# Patient Record
Sex: Male | Born: 1965
Health system: Southern US, Community
[De-identification: ages and names within clinical notes are randomized; demographics above are authoritative.]

## PROBLEM LIST (undated history)

## (undated) DIAGNOSIS — E66812 Obesity, class 2: Secondary | ICD-10-CM

## (undated) DIAGNOSIS — K429 Umbilical hernia without obstruction or gangrene: Secondary | ICD-10-CM

## (undated) DIAGNOSIS — H269 Unspecified cataract: Secondary | ICD-10-CM

## (undated) DIAGNOSIS — M1990 Unspecified osteoarthritis, unspecified site: Secondary | ICD-10-CM

## (undated) DIAGNOSIS — F329 Major depressive disorder, single episode, unspecified: Secondary | ICD-10-CM

## (undated) DIAGNOSIS — Z8489 Family history of other specified conditions: Secondary | ICD-10-CM

## (undated) DIAGNOSIS — F32A Depression, unspecified: Secondary | ICD-10-CM

## (undated) DIAGNOSIS — E7211 Homocystinuria: Secondary | ICD-10-CM

## (undated) DIAGNOSIS — H279 Unspecified disorder of lens: Secondary | ICD-10-CM

## (undated) DIAGNOSIS — M722 Plantar fascial fibromatosis: Secondary | ICD-10-CM

## (undated) DIAGNOSIS — M8700 Idiopathic aseptic necrosis of unspecified bone: Secondary | ICD-10-CM

## (undated) DIAGNOSIS — R202 Paresthesia of skin: Secondary | ICD-10-CM

## (undated) DIAGNOSIS — R0902 Hypoxemia: Secondary | ICD-10-CM

## (undated) DIAGNOSIS — F429 Obsessive-compulsive disorder, unspecified: Secondary | ICD-10-CM

## (undated) DIAGNOSIS — I639 Cerebral infarction, unspecified: Secondary | ICD-10-CM

## (undated) DIAGNOSIS — G709 Myoneural disorder, unspecified: Secondary | ICD-10-CM

## (undated) DIAGNOSIS — I10 Essential (primary) hypertension: Secondary | ICD-10-CM

## (undated) DIAGNOSIS — K219 Gastro-esophageal reflux disease without esophagitis: Secondary | ICD-10-CM

## (undated) DIAGNOSIS — G4730 Sleep apnea, unspecified: Secondary | ICD-10-CM

## (undated) DIAGNOSIS — E669 Obesity, unspecified: Secondary | ICD-10-CM

## (undated) DIAGNOSIS — I619 Nontraumatic intracerebral hemorrhage, unspecified: Secondary | ICD-10-CM

## (undated) DIAGNOSIS — F419 Anxiety disorder, unspecified: Secondary | ICD-10-CM

## (undated) DIAGNOSIS — G603 Idiopathic progressive neuropathy: Secondary | ICD-10-CM

## (undated) HISTORY — PX: JOINT REPLACEMENT: SHX530

## (undated) HISTORY — DX: Plantar fascial fibromatosis: M72.2

## (undated) HISTORY — PX: WRIST SURGERY: SHX841

## (undated) HISTORY — DX: Idiopathic progressive neuropathy: G60.3

## (undated) HISTORY — PX: DERMABRASION OF FACE: SUR411

## (undated) HISTORY — DX: Paresthesia of skin: R20.2

## (undated) HISTORY — DX: Idiopathic aseptic necrosis of unspecified bone: M87.00

## (undated) HISTORY — DX: Unspecified cataract: H26.9

## (undated) HISTORY — DX: Hypoxemia: R09.02

## (undated) HISTORY — DX: Obesity, class 2: E66.812

## (undated) HISTORY — PX: WRIST FUSION: SHX839

## (undated) HISTORY — PX: EYE SURGERY: SHX253

## (undated) HISTORY — PX: INTRAOCULAR LENS INSERTION: SHX110

## (undated) HISTORY — PX: HIP ARTHROPLASTY: SHX981

## (undated) HISTORY — DX: Obesity, unspecified: E66.9

---

## 1998-08-03 ENCOUNTER — Encounter: Admission: RE | Admit: 1998-08-03 | Discharge: 1998-08-03 | Payer: Self-pay | Admitting: *Deleted

## 2003-12-31 ENCOUNTER — Encounter: Admission: RE | Admit: 2003-12-31 | Discharge: 2003-12-31 | Payer: Self-pay | Admitting: Internal Medicine

## 2005-01-12 ENCOUNTER — Encounter: Admission: RE | Admit: 2005-01-12 | Discharge: 2005-01-12 | Payer: Self-pay | Admitting: Internal Medicine

## 2005-03-17 ENCOUNTER — Ambulatory Visit (HOSPITAL_COMMUNITY): Admission: RE | Admit: 2005-03-17 | Discharge: 2005-03-17 | Payer: Self-pay | Admitting: Internal Medicine

## 2005-03-23 ENCOUNTER — Ambulatory Visit (HOSPITAL_COMMUNITY): Admission: RE | Admit: 2005-03-23 | Discharge: 2005-03-23 | Payer: Self-pay | Admitting: Internal Medicine

## 2006-07-09 ENCOUNTER — Emergency Department (HOSPITAL_COMMUNITY): Admission: EM | Admit: 2006-07-09 | Discharge: 2006-07-09 | Payer: Self-pay | Admitting: Emergency Medicine

## 2007-01-12 ENCOUNTER — Emergency Department (HOSPITAL_COMMUNITY): Admission: EM | Admit: 2007-01-12 | Discharge: 2007-01-12 | Payer: Self-pay | Admitting: Family Medicine

## 2007-03-23 ENCOUNTER — Ambulatory Visit: Payer: Self-pay | Admitting: Internal Medicine

## 2007-04-19 ENCOUNTER — Ambulatory Visit: Payer: Self-pay | Admitting: Internal Medicine

## 2007-04-19 LAB — CONVERTED CEMR LAB
Albumin: 4.1 g/dL (ref 3.5–5.2)
Basophils Absolute: 0 10*3/uL (ref 0.0–0.1)
Bilirubin, Direct: 0.1 mg/dL (ref 0.0–0.3)
Chloride: 98 meq/L (ref 96–112)
Cholesterol: 187 mg/dL (ref 0–200)
Eosinophils Absolute: 0 10*3/uL (ref 0.0–0.6)
Eosinophils Relative: 1.1 % (ref 0.0–5.0)
GFR calc Af Amer: 106 mL/min
GFR calc non Af Amer: 88 mL/min
Glucose, Bld: 99 mg/dL (ref 70–99)
HCT: 43.3 % (ref 39.0–52.0)
Homocysteine: 30.1 micromoles/L — ABNORMAL HIGH (ref 5.00–13.90)
Lymphocytes Relative: 27.8 % (ref 12.0–46.0)
MCHC: 34.3 g/dL (ref 30.0–36.0)
MCV: 92.6 fL (ref 78.0–100.0)
Monocytes Absolute: 0.4 10*3/uL (ref 0.2–0.7)
Neutro Abs: 2.8 10*3/uL (ref 1.4–7.7)
Neutrophils Relative %: 61.9 % (ref 43.0–77.0)
Potassium: 4.1 meq/L (ref 3.5–5.1)
RBC: 4.68 M/uL (ref 4.22–5.81)
Sodium: 134 meq/L — ABNORMAL LOW (ref 135–145)
TSH: 0.75 microintl units/mL (ref 0.35–5.50)
Total CHOL/HDL Ratio: 4.7

## 2007-11-14 ENCOUNTER — Emergency Department (HOSPITAL_COMMUNITY): Admission: EM | Admit: 2007-11-14 | Discharge: 2007-11-14 | Payer: Self-pay | Admitting: Emergency Medicine

## 2008-04-10 ENCOUNTER — Telehealth (INDEPENDENT_AMBULATORY_CARE_PROVIDER_SITE_OTHER): Payer: Self-pay | Admitting: *Deleted

## 2008-05-19 ENCOUNTER — Emergency Department: Payer: Self-pay | Admitting: Emergency Medicine

## 2008-05-28 ENCOUNTER — Ambulatory Visit: Payer: Self-pay | Admitting: Internal Medicine

## 2008-05-28 ENCOUNTER — Telehealth: Payer: Self-pay | Admitting: Gastroenterology

## 2008-05-28 DIAGNOSIS — F411 Generalized anxiety disorder: Secondary | ICD-10-CM | POA: Insufficient documentation

## 2008-05-28 DIAGNOSIS — F429 Obsessive-compulsive disorder, unspecified: Secondary | ICD-10-CM | POA: Insufficient documentation

## 2008-05-28 DIAGNOSIS — E785 Hyperlipidemia, unspecified: Secondary | ICD-10-CM | POA: Insufficient documentation

## 2008-05-28 DIAGNOSIS — E7210 Disorders of sulfur-bearing amino-acid metabolism, unspecified: Secondary | ICD-10-CM | POA: Insufficient documentation

## 2008-05-28 DIAGNOSIS — E782 Mixed hyperlipidemia: Secondary | ICD-10-CM | POA: Insufficient documentation

## 2008-05-29 ENCOUNTER — Telehealth (INDEPENDENT_AMBULATORY_CARE_PROVIDER_SITE_OTHER): Payer: Self-pay | Admitting: *Deleted

## 2008-05-30 ENCOUNTER — Encounter: Payer: Self-pay | Admitting: Internal Medicine

## 2008-05-30 ENCOUNTER — Encounter (INDEPENDENT_AMBULATORY_CARE_PROVIDER_SITE_OTHER): Payer: Self-pay | Admitting: *Deleted

## 2008-05-30 LAB — CONVERTED CEMR LAB
BUN: 10 mg/dL (ref 6–23)
Basophils Absolute: 0 10*3/uL (ref 0.0–0.1)
Basophils Relative: 0.2 % (ref 0.0–1.0)
CO2: 31 meq/L (ref 19–32)
Calcium: 9.7 mg/dL (ref 8.4–10.5)
Chloride: 102 meq/L (ref 96–112)
Cholesterol: 165 mg/dL (ref 0–200)
Creatinine, Ser: 1.1 mg/dL (ref 0.4–1.5)
Eosinophils Absolute: 0 10*3/uL (ref 0.0–0.7)
Eosinophils Relative: 0.5 % (ref 0.0–5.0)
GFR calc non Af Amer: 78 mL/min
HDL: 47.3 mg/dL (ref >39.0)
Hemoglobin: 14.8 g/dL (ref 13.0–17.0)
MCHC: 35.6 g/dL (ref 30.0–36.0)
MCV: 93.1 fL (ref 78.0–100.0)
Neutro Abs: 3.5 10*3/uL (ref 1.4–7.7)
Neutrophils Relative %: 64.3 % (ref 43.0–77.0)
RBC: 4.47 M/uL (ref 4.22–5.81)
Total CHOL/HDL Ratio: 3.5
Triglycerides: 84 mg/dL (ref 0–149)
WBC: 5.4 10*3/uL (ref 4.5–10.5)

## 2008-06-03 ENCOUNTER — Encounter: Payer: Self-pay | Admitting: Physician Assistant

## 2008-06-03 ENCOUNTER — Ambulatory Visit: Payer: Self-pay | Admitting: Gastroenterology

## 2008-06-03 ENCOUNTER — Telehealth (INDEPENDENT_AMBULATORY_CARE_PROVIDER_SITE_OTHER): Payer: Self-pay | Admitting: *Deleted

## 2008-06-09 ENCOUNTER — Encounter (INDEPENDENT_AMBULATORY_CARE_PROVIDER_SITE_OTHER): Payer: Self-pay | Admitting: *Deleted

## 2008-06-09 ENCOUNTER — Telehealth: Payer: Self-pay | Admitting: Gastroenterology

## 2008-06-12 LAB — CONVERTED CEMR LAB: Tissue Transglutaminase Ab, IgA: 0.9 units (ref <7)

## 2008-06-23 ENCOUNTER — Ambulatory Visit: Payer: Self-pay | Admitting: Gastroenterology

## 2008-06-23 ENCOUNTER — Encounter: Payer: Self-pay | Admitting: Gastroenterology

## 2008-06-25 ENCOUNTER — Encounter: Payer: Self-pay | Admitting: Gastroenterology

## 2008-06-26 ENCOUNTER — Telehealth: Payer: Self-pay | Admitting: Gastroenterology

## 2008-10-07 ENCOUNTER — Telehealth (INDEPENDENT_AMBULATORY_CARE_PROVIDER_SITE_OTHER): Payer: Self-pay | Admitting: *Deleted

## 2009-07-03 DIAGNOSIS — F329 Major depressive disorder, single episode, unspecified: Secondary | ICD-10-CM | POA: Insufficient documentation

## 2009-12-05 HISTORY — PX: OTHER SURGICAL HISTORY: SHX169

## 2010-12-05 HISTORY — PX: UPPER GASTROINTESTINAL ENDOSCOPY: SHX188

## 2011-04-22 NOTE — Assessment & Plan Note (Signed)
[facility]                        [place] OFFICE NOTE   ABDOULAYE, DRUM                          MRN:          [id number]  DATE:03/23/2007                            DOB:          1966-08-09    Mr. Bazzi was seen to establish as a new patient, March 23, 2007.   He has a very complex past medical history; specifically, he was  diagnosed with homocystinuria in 1996, at the time of a cerebrovascular  event.  In 1992, he had bilateral lens implants.  At age 45, due to the  ophthalmologic issues, Marfan's was questioned, but subsequently  disproved.  Chanetta Marshall is followed by Maryjo Rochester, M.D., [facility] [place].  He has also been evaluated  by Lesia Sago, a neurologist.   He also has a history of dyslipidemia, is on Lipitor.   FAMILY HISTORY:  Includes lung cancer in paternal grandfather; he was a  smoker and also had a stroke.  Father and mother both had hypertension,  as did paternal grandmother and maternal grandmother.  Both grandmothers  had strokes, as well.   He has never smoked and drinks alcohol minimally.  He has joined an  exercise facility.  He is on a low-protein diet, but admits that his  compliance is suboptimal.   Other medications include:  1. Betaine 21 g daily.  2. B6 500 mg daily.  3. B12 100 micrograms daily.  4. Folate 1 mg daily.  5. Baby aspirin.  6. Celexa 40.  7. Lipitor 10; he has been out for four days.   He has GI intolerance to AMOXICILLIN.   REVIEW OF SYSTEMS:  Essentially negative.  Specifically, he has no  paresthesias, weakness or other neurologic symptoms at this time.   He states that his lipids have been well-controlled on low-dose Lipitor.   He has no cardiopulmonary symptoms with the exercise.   He is 6 feet 3 and weighs 240, fully clothed.  Pulse was 64, respiratory  rate 16 and blood pressure 110/80.  The lenses are clear.  He has mild  arteriolar narrowing.  There is dramatic deviation of the nasal septum  with almost total occlusion of the right naris.  Dental hygiene is good.  Otolaryngologic exam is otherwise unremarkable.   Thyroid is normal to palpation.   He has no murmurs or gallops and all pulses are intact.  Chest is clear  to auscultation.   He has no organomegaly or masses.   Genitourinary exam is negative.  Hemoccult testing is negative.   He has mild crepitus of his knees.  I can appreciate no musculoskeletal  abnormalities, otherwise.  He has no hyperextensibility.   Neuropsychiatric exam reveals no deficits.   His EKG reveals early repolarization changes.  Unfortunately, the prior  EKGs reveal low voltage and are difficult to compare.   If he has no difficulty with cardiovascular exercise 30-45 minutes three  to four times a week, we will simply monitor the EKG annually.  Should  he have any symptoms, then a nuclear stress test would  be pursued.   The Lipitor will be renewed and fasting labs checked, once he has been  back on the medication for at least six weeks, for optimalassessment. of  the drug's efficacy.   A copy of this will be sent to Dr. Kandis Cocking at [facility], [facility] [address] number [id number],  [place] [address] (phone number [phone]/fax [phone]).     Titus Dubin. Alwyn Ren, MD,FACP,FCCP  Electronically Signed    [facility]  DD: 03/23/2007  DT: 03/23/2007  Job #: [id number]   cc:   Clinic, [place], [place] [address] Dr. Maryjo [facility]

## 2013-05-15 ENCOUNTER — Emergency Department: Payer: Self-pay

## 2013-05-15 LAB — URINALYSIS, COMPLETE
Blood: NEGATIVE
Ketone: NEGATIVE
Ph: 8 (ref 4.5–8.0)
Protein: NEGATIVE
RBC,UR: NONE SEEN /HPF (ref 0–5)
Squamous Epithelial: NONE SEEN
WBC UR: 1 /HPF (ref 0–5)

## 2013-05-15 LAB — COMPREHENSIVE METABOLIC PANEL
Albumin: 3.7 g/dL (ref 3.4–5.0)
Alkaline Phosphatase: 48 U/L — ABNORMAL LOW (ref 50–136)
Creatinine: 1.03 mg/dL (ref 0.60–1.30)
EGFR (African American): >60
EGFR (Non-African Amer.): >60
SGOT(AST): 39 U/L — ABNORMAL HIGH (ref 15–37)
SGPT (ALT): 56 U/L (ref 12–78)
Total Protein: 8.3 g/dL — ABNORMAL HIGH (ref 6.4–8.2)

## 2013-05-15 LAB — CBC
HCT: 43.5 % (ref 40.0–52.0)
HGB: 15.1 g/dL (ref 13.0–18.0)
MCH: 31.9 pg (ref 26.0–34.0)
MCV: 92 fL (ref 80–100)
Platelet: 210 10*3/uL (ref 150–440)

## 2013-05-15 LAB — TSH: Thyroid Stimulating Horm: 0.979 u[IU]/mL

## 2013-05-22 ENCOUNTER — Encounter (HOSPITAL_COMMUNITY): Payer: Self-pay | Admitting: *Deleted

## 2013-05-22 DIAGNOSIS — M545 Low back pain, unspecified: Secondary | ICD-10-CM | POA: Insufficient documentation

## 2013-05-22 DIAGNOSIS — R209 Unspecified disturbances of skin sensation: Secondary | ICD-10-CM | POA: Insufficient documentation

## 2013-05-22 DIAGNOSIS — Z8669 Personal history of other diseases of the nervous system and sense organs: Secondary | ICD-10-CM | POA: Insufficient documentation

## 2013-05-22 DIAGNOSIS — IMO0002 Reserved for concepts with insufficient information to code with codable children: Secondary | ICD-10-CM | POA: Insufficient documentation

## 2013-05-22 DIAGNOSIS — G8929 Other chronic pain: Secondary | ICD-10-CM | POA: Insufficient documentation

## 2013-05-22 DIAGNOSIS — R5381 Other malaise: Secondary | ICD-10-CM | POA: Insufficient documentation

## 2013-05-22 DIAGNOSIS — Z8679 Personal history of other diseases of the circulatory system: Secondary | ICD-10-CM | POA: Insufficient documentation

## 2013-05-22 NOTE — ED Notes (Signed)
The pt has had back pain for 3 weeks he has seen a doctor but the percocet and the valium is not helping.  He also reports pain  Going down his rt leg and he has had some bowel problems

## 2013-05-23 ENCOUNTER — Emergency Department (HOSPITAL_COMMUNITY): Payer: Self-pay

## 2013-05-23 ENCOUNTER — Emergency Department (HOSPITAL_COMMUNITY)
Admission: EM | Admit: 2013-05-23 | Discharge: 2013-05-23 | Disposition: A | Discharge status: Home / Self Care | Payer: Self-pay | Attending: Emergency Medicine | Admitting: Emergency Medicine

## 2013-05-23 DIAGNOSIS — M5410 Radiculopathy, site unspecified: Secondary | ICD-10-CM

## 2013-05-23 DIAGNOSIS — G8929 Other chronic pain: Secondary | ICD-10-CM

## 2013-05-23 HISTORY — DX: Nontraumatic intracerebral hemorrhage, unspecified: I61.9

## 2013-05-23 HISTORY — DX: Unspecified disorder of lens: H27.9

## 2013-05-23 LAB — BASIC METABOLIC PANEL
BUN: 14 mg/dL (ref 6–23)
CO2: 27 mEq/L (ref 19–32)
Chloride: 104 mEq/L (ref 96–112)
Creatinine, Ser: 1.02 mg/dL (ref 0.50–1.35)
Glucose, Bld: 84 mg/dL (ref 70–99)

## 2013-05-23 LAB — CBC WITH DIFFERENTIAL/PLATELET
Eosinophils Relative: 2 % (ref 0–5)
HCT: 37.2 % — ABNORMAL LOW (ref 39.0–52.0)
Hemoglobin: 12.7 g/dL — ABNORMAL LOW (ref 13.0–17.0)
Lymphocytes Relative: 39 % (ref 12–46)
Lymphs Abs: 2.2 10*3/uL (ref 0.7–4.0)
MCV: 91.6 fL (ref 78.0–100.0)
Monocytes Absolute: 0.7 10*3/uL (ref 0.1–1.0)
Monocytes Relative: 13 % — ABNORMAL HIGH (ref 3–12)
Neutro Abs: 2.6 10*3/uL (ref 1.7–7.7)
WBC: 5.6 10*3/uL (ref 4.0–10.5)

## 2013-05-23 LAB — OCCULT BLOOD, POC DEVICE: Fecal Occult Bld: NEGATIVE

## 2013-05-23 MED ORDER — HYDROMORPHONE HCL PF 1 MG/ML IJ SOLN
1.0000 mg | Freq: Once | INTRAMUSCULAR | Status: AC
  Administered 2013-05-23: 1 mg via INTRAVENOUS
  Filled 2013-05-23: qty 1

## 2013-05-23 MED ORDER — DIAZEPAM 5 MG PO TABS: 5.0000 mg | ORAL_TABLET | Freq: Two times a day (BID) | ORAL | Status: DC

## 2013-05-23 MED ORDER — GADOBENATE DIMEGLUMINE 529 MG/ML IV SOLN
20.0000 mL | Freq: Once | INTRAVENOUS | Status: AC
  Administered 2013-05-23: 20 mL via INTRAVENOUS

## 2013-05-23 MED ORDER — OXYCODONE-ACETAMINOPHEN 5-325 MG PO TABS: 1.0000 | ORAL_TABLET | ORAL | Status: DC | PRN

## 2013-05-23 MED ORDER — SODIUM CHLORIDE 0.9 % IV BOLUS (SEPSIS)
1000.0000 mL | Freq: Once | INTRAVENOUS | Status: AC
  Administered 2013-05-23: 1000 mL via INTRAVENOUS

## 2013-05-23 MED ORDER — KETAMINE HCL 10 MG/ML IJ SOLN
11.0000 mg | Freq: Once | INTRAMUSCULAR | Status: AC
  Administered 2013-05-23: 11 mg via INTRAVENOUS
  Filled 2013-05-23: qty 1.1

## 2013-05-23 MED ORDER — SODIUM CHLORIDE 0.9 % IV BOLUS (SEPSIS): 1000.0000 mL | Freq: Once | INTRAVENOUS | Status: DC

## 2013-05-23 MED ORDER — DIAZEPAM 5 MG/ML IJ SOLN
2.5000 mg | Freq: Once | INTRAMUSCULAR | Status: AC
  Administered 2013-05-23: 2.5 mg via INTRAVENOUS
  Filled 2013-05-23: qty 2

## 2013-05-23 MED ORDER — ONDANSETRON HCL 4 MG/2ML IJ SOLN
4.0000 mg | Freq: Once | INTRAMUSCULAR | Status: AC
  Administered 2013-05-23: 4 mg via INTRAVENOUS
  Filled 2013-05-23: qty 2

## 2013-05-23 NOTE — ED Provider Notes (Signed)
History     CSN: [id number]  Arrival date & time 05/22/13  2318   First MD Initiated Contact with Patient 05/23/13 0012      Chief Complaint  Patient presents with  . Back Pain   HPI MATYAS BAISLEY is a 47 y.o. male with a past medical history significant for low back pain who informs me that 15-20 years ago he had an MRI showing some S2 nerve damage, presents to the ER with worsening low back pain. About 3 weeks ago he presented to the ER with back pain is not exacerbated by any injury, no trauma, he says his pain is in the right lower back, radiates down the right leg to his heel, has been associated with some weakness numbness and tingling of the right foot, and is also concerned because he's been "dribbling" urine before, denies any urinary retention, or fecal incontinence. Denies saddle anesthesia. Pain is currently an 8-9/10, aching.  Patient says he does have some chronic diarrhea from the medicine he takes for his homocystinuria. Patient has no history of prostate problems at present, no family history of prostate problems. Patient has had no fevers, chills, denies illicit drugs, alcohol, denies IV drug use no night sweats, or recent weight loss. Patient is had no relief with Percocet and Valium this evening.   Past Medical History  Diagnosis Date  . Lens disease   . Brain bleed     History reviewed. No pertinent past surgical history.  No family history on file.  History  Substance Use Topics  . Smoking status: Never Smoker   . Smokeless tobacco: Not on file  . Alcohol Use: No      Review of Systems At least 10pt or greater review of systems completed and are negative except where specified in the HPI.  Allergies  Pineapple and Amoxicillin  Home Medications  No current outpatient prescriptions on file.  BP 133/74  Pulse 135  Temp(Src) 98.4 F (36.9 C) (Oral)  Resp 95  SpO2 95%  Physical Exam  PHYSICAL EXAM: VITAL SIGNS:  . Filed Vitals:   05/22/13 2323   BP: 133/74  Pulse: 135  Temp: 98.4 F (36.9 C)  TempSrc: Oral  Resp: 95  SpO2: 95%   CONSTITUTIONAL: Awake, oriented, appears non-toxic HENT: Atraumatic, normocephalic, oral mucosa pink and moist, airway patent. Nares patent without drainage. External ears normal. EYES: Conjunctiva clear, EOMI, PERRLA NECK: Trachea midline, non-tender, supple CARDIOVASCULAR: Normal heart rate, Normal rhythm, No murmurs, rubs, gallops PULMONARY/CHEST: Clear to auscultation, no rhonchi, wheezes, or rales. Symmetrical breath sounds. CHEST WALL: No lesions. Non-tender. ABDOMINAL: Non-distended, soft, non-tender - no rebound or guarding.  BS normal. RECTAL: Normal sphincter tone, no gross blood, prostate enlarged without nodules or tenderness - could not appreciate the anterior third of the prostate secondary to body habitus. BACK: No step-offs or deformities, nontender to palpation in the midline, no skin abnormalities. NEUROLOGIC: JY:NWGNFA fields intact. PERRLA, EOMI.  Facial sensation equal to light touch bilaterally.  Good muscle bulk in the masseter muscle and good lateral movement of the jaw.  Facial expressions equal and good strength with smile/frown and puffed cheeks.  Hearing grossly intact to finger rub test.  Uvula, tongue are midline with no deviation. Symmetrical palate elevation.  Trapezius and SCM muscles are 5/5 strength bilaterally.   DTR: Brachioradialis, biceps, patellar, Achilles tendon reflexes 2+ bilaterally.  2 beats clonus bilaterally. Strength: 5/5 strength flexors and extensors in the upper and lower extremities.  Grip strength, finger  adduction/abduction 5/5. Sensation: Sensation intact distally to light touch Gait and Station: Normal heel/toe, and tandem gait.  Negative Romberg, no pronator drift EXTREMITIES: No clubbing, cyanosis, or edema SKIN: Warm, Dry, No erythema, No rash   ED Course  Procedures (including critical care time)  Labs Reviewed  CBC WITH DIFFERENTIAL -  Abnormal; Notable for the following:    RBC 4.06 (*)    Hemoglobin 12.7 (*)    HCT 37.2 (*)    Monocytes Relative 13 (*)    All other components within normal limits  BASIC METABOLIC PANEL - Abnormal; Notable for the following:    Potassium 3.3 (*)    Calcium 8.0 (*)    GFR calc non Af Amer 86 (*)    All other components within normal limits  OCCULT BLOOD, POC DEVICE   Dg Lumbar Spine 2-3 Views  05/23/2013   *RADIOLOGY REPORT*  Clinical Data: Low back pain.  LUMBAR SPINE - 2-3 VIEW  Comparison: Lumbar spine MRI 01/12/2005.  Findings: Three views of the lumbar spine demonstrate no definite acute displaced fracture or compression type fracture.  Alignment is anatomic.  Multilevel degenerative disc disease and facet arthropathy, most severe at L5-S1.  IMPRESSION: 1.  No acute radiographic abnormality of the lumbar spine. 2.  Multilevel degenerative disc disease and lumbar spondylosis, most severe at L5-S1.   Original Report Authenticated By: Trudie Reed, M.D.   Mr Lumbar Spine W Wo Contrast  05/23/2013   *RADIOLOGY REPORT*  Clinical Data: Low back and right leg pain with bladder symptoms.  MRI LUMBAR SPINE WITHOUT AND WITH CONTRAST  Technique:  Multiplanar and multiecho pulse sequences of the lumbar spine were obtained without and with intravenous contrast.  Contrast: 20mL MULTIHANCE GADOBENATE DIMEGLUMINE 529 MG/ML IV SOLN  Comparison: Lumbar MRI January 12, 2005.  Lumbar spine radiographs 05/23/2013.  Findings: There is chronic disc and endplate degeneration at L5-S1 which appears mildly progressive.  There is no evidence of acute fracture or pars defect.  The alignment is anatomic.  The conus medullaris extends to the L2 level and appears normal. There is no abnormal intradural enhancement.  There are no paraspinal abnormalities.  There is minimal disc desiccation at the L1-L2 and L2-L3 levels. However, from T12-T1 through L3-L4, there is no disc herniation, spinal stenosis or nerve root  encroachment.  L4-L5:  Mild annular disc bulging is stable.  There is mildly progressive facet and ligamentous hypertrophy with small bilateral facet joint effusions.  Minimal narrowing of the lateral recesses is stable.  There is no foraminal compromise or exiting nerve root encroachment.  L5-S1: There is chronic degenerative disc disease with progressive loss of disc height, annular bulging and paraspinal osteophytes. Mild facet and ligamentous hypertrophy bilaterally is unchanged. Likewise, the mild right and moderate left foraminal stenosis is similar to the prior examination.  There is possible encroachment on either exiting L5 nerve root.  No S1 nerve root displacement is identified.  IMPRESSION:  1.  Mildly progressive chronic degenerative disc disease at L5-S1 with associated chronic left greater than right foraminal stenosis. There is possible chronic L5 nerve root encroachment. 2.  No other significant spinal stenosis or nerve root encroachment. 3.  No acute findings are identified to explain the patient's radicular symptoms. 4.  Mildly progressive facet joint effusions bilaterally at L4-L5.   Original Report Authenticated By: Carey Bullocks, M.D.     1. Chronic back pain   2. Radiculopathy of leg       MDM  Physical exam  reveals no deficit. Patient's straight leg test is negative bilaterally. When I asked this patient to turn on his side for a rectal exam to check his rectal tone and prostate, patient promptly flipped around very quickly onto his abdomen from his back. With the EEGs that this patient flipped over and lack of discomfort, I question the possibility that this could be or a quiet syndrome. Patient's urinary dribbling seems likely to be caused by his enlarged prostate. Patient has no hyperreflexia in the right lower extremity, he has no weakness in the right lower extremity and a negative straight leg test in the right leg.  Patient is still having pain despite medication, he  still concerned there is something going on in his back, x-rays show multilevel degenerative disc disease and lumbar spondylosis most severe at L5-S1.  Is difficult to discern with the patient's urinary dribbling whether or not he actually has cord compression at this time or not. His physical exam is not convincing however the patient's complaints of weakness of the right leg are concerning since they're new. We'll place the patient in pod C to await urgent MRI done tomorrow morning.  Patient's pain has been well-controlled all evening, he received another dose of medication in the morning. Patient's MRI returned with no acute findings to explain patient's radicular symptoms, specifically no nerve root encroachment or significant spinal stenosis. I. think this correlates well with the patient's physical exam, and my clinical gestalt patient does not have a cauda equina syndrome or compression syndrome.  Think the patient's dribbling is likely due to same BPH. I've referred the patient back to his primary care physician to discuss therapy for that. I told him if his back pain continues after he sees his primary care physician I would also refer him to Dr. Venetia Maxon.  No evidence for infection, osteomyelitis, epidural abscess.  At this point patient likely has an acute exacerbation of chronic back pain, we'll extend his pain medications, he can return to his primary care physician for assessment. Also instructed him that he will need to maintain activity levels, and that physical therapy may help him out.         Jones Skene, MD 05/24/13 9528

## 2013-05-23 NOTE — ED Notes (Signed)
MD at bedside. 

## 2013-05-23 NOTE — ED Notes (Signed)
Report received, assumed care.  

## 2013-05-26 ENCOUNTER — Emergency Department: Payer: Self-pay | Admitting: Emergency Medicine

## 2013-06-12 ENCOUNTER — Encounter (HOSPITAL_COMMUNITY): Payer: Self-pay | Admitting: Emergency Medicine

## 2013-06-12 DIAGNOSIS — Z8669 Personal history of other diseases of the nervous system and sense organs: Secondary | ICD-10-CM | POA: Insufficient documentation

## 2013-06-12 DIAGNOSIS — M545 Low back pain, unspecified: Secondary | ICD-10-CM | POA: Insufficient documentation

## 2013-06-12 DIAGNOSIS — Z79899 Other long term (current) drug therapy: Secondary | ICD-10-CM | POA: Insufficient documentation

## 2013-06-12 DIAGNOSIS — Z8679 Personal history of other diseases of the circulatory system: Secondary | ICD-10-CM | POA: Insufficient documentation

## 2013-06-12 DIAGNOSIS — G8929 Other chronic pain: Secondary | ICD-10-CM | POA: Insufficient documentation

## 2013-06-12 NOTE — ED Notes (Signed)
PT. REPORTS CHRONIC BACK PAIN RADIATING TO RIGHT LEG FOR 6 WEEKS , DENIES INJURY OR FALL , NO URINARY SYMPTOMS , AMBULATORY.

## 2013-06-13 ENCOUNTER — Emergency Department (HOSPITAL_COMMUNITY)
Admission: EM | Admit: 2013-06-13 | Discharge: 2013-06-13 | Disposition: A | Discharge status: Home / Self Care | Payer: Self-pay | Attending: Emergency Medicine | Admitting: Emergency Medicine

## 2013-06-13 DIAGNOSIS — M549 Dorsalgia, unspecified: Secondary | ICD-10-CM

## 2013-06-13 DIAGNOSIS — G8929 Other chronic pain: Secondary | ICD-10-CM

## 2013-06-13 MED ORDER — HYDROMORPHONE HCL PF 1 MG/ML IJ SOLN
1.0000 mg | Freq: Once | INTRAMUSCULAR | Status: AC
  Administered 2013-06-13: 1 mg via INTRAMUSCULAR
  Filled 2013-06-13: qty 1

## 2013-06-13 MED ORDER — DIAZEPAM 5 MG PO TABS
5.0000 mg | ORAL_TABLET | Freq: Once | ORAL | Status: AC
  Administered 2013-06-13: 5 mg via ORAL
  Filled 2013-06-13: qty 1

## 2013-06-13 MED ORDER — OXYCODONE-ACETAMINOPHEN 5-325 MG PO TABS: 1.0000 | ORAL_TABLET | Freq: Four times a day (QID) | ORAL | Status: DC | PRN

## 2013-06-13 MED ORDER — PREDNISONE 20 MG PO TABS: 40.0000 mg | ORAL_TABLET | Freq: Every day | ORAL | Status: DC

## 2013-06-13 MED ORDER — DIAZEPAM 5 MG PO TABS
5.0000 mg | ORAL_TABLET | Freq: Two times a day (BID) | ORAL | Status: DC
Start: 2013-06-13 — End: 2013-06-13

## 2013-06-13 MED ORDER — DIAZEPAM 5 MG PO TABS: 5.0000 mg | ORAL_TABLET | Freq: Two times a day (BID) | ORAL | Status: DC

## 2013-06-13 MED ORDER — DEXAMETHASONE SODIUM PHOSPHATE 10 MG/ML IJ SOLN
10.0000 mg | Freq: Once | INTRAMUSCULAR | Status: AC
  Administered 2013-06-13: 10 mg via INTRAMUSCULAR
  Filled 2013-06-13: qty 1

## 2013-06-13 NOTE — ED Provider Notes (Signed)
History    CSN: [id number] Arrival date & time 06/12/13  2334  First MD Initiated Contact with Patient 06/13/13 [phone]     Chief Complaint  Patient presents with  . Back Pain   (Consider location/radiation/quality/duration/timing/severity/associated sxs/prior Treatment) HPI Comments: Patient is a 47 year old male who presents for pain to his low back x6 weeks. Patient states the pain is sharp in nature, constant, waxing and waning in severity, worse when standing still for long period or heavy lifting and improved with movement, valium, and percocet. Patient endorses follow up with neurosurgeon on 06/28/13. He denies bowel/bladder incontinence, saddle anesthesia, new trauma or falls, and extremity weakness. Patient ambulatory at baseline. Patient denies change in symptoms since last seen on 05/23/13.  Patient is a 47 y.o. male presenting with back pain. The history is provided by the patient. No language interpreter was used.  Back Pain Associated symptoms: no fever, no numbness and no weakness    Past Medical History  Diagnosis Date  . Lens disease   . Brain bleed    History reviewed. No pertinent past surgical history. No family history on file. History  Substance Use Topics  . Smoking status: Never Smoker   . Smokeless tobacco: Not on file  . Alcohol Use: No    Review of Systems  Constitutional: Negative for fever.  Musculoskeletal: Positive for back pain. Negative for gait problem.  Skin: Negative for pallor.  Neurological: Negative for weakness and numbness.  All other systems reviewed and are negative.   Allergies  Pineapple and Amoxicillin  Home Medications   Current Outpatient Rx  Name  Route  Sig  Dispense  Refill  . ALPRAZolam (XANAX) 0.5 MG tablet   Oral   Take 0.5 mg by mouth 3 (three) times daily.         Marland Kitchen amLODipine (NORVASC) 5 MG tablet   Oral   Take 5 mg by mouth 2 (two) times daily.         . ARIPiprazole (ABILIFY) 5 MG tablet   Oral   Take  5 mg by mouth daily.         Marland Kitchen aspirin 81 MG chewable tablet   Oral   Chew 81 mg by mouth daily.         . Betaine (CYSTADANE) POWD   Oral   Take 7 scoop by mouth 3 (three) times daily. Mix with 4-6 ounces of water For Homocysteneria         . diazepam (VALIUM) 5 MG tablet   Oral   Take 5-10 mg by mouth every 8 (eight) hours as needed (back spasms).         . diazepam (VALIUM) 5 MG tablet   Oral   Take 1 tablet (5 mg total) by mouth 2 (two) times daily.   15 tablet   0   . Fluvoxamine Maleate 150 MG CP24   Oral   Take 2 capsules by mouth at bedtime.         . folic acid (FOLVITE) 400 MCG tablet   Oral   Take 1,200 mcg by mouth daily.         Marland Kitchen losartan-hydrochlorothiazide (HYZAAR) 50-12.5 MG per tablet   Oral   Take 1 tablet by mouth daily.         Marland Kitchen oxycodone (OXY-IR) 5 MG capsule   Oral   Take 5-10 mg by mouth every 4 (four) hours as needed for pain.         Marland Kitchen  oxyCODONE-acetaminophen (PERCOCET/ROXICET) 5-325 MG per tablet   Oral   Take 1-2 tablets by mouth every 6 (six) hours as needed for pain.   15 tablet   0   . predniSONE (DELTASONE) 20 MG tablet   Oral   Take 2 tablets (40 mg total) by mouth daily.   10 tablet   0   . Pyridoxine HCl (VITAMIN B-6) 500 MG tablet   Oral   Take 500 mg by mouth daily.         . traZODone (DESYREL) 100 MG tablet   Oral   Take 200 mg by mouth at bedtime.         . vitamin B-12 (CYANOCOBALAMIN) 1000 MCG tablet   Oral   Take 1,000 mcg by mouth daily.          BP 164/92  Pulse 114  Temp(Src) 98.5 F (36.9 C) (Oral)  Resp 14  SpO2 96% Physical Exam  Nursing note and vitals reviewed. HENT:  Head: Normocephalic and atraumatic.  Mouth/Throat: Oropharynx is clear and moist. No oropharyngeal exudate.  Eyes: Conjunctivae and EOM are normal. Pupils are equal, round, and reactive to light. No scleral icterus.  Neck: Normal range of motion.  Cardiovascular: Normal rate, regular rhythm and intact  distal pulses.   Dorsalis pedis and posterior tibial pulses 2+ bilaterally.  Pulmonary/Chest: Effort normal and breath sounds normal. No respiratory distress. He has no wheezes. He has no rales.  Genitourinary: Rectum normal.  Normal rectal tone on chaperoned rectal exam.  Musculoskeletal:       Cervical back: Normal.       Thoracic back: Normal.       Lumbar back: He exhibits decreased range of motion (Secondary to pain), tenderness, bony tenderness and pain. He exhibits no swelling, no edema, no deformity and no spasm.       Back:  No bony deformities or step-offs palpated. Negative straight leg raise and crossed straight-leg raise.  Neurological:  No sensory or motor deficits appreciated. DTRs normal and symmetric.  Skin: Skin is warm and dry. No rash noted. No erythema. No pallor.  Psychiatric: He has a normal mood and affect. His behavior is normal.    ED Course  Procedures (including critical care time) Labs Reviewed - No data to display No results found.  Dg Lumbar Spine 2-3 Views  05/23/2013   *RADIOLOGY REPORT*  Clinical Data: Low back pain.  LUMBAR SPINE - 2-3 VIEW  Comparison: Lumbar spine MRI 01/12/2005.  Findings: Three views of the lumbar spine demonstrate no definite acute displaced fracture or compression type fracture.  Alignment is anatomic.  Multilevel degenerative disc disease and facet arthropathy, most severe at L5-S1.  IMPRESSION: 1.  No acute radiographic abnormality of the lumbar spine. 2.  Multilevel degenerative disc disease and lumbar spondylosis, most severe at L5-S1.   Original Report Authenticated By: Trudie Reed, M.D.   Mr Lumbar Spine W Wo Contrast  05/23/2013   *RADIOLOGY REPORT*  Clinical Data: Low back and right leg pain with bladder symptoms.  MRI LUMBAR SPINE WITHOUT AND WITH CONTRAST  Technique:  Multiplanar and multiecho pulse sequences of the lumbar spine were obtained without and with intravenous contrast.  Contrast: 20mL MULTIHANCE GADOBENATE  DIMEGLUMINE 529 MG/ML IV SOLN  Comparison: Lumbar MRI January 12, 2005.  Lumbar spine radiographs 05/23/2013.  Findings: There is chronic disc and endplate degeneration at L5-S1 which appears mildly progressive.  There is no evidence of acute fracture or pars defect.  The alignment is  anatomic.  The conus medullaris extends to the L2 level and appears normal. There is no abnormal intradural enhancement.  There are no paraspinal abnormalities.  There is minimal disc desiccation at the L1-L2 and L2-L3 levels. However, from T12-T1 through L3-L4, there is no disc herniation, spinal stenosis or nerve root encroachment.  L4-L5:  Mild annular disc bulging is stable.  There is mildly progressive facet and ligamentous hypertrophy with small bilateral facet joint effusions.  Minimal narrowing of the lateral recesses is stable.  There is no foraminal compromise or exiting nerve root encroachment.  L5-S1: There is chronic degenerative disc disease with progressive loss of disc height, annular bulging and paraspinal osteophytes. Mild facet and ligamentous hypertrophy bilaterally is unchanged. Likewise, the mild right and moderate left foraminal stenosis is similar to the prior examination.  There is possible encroachment on either exiting L5 nerve root.  No S1 nerve root displacement is identified.  IMPRESSION:  1.  Mildly progressive chronic degenerative disc disease at L5-S1 with associated chronic left greater than right foraminal stenosis. There is possible chronic L5 nerve root encroachment. 2.  No other significant spinal stenosis or nerve root encroachment. 3.  No acute findings are identified to explain the patient's radicular symptoms. 4.  Mildly progressive facet joint effusions bilaterally at L4-L5.   Original Report Authenticated By: Carey Bullocks, M.D.   1. Chronic back pain    MDM  Uncomplicated chronic back pain - Patient denies recent trauma, injury, or falls since last seen on 05/23/13; denies change in  symptoms. Old imaging reviewed. Patient neurovascularly intact and ambulatory without assistance. No red flags or concerning signs of cauda equina. Symptoms managed in ED with IM Decadron, Dilaudid, and PO Valium with improvement. Endorses appt with neurosurgeon in 2 weeks. Appropriate for d/c with Valium, percocet, and prednisone for symptoms. Ice packs followed by alternating ice and heat advised. Indications for ED return discussed with the patient who verbalizes comfort and understanding with this discharge plan.  Antony Madura, PA-C 06/13/13 [phone]

## 2013-06-13 NOTE — ED Provider Notes (Signed)
Medical screening examination/treatment/procedure(s) were performed by non-physician practitioner and as supervising physician I was immediately available for consultation/collaboration.  Sonji Starkes M Curry Seefeldt, MD 06/13/13 0732 

## 2013-07-04 ENCOUNTER — Ambulatory Visit: Payer: Self-pay | Admitting: Physician Assistant

## 2013-07-04 ENCOUNTER — Encounter: Payer: Self-pay | Admitting: Physician Assistant

## 2013-07-04 VITALS — BP 120/76 | HR 126 | Temp 98.3°F | Resp 18 | Ht 74.0 in | Wt 320.0 lb

## 2013-07-04 DIAGNOSIS — M5417 Radiculopathy, lumbosacral region: Secondary | ICD-10-CM

## 2013-07-04 DIAGNOSIS — M5137 Other intervertebral disc degeneration, lumbosacral region: Secondary | ICD-10-CM

## 2013-07-04 DIAGNOSIS — I10 Essential (primary) hypertension: Secondary | ICD-10-CM

## 2013-07-04 MED ORDER — MELOXICAM 15 MG PO TABS: 15.0000 mg | ORAL_TABLET | Freq: Every day | ORAL | Status: DC

## 2013-07-04 MED ORDER — CYCLOBENZAPRINE HCL 10 MG PO TABS: 10.0000 mg | ORAL_TABLET | Freq: Every evening | ORAL | Status: DC | PRN

## 2013-07-04 MED ORDER — LOSARTAN POTASSIUM-HCTZ 50-12.5 MG PO TABS: 1.0000 | ORAL_TABLET | Freq: Every day | ORAL | Status: DC

## 2013-07-04 MED ORDER — AMLODIPINE BESYLATE 5 MG PO TABS: 5.0000 mg | ORAL_TABLET | Freq: Two times a day (BID) | ORAL | Status: DC

## 2013-07-04 MED ORDER — HYDROCODONE-ACETAMINOPHEN 5-325 MG PO TABS: 1.0000 | ORAL_TABLET | Freq: Two times a day (BID) | ORAL | Status: DC | PRN
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
## 2013-07-04 NOTE — Patient Instructions (Signed)
Please proceed with the appointment with the neurosurgeon.

## 2013-07-05 ENCOUNTER — Other Ambulatory Visit: Payer: Self-pay

## 2013-07-05 NOTE — Telephone Encounter (Signed)
Pt seen yesterday by Chelle. He was on losartin 100/12 but we wrote for 50/12 (or vice versa).  Pt [phone] [facility] [address] [employer]

## 2013-07-05 NOTE — Telephone Encounter (Signed)
Pended higher dose, please advise.

## 2013-07-10 ENCOUNTER — Telehealth: Payer: Self-pay

## 2013-07-10 DIAGNOSIS — I10 Essential (primary) hypertension: Secondary | ICD-10-CM

## 2013-07-10 MED ORDER — LOSARTAN POTASSIUM-HCTZ 100-12.5 MG PO TABS: 1.0000 | ORAL_TABLET | Freq: Every day | ORAL | Status: DC

## 2013-07-10 MED ORDER — AMLODIPINE BESYLATE 5 MG PO TABS: 5.0000 mg | ORAL_TABLET | Freq: Two times a day (BID) | ORAL | Status: DC

## 2013-07-10 NOTE — Telephone Encounter (Signed)
Patient states that his Losartan RX be 100/12.5 MG and not 50/12.5 MG. Patient uses [facility] in [place] [phone]). Best number: [phone]

## 2013-07-10 NOTE — Telephone Encounter (Signed)
Yes, thanks, done. [facility]? [facility]? Called patient.

## 2013-07-10 NOTE — Telephone Encounter (Signed)
I thought this was done. From your office visit: HTN (hypertension) - Plan: amLODipine (NORVASC) 5 MG tablet, losartan-hydrochlorothiazide (HYZAAR) 50-12.5 MG per tablet Patient indicates previously was Losartan 100/12.5, pharmacy has not filled this before and did not know prior dose, message was sent on this on 07/05/13. Unsure where this is.

## 2013-07-10 NOTE — Telephone Encounter (Signed)
The patient is stating that the dose should be 100/12.5?  If so, please send this dose and cancel the 50/12.5.

## 2013-07-16 NOTE — Telephone Encounter (Signed)
It appears this was already taken care of.  If so, please close the encounter.  If not, please resolve this for him.

## 2013-07-18 ENCOUNTER — Telehealth: Payer: Self-pay

## 2013-07-18 DIAGNOSIS — M5137 Other intervertebral disc degeneration, lumbosacral region: Secondary | ICD-10-CM

## 2013-07-18 MED ORDER — HYDROCODONE-ACETAMINOPHEN 5-325 MG PO TABS: 1.0000 | ORAL_TABLET | Freq: Four times a day (QID) | ORAL | Status: DC | PRN

## 2013-07-18 NOTE — Telephone Encounter (Signed)
Jesus Crosby,   Patient will see neurosurgeon next week.  He is hoping you can give him more than 2 hydrocodones tablets as it is not helping with the pain.   [phone]

## 2013-07-18 NOTE — Telephone Encounter (Signed)
Rx printed.  Meds ordered this encounter  Medications  . HYDROcodone-acetaminophen (NORCO) 5-325 MG per tablet    Sig: Take 1 tablet by mouth every 6 (six) hours as needed for pain.    Dispense:  90 tablet    Refill:  0    Order Specific Question:  Supervising Provider    Answer:  DOOLITTLE, ROBERT P [[id number]]

## 2013-07-19 ENCOUNTER — Telehealth: Payer: Self-pay

## 2013-07-19 NOTE — Telephone Encounter (Signed)
Please advise 

## 2013-07-19 NOTE — Telephone Encounter (Signed)
Pt is wanting to talk with someone about increasing the dosage of the pain medication til he sees the neurosurgery

## 2013-07-19 NOTE — Telephone Encounter (Signed)
LMOM Rx sent to pharmacy

## 2013-07-24 NOTE — Telephone Encounter (Signed)
Dose maintained, but increased frequency.  See Rx 07/18/2013

## 2013-07-25 NOTE — Telephone Encounter (Signed)
LMOM to CB. 

## 2013-08-01 ENCOUNTER — Ambulatory Visit: Payer: Self-pay | Admitting: Physician Assistant

## 2013-09-12 ENCOUNTER — Encounter (INDEPENDENT_AMBULATORY_CARE_PROVIDER_SITE_OTHER): Payer: Medicaid Other | Admitting: Ophthalmology

## 2013-09-12 DIAGNOSIS — H43819 Vitreous degeneration, unspecified eye: Secondary | ICD-10-CM

## 2013-09-12 DIAGNOSIS — T8522XA Displacement of intraocular lens, initial encounter: Secondary | ICD-10-CM

## 2013-09-12 DIAGNOSIS — H35039 Hypertensive retinopathy, unspecified eye: Secondary | ICD-10-CM

## 2013-09-12 DIAGNOSIS — H27139 Posterior dislocation of lens, unspecified eye: Secondary | ICD-10-CM

## 2013-09-12 DIAGNOSIS — I10 Essential (primary) hypertension: Secondary | ICD-10-CM

## 2013-09-12 NOTE — H&P (Signed)
Jesus Crosby. is an 47 y.o. male.   Chief Complaint:Sudden blurred and double vision right eye  HPI: dislocation of intraocular lens right eye  Past Medical History  Diagnosis Date  . Lens disease   . Brain bleed   . Cataract     bilateral repair with lens implants    Past Surgical History  Procedure Laterality Date  . 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 Wrist surgery Right   . Dermabrasion of face      due to acne scars    No family history on file. Social History:  reports that he has never smoked. He does not have any smokeless tobacco history on file. He reports that he does not drink alcohol. His drug history is not on file.  Allergies:  Allergies  Allergen Reactions  . Pineapple Swelling    Lips swelling  . Amoxicillin Nausea And Vomiting    REACTION: Nausea and vomiting    No prescriptions prior to admission    Review of systems otherwise negative  There were no vitals taken for this visit.  Physical exam: Mental status: oriented x3. Eyes: See eye exam associated with this date of surgery in media tab.  Scanned in by scanning center Ears, Nose, Throat: within normal limits Neck: Within Normal limits General: within normal limits Chest: Within normal limits Breast: deferred Heart: Within normal limits Abdomen: Within normal limits GU: deferred Extremities: within normal limits Skin: within normal limits  Assessment/Plan Dislocation of intraocular lens right eye Plan: To [facility] for Pars plana vitrectomy, remove intraocular lens, place secondary intraocular lens with suture right eye  Jesus Crosby 09/12/2013, 5:00 PM

## 2013-09-13 ENCOUNTER — Encounter (HOSPITAL_COMMUNITY): Payer: Self-pay | Admitting: Pharmacy Technician

## 2013-09-16 ENCOUNTER — Ambulatory Visit (HOSPITAL_COMMUNITY)
Admission: RE | Admit: 2013-09-16 | Discharge: 2013-09-16 | Disposition: A | Discharge status: Home / Self Care | Payer: Medicaid Other | Source: Ambulatory Visit | Attending: Ophthalmology | Admitting: Ophthalmology

## 2013-09-16 ENCOUNTER — Encounter (HOSPITAL_COMMUNITY)
Admission: RE | Admit: 2013-09-16 | Discharge: 2013-09-16 | Disposition: A | Discharge status: Home / Self Care | Payer: Medicaid Other | Source: Ambulatory Visit | Attending: Ophthalmology | Admitting: Ophthalmology

## 2013-09-16 ENCOUNTER — Encounter (HOSPITAL_COMMUNITY): Payer: Self-pay

## 2013-09-16 DIAGNOSIS — Z01810 Encounter for preprocedural cardiovascular examination: Secondary | ICD-10-CM | POA: Insufficient documentation

## 2013-09-16 DIAGNOSIS — Z01818 Encounter for other preprocedural examination: Secondary | ICD-10-CM | POA: Insufficient documentation

## 2013-09-16 DIAGNOSIS — Z01812 Encounter for preprocedural laboratory examination: Secondary | ICD-10-CM | POA: Insufficient documentation

## 2013-09-16 HISTORY — DX: Myoneural disorder, unspecified: G70.9

## 2013-09-16 HISTORY — DX: Unspecified osteoarthritis, unspecified site: M19.90

## 2013-09-16 HISTORY — DX: Depression, unspecified: F32.A

## 2013-09-16 HISTORY — DX: Homocystinuria: E72.11

## 2013-09-16 HISTORY — DX: Major depressive disorder, single episode, unspecified: F32.9

## 2013-09-16 HISTORY — DX: Obsessive-compulsive disorder, unspecified: F42.9

## 2013-09-16 HISTORY — DX: Anxiety disorder, unspecified: F41.9

## 2013-09-16 LAB — BASIC METABOLIC PANEL
BUN: 14 mg/dL (ref 6–23)
Calcium: 9.9 mg/dL (ref 8.4–10.5)
GFR calc non Af Amer: 70 mL/min — ABNORMAL LOW (ref >90)
Glucose, Bld: 81 mg/dL (ref 70–99)
Potassium: 3.9 mEq/L (ref 3.5–5.1)
Sodium: 131 mEq/L — ABNORMAL LOW (ref 135–145)

## 2013-09-16 LAB — CBC
HCT: 42.8 % (ref 39.0–52.0)
Hemoglobin: 14.8 g/dL (ref 13.0–17.0)
MCH: 31.5 pg (ref 26.0–34.0)
MCHC: 34.6 g/dL (ref 30.0–36.0)
Platelets: 198 10*3/uL (ref 150–400)
RDW: 13.4 % (ref 11.5–15.5)
WBC: 5.8 10*3/uL (ref 4.0–10.5)

## 2013-09-16 MED ORDER — DEXTROSE 5 % IV SOLN
3.0000 g | INTRAVENOUS | Status: AC
  Administered 2013-09-17: 3 g via INTRAVENOUS
  Filled 2013-09-16: qty 3000

## 2013-09-16 NOTE — Pre-Procedure Instructions (Signed)
Kingstin Heims.  09/16/2013   Your procedure is scheduled on:    Tuesday  09/17/13  Report to [facility]  2 * 3 at AS INSTRUCTED BY DR. [name] .  Call this number if you have problems the morning of surgery: [phone]   Remember:   Do not eat food or drink liquids after midnight.   Take these medicines the morning of surgery with A SIP OF WATER: alprazolam if needed, amlodipine(norvasc)    Do not wear jewelry, make-up or nail polish.  Do not wear lotions, powders, or perfumes. You may wear deodorant.  Do not shave 48 hours prior to surgery. Men may shave face and neck.  Do not bring valuables to the hospital.  [facility] is not responsible                  for any belongings or valuables.               Contacts, dentures or bridgework may not be worn into surgery.  Leave suitcase in the car. After surgery it may be brought to your room.  For patients admitted to the hospital, discharge time is determined by your                treatment team.               Patients discharged the day of surgery will not be allowed to drive  home.  Name and phone number of your driver:  Special Instructions: Shower using CHG 2 nights before surgery and the night before surgery.  If you shower the day of surgery use CHG.  Use special wash - you have one bottle of CHG for all showers.  You should use approximately 1/3 of the bottle for each shower.   Please read over the following fact sheets that you were given: Pain Booklet, Coughing and Deep Breathing and Surgical Site Infection Prevention

## 2013-09-16 NOTE — Progress Notes (Signed)
09/16/13 1306  OBSTRUCTIVE SLEEP APNEA  Have you ever been diagnosed with sleep apnea through a sleep study? No  Do you snore loudly (loud enough to be heard through closed doors)?  1  Do you often feel tired, fatigued, or sleepy during the daytime? 0  Has anyone observed you stop breathing during your sleep? 0  Do you have, or are you being treated for high blood pressure? 1  BMI more than 35 kg/m2? 1  Age over 47 years old? 0  Neck circumference greater than 40 cm/18 inches? 1  Gender: 1  Obstructive Sleep Apnea Score 5  Score 4 or greater  No PCP

## 2013-09-17 ENCOUNTER — Ambulatory Visit (HOSPITAL_COMMUNITY)
Admission: RE | Admit: 2013-09-17 | Discharge: 2013-09-17 | Disposition: A | Discharge status: Home / Self Care | Payer: Medicaid Other | Source: Ambulatory Visit | Attending: Ophthalmology | Admitting: Ophthalmology

## 2013-09-17 ENCOUNTER — Encounter (HOSPITAL_COMMUNITY): Payer: Self-pay | Admitting: *Deleted

## 2013-09-17 ENCOUNTER — Encounter (HOSPITAL_COMMUNITY)
Admission: RE | Disposition: A | Discharge status: Home / Self Care | Payer: Self-pay | Source: Ambulatory Visit | Attending: Ophthalmology

## 2013-09-17 ENCOUNTER — Ambulatory Visit (HOSPITAL_COMMUNITY): Payer: Medicaid Other | Admitting: Critical Care Medicine

## 2013-09-17 ENCOUNTER — Encounter (HOSPITAL_COMMUNITY): Payer: Medicaid Other | Admitting: Critical Care Medicine

## 2013-09-17 DIAGNOSIS — Z01818 Encounter for other preprocedural examination: Secondary | ICD-10-CM | POA: Insufficient documentation

## 2013-09-17 DIAGNOSIS — T8522XA Displacement of intraocular lens, initial encounter: Secondary | ICD-10-CM

## 2013-09-17 DIAGNOSIS — Y831 Surgical operation with implant of artificial internal device as the cause of abnormal reaction of the patient, or of later complication, without mention of misadventure at the time of the procedure: Secondary | ICD-10-CM | POA: Insufficient documentation

## 2013-09-17 DIAGNOSIS — Z01810 Encounter for preprocedural cardiovascular examination: Secondary | ICD-10-CM | POA: Insufficient documentation

## 2013-09-17 DIAGNOSIS — T8529XA Other mechanical complication of intraocular lens, initial encounter: Secondary | ICD-10-CM | POA: Insufficient documentation

## 2013-09-17 DIAGNOSIS — H27139 Posterior dislocation of lens, unspecified eye: Secondary | ICD-10-CM

## 2013-09-17 DIAGNOSIS — H33309 Unspecified retinal break, unspecified eye: Secondary | ICD-10-CM | POA: Insufficient documentation

## 2013-09-17 DIAGNOSIS — Z01812 Encounter for preprocedural laboratory examination: Secondary | ICD-10-CM | POA: Insufficient documentation

## 2013-09-17 HISTORY — PX: PARS PLANA VITRECTOMY: SHX2166

## 2013-09-17 HISTORY — PX: GAS/FLUID EXCHANGE: SHX5334

## 2013-09-17 HISTORY — PX: PHOTOCOAGULATION: SHX5303

## 2013-09-17 SURGERY — PARS PLANA VITRECTOMY WITH 25G REMOVAL/SUTURE INTRAOCULAR LENS
Anesthesia: General | Site: Eye | Laterality: Right | Wound class: Clean

## 2013-09-17 MED ORDER — FENTANYL CITRATE 0.05 MG/ML IJ SOLN
INTRAMUSCULAR | Status: DC | PRN
  Administered 2013-09-17: 150 ug via INTRAVENOUS
  Administered 2013-09-17: 25 ug via INTRAVENOUS

## 2013-09-17 MED ORDER — EPINEPHRINE HCL 1 MG/ML IJ SOLN
INTRAOCULAR | Status: DC | PRN
  Administered 2013-09-17 (×2)

## 2013-09-17 MED ORDER — ONDANSETRON HCL 4 MG/2ML IJ SOLN
INTRAMUSCULAR | Status: DC | PRN
  Administered 2013-09-17: 4 mg via INTRAMUSCULAR

## 2013-09-17 MED ORDER — HEMOSTATIC AGENTS (NO CHARGE) OPTIME
TOPICAL | Status: DC | PRN
  Administered 2013-09-17: 1 via TOPICAL

## 2013-09-17 MED ORDER — BSS PLUS IO SOLN
INTRAOCULAR | Status: AC
  Filled 2013-09-17: qty 500

## 2013-09-17 MED ORDER — SODIUM HYALURONATE 10 MG/ML IO SOLN
INTRAOCULAR | Status: DC | PRN
  Administered 2013-09-17: 0.85 mL via INTRAOCULAR

## 2013-09-17 MED ORDER — SODIUM HYALURONATE 10 MG/ML IO SOLN
INTRAOCULAR | Status: AC
  Filled 2013-09-17: qty 0.85

## 2013-09-17 MED ORDER — BSS IO SOLN
INTRAOCULAR | Status: AC
  Filled 2013-09-17: qty 15

## 2013-09-17 MED ORDER — GENTAMICIN SULFATE 40 MG/ML IJ SOLN
INTRAMUSCULAR | Status: AC
  Filled 2013-09-17: qty 2

## 2013-09-17 MED ORDER — ATROPINE SULFATE 1 % OP SOLN
OPHTHALMIC | Status: AC
  Filled 2013-09-17: qty 2

## 2013-09-17 MED ORDER — PHENYLEPHRINE HCL 2.5 % OP SOLN
1.0000 [drp] | OPHTHALMIC | Status: AC | PRN
  Administered 2013-09-17 (×3): 1 [drp] via OPHTHALMIC
  Filled 2013-09-17: qty 2

## 2013-09-17 MED ORDER — LIDOCAINE HCL (CARDIAC) 20 MG/ML IV SOLN
INTRAVENOUS | Status: DC | PRN
  Administered 2013-09-17: 50 mg via INTRAVENOUS

## 2013-09-17 MED ORDER — SODIUM CHLORIDE 0.9 % IV SOLN
INTRAVENOUS | Status: DC
  Administered 2013-09-17: 10:00:00 via INTRAVENOUS

## 2013-09-17 MED ORDER — OXYCODONE HCL 5 MG PO TABS: 5.0000 mg | ORAL_TABLET | Freq: Once | ORAL | Status: DC | PRN

## 2013-09-17 MED ORDER — HYDROMORPHONE HCL PF 1 MG/ML IJ SOLN
0.2500 mg | INTRAMUSCULAR | Status: DC | PRN
  Administered 2013-09-17: 0.5 mg via INTRAVENOUS

## 2013-09-17 MED ORDER — POLYMYXIN B SULFATE 500000 UNITS IJ SOLR
INTRAMUSCULAR | Status: AC
  Filled 2013-09-17: qty 1

## 2013-09-17 MED ORDER — DEXAMETHASONE SODIUM PHOSPHATE 10 MG/ML IJ SOLN
INTRAMUSCULAR | Status: AC
  Filled 2013-09-17: qty 1

## 2013-09-17 MED ORDER — HYDROMORPHONE HCL PF 1 MG/ML IJ SOLN
INTRAMUSCULAR | Status: AC
  Filled 2013-09-17: qty 1

## 2013-09-17 MED ORDER — BACITRACIN-POLYMYXIN B 500-10000 UNIT/GM OP OINT
TOPICAL_OINTMENT | OPHTHALMIC | Status: AC
  Filled 2013-09-17: qty 3.5

## 2013-09-17 MED ORDER — GATIFLOXACIN 0.5 % OP SOLN
1.0000 [drp] | OPHTHALMIC | Status: AC | PRN
  Administered 2013-09-17 (×3): 1 [drp] via OPHTHALMIC
  Filled 2013-09-17: qty 2.5

## 2013-09-17 MED ORDER — MIDAZOLAM HCL 5 MG/5ML IJ SOLN
INTRAMUSCULAR | Status: DC | PRN
  Administered 2013-09-17: 2 mg via INTRAVENOUS

## 2013-09-17 MED ORDER — EPINEPHRINE HCL 1 MG/ML IJ SOLN
INTRAMUSCULAR | Status: AC
  Filled 2013-09-17: qty 1

## 2013-09-17 MED ORDER — PROPOFOL 10 MG/ML IV BOLUS
INTRAVENOUS | Status: DC | PRN
  Administered 2013-09-17: 200 mg via INTRAVENOUS

## 2013-09-17 MED ORDER — CYCLOPENTOLATE HCL 1 % OP SOLN
1.0000 [drp] | OPHTHALMIC | Status: AC | PRN
  Administered 2013-09-17 (×3): 1 [drp] via OPHTHALMIC
  Filled 2013-09-17: qty 2

## 2013-09-17 MED ORDER — OXYCODONE HCL 5 MG/5ML PO SOLN: 5.0000 mg | Freq: Once | ORAL | Status: DC | PRN

## 2013-09-17 MED ORDER — DEXAMETHASONE SODIUM PHOSPHATE 10 MG/ML IJ SOLN
INTRAMUSCULAR | Status: DC | PRN
  Administered 2013-09-17: 10 mg

## 2013-09-17 MED ORDER — SUCCINYLCHOLINE CHLORIDE 20 MG/ML IJ SOLN
INTRAMUSCULAR | Status: DC | PRN
  Administered 2013-09-17: 100 mg via INTRAVENOUS

## 2013-09-17 MED ORDER — TROPICAMIDE 1 % OP SOLN
1.0000 [drp] | OPHTHALMIC | Status: AC | PRN
  Administered 2013-09-17 (×3): 1 [drp] via OPHTHALMIC
  Filled 2013-09-17: qty 3

## 2013-09-17 MED ORDER — SODIUM CHLORIDE 0.9 % IJ SOLN
INTRAMUSCULAR | Status: DC | PRN
  Administered 2013-09-17: 13:00:00

## 2013-09-17 MED ORDER — BACITRACIN-POLYMYXIN B 500-10000 UNIT/GM OP OINT
TOPICAL_OINTMENT | OPHTHALMIC | Status: DC | PRN
  Administered 2013-09-17: 1 via OPHTHALMIC

## 2013-09-17 MED ORDER — ONDANSETRON HCL 4 MG/2ML IJ SOLN: 4.0000 mg | Freq: Once | INTRAMUSCULAR | Status: DC | PRN

## 2013-09-17 MED ORDER — TRIAMCINOLONE ACETONIDE 40 MG/ML IJ SUSP
INTRAMUSCULAR | Status: AC
  Filled 2013-09-17: qty 5

## 2013-09-17 MED ORDER — BUPIVACAINE HCL (PF) 0.75 % IJ SOLN
INTRAMUSCULAR | Status: DC | PRN
  Administered 2013-09-17: 10 mL

## 2013-09-17 MED ORDER — SODIUM CHLORIDE 0.9 % IJ SOLN
INTRAMUSCULAR | Status: AC
  Filled 2013-09-17: qty 10

## 2013-09-17 MED ORDER — BUPIVACAINE HCL (PF) 0.75 % IJ SOLN
INTRAMUSCULAR | Status: AC
  Filled 2013-09-17: qty 10

## 2013-09-17 SURGICAL SUPPLY — 66 items
ACCESSORY FRAGMATOME (MISCELLANEOUS) IMPLANT
APPLICATOR DR MATTHEWS STRL (MISCELLANEOUS) IMPLANT
BLADE EYE CATARACT 19 1.4 BEAV (BLADE) IMPLANT
BLADE KERATOME 2.75 (BLADE) ×2 IMPLANT
CANNULA VLV SOFT TIP 25GA (OPHTHALMIC) ×2 IMPLANT
CLOTH BEACON ORANGE TIMEOUT ST (SAFETY) IMPLANT
CORDS BIPOLAR (ELECTRODE) IMPLANT
COTTONBALL LRG STERILE PKG (GAUZE/BANDAGES/DRESSINGS) ×6 IMPLANT
COVER MAYO STAND STRL (DRAPES) ×2 IMPLANT
DRAPE INCISE 51X51 W/FILM STRL (DRAPES) ×2 IMPLANT
DRAPE OPHTHALMIC 77X100 STRL (CUSTOM PROCEDURE TRAY) ×2 IMPLANT
FILTER BLUE MILLIPORE (MISCELLANEOUS) IMPLANT
FORCEPS ECKARDT ILM 25G SERR (OPHTHALMIC RELATED) IMPLANT
FORCEPS HORIZONTAL 25G DISP (OPHTHALMIC RELATED) IMPLANT
GAS OPHTHALMIC (MISCELLANEOUS) IMPLANT
GLOVE BIOGEL PI IND STRL 7.5 (GLOVE) ×1 IMPLANT
GLOVE BIOGEL PI INDICATOR 7.5 (GLOVE) ×1
GLOVE SS BIOGEL STRL SZ 6.5 (GLOVE) ×1 IMPLANT
GLOVE SS BIOGEL STRL SZ 7 (GLOVE) ×1 IMPLANT
GLOVE SUPERSENSE BIOGEL SZ 6.5 (GLOVE) ×1
GLOVE SUPERSENSE BIOGEL SZ 7 (GLOVE) ×1
GLOVE SURG 8.5 LATEX PF (GLOVE) ×2 IMPLANT
GLOVE SURG SS PI 6.5 STRL IVOR (GLOVE) ×2 IMPLANT
GLOVE SURG SS PI 7.5 STRL IVOR (GLOVE) ×2 IMPLANT
GOWN STRL NON-REIN LRG LVL3 (GOWN DISPOSABLE) ×8 IMPLANT
KIT BASIN OR (CUSTOM PROCEDURE TRAY) ×2 IMPLANT
KIT ROOM TURNOVER OR (KITS) ×2 IMPLANT
KNIFE CRESCENT 1.75 EDGEAHEAD (BLADE) IMPLANT
LENS IOL POST 1PIECE DIOP 16.5 (Intraocular Lens) ×2 IMPLANT
MASK EYE SHIELD (GAUZE/BANDAGES/DRESSINGS) ×2 IMPLANT
MICROPICK 25G (MISCELLANEOUS)
NEEDLE 18GX1X1/2 (RX/OR ONLY) (NEEDLE) ×2 IMPLANT
NEEDLE 25GX 5/8IN NON SAFETY (NEEDLE) ×2 IMPLANT
NEEDLE 27GX1/2 REG BEVEL ECLIP (NEEDLE) IMPLANT
NEEDLE FILTER BLUNT 18X 1/2SAF (NEEDLE) ×1
NEEDLE FILTER BLUNT 18X1 1/2 (NEEDLE) ×1 IMPLANT
NEEDLE HYPO 30X.5 LL (NEEDLE) ×2 IMPLANT
NS IRRIG 1000ML POUR BTL (IV SOLUTION) ×2 IMPLANT
PACK VITRECTOMY CUSTOM (CUSTOM PROCEDURE TRAY) ×2 IMPLANT
PAD ARMBOARD 7.5X6 YLW CONV (MISCELLANEOUS) ×2 IMPLANT
PAD EYE OVAL STERILE LF (GAUZE/BANDAGES/DRESSINGS) ×2 IMPLANT
PAK PIK VITRECTOMY CVS 25GA (OPHTHALMIC) ×2 IMPLANT
PENCIL BIPOLAR 25GA STR DISP (OPHTHALMIC RELATED) IMPLANT
PIC ILLUMINATED 25G (OPHTHALMIC) ×2
PICK MICROPICK 25G (MISCELLANEOUS) IMPLANT
PIK ILLUMINATED 25G (OPHTHALMIC) ×1 IMPLANT
PROBE LASER ILLUM FLEX CVD 25G (OPHTHALMIC) ×2 IMPLANT
ROLLS DENTAL (MISCELLANEOUS) ×4 IMPLANT
SCRAPER DIAMOND 25GA (OPHTHALMIC RELATED) IMPLANT
SPONGE SURGIFOAM ABS GEL 12-7 (HEMOSTASIS) ×2 IMPLANT
STOPCOCK 4 WAY LG BORE MALE ST (IV SETS) IMPLANT
SUT CHROMIC 7 0 TG140 8 (SUTURE) ×2 IMPLANT
SUT ETHILON 10 0 CS140 6 (SUTURE) ×2 IMPLANT
SUT ETHILON 9 0 BV100 4 (SUTURE) IMPLANT
SUT POLY NON ABSORB 10-0 8 STR (SUTURE) ×4 IMPLANT
SUT SILK 4 0 RB 1 (SUTURE) IMPLANT
SYR 20CC LL (SYRINGE) ×2 IMPLANT
SYR 5ML LL (SYRINGE) IMPLANT
SYR BULB 3OZ (MISCELLANEOUS) ×2 IMPLANT
SYR TB 1ML LUER SLIP (SYRINGE) ×2 IMPLANT
SYRINGE 10CC LL (SYRINGE) IMPLANT
TAPE SURG TRANSPORE 1 IN (GAUZE/BANDAGES/DRESSINGS) ×1 IMPLANT
TAPE SURGICAL TRANSPORE 1 IN (GAUZE/BANDAGES/DRESSINGS) ×1
TOWEL OR 17X24 6PK STRL BLUE (TOWEL DISPOSABLE) ×6 IMPLANT
TROCAR CANNULA 25GA (CANNULA) IMPLANT
WATER STERILE IRR 1000ML POUR (IV SOLUTION) ×2 IMPLANT

## 2013-09-17 NOTE — Anesthesia Procedure Notes (Signed)
Procedure Name: Intubation Date/Time: 09/17/2013 1:13 PM Performed by: Sherrie George Pre-anesthesia Checklist: Patient identified, Emergency Drugs available, Suction available and Patient being monitored Patient Re-evaluated:Patient Re-evaluated prior to inductionOxygen Delivery Method: Circle system utilized Preoxygenation: Pre-oxygenation with 100% oxygen Intubation Type: IV induction Ventilation: Mask ventilation without difficulty, Two handed mask ventilation required and Oral airway inserted - appropriate to patient size Laryngoscope Size: Hyacinth Meeker and 3 Grade View: Grade I Tube type: Oral Tube size: 7.5 mm Number of attempts: 1 Airway Equipment and Method: Stylet Placement Confirmation: ETT inserted through vocal cords under direct vision,  positive ETCO2 and breath sounds checked- equal and bilateral Secured at: 23 cm Tube secured with: Tape Dental Injury: Teeth and Oropharynx as per pre-operative assessment

## 2013-09-17 NOTE — H&P (Signed)
I examined the patient today and there is no change in the medical status 

## 2013-09-17 NOTE — Anesthesia Preprocedure Evaluation (Addendum)
Anesthesia Evaluation  Patient identified by MRN, date of birth, ID band Patient awake and Patient confused    Reviewed: Allergy & Precautions, H&P , NPO status , Patient's Chart, lab work & pertinent test results  Airway Mallampati: II TM Distance: >3 FB Neck ROM: Full    Dental  (+) Teeth Intact and Dental Advisory Given   Pulmonary  breath sounds clear to auscultation        Cardiovascular Rhythm:Regular Rate:Normal     Neuro/Psych    GI/Hepatic   Endo/Other    Renal/GU      Musculoskeletal   Abdominal (+) + obese,   Peds  Hematology   Anesthesia Other Findings   Reproductive/Obstetrics                          Anesthesia Physical Anesthesia Plan  ASA: III  Anesthesia Plan: General   Post-op Pain Management:    Induction: Intravenous  Airway Management Planned: Oral ETT  Additional Equipment:   Intra-op Plan:   Post-operative Plan: Extubation in OR  Informed Consent: I have reviewed the patients History and Physical, chart, labs and discussed the procedure including the risks, benefits and alternatives for the proposed anesthesia with the patient or authorized representative who has indicated his/her understanding and acceptance.   Dental advisory given  Plan Discussed with: CRNA and Anesthesiologist  Anesthesia Plan Comments:         Anesthesia Quick Evaluation

## 2013-09-17 NOTE — Anesthesia Postprocedure Evaluation (Signed)
  Anesthesia Post-op Note  Patient: Jesus Crosby  Procedure(s) Performed: Procedure(s) with comments: PARS PLANA VITRECTOMY WITH 25G REMOVAL/SUTURE SECONDARY INTRAOCULAR LENS (Right) PHOTOCOAGULATION (Right) - HEADSCOPE LASER GAS/FLUID EXCHANGE (Right)  Patient Location: PACU  Anesthesia Type:General  Level of Consciousness: awake, alert  and oriented  Airway and Oxygen Therapy: Patient Spontanous Breathing and Patient connected to nasal cannula oxygen  Post-op Pain: mild  Post-op Assessment: Post-op Vital signs reviewed, Patient's Cardiovascular Status Stable, Respiratory Function Stable, Patent Airway and Pain level controlled  Post-op Vital Signs: stable  Complications: No apparent anesthesia complications

## 2013-09-17 NOTE — Brief Op Note (Signed)
Brief Operative note   Preoperative diagnosis:  Pre-Op Diagnosis Codes:    * Posterior dislocation of lens, right [379.34] Postoperative diagnosis  Post-Op Diagnosis Codes:    * Posterior dislocation of lens, right [379.34]  Procedures: Retinal laser,  Removal of intraocular lens with cutting of sutured haptic. Pars plana vitrectomy, placement of secondary IOL with suture, gas fluid exchange  Right eye.  Surgeon:  Sherrie George, MD...  Assistant:  Rosalie Doctor SA  Anesthesia: General  Specimen: none  Estimated blood loss:  1cc  Complications: none  Patient sent to PACU in good condition  Composed by Sherrie George MD  Dictation number: [phone]

## 2013-09-17 NOTE — Preoperative (Addendum)
Beta Blockers   Reason not to administer Beta Blockers:Not Applicable 

## 2013-09-17 NOTE — Transfer of Care (Signed)
Immediate Anesthesia Transfer of Care Note  Patient: Jesus Crosby  Procedure(s) Performed: Procedure(s) with comments: PARS PLANA VITRECTOMY WITH 25G REMOVAL/SUTURE SECONDARY INTRAOCULAR LENS (Right) PHOTOCOAGULATION (Right) - HEADSCOPE LASER GAS/FLUID EXCHANGE (Right)  Patient Location: PACU  Anesthesia Type:General  Level of Consciousness: awake, alert  and oriented  Airway & Oxygen Therapy: Patient Spontanous Breathing and Patient connected to nasal cannula oxygen  Post-op Assessment: Report given to PACU RN, Post -op Vital signs reviewed and stable and Patient moving all extremities X 4  Post vital signs: Reviewed and stable  Complications: No apparent anesthesia complications

## 2013-09-18 ENCOUNTER — Encounter (INDEPENDENT_AMBULATORY_CARE_PROVIDER_SITE_OTHER): Payer: Medicaid Other | Admitting: Ophthalmology

## 2013-09-18 ENCOUNTER — Encounter (HOSPITAL_COMMUNITY): Payer: Self-pay | Admitting: Ophthalmology

## 2013-09-18 DIAGNOSIS — H2700 Aphakia, unspecified eye: Secondary | ICD-10-CM

## 2013-09-18 NOTE — Op Note (Signed)
NAMEDAMEK, ENDE                   ACCOUNT NO.:  [id number]  MEDICAL RECORD NO.:  [id number]  LOCATION:  [facility]                         FACILITY:  [facility]  PHYSICIAN:  Beulah Gandy. Ashley Royalty, M.D. DATE OF BIRTH:  [birth year]  DATE OF PROCEDURE:  09/17/2013 DATE OF DISCHARGE:  09/17/2013                              OPERATIVE REPORT   ADMISSION DIAGNOSIS:  Dislocated intraocular lens, right eye and also retinal break, right eye.  PROCEDURES:  Indirect ophthalmoscope laser for retinal breaks, pars plana vitrectomy, removal of sutured intraocular lens with cutting of haptic placement of secondary intraocular lens with suture, all in the right eye.  SURGEON:  Beulah Gandy. Ashley Royalty, M.D.  ASSISTANT:  Rosalie Doctor, SA.  ANESTHESIA:  General.  DETAILS:  After usual prep and drape was performed, indirect ophthalmoscope laser moved into place.  Retinal laser was placed around retinal breaks and the retinal periphery.  The power was 400 mW, 537 burns, 1000 microns in size and 0.1 seconds each.  Once this was complete, attention was carried to the pars plana area where conjunctival peritomy was performed from 8 o'clock around to 4 o'clock. Half thickness scleral flaps were raised at 3 and 9 o'clock in anticipation of IOL suture.  A 3 layered corneoscleral wound was created from 10-2 o'clock, 25-gauge trocars placed at 8, 10 and 2 o'clock.  Pars plana vitrectomy was begun just behind the pupillary axis.  The intraocular lens was dislocated, yet it was tethered to the wall of the eye at 8 o'clock.  The sutures were not available externally or internally.  A 15-degree blade entry wound into the cornea at 10 o'clock allowed entry of the Sinskey hook, to try to reposition the implant. This was not possible.  Therefore, the iris was pulled back to the iris root and the implant was seen to be tethered at 8 o'clock.  It was secure and would not release.  The corneoscleral wound was opened with keratome.   Westcott scissors were used to sever the haptic of the implant as close to the wall the eyes possible.  Implant was then withdrawn from the posterior chamber and the anterior chamber and withdrawn from the eye.  It was sent to the laboratory for identification.  Two 10-0 Prolene sutures were passed from 3 o'clock to 9 o'clock posterior to the iris and in the ciliary sulcus.  A docking method was used to assure location of sutures.  The sutures were pulled, were drawn out of the eye through the pupil.  A new intraocular lens made by [employer] model [id number] power of 16.5D, length 12.5 mm optic 7.0 mm, serial #[id number] for space 052 was brought onto the field and inspected and cleaned.  The expiration date was January 2016. The Prolene sutures were attached to the eyelids of the lens.  The lens was carefully placed into the anterior chamber and then into the posterior chamber.  It was dialed into place.  Prolene sutures were drawn externally as the lens was dialed into place.  Prolene sutures were tied with triple knots.  The free ends removed and the knot allowed to remain beneath the scleral flaps.  The scleral flaps were simply draped over the knots.  The corneal wound was closed with 6 interrupted 10-0 nylon sutures.  The wound was tested and found to be secure. Additional vitrectomy was carried out, removing the core vitreous, then the mid vitreous, and then the far peripheral vitreous.  The BIOM was used for viewing.  Once this was accomplished, an air-fluid exchange was performed 30%.  The instruments were removed from the eye and the trocars were removed from the eye.  The conjunctiva was closed with 7-0 chromic suture.  Polymyxin and gentamicin were irrigated into tenon space.  No atropine was used. Marcaine was injected around the globe for postop pain.  Decadron 10 mg was injected into the lower subconjunctival space.  Polysporin ophthalmic ointment, a patch and shield  were placed.  The patient was awakened and taken to recovery in satisfactory condition.     Beulah Gandy. Ashley Royalty, M.D.     JDM/MEDQ  D:  09/17/2013  T:  09/18/2013  Job:  [id number]

## 2013-09-25 ENCOUNTER — Encounter (INDEPENDENT_AMBULATORY_CARE_PROVIDER_SITE_OTHER): Payer: Medicaid Other | Admitting: Ophthalmology

## 2013-09-25 DIAGNOSIS — H2700 Aphakia, unspecified eye: Secondary | ICD-10-CM

## 2013-10-16 ENCOUNTER — Encounter (INDEPENDENT_AMBULATORY_CARE_PROVIDER_SITE_OTHER): Payer: Medicaid Other | Admitting: Ophthalmology

## 2013-10-16 DIAGNOSIS — H2700 Aphakia, unspecified eye: Secondary | ICD-10-CM

## 2013-12-25 ENCOUNTER — Encounter (INDEPENDENT_AMBULATORY_CARE_PROVIDER_SITE_OTHER): Payer: Medicaid Other | Admitting: Ophthalmology

## 2013-12-25 DIAGNOSIS — H2700 Aphakia, unspecified eye: Secondary | ICD-10-CM

## 2013-12-25 DIAGNOSIS — H43819 Vitreous degeneration, unspecified eye: Secondary | ICD-10-CM

## 2013-12-25 DIAGNOSIS — H3530 Unspecified macular degeneration: Secondary | ICD-10-CM

## 2013-12-25 DIAGNOSIS — H35039 Hypertensive retinopathy, unspecified eye: Secondary | ICD-10-CM

## 2013-12-25 DIAGNOSIS — I10 Essential (primary) hypertension: Secondary | ICD-10-CM

## 2014-01-02 ENCOUNTER — Encounter: Payer: Self-pay | Admitting: Physician Assistant

## 2014-01-02 ENCOUNTER — Ambulatory Visit: Payer: Medicaid Other | Admitting: Physician Assistant

## 2014-01-02 VITALS — BP 109/78 | HR 86 | Temp 97.7°F | Resp 16 | Ht 73.0 in | Wt 271.0 lb

## 2014-01-02 DIAGNOSIS — I10 Essential (primary) hypertension: Secondary | ICD-10-CM

## 2014-01-02 MED ORDER — AMLODIPINE BESYLATE 5 MG PO TABS: 5.0000 mg | ORAL_TABLET | Freq: Two times a day (BID) | ORAL | Status: DC

## 2014-01-02 MED ORDER — LOSARTAN POTASSIUM-HCTZ 100-12.5 MG PO TABS: 1.0000 | ORAL_TABLET | Freq: Every day | ORAL | Status: DC

## 2014-01-02 NOTE — Patient Instructions (Signed)
Measure your blood pressure 1-2 times/week. Record your readings.  If they are consistently <120/80, we can consider reducing your medications.

## 2014-01-02 NOTE — Progress Notes (Signed)
   Subjective:    Patient ID: Jesus Crosby, male    DOB: [birth year], 48 y.o.   MRN: [id number]  PCP: No PCP Per Patient  Chief Complaint  Patient presents with  . Follow-up    BP  . Medication Refill    losartan, amlodipine   Medications, allergies, past medical history, surgical history, family history, social history and problem list reviewed and updated.  HPI Reports that he's been doing well since his last visit. Living in his own apartment now, but financially supported by his parents.  His disability claim is still in process, about 10 months in to the expected 16 month process.  It's frustrating, but he feels good overall.   He reports occasional dizziness with rapid position change, unclear if it's his BP or trazodone. BP at psychiatrists office recently was 165/110, Monday 144/82. No CP< SOB, HA, GI or GU symptoms. No thoughts of self or other harm.   Review of Systems As above.  No unexplained myalgias, arthralgias or rash. Weight is down 50 lbs since 06/2013 with medication adjustments.    Objective:   Physical Exam Blood pressure 109/78, pulse 86, temperature 97.7 F (36.5 C), resp. rate 16, height 6\' 1"  (1.854 m), weight 271 lb (122.925 kg), SpO2 97.00%. Body mass index is 35.76 kg/(m^2). Well-developed, well nourished WM who is awake, alert and oriented, in NAD. HEENT: [place]/AT, sclera and conjunctiva are clear.   Neck: supple, non-tender, no lymphadenopathy, thyromegaly. Heart: RRR, no murmur Lungs: normal effort, CTA Extremities: no cyanosis, clubbing or edema. Skin: warm and dry without rash. Psychologic: good mood and appropriate, though flat, affect, normal speech and behavior.        Assessment & Plan:  1. HTN (hypertension) Controlled here, with outside readings recently elevated, which is unusual.  He'll continue to monitor his outside readings and record them for my review.  If we can reduce his regimen, we will.  He's always taken the amlodipine BID, so  continue it that way for now. He'll need labs this summer, if his disability and medicare/medicaid application hasn't been approved by then. - amLODipine (NORVASC) 5 MG tablet; Take 1 tablet (5 mg total) by mouth 2 (two) times daily.  Dispense: 180 tablet; Refill: 1 - losartan-hydrochlorothiazide (HYZAAR) 100-12.5 MG per tablet; Take 1 tablet by mouth daily.  Dispense: 90 tablet; Refill: 1   Fara Chute, PA-C Physician Assistant-Certified [facility]

## 2014-01-03 NOTE — Assessment & Plan Note (Signed)
Controlled here, with outside readings recently elevated, which is unusual.  He'll continue to monitor his outside readings and record them for my review.  If we can reduce his regimen, we will.  He's always taken the amlodipine BID, so continue it that way for now. He'll need labs this summer, if his disability and medicare/medicaid application hasn't been approved by then.

## 2014-03-05 ENCOUNTER — Emergency Department: Payer: Self-pay | Admitting: Emergency Medicine

## 2014-03-06 ENCOUNTER — Emergency Department (HOSPITAL_COMMUNITY)
Admission: EM | Admit: 2014-03-06 | Discharge: 2014-03-06 | Disposition: A | Discharge status: Home / Self Care | Payer: Medicaid Other | Attending: Emergency Medicine | Admitting: Emergency Medicine

## 2014-03-06 ENCOUNTER — Encounter (HOSPITAL_COMMUNITY): Payer: Self-pay | Admitting: Emergency Medicine

## 2014-03-06 ENCOUNTER — Emergency Department (HOSPITAL_COMMUNITY): Payer: Medicaid Other

## 2014-03-06 DIAGNOSIS — W19XXXA Unspecified fall, initial encounter: Secondary | ICD-10-CM

## 2014-03-06 DIAGNOSIS — Z79899 Other long term (current) drug therapy: Secondary | ICD-10-CM | POA: Insufficient documentation

## 2014-03-06 DIAGNOSIS — R296 Repeated falls: Secondary | ICD-10-CM | POA: Insufficient documentation

## 2014-03-06 DIAGNOSIS — M129 Arthropathy, unspecified: Secondary | ICD-10-CM | POA: Insufficient documentation

## 2014-03-06 DIAGNOSIS — Z8679 Personal history of other diseases of the circulatory system: Secondary | ICD-10-CM | POA: Insufficient documentation

## 2014-03-06 DIAGNOSIS — Y939 Activity, unspecified: Secondary | ICD-10-CM | POA: Insufficient documentation

## 2014-03-06 DIAGNOSIS — F429 Obsessive-compulsive disorder, unspecified: Secondary | ICD-10-CM | POA: Insufficient documentation

## 2014-03-06 DIAGNOSIS — F3289 Other specified depressive episodes: Secondary | ICD-10-CM | POA: Insufficient documentation

## 2014-03-06 DIAGNOSIS — S6990XA Unspecified injury of unspecified wrist, hand and finger(s), initial encounter: Secondary | ICD-10-CM | POA: Insufficient documentation

## 2014-03-06 DIAGNOSIS — Z862 Personal history of diseases of the blood and blood-forming organs and certain disorders involving the immune mechanism: Secondary | ICD-10-CM | POA: Insufficient documentation

## 2014-03-06 DIAGNOSIS — M79643 Pain in unspecified hand: Secondary | ICD-10-CM

## 2014-03-06 DIAGNOSIS — F411 Generalized anxiety disorder: Secondary | ICD-10-CM | POA: Insufficient documentation

## 2014-03-06 DIAGNOSIS — Y929 Unspecified place or not applicable: Secondary | ICD-10-CM | POA: Insufficient documentation

## 2014-03-06 DIAGNOSIS — Z8639 Personal history of other endocrine, nutritional and metabolic disease: Secondary | ICD-10-CM | POA: Insufficient documentation

## 2014-03-06 DIAGNOSIS — Z7982 Long term (current) use of aspirin: Secondary | ICD-10-CM | POA: Insufficient documentation

## 2014-03-06 DIAGNOSIS — Z8669 Personal history of other diseases of the nervous system and sense organs: Secondary | ICD-10-CM | POA: Insufficient documentation

## 2014-03-06 DIAGNOSIS — F329 Major depressive disorder, single episode, unspecified: Secondary | ICD-10-CM | POA: Insufficient documentation

## 2014-03-06 NOTE — Discharge Instructions (Signed)
Fall Prevention and Home Safety Falls cause injuries and can affect all age groups. It is possible to prevent falls.  HOW TO PREVENT FALLS  Wear shoes with rubber soles that do not have an opening for your toes.  Keep the inside and outside of your house well lit.  Use night lights throughout your home.  Remove clutter from floors.  Clean up floor spills.  Remove throw rugs or fasten them to the floor with carpet tape.  Do not place electrical cords across pathways.  Put grab bars by your tub, shower, and toilet. Do not use towel bars as grab bars.  Put handrails on both sides of the stairway. Fix loose handrails.  Do not climb on stools or stepladders, if possible.  Do not wax your floors.  Repair uneven or unsafe sidewalks, walkways, or stairs.  Keep items you use a lot within reach.  Be aware of pets.  Keep emergency numbers next to the telephone.  Put smoke detectors in your home and near bedrooms. Ask your doctor what other things you can do to prevent falls. Document Released: 09/17/2009 Document Revised: 05/22/2012 Document Reviewed: 02/21/2012 [facility] Patient Information 2014 [place], [place].   Musculoskeletal Pain Musculoskeletal pain is muscle and boney aches and pains. These pains can occur in any part of the body. Your caregiver may treat you without knowing the cause of the pain. They may treat you if blood or urine tests, X-rays, and other tests were normal.  CAUSES There is often not a definite cause or reason for these pains. These pains may be caused by a type of germ (virus). The discomfort may also come from overuse. Overuse includes working out too hard when your body is not fit. Boney aches also come from weather changes. Bone is sensitive to atmospheric pressure changes. HOME CARE INSTRUCTIONS   Ask when your test results will be ready. Make sure you get your test results.  Only take over-the-counter or prescription medicines for pain,  discomfort, or fever as directed by your caregiver. If you were given medications for your condition, do not drive, operate machinery or power tools, or sign legal documents for 24 hours. Do not drink alcohol. Do not take sleeping pills or other medications that may interfere with treatment.  Continue all activities unless the activities cause more pain. When the pain lessens, slowly resume normal activities. Gradually increase the intensity and duration of the activities or exercise.  During periods of severe pain, bed rest may be helpful. Lay or sit in any position that is comfortable.  Putting ice on the injured area.  Put ice in a bag.  Place a towel between your skin and the bag.  Leave the ice on for 15 to 20 minutes, 3 to 4 times a day.  Follow up with your caregiver for continued problems and no reason can be found for the pain. If the pain becomes worse or does not go away, it may be necessary to repeat tests or do additional testing. Your caregiver may need to look further for a possible cause. SEEK IMMEDIATE MEDICAL CARE IF:  You have pain that is getting worse and is not relieved by medications.  You develop chest pain that is associated with shortness or breath, sweating, feeling sick to your stomach (nauseous), or throw up (vomit).  Your pain becomes localized to the abdomen.  You develop any new symptoms that seem different or that concern you. MAKE SURE YOU:   Understand these instructions.  Will  watch your condition.  Will get help right away if you are not doing well or get worse. Document Released: 11/21/2005 Document Revised: 02/13/2012 Document Reviewed: 07/26/2013 [facility] Patient Information 2014 [place].

## 2014-03-06 NOTE — ED Notes (Signed)
Pt fell onto hand last night; iced it but worsening pain. Full wrist repair 2011.

## 2014-03-06 NOTE — ED Notes (Signed)
PA at bedside.

## 2014-03-06 NOTE — Progress Notes (Signed)
Orthopedic Tech Progress Note Patient Details:  [facility] 11-Aug-1966 [id number]  Ortho Devices Type of Ortho Device: Thumb velcro splint Ortho Device/Splint Location: RUE Ortho Device/Splint Interventions: Ordered;Application   Braulio Bosch 03/06/2014, 10:57 PM

## 2014-03-06 NOTE — ED Notes (Signed)
Ortho paged. 

## 2014-03-06 NOTE — ED Provider Notes (Signed)
CSN: [id number]     Arrival date & time 03/06/14  2141 History  This chart was scribed for non-physician practitioner, Delos Haring, PA-C working with Orpah Greek, MD by Frederich Balding, ED scribe. This patient was seen in room TR09C/TR09C and the patient's care was started at 10:46 PM.   Chief Complaint  Patient presents with  . Hand Injury   The history is provided by the patient. No language interpreter was used.   HPI Comments: JAESHAUN RIVA is a 48 y.o. male who presents to the Emergency Department complaining of hand injury that occurred last night. Pt states he fell onto his right hand with the thumb taking most of the impact. He has sudden onset, worsening hand pain with associated swelling. Pt has iced with no relief. He has shattered his right hand in the past and has 30% disability because of it. Denies head or neck injury. No LOC. Mainly wants an MRI of his hand to see if he damaged his ligament.  Past Medical History  Diagnosis Date  . Lens disease   . Brain bleed   . Cataract     bilateral repair with lens implants  . Depression   . Anxiety   . OCD (obsessive compulsive disorder)   . Neuromuscular disorder   . Homocystinuria   . Arthritis    Past Surgical History  Procedure Laterality Date  . 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 Wrist surgery Right   . Dermabrasion of face      due to acne scars  . Eye surgery      LASER + SURG BIL   . Pars plana vitrectomy Right 09/17/2013    Procedure: PARS PLANA VITRECTOMY WITH 25G REMOVAL/SUTURE SECONDARY INTRAOCULAR LENS;  Surgeon: Hayden Pedro, MD;  Location: [place];  Service: Ophthalmology;  Laterality: Right;  . Photocoagulation Right 09/17/2013    Procedure: PHOTOCOAGULATION;  Surgeon: Hayden Pedro, MD;  Location: [place];  Service: Ophthalmology;  Laterality: Right;  HEADSCOPE LASER  . Gas/fluid exchange Right 09/17/2013    Procedure: GAS/FLUID EXCHANGE;  Surgeon:  Hayden Pedro, MD;  Location: [place];  Service: Ophthalmology;  Laterality: Right;   History reviewed. No pertinent family history. History  Substance Use Topics  . Smoking status: Never Smoker   . Smokeless tobacco: Not on file  . Alcohol Use: No    Review of Systems  Musculoskeletal: Positive for arthralgias and joint swelling.  All other systems reviewed and are negative.   Allergies  Pineapple and Amoxicillin  Home Medications   Current Outpatient Rx  Name  Route  Sig  Dispense  Refill  . ALPRAZolam (XANAX) 0.5 MG tablet   Oral   Take 0.5 mg by mouth 3 (three) times daily.         Marland Kitchen amLODipine (NORVASC) 5 MG tablet   Oral   Take 1 tablet (5 mg total) by mouth 2 (two) times daily.   180 tablet   1   . ARIPiprazole (ABILIFY) 5 MG tablet   Oral   Take 5 mg by mouth daily.         Marland Kitchen aspirin EC 81 MG tablet   Oral   Take 81 mg by mouth daily.         Marland Kitchen atorvastatin (LIPITOR) 10 MG tablet   Oral   Take 10 mg by mouth daily.         . Betaine (CYSTADANE)  POWD   Oral   Take 7 scoop by mouth 3 (three) times daily. Mix with 4-6 ounces of water For Homocysteneria         . cycloSPORINE (RESTASIS) 0.05 % ophthalmic emulsion   Both Eyes   Place 1 drop into both eyes 2 (two) times daily.         . Fluvoxamine Maleate 150 MG CP24   Oral   Take 300 mg by mouth every morning.          . folic acid (FOLVITE) 109 MCG tablet   Oral   Take 1,200 mcg by mouth daily.         Marland Kitchen losartan-hydrochlorothiazide (HYZAAR) 100-12.5 MG per tablet   Oral   Take 1 tablet by mouth daily.   90 tablet   1   . Pyridoxine HCl (VITAMIN B-6) 500 MG tablet   Oral   Take 500 mg by mouth daily.         . traZODone (DESYREL) 100 MG tablet   Oral   Take 200 mg by mouth at bedtime.         . vitamin B-12 (CYANOCOBALAMIN) 1000 MCG tablet   Oral   Take 1,000 mcg by mouth daily.          BP 141/84  Pulse 105  Temp(Src) 97.7 F (36.5 C) (Oral)  Resp 18  Ht  6\' 3"  (1.905 m)  Wt 268 lb (121.564 kg)  BMI 33.50 kg/m2  SpO2 98%  Physical Exam  Nursing note and vitals reviewed. Constitutional: He is oriented to person, place, and time. He appears well-developed and well-nourished. No distress.  HENT:  Head: Normocephalic and atraumatic.  Eyes: EOM are normal.  Neck: Neck supple. No tracheal deviation present.  Cardiovascular: Normal rate.   Pulmonary/Chest: Effort normal. No respiratory distress.  Musculoskeletal:       Right hand: He exhibits decreased range of motion (due to pain, pt does have extension and flexion of all five fingers, but they are decreased) and tenderness. He exhibits no bony tenderness, normal two-point discrimination, normal capillary refill, no deformity, no laceration and no swelling. Normal sensation noted. Normal strength noted.  Neurological: He is alert and oriented to person, place, and time.  Skin: Skin is warm and dry.  Psychiatric: He has a normal mood and affect. His behavior is normal.    ED Course  Procedures (including critical care time)  DIAGNOSTIC STUDIES: Oxygen Saturation is 98% on RA, normal by my interpretation.    COORDINATION OF CARE: 10:49 PM-Discussed treatment plan which includes a wrist splint with pt at bedside and pt agreed to plan. Advised pt to follow up with a hand specialist. NO emergency findings on physical exam that would suggest need for emergent MRI or emergent intervention.  Labs Review Labs Reviewed - No data to display Imaging Review Dg Hand Complete Right  03/06/2014   CLINICAL DATA:  Status post fall on right hand; right posterior lateral hand and thumb pain.  EXAM: RIGHT HAND - COMPLETE 3+ VIEW  COMPARISON:  Right wrist radiographs performed 03/05/2014  FINDINGS: There is no evidence of fracture or dislocation. The joint spaces are preserved; the soft tissues are unremarkable in appearance.  Mild apparent widening of the scapholunate interspace is better characterized on  recent wrist radiograph. A plate and screws are again noted along the distal radius. The carpal rows are otherwise grossly intact.  IMPRESSION: No evidence of fracture or dislocation.   Electronically Signed  By: Garald Balding M.D.   On: 03/06/2014 22:16     EKG Interpretation None      MDM   Final diagnoses:  Fall  Hand pain    48 y.o.Swade Shonka Julin's evaluation in the Emergency Department is complete. It has been determined that no acute conditions requiring further emergency intervention are present at this time. The patient/guardian have been advised of the diagnosis and plan. We have discussed signs and symptoms that warrant return to the ED, such as changes or worsening in symptoms.  Vital signs are stable at discharge. Filed Vitals:   03/06/14 2313  BP: 106/74  Pulse: 94  Temp: 98.4 F (36.9 C)  Resp: 16    Patient/guardian has voiced understanding and agreed to follow-up with the PCP or specialist.   I personally performed the services described in this documentation, which was scribed in my presence. The recorded information has been reviewed and is accurate.po  Linus Mako, PA-C 03/07/14 [phone]

## 2014-03-08 NOTE — ED Provider Notes (Signed)
Medical screening examination/treatment/procedure(s) were performed by non-physician practitioner and as supervising physician I was immediately available for consultation/collaboration.   EKG Interpretation None        Orpah Greek, MD 03/08/14 [phone]

## 2014-03-21 ENCOUNTER — Emergency Department: Payer: Self-pay | Admitting: Emergency Medicine

## 2014-03-24 ENCOUNTER — Emergency Department: Payer: Self-pay | Admitting: Emergency Medicine

## 2014-03-25 ENCOUNTER — Emergency Department (HOSPITAL_COMMUNITY)
Admission: EM | Admit: 2014-03-25 | Discharge: 2014-03-26 | Disposition: A | Discharge status: Home / Self Care | Payer: Medicaid Other | Attending: Emergency Medicine | Admitting: Emergency Medicine

## 2014-03-25 ENCOUNTER — Telehealth: Payer: Self-pay

## 2014-03-25 DIAGNOSIS — F3289 Other specified depressive episodes: Secondary | ICD-10-CM | POA: Insufficient documentation

## 2014-03-25 DIAGNOSIS — Z7982 Long term (current) use of aspirin: Secondary | ICD-10-CM | POA: Insufficient documentation

## 2014-03-25 DIAGNOSIS — R000 Tachycardia, unspecified: Secondary | ICD-10-CM | POA: Insufficient documentation

## 2014-03-25 DIAGNOSIS — Z8669 Personal history of other diseases of the nervous system and sense organs: Secondary | ICD-10-CM | POA: Insufficient documentation

## 2014-03-25 DIAGNOSIS — M129 Arthropathy, unspecified: Secondary | ICD-10-CM | POA: Insufficient documentation

## 2014-03-25 DIAGNOSIS — F411 Generalized anxiety disorder: Secondary | ICD-10-CM | POA: Insufficient documentation

## 2014-03-25 DIAGNOSIS — F329 Major depressive disorder, single episode, unspecified: Secondary | ICD-10-CM | POA: Insufficient documentation

## 2014-03-25 DIAGNOSIS — F429 Obsessive-compulsive disorder, unspecified: Secondary | ICD-10-CM | POA: Insufficient documentation

## 2014-03-25 DIAGNOSIS — Z86718 Personal history of other venous thrombosis and embolism: Secondary | ICD-10-CM | POA: Insufficient documentation

## 2014-03-25 DIAGNOSIS — M79609 Pain in unspecified limb: Secondary | ICD-10-CM | POA: Insufficient documentation

## 2014-03-25 DIAGNOSIS — Z79899 Other long term (current) drug therapy: Secondary | ICD-10-CM | POA: Insufficient documentation

## 2014-03-25 DIAGNOSIS — Z880 Allergy status to penicillin: Secondary | ICD-10-CM | POA: Insufficient documentation

## 2014-03-25 DIAGNOSIS — M79661 Pain in right lower leg: Secondary | ICD-10-CM

## 2014-03-25 NOTE — Telephone Encounter (Signed)
PA is needed by [facility] for losartan-hctz. Called medicaid and they asked if pt has ever tried/failed an ACE inhibitor. Printed off form to be sent after getting this info from pt. LMOM for pt to CB.

## 2014-03-25 NOTE — ED Notes (Signed)
Pt reports throbbing right calf pain for the past 3-4 weeks. Pt states he has a hx of blood clot in 1997. Pt reports leg is hot and swollen. Pt A&Ox4, respirations equal and unlabored, skin warm and dry

## 2014-03-25 NOTE — ED Notes (Signed)
Pt reports right lower leg swelling and pain x 3-4 weeks. States that he has a brain clot disorder, but no clots in legs in past.

## 2014-03-25 NOTE — Telephone Encounter (Signed)
Pt CB and reported that he did try some other medications while in [place]. The ACE inhibitors he tried caused him to cough. He thinks two of the ones he had tried were the Lotensin and enalapril. I completed Medicaid form and faxed into Medicaid.

## 2014-03-25 NOTE — ED Provider Notes (Signed)
CSN: [id number]     Arrival date & time 03/25/14  2236 History   First MD Initiated Contact with Patient 03/25/14 2249     Chief Complaint  Patient presents with  . Leg Pain     (Consider location/radiation/quality/duration/timing/severity/associated sxs/prior Treatment) HPI Comments: Patient with history of homocystinuria, bilateral peripheral neuropathy of the lower extremities currently being worked up -- presents with complaint of right calf pain for the past 3 weeks. Soreness is worse with weightbearing. He does not endorse claudication. Patient states that the calf feels swollen to him. He denies color change. He does not have a history of a DVT or PE. He does state that he had a previous blood clot in his brain. Patient denies risk factors for pulmonary embolism including: unilateral leg swelling, recent immobilizations, recent surgery, recent travel (>4hr segment), malignancy, hemoptysis. No treatments prior to arrival. Onset of symptoms gradual. Course is constant.    Patient is a 48 y.o. male presenting with leg pain. The history is provided by the patient and medical records.  Leg Pain Associated symptoms: no fever     Past Medical History  Diagnosis Date  . Lens disease   . Brain bleed   . Cataract     bilateral repair with lens implants  . Depression   . Anxiety   . OCD (obsessive compulsive disorder)   . Neuromuscular disorder   . Homocystinuria   . Arthritis    Past Surgical History  Procedure Laterality Date  . 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 Wrist surgery Right   . Dermabrasion of face      due to acne scars  . Eye surgery      LASER + SURG BIL   . Pars plana vitrectomy Right 09/17/2013    Procedure: PARS PLANA VITRECTOMY WITH 25G REMOVAL/SUTURE SECONDARY INTRAOCULAR LENS;  Surgeon: Hayden Pedro, MD;  Location: [place];  Service: Ophthalmology;  Laterality: Right;  . Photocoagulation Right 09/17/2013     Procedure: PHOTOCOAGULATION;  Surgeon: Hayden Pedro, MD;  Location: [place];  Service: Ophthalmology;  Laterality: Right;  HEADSCOPE LASER  . Gas/fluid exchange Right 09/17/2013    Procedure: GAS/FLUID EXCHANGE;  Surgeon: Hayden Pedro, MD;  Location: [place];  Service: Ophthalmology;  Laterality: Right;   No family history on file. History  Substance Use Topics  . Smoking status: Never Smoker   . Smokeless tobacco: Not on file  . Alcohol Use: No    Review of Systems  Constitutional: Negative for fever.  HENT: Negative for rhinorrhea and sore throat.   Eyes: Negative for redness.  Respiratory: Negative for cough and shortness of breath.   Cardiovascular: Positive for leg swelling. Negative for chest pain.  Gastrointestinal: Negative for nausea, vomiting, abdominal pain and diarrhea.  Genitourinary: Negative for dysuria.  Musculoskeletal: Positive for myalgias.  Skin: Negative for rash.  Neurological: Negative for headaches.   Allergies  Pineapple and Amoxicillin  Home Medications   Prior to Admission medications   Medication Sig Start Date End Date Taking? Authorizing Provider  ALPRAZolam Duanne Moron) 0.5 MG tablet Take 0.5 mg by mouth 3 (three) times daily.   Yes Historical Provider, MD  amLODipine (NORVASC) 5 MG tablet Take 1 tablet (5 mg total) by mouth 2 (two) times daily. 01/02/14  Yes Chelle S Jeffery, PA-C  aspirin EC 81 MG tablet Take 81 mg by mouth daily.   Yes Historical Provider, MD  atorvastatin (LIPITOR) 10  MG tablet Take 10 mg by mouth daily.   Yes Historical Provider, MD  Betaine (CYSTADANE) POWD Take 7 scoop by mouth 3 (three) times daily. Mix with 4-6 ounces of water For Homocysteneria   Yes Historical Provider, MD  cycloSPORINE (RESTASIS) 0.05 % ophthalmic emulsion Place 1 drop into both eyes 2 (two) times daily.   Yes Historical Provider, MD  FLUoxetine (PROZAC) 20 MG capsule Take 20 mg by mouth daily.   Yes Historical Provider, MD  fluvoxaMINE (LUVOX) 100 MG  tablet Take 100 mg by mouth daily.   Yes Historical Provider, MD  folic acid (FOLVITE) 834 MCG tablet Take 1,200 mcg by mouth daily.   Yes Historical Provider, MD  gabapentin (NEURONTIN) 100 MG capsule Take 100 mg by mouth 3 (three) times daily.   Yes Historical Provider, MD  losartan-hydrochlorothiazide (HYZAAR) 100-12.5 MG per tablet Take 1 tablet by mouth daily. 01/02/14  Yes Chelle S Jeffery, PA-C  pyridoxine (B-6) 100 MG tablet Take 100 mg by mouth daily.   Yes Historical Provider, MD  risperiDONE (RISPERDAL) 2 MG tablet Take 2 mg by mouth at bedtime.   Yes Historical Provider, MD  traZODone (DESYREL) 100 MG tablet Take 200 mg by mouth at bedtime.   Yes Historical Provider, MD  vitamin B-12 (CYANOCOBALAMIN) 1000 MCG tablet Take 1,000 mcg by mouth daily.   Yes Historical Provider, MD   BP 140/85  Pulse 108  Temp(Src) 98.2 F (36.8 C) (Oral)  Resp 20  Ht 6\' 3"  (1.905 m)  Wt 281 lb (127.461 kg)  BMI 35.12 kg/m2  SpO2 96%  Physical Exam  Nursing note and vitals reviewed. Constitutional: He appears well-developed and well-nourished.  HENT:  Head: Normocephalic and atraumatic.  Eyes: Conjunctivae are normal. Right eye exhibits no discharge. Left eye exhibits no discharge.  Neck: Normal range of motion. Neck supple.  Cardiovascular: Regular rhythm.  Tachycardia present.   No murmur heard. Pulses:      Dorsalis pedis pulses are 2+ on the right side, and 2+ on the left side.       Posterior tibial pulses are 2+ on the right side, and 2+ on the left side.  Heart rate 102  Pulmonary/Chest: Effort normal and breath sounds normal. No respiratory distress. He has no wheezes. He has no rales.  Abdominal: Soft. There is no tenderness.  Musculoskeletal: He exhibits tenderness.  R calf tenderness to palpation, mild. No thigh tenderness. R calf is trace larger than L. L LE is normal.   Neurological: He is alert.  Skin: Skin is warm and dry.  Psychiatric: He has a normal mood and affect.     ED Course  Procedures (including critical care time) Labs Review Labs Reviewed - No data to display  Imaging Review No results found.   EKG Interpretation None      11:42 PM Patient seen and examined.    Vital signs reviewed and are as follows: Filed Vitals:   03/25/14 2303  BP: 140/85  Pulse: 108  Temp:   Resp:   BP 140/85  Pulse 108  Temp(Src) 98.2 F (36.8 C) (Oral)  Resp 20  Ht 6\' 3"  (1.905 m)  Wt 281 lb (127.461 kg)  BMI 35.12 kg/m2  SpO2 96%  12:43 AM Patient discussed with and seen by Dr. Roxanne Mins. Will give Lovenox and order [place] for AM.   Patient given instructions to return to Admitting at 8am for his study.   Patient urged to follow-up/return with worsening symptoms or other concerns.  Patient verbalized understanding and agrees with plan.   BP 136/80  Pulse 94  Temp(Src) 98.2 F (36.8 C) (Oral)  Resp 20  Ht 6\' 3"  (1.905 m)  Wt 281 lb (127.461 kg)  BMI 35.12 kg/m2  SpO2 95%   MDM   Final diagnoses:  Tenderness of right calf   Patient with calf swelling/tenderness. No cellulitis. Normal pedal pulses. Do not suspect arterial insufficiency. Compartments are soft. No risk factors for DVT, however 1 dose Lovenox given and study arranged for AM to rule out.   Of note, tachycardic at arrival. Patient is anxious. HR improved during ED stay. No CP/SOB to suggest PE and I do not suspect PE.   No dangerous or life-threatening conditions suspected or identified by history, physical exam, and by work-up. No indications for hospitalization identified.      Carlisle Cater, PA-C 03/26/14 [phone]

## 2014-03-26 ENCOUNTER — Emergency Department (HOSPITAL_COMMUNITY)
Admission: RE | Admit: 2014-03-26 | Discharge: 2014-03-26 | Disposition: A | Discharge status: Home / Self Care | Payer: Medicaid Other | Source: Ambulatory Visit | Attending: Emergency Medicine | Admitting: Emergency Medicine

## 2014-03-26 DIAGNOSIS — M79609 Pain in unspecified limb: Secondary | ICD-10-CM

## 2014-03-26 MED ORDER — ENOXAPARIN SODIUM 120 MG/0.8ML ~~LOC~~ SOLN
120.0000 mg | Freq: Once | SUBCUTANEOUS | Status: AC
  Administered 2014-03-26: 120 mg via SUBCUTANEOUS
  Filled 2014-03-26: qty 0.8

## 2014-03-26 NOTE — Discharge Instructions (Signed)
Please read and follow all provided instructions.  Your diagnoses today include:  1. Tenderness of right calf     Tests performed today include:  Vital signs. See below for your results today.   Medications prescribed:   None  Take any prescribed medications only as directed.  Additional Information:  Follow any educational materials contained in this packet.  Although you appear stable for discharge, you may still have a blood clot in your leg(s) called a DVT (deep venous thrombosis).  You may have received initial treatment with an injection of a blood thinner to treat DVTs, but low risk patients do not always get treated before an ultrasound is performed to diagnose or rule out a DVT, due to the risks of bleeding from the medication used.  It is important you follow up for an ultrasound within one day as directed.  Follow-up instructions: Please return to the hospital tomorrow morning for your ultrasound as directed.  Please follow-up with your primary care provider in the next 1-2 days for further evaluation of your symptoms. If you do not have a primary care doctor -- see below for referral information.   Return instructions:   Please return to the Emergency Department if you experience worsening symptoms.  Return immediately if you develop chest pain, shortness of breath, dizziness or fainting.  Return with new color change or weakness/numbness to your affected leg(s).  Return with bleeding, severe headache, confusion or altered mental status.  Please return if you have any other emergent concerns.  Additional Information:  Your vital signs today were: BP 124/79   Pulse 98   Temp(Src) 98.2 F (36.8 C) (Oral)   Resp 20   Ht 6\' 3"  (1.905 m)   Wt 281 lb (127.461 kg)   BMI 35.12 kg/m2   SpO2 96% If your blood pressure (BP) was elevated above 135/85 this visit, please have this repeated by your doctor within one month. --------------

## 2014-03-26 NOTE — Progress Notes (Signed)
VASCULAR LAB PRELIMINARY  PRELIMINARY  PRELIMINARY  PRELIMINARY  Right lower extremity venous duplex completed.    Preliminary report:  Right:  No evidence of DVT, superficial thrombosis, or Baker's cyst.  Left:  No evidence of DVT in the CFV.  Nani Ravens, RVT 03/26/2014, 8:21 AM

## 2014-03-26 NOTE — ED Provider Notes (Signed)
48 year old male comes in with a two week history of right calf pain. There is no history of trauma. On exam, there is mild calf tenderness, negative Homan's Sign. Right calf circumference is 1 cm greater than left calf circumference. He will be brought back in the morning for venous doppler testing.  Medical screening examination/treatment/procedure(s) were conducted as a shared visit with non-physician practitioner(s) and myself.  I personally evaluated the patient during the encounter.   Delora Fuel, MD 80/22/33 6122

## 2014-04-04 DIAGNOSIS — G603 Idiopathic progressive neuropathy: Secondary | ICD-10-CM | POA: Insufficient documentation

## 2014-04-04 DIAGNOSIS — G629 Polyneuropathy, unspecified: Secondary | ICD-10-CM | POA: Insufficient documentation

## 2014-04-15 ENCOUNTER — Emergency Department: Payer: Self-pay | Admitting: Emergency Medicine

## 2014-05-28 ENCOUNTER — Ambulatory Visit: Payer: Self-pay

## 2014-08-14 ENCOUNTER — Encounter (HOSPITAL_COMMUNITY): Payer: Self-pay | Admitting: Emergency Medicine

## 2014-08-14 ENCOUNTER — Emergency Department (HOSPITAL_COMMUNITY): Payer: Medicaid Other

## 2014-08-14 ENCOUNTER — Inpatient Hospital Stay (HOSPITAL_COMMUNITY)
Admission: EM | Admit: 2014-08-14 | Discharge: 2014-08-16 | DRG: 123 | Disposition: A | Discharge status: Home / Self Care | Payer: Medicaid Other | Attending: Internal Medicine | Admitting: Internal Medicine

## 2014-08-14 DIAGNOSIS — R200 Anesthesia of skin: Secondary | ICD-10-CM

## 2014-08-14 DIAGNOSIS — I639 Cerebral infarction, unspecified: Secondary | ICD-10-CM | POA: Insufficient documentation

## 2014-08-14 DIAGNOSIS — H532 Diplopia: Principal | ICD-10-CM | POA: Diagnosis present

## 2014-08-14 DIAGNOSIS — E782 Mixed hyperlipidemia: Secondary | ICD-10-CM | POA: Diagnosis present

## 2014-08-14 DIAGNOSIS — Z79899 Other long term (current) drug therapy: Secondary | ICD-10-CM

## 2014-08-14 DIAGNOSIS — Z8673 Personal history of transient ischemic attack (TIA), and cerebral infarction without residual deficits: Secondary | ICD-10-CM

## 2014-08-14 DIAGNOSIS — F329 Major depressive disorder, single episode, unspecified: Secondary | ICD-10-CM | POA: Diagnosis present

## 2014-08-14 DIAGNOSIS — F429 Obsessive-compulsive disorder, unspecified: Secondary | ICD-10-CM | POA: Diagnosis present

## 2014-08-14 DIAGNOSIS — E7210 Disorders of sulfur-bearing amino-acid metabolism, unspecified: Secondary | ICD-10-CM | POA: Diagnosis present

## 2014-08-14 DIAGNOSIS — F411 Generalized anxiety disorder: Secondary | ICD-10-CM | POA: Diagnosis present

## 2014-08-14 DIAGNOSIS — Z7982 Long term (current) use of aspirin: Secondary | ICD-10-CM

## 2014-08-14 DIAGNOSIS — G609 Hereditary and idiopathic neuropathy, unspecified: Secondary | ICD-10-CM | POA: Diagnosis present

## 2014-08-14 DIAGNOSIS — F3289 Other specified depressive episodes: Secondary | ICD-10-CM | POA: Diagnosis present

## 2014-08-14 DIAGNOSIS — R000 Tachycardia, unspecified: Secondary | ICD-10-CM | POA: Diagnosis present

## 2014-08-14 DIAGNOSIS — I498 Other specified cardiac arrhythmias: Secondary | ICD-10-CM | POA: Diagnosis present

## 2014-08-14 DIAGNOSIS — E785 Hyperlipidemia, unspecified: Secondary | ICD-10-CM | POA: Diagnosis present

## 2014-08-14 DIAGNOSIS — R209 Unspecified disturbances of skin sensation: Secondary | ICD-10-CM | POA: Diagnosis present

## 2014-08-14 DIAGNOSIS — I10 Essential (primary) hypertension: Secondary | ICD-10-CM | POA: Diagnosis present

## 2014-08-14 DIAGNOSIS — I6350 Cerebral infarction due to unspecified occlusion or stenosis of unspecified cerebral artery: Secondary | ICD-10-CM

## 2014-08-14 HISTORY — DX: Essential (primary) hypertension: I10

## 2014-08-14 HISTORY — DX: Cerebral infarction, unspecified: I63.9

## 2014-08-14 LAB — DIFFERENTIAL
BASOS ABS: 0 10*3/uL (ref 0.0–0.1)
Basophils Relative: 0 % (ref 0–1)
Eosinophils Absolute: 0.1 10*3/uL (ref 0.0–0.7)
Eosinophils Relative: 1 % (ref 0–5)
LYMPHS ABS: 3.8 10*3/uL (ref 0.7–4.0)
LYMPHS PCT: 44 % (ref 12–46)
Monocytes Absolute: 0.8 10*3/uL (ref 0.1–1.0)
Monocytes Relative: 10 % (ref 3–12)
NEUTROS ABS: 3.8 10*3/uL (ref 1.7–7.7)
NEUTROS PCT: 45 % (ref 43–77)

## 2014-08-14 LAB — I-STAT CHEM 8, ED
BUN: 10 mg/dL (ref 6–23)
Calcium, Ion: 1.13 mmol/L (ref 1.12–1.23)
Chloride: 99 mEq/L (ref 96–112)
Creatinine, Ser: 1.2 mg/dL (ref 0.50–1.35)
GLUCOSE: 119 mg/dL — AB (ref 70–99)
HCT: 45 % (ref 39.0–52.0)
HEMOGLOBIN: 15.3 g/dL (ref 13.0–17.0)
Potassium: 3.6 mEq/L — ABNORMAL LOW (ref 3.7–5.3)
Sodium: 137 mEq/L (ref 137–147)
TCO2: 27 mmol/L (ref 0–100)

## 2014-08-14 LAB — COMPREHENSIVE METABOLIC PANEL
ALT: 57 U/L — AB (ref 0–53)
AST: 52 U/L — AB (ref 0–37)
Albumin: 4.1 g/dL (ref 3.5–5.2)
Alkaline Phosphatase: 59 U/L (ref 39–117)
Anion gap: 16 — ABNORMAL HIGH (ref 5–15)
BUN: 11 mg/dL (ref 6–23)
CALCIUM: 9.3 mg/dL (ref 8.4–10.5)
CHLORIDE: 96 meq/L (ref 96–112)
CO2: 25 mEq/L (ref 19–32)
Creatinine, Ser: 1.02 mg/dL (ref 0.50–1.35)
GFR calc Af Amer: >90 mL/min (ref >90)
GFR calc non Af Amer: 86 mL/min — ABNORMAL LOW (ref >90)
GLUCOSE: 116 mg/dL — AB (ref 70–99)
Potassium: 4.1 mEq/L (ref 3.7–5.3)
SODIUM: 137 meq/L (ref 137–147)
Total Bilirubin: 0.3 mg/dL (ref 0.3–1.2)
Total Protein: 8.2 g/dL (ref 6.0–8.3)

## 2014-08-14 LAB — I-STAT TROPONIN, ED: Troponin i, poc: 0 ng/mL (ref 0.00–0.08)

## 2014-08-14 LAB — PROTIME-INR
INR: 1.13 (ref 0.00–1.49)
Prothrombin Time: 14.5 seconds (ref 11.6–15.2)

## 2014-08-14 LAB — ETHANOL: Alcohol, Ethyl (B): <11 mg/dL (ref 0–11)

## 2014-08-14 LAB — CBC
HCT: 40.7 % (ref 39.0–52.0)
Hemoglobin: 14.4 g/dL (ref 13.0–17.0)
MCH: 31.6 pg (ref 26.0–34.0)
MCHC: 35.4 g/dL (ref 30.0–36.0)
MCV: 89.5 fL (ref 78.0–100.0)
Platelets: 209 10*3/uL (ref 150–400)
RBC: 4.55 MIL/uL (ref 4.22–5.81)
RDW: 13.3 % (ref 11.5–15.5)
WBC: 8.6 10*3/uL (ref 4.0–10.5)

## 2014-08-14 LAB — APTT: APTT: 32 s (ref 24–37)

## 2014-08-14 NOTE — ED Notes (Signed)
Pt. reports headache with dizziness onset today and double vision onset 2 days ago , Hear rate = 130's at triage , denies chest pain / respirations unlabored.

## 2014-08-14 NOTE — Consult Note (Signed)
Referring Physician: Lita Mains    Chief Complaint: Diplopia and RUE numbness  HPI: Jesus Crosby. is an 48 y.o. male who reports that he has had intermittent episodes of dizziness for the past 7-8 days.  Today while watching television noted that he right arm was numb.  He began to experience double vision as well.  Felt his speech was thicker than at baseline.  When symptoms did not resolve patient presented for evaluation.  He reports that he had a stroke in 1997 that affected his right side.  He has no residual from that stroke.  Was on Coumadin in the past but this was discontinued.    Date last known well: Date: 08/14/2014 Time last known well: Time: 16:00 tPA Given: No: Outside time window  Past Medical History  Diagnosis Date  . Lens disease   . Brain bleed   . Cataract     bilateral repair with lens implants  . Depression   . Anxiety   . OCD (obsessive compulsive disorder)   . Neuromuscular disorder   . Homocystinuria   . Arthritis   . Hypertension   . Stroke     Past Surgical History  Procedure Laterality Date  . 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 Wrist surgery Right   . Dermabrasion of face      due to acne scars  . Eye surgery      LASER + SURG BIL   . Pars plana vitrectomy Right 09/17/2013    Procedure: PARS PLANA VITRECTOMY WITH 25G REMOVAL/SUTURE SECONDARY INTRAOCULAR LENS;  Surgeon: Hayden Pedro, MD;  Location: [place];  Service: Ophthalmology;  Laterality: Right;  . Photocoagulation Right 09/17/2013    Procedure: PHOTOCOAGULATION;  Surgeon: Hayden Pedro, MD;  Location: [place];  Service: Ophthalmology;  Laterality: Right;  HEADSCOPE LASER  . Gas/fluid exchange Right 09/17/2013    Procedure: GAS/FLUID EXCHANGE;  Surgeon: Hayden Pedro, MD;  Location: [place];  Service: Ophthalmology;  Laterality: Right;    Family history: Both maternal and paternal grandparents has strokes.  Both parents have hypertension.  Both  are alive.    Social History:  reports that he has never smoked. He does not have any smokeless tobacco history on file. He reports that he does not drink alcohol or use illicit drugs.  Allergies:  Allergies  Allergen Reactions  . Pineapple Swelling    Lips swelling  . Amoxicillin Nausea And Vomiting    REACTION: Nausea and vomiting    Medications: I have reviewed the patient's current medications. Prior to Admission:  Current outpatient prescriptions:ALPRAZolam (XANAX) 0.5 MG tablet, Take 0.5 mg by mouth 3 (three) times daily., Disp: , Rfl: ;  amLODipine (NORVASC) 5 MG tablet, Take 1 tablet (5 mg total) by mouth 2 (two) times daily., Disp: 180 tablet, Rfl: 1;  aspirin EC 81 MG tablet, Take 81 mg by mouth daily., Disp: , Rfl: ;  atorvastatin (LIPITOR) 10 MG tablet, Take 10 mg by mouth daily., Disp: , Rfl:  Betaine (CYSTADANE) POWD, Take 7 scoop by mouth 3 (three) times daily. Mix with 4-6 ounces of water For Homocysteneria, Disp: , Rfl: ;  cycloSPORINE (RESTASIS) 0.05 % ophthalmic emulsion, Place 1 drop into both eyes 2 (two) times daily., Disp: , Rfl: ;  FLUoxetine (PROZAC) 20 MG capsule, Take 20 mg by mouth daily., Disp: , Rfl: ;  fluvoxaMINE (LUVOX) 100 MG tablet, Take 100 mg by mouth daily., Disp: ,  Rfl:  folic acid (FOLVITE) 378 MCG tablet, Take 1,200 mcg by mouth daily., Disp: , Rfl: ;  gabapentin (NEURONTIN) 100 MG capsule, Take 100 mg by mouth 3 (three) times daily., Disp: , Rfl: ;  losartan-hydrochlorothiazide (HYZAAR) 100-12.5 MG per tablet, Take 1 tablet by mouth daily., Disp: 90 tablet, Rfl: 1;  pyridoxine (B-6) 100 MG tablet, Take 100 mg by mouth daily., Disp: , Rfl:  risperiDONE (RISPERDAL) 2 MG tablet, Take 2 mg by mouth at bedtime., Disp: , Rfl: ;  traZODone (DESYREL) 100 MG tablet, Take 200 mg by mouth at bedtime., Disp: , Rfl: ;  vitamin B-12 (CYANOCOBALAMIN) 1000 MCG tablet, Take 1,000 mcg by mouth daily., Disp: , Rfl:   ROS: History obtained from the patient  General ROS:  negative for - chills, fatigue, fever, night sweats, weight gain or weight loss Psychological ROS: negative for - behavioral disorder, hallucinations, memory difficulties, mood swings or suicidal ideation Ophthalmic ROS: as noted in HPI ENT ROS: negative for - epistaxis, nasal discharge, oral lesions, sore throat, tinnitus or vertigo Allergy and Immunology ROS: negative for - hives or itchy/watery eyes Hematological and Lymphatic ROS: negative for - bleeding problems, bruising or swollen lymph nodes Endocrine ROS: negative for - galactorrhea, hair pattern changes, polydipsia/polyuria or temperature intolerance Respiratory ROS: negative for - cough, hemoptysis, shortness of breath or wheezing Cardiovascular ROS: negative for - chest pain, dyspnea on exertion, edema or irregular heartbeat Gastrointestinal ROS: negative for - abdominal pain, diarrhea, hematemesis, nausea/vomiting or stool incontinence Genito-Urinary ROS: negative for - dysuria, hematuria, incontinence or urinary frequency/urgency Musculoskeletal ROS: negative for - joint swelling or muscular weakness Neurological ROS: as noted in HPI Dermatological ROS: negative for rash and skin lesion changes  Physical Examination: Blood pressure 146/96, pulse 135, temperature 97.7 F (36.5 C), temperature source Oral, resp. rate 18, SpO2 95.00%.  Neurologic Examination: Mental Status: Alert, oriented, thought content appropriate.  Speech fluent without evidence of aphasia.  Mild dysarthria.  Able to follow 3 step commands without difficulty. Cranial Nerves: II: Discs flat bilaterally; Visual fields grossly normal, Left pupil irregular.  Right pupils round.  Both reactive to light and accommodation III,IV, VI: ptosis not present, extra-ocular motions intact bilaterally.  Patient reports bilateral monocular diplopia. V,VII: decrease in right NLF, facial light touch sensation decreased on the left. VIII: hearing normal bilaterally IX,X: gag  reflex present XI: bilateral shoulder shrug XII: midline tongue extension Motor: Right : Upper extremity   5/5    Left:     Upper extremity   5/5  Lower extremity   5/5     Lower extremity   5/5 Tone and bulk:normal tone throughout; no atrophy noted Sensory: Pinprick and light touch decreased on the RUE Deep Tendon Reflexes: 2+ and symmetric throughout Plantars: Right: downgoing   Left: downgoing Cerebellar: normal finger-to-nose and normal heel-to-shin testing Gait: unable to test CV: pulses palpable throughout    Laboratory Studies:  Basic Metabolic Panel:  Recent Labs Lab 08/14/14 2320  NA 137  K 3.6*  CL 99  GLUCOSE 119*  BUN 10  CREATININE 1.20    Liver Function Tests: No results found for this basename: AST, ALT, ALKPHOS, BILITOT, PROT, ALBUMIN,  in the last 168 hours No results found for this basename: LIPASE, AMYLASE,  in the last 168 hours No results found for this basename: AMMONIA,  in the last 168 hours  CBC:  Recent Labs Lab 08/14/14 2311 08/14/14 2320  WBC 8.6  --   NEUTROABS 3.8  --  HGB 14.4 15.3  HCT 40.7 45.0  MCV 89.5  --   PLT 209  --     Cardiac Enzymes: No results found for this basename: CKTOTAL, CKMB, CKMBINDEX, TROPONINI,  in the last 168 hours  BNP: No components found with this basename: POCBNP,   CBG: No results found for this basename: GLUCAP,  in the last 168 hours  Microbiology: No results found for this or any previous visit.  Coagulation Studies:  Recent Labs  08/14/14 2311  LABPROT 14.5  INR 1.13    Urinalysis: No results found for this basename: COLORURINE, APPERANCEUR, LABSPEC, PHURINE, GLUCOSEU, HGBUR, BILIRUBINUR, KETONESUR, PROTEINUR, UROBILINOGEN, NITRITE, LEUKOCYTESUR,  in the last 168 hours  Lipid Panel:    Component Value Date/Time   CHOL 165 05/28/2008 0818   TRIG 84 05/28/2008 0818   HDL 47.3 05/28/2008 0818   CHOLHDL 3.5 CALC 05/28/2008 0818   VLDL 17 05/28/2008 0818   LDLCALC 101* 05/28/2008  0818    HgbA1C:  No results found for this basename: HGBA1C    Urine Drug Screen:   No results found for this basename: labopia, cocainscrnur, labbenz, amphetmu, thcu, labbarb    Alcohol Level: No results found for this basename: ETH,  in the last 168 hours  Other results: EKG: sinus tachycardia at 135bpm.  Imaging: Ct Head Wo Contrast  08/14/2014   CLINICAL DATA:  Code stroke, right arm weakness  EXAM: CT HEAD WITHOUT CONTRAST  TECHNIQUE: Contiguous axial images were obtained from the base of the skull through the vertex without intravenous contrast.  COMPARISON:  CT head dated 03/23/2005  FINDINGS: No evidence of parenchymal hemorrhage or extra-axial fluid collection. No mass lesion, mass effect, or midline shift.  No CT evidence of acute infarction.  Cerebral volume is within normal limits.  No ventriculomegaly.  The visualized paranasal sinuses are essentially clear. The mastoid air cells are unopacified.  No evidence of calvarial fracture.  IMPRESSION: Normal head CT.  These results were called by telephone at the time of interpretation on 08/14/2014 at 11:22 pm to [facility], who verbally acknowledged these results.   Electronically Signed   By: Julian Hy M.D.   On: 08/14/2014 23:26    Assessment: 48 y.o. male presenting with complaints of acute onset diplopia and right upper extremity numbness.  On neurological examination patient also with right facial asymmetry and left facial numbness.  Diplopia monocular.  Symptoms do not correspond to one vascular territory but patient with multiple risk factors.  Head CT reviewed and shows no acute changes.    Stroke Risk Factors - hypertension and homocystinuria  Plan: 1. HgbA1c, fasting lipid panel 2. MRI of the brain without contrast 3. PT consult, OT consult, Speech consult 4. Echocardiogram 5. CTA of the head and neck 6. Prophylactic therapy-Continue ASA 7. Risk factor modification 8. Telemetry monitoring 9. Frequent neuro  checks  Case discussed with Dr. Exie Parody, MD [facility] [phone] 08/14/2014, 11:40 PM

## 2014-08-14 NOTE — ED Provider Notes (Signed)
CSN: [id number]     Arrival date & time 08/14/14  2246 History   First MD Initiated Contact with Patient 08/14/14 2301     Chief Complaint  Patient presents with  . Headache  . Dizziness  . Blurred Vision     (Consider location/radiation/quality/duration/timing/severity/associated sxs/prior Treatment) HPI Patient states for the last 2 days he has had dizziness which he describes as lightheadedness. It is worse with standing up. It resolves when sitting down and lying flat. He denies vertiginous symptoms. Patient states that at 4 PM today he noticed double vision in both eyes. He states the images are side-by-side. They're not worse with lateral gaze.  He also complains of decreased sensation in his right upper extremity. He denies any focal weakness. Admits to having a intracranial bleed back in 1997. He denies chest pain or shortness of breath. No recent fever chills. Patient states he does have anxiety OCD and is feeling anxious currently. Past Medical History  Diagnosis Date  . Lens disease   . Brain bleed   . Cataract     bilateral repair with lens implants  . Depression   . Anxiety   . OCD (obsessive compulsive disorder)   . Neuromuscular disorder   . Homocystinuria   . Arthritis   . Hypertension   . Stroke    Past Surgical History  Procedure Laterality Date  . 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 Wrist surgery Right   . Dermabrasion of face      due to acne scars  . Eye surgery      LASER + SURG BIL   . Pars plana vitrectomy Right 09/17/2013    Procedure: PARS PLANA VITRECTOMY WITH 25G REMOVAL/SUTURE SECONDARY INTRAOCULAR LENS;  Surgeon: Hayden Pedro, MD;  Location: [place];  Service: Ophthalmology;  Laterality: Right;  . Photocoagulation Right 09/17/2013    Procedure: PHOTOCOAGULATION;  Surgeon: Hayden Pedro, MD;  Location: [place];  Service: Ophthalmology;  Laterality: Right;  HEADSCOPE LASER  . Gas/fluid exchange Right  09/17/2013    Procedure: GAS/FLUID EXCHANGE;  Surgeon: Hayden Pedro, MD;  Location: [place];  Service: Ophthalmology;  Laterality: Right;   No family history on file. History  Substance Use Topics  . Smoking status: Never Smoker   . Smokeless tobacco: Not on file  . Alcohol Use: No    Review of Systems  Constitutional: Negative for fever and chills.  Eyes: Positive for visual disturbance. Negative for photophobia and pain.  Respiratory: Negative for shortness of breath.   Cardiovascular: Negative for chest pain.  Gastrointestinal: Negative for nausea, vomiting and abdominal pain.  Musculoskeletal: Negative for back pain, neck pain and neck stiffness.  Skin: Negative for rash and wound.  Neurological: Positive for dizziness, light-headedness and numbness. Negative for syncope, speech difficulty, weakness and headaches.  Psychiatric/Behavioral: The patient is nervous/anxious.   All other systems reviewed and are negative.     Allergies  Pineapple and Amoxicillin  Home Medications   Prior to Admission medications   Medication Sig Start Date End Date Taking? Authorizing Provider  ALPRAZolam Duanne Moron) 0.5 MG tablet Take 0.5 mg by mouth 3 (three) times daily.    Historical Provider, MD  amLODipine (NORVASC) 5 MG tablet Take 1 tablet (5 mg total) by mouth 2 (two) times daily. 01/02/14   Chelle Janalee Dane, PA-C  aspirin EC 81 MG tablet Take 81 mg by mouth daily.    Historical Provider, MD  atorvastatin (LIPITOR) 10 MG tablet Take 10 mg by mouth daily.    Historical Provider, MD  Betaine (CYSTADANE) POWD Take 7 scoop by mouth 3 (three) times daily. Mix with 4-6 ounces of water For Homocysteneria    Historical Provider, MD  cycloSPORINE (RESTASIS) 0.05 % ophthalmic emulsion Place 1 drop into both eyes 2 (two) times daily.    Historical Provider, MD  FLUoxetine (PROZAC) 20 MG capsule Take 20 mg by mouth daily.    Historical Provider, MD  fluvoxaMINE (LUVOX) 100 MG tablet Take 100 mg by  mouth daily.    Historical Provider, MD  folic acid (FOLVITE) 269 MCG tablet Take 1,200 mcg by mouth daily.    Historical Provider, MD  gabapentin (NEURONTIN) 100 MG capsule Take 100 mg by mouth 3 (three) times daily.    Historical Provider, MD  losartan-hydrochlorothiazide (HYZAAR) 100-12.5 MG per tablet Take 1 tablet by mouth daily. 01/02/14   Chelle S Jeffery, PA-C  pyridoxine (B-6) 100 MG tablet Take 100 mg by mouth daily.    Historical Provider, MD  risperiDONE (RISPERDAL) 2 MG tablet Take 2 mg by mouth at bedtime.    Historical Provider, MD  traZODone (DESYREL) 100 MG tablet Take 200 mg by mouth at bedtime.    Historical Provider, MD  vitamin B-12 (CYANOCOBALAMIN) 1000 MCG tablet Take 1,000 mcg by mouth daily.    Historical Provider, MD   BP 146/96  Pulse 135  Temp(Src) 97.7 F (36.5 C) (Oral)  Resp 18  SpO2 95% Physical Exam  Nursing note and vitals reviewed. Constitutional: He is oriented to person, place, and time. He appears well-developed and well-nourished. No distress.  Mildly anxious appearing  HENT:  Head: Normocephalic and atraumatic.  Mouth/Throat: Oropharynx is clear and moist.  Flattened right nasolabial fold. Otherwise cranial nerves II through XII grossly intact  Eyes: EOM are normal. Pupils are equal, round, and reactive to light.  No nystagmus noted. Congruent eye movement. Monocular double vision reported.   Neck: Normal range of motion. Neck supple.  No meningismus  Cardiovascular: Regular rhythm.   Tachycardia  Pulmonary/Chest: Effort normal and breath sounds normal. No respiratory distress. He has no wheezes. He has no rales. He exhibits no tenderness.  Abdominal: Soft. Bowel sounds are normal. He exhibits no distension and no mass. There is no tenderness. There is no rebound and no guarding.  Musculoskeletal: Normal range of motion. He exhibits no edema and no tenderness.  No calf swelling or tenderness  Neurological: He is alert and oriented to person,  place, and time.  Patient is alert and oriented x3 with clear, goal oriented speech. Patient has 5/5 motor in all extremities. Decreased sensation to light touch of the right upper extremity. Difficulty with bilateral finger to nose testing. No definite dysmetria  Skin: Skin is warm and dry. No rash noted. No erythema.  Psychiatric: His behavior is normal.  Anxious    ED Course  Procedures (including critical care time) Labs Review Labs Reviewed  I-STAT CHEM 8, ED - Abnormal; Notable for the following:    Potassium 3.6 (*)    Glucose, Bld 119 (*)    All other components within normal limits  PROTIME-INR  APTT  CBC  DIFFERENTIAL  ETHANOL  COMPREHENSIVE METABOLIC PANEL  URINE RAPID DRUG SCREEN (HOSP PERFORMED)  URINALYSIS, ROUTINE W REFLEX MICROSCOPIC  I-STAT TROPOININ, ED  I-STAT TROPOININ, ED    Imaging Review Ct Head Wo Contrast  08/14/2014   CLINICAL DATA:  Code stroke, right arm weakness  EXAM: CT HEAD WITHOUT CONTRAST  TECHNIQUE: Contiguous axial images were obtained from the base of the skull through the vertex without intravenous contrast.  COMPARISON:  CT head dated 03/23/2005  FINDINGS: No evidence of parenchymal hemorrhage or extra-axial fluid collection. No mass lesion, mass effect, or midline shift.  No CT evidence of acute infarction.  Cerebral volume is within normal limits.  No ventriculomegaly.  The visualized paranasal sinuses are essentially clear. The mastoid air cells are unopacified.  No evidence of calvarial fracture.  IMPRESSION: Normal head CT.  These results were called by telephone at the time of interpretation on 08/14/2014 at 11:22 pm to [facility], who verbally acknowledged these results.   Electronically Signed   By: Julian Hy M.D.   On: 08/14/2014 23:26     EKG Interpretation None      Date: 08/14/2014  Rate: 136  Rhythm: sinus tachycardia  QRS Axis: normal  Intervals: normal  ST/T Wave abnormalities: nonspecific T wave changes   Conduction Disutrbances:none  Narrative Interpretation:   Old EKG Reviewed: none available  CRITICAL CARE Performed by: Lita Mains, Dorla Guizar Total critical care time: 20 min Critical care time was exclusive of separately billable procedures and treating other patients. Critical care was necessary to treat or prevent imminent or life-threatening deterioration. Critical care was time spent personally by me on the following activities: development of treatment plan with patient and/or surrogate as well as nursing, discussions with consultants, evaluation of patient's response to treatment, examination of patient, obtaining history from patient or surrogate, ordering and performing treatments and interventions, ordering and review of laboratory studies, ordering and review of radiographic studies, pulse oximetry and re-evaluation of patient's condition.  MDM   Final diagnoses:  None    Dr. Doy Mince at bedside to evaluate patient after code stroke called. Patient history of intracranial bleed patient is a candidate for TPA. She suggests a CTA of the patient's neck and brain and medicine admit.  Discussed with [facility] and we will admit to telemetry bed. Heart rate did improve when the patient is alone. It rises as I enter the room. Some portion of the tachycardia may be related to anxiety.  Julianne Rice, MD 08/15/14 [phone]

## 2014-08-15 ENCOUNTER — Inpatient Hospital Stay (HOSPITAL_COMMUNITY): Payer: Medicaid Other

## 2014-08-15 ENCOUNTER — Encounter (HOSPITAL_COMMUNITY): Payer: Self-pay | Admitting: Internal Medicine

## 2014-08-15 ENCOUNTER — Emergency Department (HOSPITAL_COMMUNITY): Payer: Medicaid Other

## 2014-08-15 DIAGNOSIS — E7210 Disorders of sulfur-bearing amino-acid metabolism, unspecified: Secondary | ICD-10-CM | POA: Diagnosis present

## 2014-08-15 DIAGNOSIS — R000 Tachycardia, unspecified: Secondary | ICD-10-CM | POA: Diagnosis present

## 2014-08-15 DIAGNOSIS — R209 Unspecified disturbances of skin sensation: Secondary | ICD-10-CM | POA: Diagnosis present

## 2014-08-15 DIAGNOSIS — I498 Other specified cardiac arrhythmias: Secondary | ICD-10-CM

## 2014-08-15 DIAGNOSIS — F329 Major depressive disorder, single episode, unspecified: Secondary | ICD-10-CM | POA: Diagnosis present

## 2014-08-15 DIAGNOSIS — Z79899 Other long term (current) drug therapy: Secondary | ICD-10-CM | POA: Diagnosis not present

## 2014-08-15 DIAGNOSIS — Z7982 Long term (current) use of aspirin: Secondary | ICD-10-CM | POA: Diagnosis not present

## 2014-08-15 DIAGNOSIS — I517 Cardiomegaly: Secondary | ICD-10-CM

## 2014-08-15 DIAGNOSIS — G609 Hereditary and idiopathic neuropathy, unspecified: Secondary | ICD-10-CM | POA: Diagnosis present

## 2014-08-15 DIAGNOSIS — I10 Essential (primary) hypertension: Secondary | ICD-10-CM

## 2014-08-15 DIAGNOSIS — F411 Generalized anxiety disorder: Secondary | ICD-10-CM | POA: Diagnosis present

## 2014-08-15 DIAGNOSIS — H532 Diplopia: Secondary | ICD-10-CM | POA: Diagnosis present

## 2014-08-15 DIAGNOSIS — F3289 Other specified depressive episodes: Secondary | ICD-10-CM | POA: Diagnosis present

## 2014-08-15 DIAGNOSIS — E785 Hyperlipidemia, unspecified: Secondary | ICD-10-CM | POA: Diagnosis present

## 2014-08-15 DIAGNOSIS — F429 Obsessive-compulsive disorder, unspecified: Secondary | ICD-10-CM | POA: Diagnosis present

## 2014-08-15 DIAGNOSIS — Z8673 Personal history of transient ischemic attack (TIA), and cerebral infarction without residual deficits: Secondary | ICD-10-CM | POA: Diagnosis not present

## 2014-08-15 LAB — CBC WITH DIFFERENTIAL/PLATELET
BASOS PCT: 0 % (ref 0–1)
Basophils Absolute: 0 10*3/uL (ref 0.0–0.1)
EOS ABS: 0 10*3/uL (ref 0.0–0.7)
EOS PCT: 0 % (ref 0–5)
HCT: 40.1 % (ref 39.0–52.0)
HEMOGLOBIN: 13.8 g/dL (ref 13.0–17.0)
Lymphocytes Relative: 21 % (ref 12–46)
Lymphs Abs: 1.5 10*3/uL (ref 0.7–4.0)
MCH: 30.8 pg (ref 26.0–34.0)
MCHC: 34.4 g/dL (ref 30.0–36.0)
MCV: 89.5 fL (ref 78.0–100.0)
MONOS PCT: 5 % (ref 3–12)
Monocytes Absolute: 0.3 10*3/uL (ref 0.1–1.0)
NEUTROS PCT: 74 % (ref 43–77)
Neutro Abs: 5.1 10*3/uL (ref 1.7–7.7)
PLATELETS: 218 10*3/uL (ref 150–400)
RBC: 4.48 MIL/uL (ref 4.22–5.81)
RDW: 13.2 % (ref 11.5–15.5)
WBC: 7 10*3/uL (ref 4.0–10.5)

## 2014-08-15 LAB — LIPID PANEL
Cholesterol: 138 mg/dL (ref 0–200)
HDL: 43 mg/dL (ref >39)
LDL CALC: 71 mg/dL (ref 0–99)
Total CHOL/HDL Ratio: 3.2 RATIO
Triglycerides: 121 mg/dL (ref <150)
VLDL: 24 mg/dL (ref 0–40)

## 2014-08-15 LAB — COMPREHENSIVE METABOLIC PANEL
ALK PHOS: 57 U/L (ref 39–117)
ALT: 50 U/L (ref 0–53)
AST: 31 U/L (ref 0–37)
Albumin: 4.1 g/dL (ref 3.5–5.2)
Anion gap: 13 (ref 5–15)
BUN: 10 mg/dL (ref 6–23)
CALCIUM: 9.2 mg/dL (ref 8.4–10.5)
CO2: 26 mEq/L (ref 19–32)
Chloride: 96 mEq/L (ref 96–112)
Creatinine, Ser: 0.99 mg/dL (ref 0.50–1.35)
GFR calc Af Amer: >90 mL/min (ref >90)
Glucose, Bld: 137 mg/dL — ABNORMAL HIGH (ref 70–99)
Potassium: 4.3 mEq/L (ref 3.7–5.3)
SODIUM: 135 meq/L — AB (ref 137–147)
Total Bilirubin: 0.4 mg/dL (ref 0.3–1.2)
Total Protein: 7.8 g/dL (ref 6.0–8.3)

## 2014-08-15 LAB — URINALYSIS, ROUTINE W REFLEX MICROSCOPIC
BILIRUBIN URINE: NEGATIVE
Glucose, UA: NEGATIVE mg/dL
KETONES UR: NEGATIVE mg/dL
Leukocytes, UA: NEGATIVE
Nitrite: NEGATIVE
Protein, ur: NEGATIVE mg/dL
Specific Gravity, Urine: 1.016 (ref 1.005–1.030)
Urobilinogen, UA: 0.2 mg/dL (ref 0.0–1.0)
pH: 7 (ref 5.0–8.0)

## 2014-08-15 LAB — T3, FREE: T3 FREE: 3.5 pg/mL (ref 2.3–4.2)

## 2014-08-15 LAB — RAPID URINE DRUG SCREEN, HOSP PERFORMED
Amphetamines: NOT DETECTED
Barbiturates: NOT DETECTED
Benzodiazepines: NOT DETECTED
COCAINE: NOT DETECTED
Opiates: NOT DETECTED
TETRAHYDROCANNABINOL: NOT DETECTED

## 2014-08-15 LAB — URINE MICROSCOPIC-ADD ON

## 2014-08-15 LAB — HEMOGLOBIN A1C
Hgb A1c MFr Bld: 5.5 % (ref <5.7)
Mean Plasma Glucose: 111 mg/dL (ref <117)

## 2014-08-15 LAB — D-DIMER, QUANTITATIVE: D-Dimer, Quant: 0.28 ug/mL-FEU (ref 0.00–0.48)

## 2014-08-15 LAB — TSH: TSH: 1.1 u[IU]/mL (ref 0.350–4.500)

## 2014-08-15 LAB — T4, FREE: Free T4: 1.04 ng/dL (ref 0.80–1.80)

## 2014-08-15 MED ORDER — CYSTADANE PO POWD: 7.0000 | Freq: Three times a day (TID) | ORAL | Status: DC

## 2014-08-15 MED ORDER — SODIUM CHLORIDE 0.9 % IV SOLN
INTRAVENOUS | Status: AC
  Administered 2014-08-15: 03:00:00 via INTRAVENOUS

## 2014-08-15 MED ORDER — CYCLOSPORINE 0.05 % OP EMUL
1.0000 [drp] | Freq: Two times a day (BID) | OPHTHALMIC | Status: DC
  Administered 2014-08-15 – 2014-08-16 (×3): 1 [drp] via OPHTHALMIC
  Filled 2014-08-15 (×5): qty 1

## 2014-08-15 MED ORDER — ALPRAZOLAM 0.5 MG PO TABS
0.5000 mg | ORAL_TABLET | Freq: Three times a day (TID) | ORAL | Status: DC
  Administered 2014-08-15 – 2014-08-16 (×4): 0.5 mg via ORAL
  Filled 2014-08-15 (×4): qty 1

## 2014-08-15 MED ORDER — ASPIRIN 325 MG PO TABS
325.0000 mg | ORAL_TABLET | Freq: Every day | ORAL | Status: DC
  Administered 2014-08-15 – 2014-08-16 (×2): 325 mg via ORAL
  Filled 2014-08-15 (×2): qty 1

## 2014-08-15 MED ORDER — METOPROLOL TARTRATE 25 MG PO TABS
25.0000 mg | ORAL_TABLET | Freq: Two times a day (BID) | ORAL | Status: DC
  Administered 2014-08-15 – 2014-08-16 (×2): 25 mg via ORAL
  Filled 2014-08-15 (×3): qty 1

## 2014-08-15 MED ORDER — INFLUENZA VAC SPLIT QUAD 0.5 ML IM SUSY
0.5000 mL | PREFILLED_SYRINGE | INTRAMUSCULAR | Status: AC
  Administered 2014-08-16: 0.5 mL via INTRAMUSCULAR
  Filled 2014-08-15: qty 0.5

## 2014-08-15 MED ORDER — SENNOSIDES-DOCUSATE SODIUM 8.6-50 MG PO TABS: 1.0000 | ORAL_TABLET | Freq: Every evening | ORAL | Status: DC | PRN

## 2014-08-15 MED ORDER — ACETAMINOPHEN 325 MG PO TABS
650.0000 mg | ORAL_TABLET | Freq: Four times a day (QID) | ORAL | Status: DC | PRN
  Administered 2014-08-15 – 2014-08-16 (×3): 650 mg via ORAL
  Filled 2014-08-15 (×3): qty 2

## 2014-08-15 MED ORDER — ASPIRIN 300 MG RE SUPP: 300.0000 mg | Freq: Every day | RECTAL | Status: DC

## 2014-08-15 MED ORDER — ATORVASTATIN CALCIUM 10 MG PO TABS
10.0000 mg | ORAL_TABLET | Freq: Every day | ORAL | Status: DC
  Administered 2014-08-15: 10 mg via ORAL
  Filled 2014-08-15: qty 1

## 2014-08-15 MED ORDER — AMLODIPINE BESYLATE 5 MG PO TABS
5.0000 mg | ORAL_TABLET | Freq: Two times a day (BID) | ORAL | Status: DC
  Administered 2014-08-15 – 2014-08-16 (×4): 5 mg via ORAL
  Filled 2014-08-15 (×4): qty 1

## 2014-08-15 MED ORDER — OXYCODONE HCL 5 MG PO TABS
5.0000 mg | ORAL_TABLET | Freq: Four times a day (QID) | ORAL | Status: DC | PRN
  Administered 2014-08-15 – 2014-08-16 (×3): 5 mg via ORAL
  Filled 2014-08-15 (×3): qty 1

## 2014-08-15 MED ORDER — ARIPIPRAZOLE 5 MG PO TABS
5.0000 mg | ORAL_TABLET | Freq: Every day | ORAL | Status: DC
  Administered 2014-08-15 – 2014-08-16 (×2): 5 mg via ORAL
  Filled 2014-08-15 (×4): qty 1

## 2014-08-15 MED ORDER — STROKE: EARLY STAGES OF RECOVERY BOOK
Freq: Once | Status: DC
  Filled 2014-08-15: qty 1

## 2014-08-15 MED ORDER — TRAZODONE HCL 100 MG PO TABS
200.0000 mg | ORAL_TABLET | Freq: Every day | ORAL | Status: DC
  Administered 2014-08-15: 200 mg via ORAL
  Filled 2014-08-15: qty 2

## 2014-08-15 MED ORDER — CLOMIPRAMINE HCL 25 MG PO CAPS
225.0000 mg | ORAL_CAPSULE | Freq: Every day | ORAL | Status: DC
  Administered 2014-08-15 – 2014-08-16 (×2): 225 mg via ORAL
  Filled 2014-08-15 (×2): qty 9

## 2014-08-15 MED ORDER — FOLIC ACID 1 MG PO TABS
1000.0000 ug | ORAL_TABLET | Freq: Every day | ORAL | Status: DC
  Administered 2014-08-15 – 2014-08-16 (×2): 1 mg via ORAL
  Filled 2014-08-15 (×2): qty 1

## 2014-08-15 MED ORDER — VITAMIN B-6 100 MG PO TABS
100.0000 mg | ORAL_TABLET | Freq: Every day | ORAL | Status: DC
Start: 2014-08-15 — End: 2014-08-16
  Administered 2014-08-15 – 2014-08-16 (×2): 100 mg via ORAL
  Filled 2014-08-15 (×2): qty 1

## 2014-08-15 MED ORDER — VITAMIN B-12 1000 MCG PO TABS
1000.0000 ug | ORAL_TABLET | Freq: Every day | ORAL | Status: DC
  Administered 2014-08-15 – 2014-08-16 (×2): 1000 ug via ORAL
  Filled 2014-08-15 (×2): qty 1

## 2014-08-15 MED ORDER — ENOXAPARIN SODIUM 40 MG/0.4ML ~~LOC~~ SOLN
40.0000 mg | SUBCUTANEOUS | Status: DC
  Administered 2014-08-15 – 2014-08-16 (×2): 40 mg via SUBCUTANEOUS
  Filled 2014-08-15 (×2): qty 0.4

## 2014-08-15 MED ORDER — IOHEXOL 350 MG/ML SOLN: 50.0000 mL | Freq: Once | INTRAVENOUS | Status: DC | PRN

## 2014-08-15 MED ORDER — GABAPENTIN 300 MG PO CAPS
900.0000 mg | ORAL_CAPSULE | Freq: Three times a day (TID) | ORAL | Status: DC
  Administered 2014-08-15 – 2014-08-16 (×5): 900 mg via ORAL
  Filled 2014-08-15: qty 3
  Filled 2014-08-15: qty 9
  Filled 2014-08-15 (×3): qty 3

## 2014-08-15 MED ORDER — LOSARTAN POTASSIUM 50 MG PO TABS
100.0000 mg | ORAL_TABLET | Freq: Every day | ORAL | Status: DC
  Administered 2014-08-15 – 2014-08-16 (×2): 100 mg via ORAL
  Filled 2014-08-15 (×2): qty 2

## 2014-08-15 MED ORDER — SODIUM CHLORIDE 0.9 % IV SOLN: INTRAVENOUS | Status: DC

## 2014-08-15 NOTE — ED Notes (Addendum)
Patient transported to CT 

## 2014-08-15 NOTE — Evaluation (Signed)
Physical Therapy Evaluation Patient Details Name: Jesus Crosby. MRN: [id number] DOB: [birth year] Today's Date: 08/15/2014   History of Present Illness  Admitted with acute symptoms including diplopia, R arm numbness , facial numbness and feeling like speech is heavy and slurred.  MRI shows no acute findings.  All symptoms persist.  Clinical Impression  Pt admitted with/for s/s of stroke, but MRI neg for any acute findings.  But s/s persist as does dizziness.  Pt also shows poor visual coordination.  Pt currently limited functionally due to the problems listed below.  (see problems list.)  Pt will benefit from PT to maximize function and safety to be able to get home safely with available assist of family .     Follow Up Recommendations No PT follow up;Supervision - Intermittent    Equipment Recommendations  None recommended by PT    Recommendations for Other Services       Precautions / Restrictions Precautions Precautions: Fall Precaution Comments: feeling dizzy through out time mobilizing up on his feet      Mobility  Bed Mobility Overal bed mobility: Needs Assistance Bed Mobility: Supine to Sit;Sit to Supine     Supine to sit: Supervision Sit to supine: Supervision   General bed mobility comments: Supervision for safety only  Transfers                    Ambulation/Gait Ambulation/Gait assistance: Supervision Ambulation Distance (Feet): 300 Feet Assistive device: None Gait Pattern/deviations: Step-through pattern Gait velocity: moderate speed Gait velocity interpretation: Below normal speed for age/gender General Gait Details: steady gait, but guarded due to dizziness/lightheadedness.  Stairs Stairs: Yes   Stair Management: One rail Right;Alternating pattern;Forwards Number of Stairs: 6 General stair comments: steady, but guarded  Wheelchair Mobility    Modified Rankin (Stroke Patients Only)       Balance Overall balance assessment: Needs  assistance Sitting-balance support: No upper extremity supported Sitting balance-Leahy Scale: Normal Sitting balance - Comments: accepted maximal challenge   Standing balance support: No upper extremity supported Standing balance-Leahy Scale: Good                               Pertinent Vitals/Pain Pain Assessment: No/denies pain    Home Living Family/patient expects to be discharged to:: Private residence Living Arrangements: Alone Available Help at Discharge: Family;Available PRN/intermittently Type of Home: Apartment Home Access: Stairs to enter Entrance Stairs-Rails: Psychiatric nurse of Steps: flights Home Layout: Other (Comment) (3rd floor apartment) Home Equipment: None      Prior Function Level of Independence: Independent               Hand Dominance        Extremity/Trunk Assessment   Upper Extremity Assessment: RUE deficits/detail RUE Deficits / Details: generally weak, especially in triceps/gross extension         Lower Extremity Assessment: Overall WFL for tasks assessed (generally good movement quality)         Communication   Communication: No difficulties  Cognition Arousal/Alertness: Awake/alert Behavior During Therapy: Anxious Overall Cognitive Status: Within Functional Limits for tasks assessed                      General Comments General comments (skin integrity, edema, etc.): visual assessment showed uncoordinated saccades and pursuits.  VOR positive for nystagmus    Exercises        Assessment/Plan  PT Assessment Patient needs continued PT services  PT Diagnosis Generalized weakness;Other (comment) (poor visual coordination)   PT Problem List Decreased strength;Decreased activity tolerance;Decreased mobility;Other (comment) (dizziness/poor visual coordination)  PT Treatment Interventions Gait training;Therapeutic activities;Patient/family education   PT Goals (Current goals can be  found in the Care Plan section) Acute Rehab PT Goals Patient Stated Goal: need to understand what causing these symptoms PT Goal Formulation: With patient Time For Goal Achievement: 08/22/14 Potential to Achieve Goals: Fair    Frequency Min 2X/week   Barriers to discharge        Co-evaluation               End of Session   Activity Tolerance: Patient tolerated treatment well;Other (comment) (limited by dizziness) Patient left: in bed;with call bell/phone within reach;with family/visitor present Nurse Communication: Mobility status         Time: 6761-9509 PT Time Calculation (min): 25 min   Charges:   PT Evaluation $Initial PT Evaluation Tier I: 1 Procedure PT Treatments $Gait Training: 8-22 mins   PT G Codes:          Savaya Hakes, Tessie Fass 08/15/2014, 3:24 PM 08/15/2014  Donnella Sham, PT [phone]  (pager)

## 2014-08-15 NOTE — Progress Notes (Signed)
[facility] PROGRESS NOTE  Jesus Crosby. ZOX:[id number] DOB: January 09, 1966 DOA: 08/14/2014  PCP: Dr. Leretha Pol at [facility]  Brief HPI: 48 yo with PMH as below presented with slurred speech, diplopia and right arm numbness and weakness. Also had dizziness. He was admitted for further work up.  Past medical history:  Past Medical History  Diagnosis Date  . Lens disease   . Brain bleed   . Cataract     bilateral repair with lens implants  . Depression   . Anxiety   . OCD (obsessive compulsive disorder)   . Neuromuscular disorder   . Homocystinuria   . Arthritis   . Hypertension   . Stroke     Consultants: Neurology  Procedures:  2D ECHO and carotid dopplers pending.  Antibiotics: None   Subjective: Patient states his weakness in right arm and numbness persists. Continues to have double vision.   Objective: Vital Signs  Filed Vitals:   08/15/14 0300 08/15/14 0540 08/15/14 0800 08/15/14 1000  BP:  122/70 149/85 145/82  Pulse:  85 129 125  Temp:  97.7 F (36.5 C) 98.4 F (36.9 C)   TempSrc:  Oral Oral   Resp:  20 18 18   Height: 6\' 3"  (1.905 m)     Weight: 133.358 kg (294 lb)     SpO2:  95% 94% 94%    Intake/Output Summary (Last 24 hours) at 08/15/14 1148 Last data filed at 08/15/14 0550  Gross per 24 hour  Intake      0 ml  Output   1050 ml  Net  -1050 ml   Filed Weights   08/15/14 0300  Weight: 133.358 kg (294 lb)    General appearance: alert, cooperative, appears stated age and no distress Resp: clear to auscultation bilaterally Cardio: regular rate and rhythm, S1, S2 normal, no murmur, click, rub or gallop GI: soft, non-tender; bowel sounds normal; no masses,  no organomegaly Extremities: extremities normal, atraumatic, no cyanosis or edema Neurologic: Alert and oriented x 3. No obvious facial droop. Strength was equal upper and LE. Gait not assessed.  Lab Results:  Basic Metabolic Panel:  Recent Labs Lab 08/14/14 2311  08/14/14 2320  NA 137 137  K 4.1 3.6*  CL 96 99  CO2 25  --   GLUCOSE 116* 119*  BUN 11 10  CREATININE 1.02 1.20  CALCIUM 9.3  --    Liver Function Tests:  Recent Labs Lab 08/14/14 2311  AST 52*  ALT 57*  ALKPHOS 59  BILITOT 0.3  PROT 8.2  ALBUMIN 4.1   CBC:  Recent Labs Lab 08/14/14 2311 08/14/14 2320  WBC 8.6  --   NEUTROABS 3.8  --   HGB 14.4 15.3  HCT 40.7 45.0  MCV 89.5  --   PLT 209  --      Studies/Results: Ct Angio Head W/cm &/or Wo Cm  08/15/2014   CLINICAL DATA:  Headache and dizziness, 2 days of diplopia. Prior code stroke.  EXAM: CT ANGIOGRAPHY HEAD AND NECK  TECHNIQUE: Multidetector CT imaging of the head and neck was performed using the standard protocol during bolus administration of intravenous contrast. Multiplanar CT image reconstructions and MIPs were obtained to evaluate the vascular anatomy. Carotid stenosis measurements (when applicable) are obtained utilizing NASCET criteria, using the distal internal carotid diameter as the denominator.  CONTRAST:  50 cc Omnipaque 350  COMPARISON:  CT of the head August 14, 2014  FINDINGS: CT HEAD FINDINGS  Ventricles are normal  in size. Mild prominence of the sulci in referring cerebral volume loss. No intraparenchymal hemorrhage, mass effect nor midline shift. No acute large vascular territory infarcts.  No abnormal extra-axial fluid collections. Basal cisterns are patent. Mild calcific atherosclerosis of the carotid siphons.  No skull fracture. The included ocular globes and orbital contents are non-suspicious. The mastoid aircells and included paranasal sinuses are well-aerated.  CTA HEAD FINDINGS  Anterior circulation: Normal appearance of the cervical internal carotid arteries, petrous, cavernous and supra clinoid internal carotid arteries. Widely patent anterior communicating artery. Normal appearance of the anterior and middle cerebral arteries.  Posterior circulation: Left vertebral artery is dominant with  normal appearance of the vertebral arteries, vertebrobasilar junction and basilar artery, as well as main branch vessels. Normal appearance of the posterior cerebral arteries. Bilateral posterior communicating arteries present.  No large vessel occlusion, hemodynamically significant stenosis, dissection, luminal irregularity, contrast extravasation or aneurysm within the anterior nor posterior circulation.  Review of the MIP images confirms the above findings.  CTA NECK FINDINGS  Normal appearance of the thoracic arch, normal branch pattern. The origins of the innominate, left Common carotid artery and subclavian artery are widely patent.  Bilateral Common carotid arteries are widely patent, coursing in a straight line fashion. Medial course of the left Common carotid artery, normal variant and may be transient. Normal appearance of the carotid bifurcations without hemodynamically significant stenosis by NASCET criteria. 22 mm eccentric calcific atherosclerosis of the left carotid bulb. Normal appearance of the included internal carotid arteries. Medial left internal carotid artery course.  Left vertebral artery is dominant. Normal appearance of the vertebral arteries, which appear widely patent.  No hemodynamically significant stenosis by NASCET criteria. No dissection, no pseudoaneurysm. No abnormal luminal irregularity. No contrast extravasation.  Soft tissues are unremarkable. No acute osseous process though bone windows have not been submitted. Straightened cervical lordosis with severe disc degeneration.  Review of the MIP images confirms the above findings.  IMPRESSION: CT head: No acute intracranial process. Mild parenchymal brain volume loss for age.  CTA head: No acute vascular process or hemodynamically significant stenosis. Complete circle of Willis.  CTA neck: No acute vascular process or hemodynamically significant stenosis.   Electronically Signed   By: Elon Alas   On: 08/15/2014 00:37   Dg  Chest 2 View  08/15/2014   CLINICAL DATA:  Weakness; recent CVA  EXAM: CHEST  2 VIEW  COMPARISON:  September 16, 2013  FINDINGS: There is no edema or consolidation. The heart size and pulmonary vascularity are normal. No adenopathy. No bone lesions.  IMPRESSION: No edema or consolidation.   Electronically Signed   By: Lowella Grip M.D.   On: 08/15/2014 07:49   Ct Head Wo Contrast  08/14/2014   CLINICAL DATA:  Code stroke, right arm weakness  EXAM: CT HEAD WITHOUT CONTRAST  TECHNIQUE: Contiguous axial images were obtained from the base of the skull through the vertex without intravenous contrast.  COMPARISON:  CT head dated 03/23/2005  FINDINGS: No evidence of parenchymal hemorrhage or extra-axial fluid collection. No mass lesion, mass effect, or midline shift.  No CT evidence of acute infarction.  Cerebral volume is within normal limits.  No ventriculomegaly.  The visualized paranasal sinuses are essentially clear. The mastoid air cells are unopacified.  No evidence of calvarial fracture.  IMPRESSION: Normal head CT.  These results were called by telephone at the time of interpretation on 08/14/2014 at 11:22 pm to [facility], who verbally acknowledged these results.  Electronically Signed   By: Julian Hy M.D.   On: 08/14/2014 23:26   Ct Angio Neck W/cm &/or Wo/cm  08/15/2014   CLINICAL DATA:  Headache and dizziness, 2 days of diplopia. Prior code stroke.  EXAM: CT ANGIOGRAPHY HEAD AND NECK  TECHNIQUE: Multidetector CT imaging of the head and neck was performed using the standard protocol during bolus administration of intravenous contrast. Multiplanar CT image reconstructions and MIPs were obtained to evaluate the vascular anatomy. Carotid stenosis measurements (when applicable) are obtained utilizing NASCET criteria, using the distal internal carotid diameter as the denominator.  CONTRAST:  50 cc Omnipaque 350  COMPARISON:  CT of the head August 14, 2014  FINDINGS: CT HEAD FINDINGS   Ventricles are normal in size. Mild prominence of the sulci in referring cerebral volume loss. No intraparenchymal hemorrhage, mass effect nor midline shift. No acute large vascular territory infarcts.  No abnormal extra-axial fluid collections. Basal cisterns are patent. Mild calcific atherosclerosis of the carotid siphons.  No skull fracture. The included ocular globes and orbital contents are non-suspicious. The mastoid aircells and included paranasal sinuses are well-aerated.  CTA HEAD FINDINGS  Anterior circulation: Normal appearance of the cervical internal carotid arteries, petrous, cavernous and supra clinoid internal carotid arteries. Widely patent anterior communicating artery. Normal appearance of the anterior and middle cerebral arteries.  Posterior circulation: Left vertebral artery is dominant with normal appearance of the vertebral arteries, vertebrobasilar junction and basilar artery, as well as main branch vessels. Normal appearance of the posterior cerebral arteries. Bilateral posterior communicating arteries present.  No large vessel occlusion, hemodynamically significant stenosis, dissection, luminal irregularity, contrast extravasation or aneurysm within the anterior nor posterior circulation.  Review of the MIP images confirms the above findings.  CTA NECK FINDINGS  Normal appearance of the thoracic arch, normal branch pattern. The origins of the innominate, left Common carotid artery and subclavian artery are widely patent.  Bilateral Common carotid arteries are widely patent, coursing in a straight line fashion. Medial course of the left Common carotid artery, normal variant and may be transient. Normal appearance of the carotid bifurcations without hemodynamically significant stenosis by NASCET criteria. 22 mm eccentric calcific atherosclerosis of the left carotid bulb. Normal appearance of the included internal carotid arteries. Medial left internal carotid artery course.  Left vertebral  artery is dominant. Normal appearance of the vertebral arteries, which appear widely patent.  No hemodynamically significant stenosis by NASCET criteria. No dissection, no pseudoaneurysm. No abnormal luminal irregularity. No contrast extravasation.  Soft tissues are unremarkable. No acute osseous process though bone windows have not been submitted. Straightened cervical lordosis with severe disc degeneration.  Review of the MIP images confirms the above findings.  IMPRESSION: CT head: No acute intracranial process. Mild parenchymal brain volume loss for age.  CTA head: No acute vascular process or hemodynamically significant stenosis. Complete circle of Willis.  CTA neck: No acute vascular process or hemodynamically significant stenosis.   Electronically Signed   By: Elon Alas   On: 08/15/2014 00:37   Mr Brain Wo Contrast  08/15/2014   CLINICAL DATA:  Intermittent dizziness for 1 week. Diplopia. Right arm numbness with some facial numbness. Slurred speech. Evaluate for stroke.  EXAM: MRI HEAD WITHOUT CONTRAST  TECHNIQUE: Multiplanar, multiecho pulse sequences of the brain and surrounding structures were obtained without intravenous contrast.  COMPARISON:  Head CTA 08/15/2014. Head CT 08/14/2014. Brain MRI 03/17/2005.  FINDINGS: Incidental note is again made of an empty sella. There is no evidence of acute  infarct, intracranial hemorrhage, mass, midline shift, or extra-axial fluid collection. Mild generalized cerebral atrophy is again seen. No significant white matter disease is seen.  Prior bilateral ocular lens replacements are noted. There is mild dilatation of the optic nerve sheath anteriorly. Paranasal sinuses are clear. There may be trace right mastoid fluid. Major intracranial vascular flow voids are preserved.  IMPRESSION: 1. No acute infarct or mass. 2. Persistent empty sella with mild dilatation of optic nerve sheaths. These are nonspecific findings but can be seen in the setting of increased  intracranial pressure/intracranial hypertension.   Electronically Signed   By: Logan Bores   On: 08/15/2014 08:15    Medications:  Scheduled: .  stroke: mapping our early stages of recovery book   Does not apply Once  . ALPRAZolam  0.5 mg Oral TID  . amLODipine  5 mg Oral BID  . ARIPiprazole  5 mg Oral QHS  . aspirin  300 mg Rectal Daily   Or  . aspirin  325 mg Oral Daily  . atorvastatin  10 mg Oral QHS  . clomiPRAMINE  225 mg Oral Daily  . cycloSPORINE  1 drop Both Eyes BID  . CYSTADANE  7 scoop Oral TID  . enoxaparin (LOVENOX) injection  40 mg Subcutaneous Q24H  . folic acid  2,355 mcg Oral Daily  . gabapentin  900 mg Oral TID  . [START ON 08/16/2014] Influenza vac split quadrivalent PF  0.5 mL Intramuscular Tomorrow-1000  . losartan  100 mg Oral Daily  . pyridoxine  100 mg Oral Daily  . traZODone  200 mg Oral QHS  . vitamin B-12  1,000 mcg Oral Daily   Continuous: . sodium chloride 50 mL/hr at 08/15/14 0253   DDU:[id number], oxyCODONE, senna-docusate  Assessment/Plan:  Principal Problem:   Diplopia Active Problems:   HOMOCYSTINURIA   HYPERLIPIDEMIA   HTN (hypertension)   Sinus tachycardia    Diplopia with right-sided numbness and other non specific symptoms Stroke work up in progress. MRI was negative for stroke however. Await neurology input. ECHo and carotid doppler pending. Patient also had a head CT angiogram of the head and neck which has been unremarkable. Continue Aspirin for now. Further recommendations per neurologist. PT and OT.   Sinus tachycardia Patient states he has been having tachycardia for long-term. TSH pending. D-dimer normal . Follow up on 2D ECHO.   Essential Hypertension Continue present medications except HCTZ as patient is receiving gentle hydration.  Hyperlipidemia On statins.  Homocystinuria Continue home medications.   History of peripheral Neuropathy Related to medications per patient.  DVT Prophylaxis: Lovenox    Code  Status: Full Code  Family Communication: Discussed with patient  Disposition Plan: Work up in progress. Will likely return home when ready.    LOS: 1 day   [facility] Pager [phone] 08/15/2014, 11:48 AM  If 8PM-8AM, please contact night-coverage at [email or web address], password [email or web address]   Disclaimer: This note was dictated with voice recognition software. Similar sounding words can inadvertently be transcribed and may not be corrected upon review.

## 2014-08-15 NOTE — ED Notes (Signed)
Pt transported to CT by this RN.

## 2014-08-15 NOTE — Progress Notes (Signed)
VASCULAR LAB PRELIMINARY  PRELIMINARY  PRELIMINARY  PRELIMINARY  Carotid duplex  completed.    Preliminary report:  Bilateral:  1-39% ICA stenosis.  Vertebral artery flow is antegrade.      Payden Bonus, RVT 08/15/2014, 1:05 PM

## 2014-08-15 NOTE — Progress Notes (Signed)
  Echocardiogram 2D Echocardiogram has been performed.  Jamieson Lisa 08/15/2014, 11:27 AM

## 2014-08-15 NOTE — Progress Notes (Signed)
Stroke Team Progress Note  HISTORY Jesus Crosby. is an 48 y.o. male who reports that he has had intermittent episodes of dizziness for the past 7-8 days. Today while watching television noted that he right arm was numb. He began to experience double vision as well. Felt his speech was thicker than at baseline. When symptoms did not resolve patient presented for evaluation. He reports that he had a stroke in 1997 that affected his right side. He has no residual from that stroke. Was on Coumadin in the past but this was discontinued.   Patient was not administerd TPA secondary to being outside the window. He was admitted to [facility] for further evaluation and treatment.  SUBJECTIVE He is resting comfortably. Reports continued bilateral monocular diplopia, continued periods of feeling like he is going to pass out and continued paresthesias in his RUE. Had unremarkable brain MRI. Of note, he states that his stroke in 1997 was a dural sinus thrombosis and he has a history of hypercoagulability. Denies any current headache.   OBJECTIVE Most recent Vital Signs: Filed Vitals:   08/15/14 1400 08/15/14 1444 08/15/14 1448 08/15/14 1450  BP: 134/72 138/87 122/97 131/84  Pulse: 122 130 135 144  Temp: 98.6 F (37 C) 98.9 F (37.2 C)    TempSrc: Oral Oral    Resp: 20 20    Height:      Weight:      SpO2: 92% 94%     CBG (last 3)  No results found for this basename: GLUCAP,  in the last 72 hours  IV Fluid Intake:   . sodium chloride 50 mL/hr at 08/15/14 0253    MEDICATIONS  .  stroke: mapping our early stages of recovery book   Does not apply Once  . ALPRAZolam  0.5 mg Oral TID  . amLODipine  5 mg Oral BID  . ARIPiprazole  5 mg Oral QHS  . aspirin  300 mg Rectal Daily   Or  . aspirin  325 mg Oral Daily  . atorvastatin  10 mg Oral QHS  . clomiPRAMINE  225 mg Oral Daily  . cycloSPORINE  1 drop Both Eyes BID  . CYSTADANE  7 scoop Oral TID  . enoxaparin (LOVENOX) injection  40 mg Subcutaneous Q24H   . folic acid  2,585 mcg Oral Daily  . gabapentin  900 mg Oral TID  . [START ON 08/16/2014] Influenza vac split quadrivalent PF  0.5 mL Intramuscular Tomorrow-1000  . losartan  100 mg Oral Daily  . metoprolol tartrate  25 mg Oral BID  . pyridoxine  100 mg Oral Daily  . traZODone  200 mg Oral QHS  . vitamin B-12  1,000 mcg Oral Daily   PRN:  acetaminophen, oxyCODONE, senna-docusate   CLINICALLY SIGNIFICANT STUDIES Basic Metabolic Panel:  Recent Labs Lab 08/14/14 2311 08/14/14 2320 08/15/14 1226  NA 137 137 135*  K 4.1 3.6* 4.3  CL 96 99 96  CO2 25  --  26  GLUCOSE 116* 119* 137*  BUN $Re'11 10 10  'OLY$ CREATININE 1.02 1.20 0.99  CALCIUM 9.3  --  9.2   Liver Function Tests:  Recent Labs Lab 08/14/14 2311 08/15/14 1226  AST 52* 31  ALT 57* 50  ALKPHOS 59 57  BILITOT 0.3 0.4  PROT 8.2 7.8  ALBUMIN 4.1 4.1   CBC:  Recent Labs Lab 08/14/14 2311 08/14/14 2320 08/15/14 1226  WBC 8.6  --  7.0  NEUTROABS 3.8  --  5.1  HGB  14.4 15.3 13.8  HCT 40.7 45.0 40.1  MCV 89.5  --  89.5  PLT 209  --  218   Coagulation:  Recent Labs Lab 08/14/14 2311  LABPROT 14.5  INR 1.13   Cardiac Enzymes: No results found for this basename: CKTOTAL, CKMB, CKMBINDEX, TROPONINI,  in the last 168 hours Urinalysis:  Recent Labs Lab 08/15/14 0233  COLORURINE YELLOW  LABSPEC 1.016  PHURINE 7.0  GLUCOSEU NEGATIVE  HGBUR TRACE*  BILIRUBINUR NEGATIVE  KETONESUR NEGATIVE  PROTEINUR NEGATIVE  UROBILINOGEN 0.2  NITRITE NEGATIVE  LEUKOCYTESUR NEGATIVE   Lipid Panel    Component Value Date/Time   CHOL 138 08/15/2014 1226   TRIG 121 08/15/2014 1226   HDL 43 08/15/2014 1226   CHOLHDL 3.2 08/15/2014 1226   VLDL 24 08/15/2014 1226   LDLCALC 71 08/15/2014 1226   HgbA1C  No results found for this basename: HGBA1C    Urine Drug Screen:     Component Value Date/Time   LABOPIA NONE DETECTED 08/15/2014 0230   COCAINSCRNUR NONE DETECTED 08/15/2014 0230   LABBENZ NONE DETECTED 08/15/2014 0230    AMPHETMU NONE DETECTED 08/15/2014 0230   THCU NONE DETECTED 08/15/2014 0230   LABBARB NONE DETECTED 08/15/2014 0230    Alcohol Level:  Recent Labs Lab 08/14/14 2311  ETH <11    Ct Angio Head W/cm &/or Wo Cm  08/15/2014   CLINICAL DATA:  Headache and dizziness, 2 days of diplopia. Prior code stroke.  EXAM: CT ANGIOGRAPHY HEAD AND NECK  TECHNIQUE: Multidetector CT imaging of the head and neck was performed using the standard protocol during bolus administration of intravenous contrast. Multiplanar CT image reconstructions and MIPs were obtained to evaluate the vascular anatomy. Carotid stenosis measurements (when applicable) are obtained utilizing NASCET criteria, using the distal internal carotid diameter as the denominator.  CONTRAST:  50 cc Omnipaque 350  COMPARISON:  CT of the head August 14, 2014  FINDINGS: CT HEAD FINDINGS  Ventricles are normal in size. Mild prominence of the sulci in referring cerebral volume loss. No intraparenchymal hemorrhage, mass effect nor midline shift. No acute large vascular territory infarcts.  No abnormal extra-axial fluid collections. Basal cisterns are patent. Mild calcific atherosclerosis of the carotid siphons.  No skull fracture. The included ocular globes and orbital contents are non-suspicious. The mastoid aircells and included paranasal sinuses are well-aerated.  CTA HEAD FINDINGS  Anterior circulation: Normal appearance of the cervical internal carotid arteries, petrous, cavernous and supra clinoid internal carotid arteries. Widely patent anterior communicating artery. Normal appearance of the anterior and middle cerebral arteries.  Posterior circulation: Left vertebral artery is dominant with normal appearance of the vertebral arteries, vertebrobasilar junction and basilar artery, as well as main branch vessels. Normal appearance of the posterior cerebral arteries. Bilateral posterior communicating arteries present.  No large vessel occlusion, hemodynamically  significant stenosis, dissection, luminal irregularity, contrast extravasation or aneurysm within the anterior nor posterior circulation.  Review of the MIP images confirms the above findings.  CTA NECK FINDINGS  Normal appearance of the thoracic arch, normal branch pattern. The origins of the innominate, left Common carotid artery and subclavian artery are widely patent.  Bilateral Common carotid arteries are widely patent, coursing in a straight line fashion. Medial course of the left Common carotid artery, normal variant and may be transient. Normal appearance of the carotid bifurcations without hemodynamically significant stenosis by NASCET criteria. 22 mm eccentric calcific atherosclerosis of the left carotid bulb. Normal appearance of the included internal carotid arteries. Medial left  internal carotid artery course.  Left vertebral artery is dominant. Normal appearance of the vertebral arteries, which appear widely patent.  No hemodynamically significant stenosis by NASCET criteria. No dissection, no pseudoaneurysm. No abnormal luminal irregularity. No contrast extravasation.  Soft tissues are unremarkable. No acute osseous process though bone windows have not been submitted. Straightened cervical lordosis with severe disc degeneration.  Review of the MIP images confirms the above findings.  IMPRESSION: CT head: No acute intracranial process. Mild parenchymal brain volume loss for age.  CTA head: No acute vascular process or hemodynamically significant stenosis. Complete circle of Willis.  CTA neck: No acute vascular process or hemodynamically significant stenosis.   Electronically Signed   By: Elon Alas   On: 08/15/2014 00:37   Dg Chest 2 View  08/15/2014   CLINICAL DATA:  Weakness; recent CVA  EXAM: CHEST  2 VIEW  COMPARISON:  September 16, 2013  FINDINGS: There is no edema or consolidation. The heart size and pulmonary vascularity are normal. No adenopathy. No bone lesions.  IMPRESSION: No edema  or consolidation.   Electronically Signed   By: Lowella Grip M.D.   On: 08/15/2014 07:49   Ct Head Wo Contrast  08/14/2014   CLINICAL DATA:  Code stroke, right arm weakness  EXAM: CT HEAD WITHOUT CONTRAST  TECHNIQUE: Contiguous axial images were obtained from the base of the skull through the vertex without intravenous contrast.  COMPARISON:  CT head dated 03/23/2005  FINDINGS: No evidence of parenchymal hemorrhage or extra-axial fluid collection. No mass lesion, mass effect, or midline shift.  No CT evidence of acute infarction.  Cerebral volume is within normal limits.  No ventriculomegaly.  The visualized paranasal sinuses are essentially clear. The mastoid air cells are unopacified.  No evidence of calvarial fracture.  IMPRESSION: Normal head CT.  These results were called by telephone at the time of interpretation on 08/14/2014 at 11:22 pm to [facility], who verbally acknowledged these results.   Electronically Signed   By: Julian Hy M.D.   On: 08/14/2014 23:26   Ct Angio Neck W/cm &/or Wo/cm  08/15/2014   CLINICAL DATA:  Headache and dizziness, 2 days of diplopia. Prior code stroke.  EXAM: CT ANGIOGRAPHY HEAD AND NECK  TECHNIQUE: Multidetector CT imaging of the head and neck was performed using the standard protocol during bolus administration of intravenous contrast. Multiplanar CT image reconstructions and MIPs were obtained to evaluate the vascular anatomy. Carotid stenosis measurements (when applicable) are obtained utilizing NASCET criteria, using the distal internal carotid diameter as the denominator.  CONTRAST:  50 cc Omnipaque 350  COMPARISON:  CT of the head August 14, 2014  FINDINGS: CT HEAD FINDINGS  Ventricles are normal in size. Mild prominence of the sulci in referring cerebral volume loss. No intraparenchymal hemorrhage, mass effect nor midline shift. No acute large vascular territory infarcts.  No abnormal extra-axial fluid collections. Basal cisterns are patent. Mild  calcific atherosclerosis of the carotid siphons.  No skull fracture. The included ocular globes and orbital contents are non-suspicious. The mastoid aircells and included paranasal sinuses are well-aerated.  CTA HEAD FINDINGS  Anterior circulation: Normal appearance of the cervical internal carotid arteries, petrous, cavernous and supra clinoid internal carotid arteries. Widely patent anterior communicating artery. Normal appearance of the anterior and middle cerebral arteries.  Posterior circulation: Left vertebral artery is dominant with normal appearance of the vertebral arteries, vertebrobasilar junction and basilar artery, as well as main branch vessels. Normal appearance of the posterior cerebral arteries.  Bilateral posterior communicating arteries present.  No large vessel occlusion, hemodynamically significant stenosis, dissection, luminal irregularity, contrast extravasation or aneurysm within the anterior nor posterior circulation.  Review of the MIP images confirms the above findings.  CTA NECK FINDINGS  Normal appearance of the thoracic arch, normal branch pattern. The origins of the innominate, left Common carotid artery and subclavian artery are widely patent.  Bilateral Common carotid arteries are widely patent, coursing in a straight line fashion. Medial course of the left Common carotid artery, normal variant and may be transient. Normal appearance of the carotid bifurcations without hemodynamically significant stenosis by NASCET criteria. 22 mm eccentric calcific atherosclerosis of the left carotid bulb. Normal appearance of the included internal carotid arteries. Medial left internal carotid artery course.  Left vertebral artery is dominant. Normal appearance of the vertebral arteries, which appear widely patent.  No hemodynamically significant stenosis by NASCET criteria. No dissection, no pseudoaneurysm. No abnormal luminal irregularity. No contrast extravasation.  Soft tissues are unremarkable.  No acute osseous process though bone windows have not been submitted. Straightened cervical lordosis with severe disc degeneration.  Review of the MIP images confirms the above findings.  IMPRESSION: CT head: No acute intracranial process. Mild parenchymal brain volume loss for age.  CTA head: No acute vascular process or hemodynamically significant stenosis. Complete circle of Willis.  CTA neck: No acute vascular process or hemodynamically significant stenosis.   Electronically Signed   By: Elon Alas   On: 08/15/2014 00:37   Mr Brain Wo Contrast  08/15/2014   CLINICAL DATA:  Intermittent dizziness for 1 week. Diplopia. Right arm numbness with some facial numbness. Slurred speech. Evaluate for stroke.  EXAM: MRI HEAD WITHOUT CONTRAST  TECHNIQUE: Multiplanar, multiecho pulse sequences of the brain and surrounding structures were obtained without intravenous contrast.  COMPARISON:  Head CTA 08/15/2014. Head CT 08/14/2014. Brain MRI 03/17/2005.  FINDINGS: Incidental note is again made of an empty sella. There is no evidence of acute infarct, intracranial hemorrhage, mass, midline shift, or extra-axial fluid collection. Mild generalized cerebral atrophy is again seen. No significant white matter disease is seen.  Prior bilateral ocular lens replacements are noted. There is mild dilatation of the optic nerve sheath anteriorly. Paranasal sinuses are clear. There may be trace right mastoid fluid. Major intracranial vascular flow voids are preserved.  IMPRESSION: 1. No acute infarct or mass. 2. Persistent empty sella with mild dilatation of optic nerve sheaths. These are nonspecific findings but can be seen in the setting of increased intracranial pressure/intracranial hypertension.   Electronically Signed   By: Logan Bores   On: 08/15/2014 08:15    CT of the brain    MRI of the brain    MRA of the brain    2D Echocardiogram    Carotid Doppler    CXR    Physical Exam   Mental Status:  Alert,  oriented, thought content appropriate. Speech fluent without evidence of aphasia. Mild dysarthria. Able to follow 3 step commands without difficulty.  Cranial Nerves:  II: Visual fields grossly normal, Left pupil irregular. Right pupils round. Both reactive to light  III,IV, VI: ptosis not present, extra-ocular motions intact bilaterally. Patient reports bilateral monocular diplopia.  V,VII: decrease in right NLF, facial light touch sensation decreased on the left.  VIII: hearing normal bilaterally  IX,X: gag reflex present  XI: bilateral shoulder shrug  XII: midline tongue extension  Motor:  Right : Upper extremity 5/5 Left: Upper extremity 5/5  Lower extremity 5/5 Lower  extremity 5/5  Tone and bulk:normal tone throughout; no atrophy noted  Sensory: Pinprick and light touch decreased on the RUE  Deep Tendon Reflexes: 2+ and symmetric throughout  Plantars:  Right: downgoing Left: downgoing  Cerebellar:  normal finger-to-nose and normal heel-to-shin testing  Gait: unable to test   ASSESSMENT Mr. Jesus Crosby. is a 48 y.o. male presenting with diplopia, dizziness and RUE paresthesias. tPA not given as outside the window. MRI negative for acute stroke but symptoms persist. Unclear etiology. Based on history of DST and hypercoagulable state this needs to be ruled out. Would also consider an autoimmune disorder. Presentation would be atypical for IIH/pseudotumor cerebri but based on MRI findings this would also be in the differential.   [facility] day # 1  TREATMENT/PLAN  Check MRV to rule out dural sinus thrombosis  Would consider checking ANA, ESR, CRP. This can be followed up as an outpatient  Suggest follow up with ophthalmology for bilateral monocular diplopia. Can be done as outpatient  Pending ophthalmology evaluation would consider LP for question of IIH though based on history of no headache and no papilledema this is less likely.    I have  personally obtained a history, examined the patient, evaluated imaging results, and formulated the assessment and plan of care. I agree with the above.  Jim Like, DO [facility] Pager: [phone]   To contact Stroke Continuity provider, please refer to [email or web address] After hours, contact General Neurology

## 2014-08-15 NOTE — ED Notes (Signed)
MD at bedside. 

## 2014-08-15 NOTE — Code Documentation (Signed)
Pt c/o dizzy spells 2-3 times a day for one week. LSW at 1600 watching TV when he noted numbness to his right UE, double vision and felt his speech was thick.  He drove himself to the ED with arrival at 2246.  He was seen by Dr Lita Mains at [phone] with a subsequent code stroke called at 2317.  Pt arrived in CT at 2315,  Dr Doy Mince arrived at 2321 with RRT arriving at 2327.  NIHSS baseline 4 with points scored for missing age, minor facial palsy, partial sensory loss and mild dysarthria.    Pt states he had a prior CVA in 1997 with no residual deficits.   CT head negative for bleed,  CTA with no acute vascular process.  To be admitted by hospitalists.  97.6  146/96  122  18 95% RA

## 2014-08-15 NOTE — H&P (Signed)
[facility] History and Physical  Jesus Crosby. YTK:[id number] DOB: 1966-02-18 DOA: 08/14/2014  Referring physician: ER physician. PCP: No PCP Per Patient  Chief Complaint: Diplopia and right arm numbness.  HPI: Jesus Crosby. is a 48 y.o. male with history of previous stroke and homocystinuria, hypertension and hyperlipidemia has been experiencing dizziness off and on for last one week. Patient last ate around 4 PM while watching television started experiencing diplopia. He should also had right arm numbness with some facial numbness. Patient felt his speech was thick and slurred. Since the symptoms did not improve patient came ER. Patient was evaluated by on-call urologist Dr. Doy Mince. CT head for which CT angiogram of the head and neck were negative for anything acute. Patient has been admitted for further management for possible stroke. Patient denies any chest pain but has been having some exertional shortness of breath off and on. Denies blurred vision difficulty swallowing. Has been able to move all extremities. Patient is found to be in sinus tachycardia.   Review of Systems: As presented in the history of presenting illness, rest negative.  Past Medical History  Diagnosis Date  . Lens disease   . Brain bleed   . Cataract     bilateral repair with lens implants  . Depression   . Anxiety   . OCD (obsessive compulsive disorder)   . Neuromuscular disorder   . Homocystinuria   . Arthritis   . Hypertension   . Stroke    Past Surgical History  Procedure Laterality Date  . 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 Wrist surgery Right   . Dermabrasion of face      due to acne scars  . Eye surgery      LASER + SURG BIL   . Pars plana vitrectomy Right 09/17/2013    Procedure: PARS PLANA VITRECTOMY WITH 25G REMOVAL/SUTURE SECONDARY INTRAOCULAR LENS;  Surgeon: Hayden Pedro, MD;  Location: [facility];  Service: Ophthalmology;   Laterality: Right;  . Photocoagulation Right 09/17/2013    Procedure: PHOTOCOAGULATION;  Surgeon: Hayden Pedro, MD;  Location: [place];  Service: Ophthalmology;  Laterality: Right;  HEADSCOPE LASER  . Gas/fluid exchange Right 09/17/2013    Procedure: GAS/FLUID EXCHANGE;  Surgeon: Hayden Pedro, MD;  Location: [place];  Service: Ophthalmology;  Laterality: Right;   Social History:  reports that he has never smoked. He does not have any smokeless tobacco history on file. He reports that he does not drink alcohol or use illicit drugs. Where does patient live home. Can patient participate in ADLs? Yes.  Allergies  Allergen Reactions  . Pineapple Swelling    Lips swelling  . Amoxicillin Nausea And Vomiting    REACTION: Nausea and vomiting    Family History:  Family History  Problem Relation Age of Onset  . Breast cancer Mother   . Stroke Other       Prior to Admission medications   Medication Sig Start Date End Date Taking? Authorizing Provider  ALPRAZolam Duanne Moron) 0.5 MG tablet Take 0.5 mg by mouth 3 (three) times daily.   Yes Historical Provider, MD  amLODipine (NORVASC) 5 MG tablet Take 1 tablet (5 mg total) by mouth 2 (two) times daily. 01/02/14  Yes Chelle S Jeffery, PA-C  ARIPiprazole (ABILIFY) 5 MG tablet Take 5 mg by mouth at bedtime.   Yes Historical Provider, MD  aspirin EC 81 MG tablet Take 81 mg by mouth  daily.   Yes Historical Provider, MD  atorvastatin (LIPITOR) 10 MG tablet Take 10 mg by mouth at bedtime.    Yes Historical Provider, MD  Betaine (CYSTADANE) POWD Take 7 scoop by mouth 3 (three) times daily. Mix with 4-6 ounces of water For Homocysteneria   Yes Historical Provider, MD  clomiPRAMINE (ANAFRANIL) 75 MG capsule Take 225 mg by mouth daily.   Yes Historical Provider, MD  cycloSPORINE (RESTASIS) 0.05 % ophthalmic emulsion Place 1 drop into both eyes 2 (two) times daily.   Yes Historical Provider, MD  folic acid (FOLVITE) 856 MCG tablet Take 1,200 mcg by mouth  daily.   Yes Historical Provider, MD  gabapentin (NEURONTIN) 300 MG capsule Take 900 mg by mouth 3 (three) times daily.   Yes Historical Provider, MD  losartan-hydrochlorothiazide (HYZAAR) 100-12.5 MG per tablet Take 1 tablet by mouth daily. 01/02/14  Yes Chelle S Jeffery, PA-C  pyridoxine (B-6) 100 MG tablet Take 100 mg by mouth daily.   Yes Historical Provider, MD  vitamin B-12 (CYANOCOBALAMIN) 1000 MCG tablet Take 1,000 mcg by mouth daily.   Yes Historical Provider, MD  traZODone (DESYREL) 100 MG tablet Take 200 mg by mouth at bedtime.    Historical Provider, MD    Physical Exam: Filed Vitals:   08/15/14 0030 08/15/14 0048 08/15/14 0100 08/15/14 0200  BP: 117/71  129/88 139/88  Pulse: 116   117  Temp:  97.6 F (36.4 C)  98.1 F (36.7 C)  TempSrc:    Oral  Resp: 16  16 20   SpO2: 96%  96% 96%     General:  Well-developed well-nourished.  Eyes: Anicteric no pallor.  ENT: No discharge from ears eyes nose mouth.  Neck: No mass felt.  Cardiovascular: S1-S2 heard tachycardic.  Respiratory: No rhonchi or crepitations.  Abdomen: Soft nontender bowel sounds present. No guarding or rigidity.  Skin: No rash.  Musculoskeletal: No edema.  Psychiatric: Appears normal.  Neurologic: Alert awake oriented to time place and person. Moves all extremities 5 x 5. Mild right-sided facial asymmetry. No tongue deviation. PERRLA positive.  Labs on Admission:  Basic Metabolic Panel:  Recent Labs Lab 08/14/14 2311 08/14/14 2320  NA 137 137  K 4.1 3.6*  CL 96 99  CO2 25  --   GLUCOSE 116* 119*  BUN 11 10  CREATININE 1.02 1.20  CALCIUM 9.3  --    Liver Function Tests:  Recent Labs Lab 08/14/14 2311  AST 52*  ALT 57*  ALKPHOS 59  BILITOT 0.3  PROT 8.2  ALBUMIN 4.1   No results found for this basename: LIPASE, AMYLASE,  in the last 168 hours No results found for this basename: AMMONIA,  in the last 168 hours CBC:  Recent Labs Lab 08/14/14 2311 08/14/14 2320  WBC 8.6   --   NEUTROABS 3.8  --   HGB 14.4 15.3  HCT 40.7 45.0  MCV 89.5  --   PLT 209  --    Cardiac Enzymes: No results found for this basename: CKTOTAL, CKMB, CKMBINDEX, TROPONINI,  in the last 168 hours  BNP (last 3 results) No results found for this basename: PROBNP,  in the last 8760 hours CBG: No results found for this basename: GLUCAP,  in the last 168 hours  Radiological Exams on Admission: Ct Angio Head W/cm &/or Wo Cm  08/15/2014   CLINICAL DATA:  Headache and dizziness, 2 days of diplopia. Prior code stroke.  EXAM: CT ANGIOGRAPHY HEAD AND NECK  TECHNIQUE: Multidetector  CT imaging of the head and neck was performed using the standard protocol during bolus administration of intravenous contrast. Multiplanar CT image reconstructions and MIPs were obtained to evaluate the vascular anatomy. Carotid stenosis measurements (when applicable) are obtained utilizing NASCET criteria, using the distal internal carotid diameter as the denominator.  CONTRAST:  50 cc Omnipaque 350  COMPARISON:  CT of the head August 14, 2014  FINDINGS: CT HEAD FINDINGS  Ventricles are normal in size. Mild prominence of the sulci in referring cerebral volume loss. No intraparenchymal hemorrhage, mass effect nor midline shift. No acute large vascular territory infarcts.  No abnormal extra-axial fluid collections. Basal cisterns are patent. Mild calcific atherosclerosis of the carotid siphons.  No skull fracture. The included ocular globes and orbital contents are non-suspicious. The mastoid aircells and included paranasal sinuses are well-aerated.  CTA HEAD FINDINGS  Anterior circulation: Normal appearance of the cervical internal carotid arteries, petrous, cavernous and supra clinoid internal carotid arteries. Widely patent anterior communicating artery. Normal appearance of the anterior and middle cerebral arteries.  Posterior circulation: Left vertebral artery is dominant with normal appearance of the vertebral arteries,  vertebrobasilar junction and basilar artery, as well as main branch vessels. Normal appearance of the posterior cerebral arteries. Bilateral posterior communicating arteries present.  No large vessel occlusion, hemodynamically significant stenosis, dissection, luminal irregularity, contrast extravasation or aneurysm within the anterior nor posterior circulation.  Review of the MIP images confirms the above findings.  CTA NECK FINDINGS  Normal appearance of the thoracic arch, normal branch pattern. The origins of the innominate, left Common carotid artery and subclavian artery are widely patent.  Bilateral Common carotid arteries are widely patent, coursing in a straight line fashion. Medial course of the left Common carotid artery, normal variant and may be transient. Normal appearance of the carotid bifurcations without hemodynamically significant stenosis by NASCET criteria. 22 mm eccentric calcific atherosclerosis of the left carotid bulb. Normal appearance of the included internal carotid arteries. Medial left internal carotid artery course.  Left vertebral artery is dominant. Normal appearance of the vertebral arteries, which appear widely patent.  No hemodynamically significant stenosis by NASCET criteria. No dissection, no pseudoaneurysm. No abnormal luminal irregularity. No contrast extravasation.  Soft tissues are unremarkable. No acute osseous process though bone windows have not been submitted. Straightened cervical lordosis with severe disc degeneration.  Review of the MIP images confirms the above findings.  IMPRESSION: CT head: No acute intracranial process. Mild parenchymal brain volume loss for age.  CTA head: No acute vascular process or hemodynamically significant stenosis. Complete circle of Willis.  CTA neck: No acute vascular process or hemodynamically significant stenosis.   Electronically Signed   By: Elon Alas   On: 08/15/2014 00:37   Ct Head Wo Contrast  08/14/2014   CLINICAL  DATA:  Code stroke, right arm weakness  EXAM: CT HEAD WITHOUT CONTRAST  TECHNIQUE: Contiguous axial images were obtained from the base of the skull through the vertex without intravenous contrast.  COMPARISON:  CT head dated 03/23/2005  FINDINGS: No evidence of parenchymal hemorrhage or extra-axial fluid collection. No mass lesion, mass effect, or midline shift.  No CT evidence of acute infarction.  Cerebral volume is within normal limits.  No ventriculomegaly.  The visualized paranasal sinuses are essentially clear. The mastoid air cells are unopacified.  No evidence of calvarial fracture.  IMPRESSION: Normal head CT.  These results were called by telephone at the time of interpretation on 08/14/2014 at 11:22 pm to [facility], who  verbally acknowledged these results.   Electronically Signed   By: Julian Hy M.D.   On: 08/14/2014 23:26   Ct Angio Neck W/cm &/or Wo/cm  08/15/2014   CLINICAL DATA:  Headache and dizziness, 2 days of diplopia. Prior code stroke.  EXAM: CT ANGIOGRAPHY HEAD AND NECK  TECHNIQUE: Multidetector CT imaging of the head and neck was performed using the standard protocol during bolus administration of intravenous contrast. Multiplanar CT image reconstructions and MIPs were obtained to evaluate the vascular anatomy. Carotid stenosis measurements (when applicable) are obtained utilizing NASCET criteria, using the distal internal carotid diameter as the denominator.  CONTRAST:  50 cc Omnipaque 350  COMPARISON:  CT of the head August 14, 2014  FINDINGS: CT HEAD FINDINGS  Ventricles are normal in size. Mild prominence of the sulci in referring cerebral volume loss. No intraparenchymal hemorrhage, mass effect nor midline shift. No acute large vascular territory infarcts.  No abnormal extra-axial fluid collections. Basal cisterns are patent. Mild calcific atherosclerosis of the carotid siphons.  No skull fracture. The included ocular globes and orbital contents are non-suspicious. The  mastoid aircells and included paranasal sinuses are well-aerated.  CTA HEAD FINDINGS  Anterior circulation: Normal appearance of the cervical internal carotid arteries, petrous, cavernous and supra clinoid internal carotid arteries. Widely patent anterior communicating artery. Normal appearance of the anterior and middle cerebral arteries.  Posterior circulation: Left vertebral artery is dominant with normal appearance of the vertebral arteries, vertebrobasilar junction and basilar artery, as well as main branch vessels. Normal appearance of the posterior cerebral arteries. Bilateral posterior communicating arteries present.  No large vessel occlusion, hemodynamically significant stenosis, dissection, luminal irregularity, contrast extravasation or aneurysm within the anterior nor posterior circulation.  Review of the MIP images confirms the above findings.  CTA NECK FINDINGS  Normal appearance of the thoracic arch, normal branch pattern. The origins of the innominate, left Common carotid artery and subclavian artery are widely patent.  Bilateral Common carotid arteries are widely patent, coursing in a straight line fashion. Medial course of the left Common carotid artery, normal variant and may be transient. Normal appearance of the carotid bifurcations without hemodynamically significant stenosis by NASCET criteria. 22 mm eccentric calcific atherosclerosis of the left carotid bulb. Normal appearance of the included internal carotid arteries. Medial left internal carotid artery course.  Left vertebral artery is dominant. Normal appearance of the vertebral arteries, which appear widely patent.  No hemodynamically significant stenosis by NASCET criteria. No dissection, no pseudoaneurysm. No abnormal luminal irregularity. No contrast extravasation.  Soft tissues are unremarkable. No acute osseous process though bone windows have not been submitted. Straightened cervical lordosis with severe disc degeneration.  Review  of the MIP images confirms the above findings.  IMPRESSION: CT head: No acute intracranial process. Mild parenchymal brain volume loss for age.  CTA head: No acute vascular process or hemodynamically significant stenosis. Complete circle of Willis.  CTA neck: No acute vascular process or hemodynamically significant stenosis.   Electronically Signed   By: Elon Alas   On: 08/15/2014 00:37    EKG: Independently reviewed. Sinus tachycardia.  Assessment/Plan Principal Problem:   Diplopia Active Problems:   HOMOCYSTINURIA   HYPERLIPIDEMIA   HTN (hypertension)   Sinus tachycardia   1. Diplopia with right-sided numbness - concerning for CVA. At this time patient has been placed on neurochecks and swallow evaluation. MRI brain and 2-D echo has been ordered. Patient has had a head CT angiogram of the head and neck which has been  unremarkable. Aspirin. Further recommendations per neurologist. Physical therapy consult. 2. Sinus tachycardia - patient states he has been having tachycardia for long-term. Check thyroid function tests to rule out hyperthyroidism and check d-dimer since patient gets short of breath off and on. Check 2-D echo. Closely monitor in telemetry. 3. Hypertension - continue present medications except HCTZ as patient is receiving gentle hydration. 4. Hyperlipidemia - on statins. 5. Homocystinuria - continue supplements.    Code Status: Full code.  Family Communication: None.  Disposition Plan: Admit to inpatient.    Arnisha Laffoon N. [facility] Pager [phone].  If 7PM-7AM, please contact night-coverage [email or web address] Password [email or web address] 08/15/2014, 2:09 AM

## 2014-08-16 LAB — COMPREHENSIVE METABOLIC PANEL
ALK PHOS: 38 U/L — AB (ref 39–117)
ALT: 51 U/L (ref 0–53)
ANION GAP: 10 (ref 5–15)
AST: 93 U/L — ABNORMAL HIGH (ref 0–37)
Albumin: 3.5 g/dL (ref 3.5–5.2)
BILIRUBIN TOTAL: 0.5 mg/dL (ref 0.3–1.2)
BUN: 13 mg/dL (ref 6–23)
CHLORIDE: 98 meq/L (ref 96–112)
CO2: 26 meq/L (ref 19–32)
Calcium: 8.7 mg/dL (ref 8.4–10.5)
Creatinine, Ser: 1.04 mg/dL (ref 0.50–1.35)
GFR, EST NON AFRICAN AMERICAN: 84 mL/min — AB (ref >90)
Glucose, Bld: 85 mg/dL (ref 70–99)
POTASSIUM: 5.4 meq/L — AB (ref 3.7–5.3)
SODIUM: 134 meq/L — AB (ref 137–147)
TOTAL PROTEIN: 7.4 g/dL (ref 6.0–8.3)

## 2014-08-16 LAB — CBC
HCT: 39 % (ref 39.0–52.0)
Hemoglobin: 13.1 g/dL (ref 13.0–17.0)
MCH: 30.6 pg (ref 26.0–34.0)
MCHC: 33.6 g/dL (ref 30.0–36.0)
MCV: 91.1 fL (ref 78.0–100.0)
Platelets: 181 10*3/uL (ref 150–400)
RBC: 4.28 MIL/uL (ref 4.22–5.81)
RDW: 13.5 % (ref 11.5–15.5)
WBC: 6.4 10*3/uL (ref 4.0–10.5)

## 2014-08-16 LAB — BASIC METABOLIC PANEL
Anion gap: 10 (ref 5–15)
BUN: 13 mg/dL (ref 6–23)
CALCIUM: 8.7 mg/dL (ref 8.4–10.5)
CHLORIDE: 97 meq/L (ref 96–112)
CO2: 23 mEq/L (ref 19–32)
CREATININE: 1.08 mg/dL (ref 0.50–1.35)
GFR calc non Af Amer: 80 mL/min — ABNORMAL LOW (ref >90)
Glucose, Bld: 116 mg/dL — ABNORMAL HIGH (ref 70–99)
Potassium: 5.8 mEq/L — ABNORMAL HIGH (ref 3.7–5.3)
Sodium: 130 mEq/L — ABNORMAL LOW (ref 137–147)

## 2014-08-16 MED ORDER — SODIUM POLYSTYRENE SULFONATE 15 GM/60ML PO SUSP
30.0000 g | Freq: Once | ORAL | Status: AC
  Administered 2014-08-16: 30 g via ORAL
  Filled 2014-08-16: qty 120

## 2014-08-16 MED ORDER — METOPROLOL TARTRATE 25 MG PO TABS: 25.0000 mg | ORAL_TABLET | Freq: Two times a day (BID) | ORAL | Status: DC

## 2014-08-16 NOTE — Progress Notes (Signed)
Discharged home with family in stable condition to self care.

## 2014-08-16 NOTE — Discharge Instructions (Signed)
PLEASE FOLLOW UP WITH AN OPHTHALMOLOGIST ASAP FOR DOUBLE VISION  Diplopia  Double vision (diplopia) means that you are seeing two of everything. Diplopia usually occurs with both eyes open, and may be worse when looking in one particular direction. If both eyes must be open to see double, this is called binocular diplopia. If double images are seen in just one eye, this is called monocular diplopia.  CAUSES  Binocular Diplopia  Disorder affecting the muscles that move the eyes or the nerves that control those muscles.  Tumor or other mass pushing on an eye from beside or behind the eye.  Myasthenia gravis. This is a neuromuscular illness that causes the body's muscles to tire easily. The eye muscles and eyelid muscles become weak. The eyes do not track together well.  Grave's disease. This is an overactivity of the thyroid gland. This condition causes swelling of tissues around the eyes. This produces a bulging out of the eyeball.  Blowout fracture of the bone around the eye. The muscles of the eye socket are damaged. This often happens when the eye is hit with force.  Complications after certain surgeries, such as a lens implant after cataract surgery.  Fluid-filled mass (abscess) behind or beside the eye, infection, or abnormal connection between arteries and veins. Sometimes, no cause is found. Monocular Diplopia  Problems with corrective lens or contacts.  Corneal abrasion, infection, severe injury, or bulging and irregularity of the corneal surface (keratoconus).  Irregularities of the pupil from drugs, severe injury, or other causes.  Problems involving the lens of the eye, such as opacities or cataracts.  Complications after certain surgeries, such as a lens implant after cataract surgery.  Retinal detachment or problems involving the blood vessels of the retina. Sometimes, no cause is found. SYMPTOMS  Binocular Diplopia  When one eye is closed or covered, the double  images disappear. Monocular Diplopia  When the unaffected eye is closed or covered, the double images remain. The double images disappear when the affected eye is closed or covered. DIAGNOSIS  A diagnosis is made during an eye exam. TREATMENT  Treatment depends on the cause or underlying disease.   Relief of double vision symptoms may be achieved by patching one eye or by using special glasses.  Surgery on the muscles of the eye may be needed. SEEK IMMEDIATE MEDICAL CARE IF:   You see two images of a single object you are looking at, either with both eyes open or with just one eye open. Document Released: 09/22/2004 Document Revised: 02/13/2012 Document Reviewed: 07/09/2008 [facility] Patient Information 2015 [place], [place]. This information is not intended to replace advice given to you by your health care provider. Make sure you discuss any questions you have with your health care provider.

## 2014-08-16 NOTE — Progress Notes (Signed)
Stroke Team Progress Note  HISTORY Jesus Schellhorn. is an 48 y.o. male who reports that he has had intermittent episodes of dizziness for the past 7-8 days. Today while watching television noted that he right arm was numb. He began to experience double vision as well. Felt his speech was thicker than at baseline. When symptoms did not resolve patient presented for evaluation. He reports that he had a stroke in 1997 that affected his right side. He has no residual from that stroke. Was on Coumadin in the past but this was discontinued.   Patient was not administerd TPA secondary to being outside the window. He was admitted to [facility] for further evaluation and treatment.  SUBJECTIVE He is resting comfortably. Reports continued bilateral monocular diplopia, and hx of previous change of lens surgery in bilateral eye, continued periods of feeling like he is going to pass out and continued intermittent paresthesias in his RUE. Had unremarkable brain MRI. 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 Denies any current headache.   OBJECTIVE Most recent Vital Signs: Filed Vitals:   08/16/14 0400 08/16/14 0600 08/16/14 0946 08/16/14 1013  BP:  106/76 116/75 116/66  Pulse:  83 104 95  Temp:  97.4 F (36.3 C) 97.6 F (36.4 C) 97 F (36.1 C)  TempSrc:  Oral Oral Oral  Resp: 20 18 18 18   Height:      Weight:      SpO2:  95% 95% 94%   CBG (last 3)  No results found for this basename: GLUCAP,  in the last 72 hours  IV Fluid Intake:     MEDICATIONS   PRN:       CLINICALLY SIGNIFICANT STUDIES Basic Metabolic Panel:   Recent Labs Lab 08/16/14 0620 08/16/14 0945  NA 134* 130*  K 5.4* 5.8*  CL 98 97  CO2 26 23  GLUCOSE 85 116*  BUN 13 13  CREATININE 1.04 1.08  CALCIUM 8.7 8.7   Liver Function Tests:   Recent Labs Lab 08/15/14 1226 08/16/14 0620  AST 31 93*  ALT 50 51  ALKPHOS 57 38*  BILITOT 0.4 0.5  PROT 7.8 7.4  ALBUMIN 4.1 3.5   CBC:  Recent  Labs Lab 08/14/14 2311  08/15/14 1226 08/16/14 0620  WBC 8.6  --  7.0 6.4  NEUTROABS 3.8  --  5.1  --   HGB 14.4  < > 13.8 13.1  HCT 40.7  < > 40.1 39.0  MCV 89.5  --  89.5 91.1  PLT 209  --  218 181  < > = values in this interval not displayed. Coagulation:   Recent Labs Lab 08/14/14 2311  LABPROT 14.5  INR 1.13   Cardiac Enzymes: No results found for this basename: CKTOTAL, CKMB, CKMBINDEX, TROPONINI,  in the last 168 hours Urinalysis:   Recent Labs Lab 08/15/14 0233  COLORURINE YELLOW  LABSPEC 1.016  PHURINE 7.0  GLUCOSEU NEGATIVE  HGBUR TRACE*  BILIRUBINUR NEGATIVE  KETONESUR NEGATIVE  PROTEINUR NEGATIVE  UROBILINOGEN 0.2  NITRITE NEGATIVE  LEUKOCYTESUR NEGATIVE   Lipid Panel    Component Value Date/Time   CHOL 138 08/15/2014 1226   TRIG 121 08/15/2014 1226   HDL 43 08/15/2014 1226   CHOLHDL 3.2 08/15/2014 1226   VLDL 24 08/15/2014 1226   LDLCALC 71 08/15/2014 1226   HgbA1C  Lab Results  Component Value Date   HGBA1C 5.5 08/15/2014    Urine Drug Screen:  Component Value Date/Time   LABOPIA NONE DETECTED 08/15/2014 0230   COCAINSCRNUR NONE DETECTED 08/15/2014 0230   LABBENZ NONE DETECTED 08/15/2014 0230   AMPHETMU NONE DETECTED 08/15/2014 0230   THCU NONE DETECTED 08/15/2014 0230   LABBARB NONE DETECTED 08/15/2014 0230    Alcohol Level:   Recent Labs Lab 08/14/14 2311  ETH <11    Ct Angio Head and neck W/cm &/or Wo Cm  08/15/2014   IMPRESSION: CT head: No acute intracranial process. Mild parenchymal brain volume loss for age.  CTA head: No acute vascular process or hemodynamically significant stenosis. Complete circle of Willis.  CTA neck: No acute vascular process or hemodynamically significant stenosis.     Dg Chest 2 View  08/15/2014   IMPRESSION: No edema or consolidation.     Mr Brain Wo Contrast  08/15/2014 IMPRESSION: 1. No acute infarct or mass. 2. Persistent empty sella with mild dilatation of optic nerve sheaths. These are nonspecific  findings but can be seen in the setting of increased intracranial pressure/intracranial hypertension.     Mr Mrv Head Wo Cm  08/16/2014   IMPRESSION: Normal MRV without evidence of venous sinus thrombosis.       2D Echocardiogram   - Left ventricle: The cavity size was normal. There was moderate focal basal hypertrophy. Systolic function was normal. The estimated ejection fraction was in the range of 60% to 65%. Images were inadequate for LV wall motion assessment. - Aorta: Aortic root dimension: 37 mm (ED). - Ascending aorta: The ascending aorta was mildly dilated. - Pulmonic valve: There was trivial regur:gitation. Impressions - patient is tachycardic making assessment of LVF and wall motion difficult. Suggest repeat study once HR controlled.  Physical Exam   Mental Status:  Alert, oriented, thought content appropriate. Speech fluent without evidence of aphasia. Mild dysarthria. Able to follow 3 step commands without difficulty.  Cranial Nerves:  II: Visual fields normal, Left pupil irregular. Right pupils round. Both reactive to light. Fundus sharp disc without venous pulsation.  III,IV, VI: ptosis not present, extra-ocular motions intact bilaterally. Patient reports bilateral monocular diplopia.  V,VII: decrease in right NLF, facial light touch sensation decreased on the left.  VIII: hearing normal bilaterally  IX,X: gag reflex present  XI: bilateral shoulder shrug  XII: midline tongue extension  Motor:  Right : Upper extremity 5/5 Left: Upper extremity 5/5  Lower extremity 5/5 Lower extremity 5/5  Tone and bulk:normal tone throughout; no atrophy noted  Sensory: Pinprick and light touch decreased on the RUE  Deep Tendon Reflexes: 2+ and symmetric throughout  Plantars:  Right: downgoing Left: downgoing  Cerebellar:  normal finger-to-nose and normal heel-to-shin testing  Gait: unable to test   ASSESSMENT Mr. Jesus Crosby. is a 48 y.o. male presenting with diplopia,  dizziness and RUE paresthesias. tPA not given as outside the window. MRI negative for acute stroke but symptoms persist. The symptoms are not consistent with stroke, more likely due to eyes themselves, such as monocular diplopia. Unclear etiology. Based on history of DST and hypercoagulable state this needs to be ruled out. Would also consider an autoimmune disorder.   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    Hospital day # 2  TREATMENT/PLAN  outpt follow up for hypercoagulable and antoimmune work up  outpt repeat homocysteine and B12 level  Continue B12 supplement  Pt has appointment with ophthalmologist next week.  Follow up in one month with Dr. Erlinda Hong in [facility]   Thank you for the consult.  Rosalin Hawking, MD PhD Stroke Neurology 08/16/2014 11:44 PM  To contact Stroke Continuity provider, please refer to [email or web address] After hours, contact General Neurology

## 2014-08-16 NOTE — Discharge Summary (Signed)
[facility]  Physician Discharge Summary   Patient ID: Jesus Crosby. MRN: [id number] DOB/AGE: 1966/10/21 48 y.o.  Admit date: 08/14/2014 Discharge date: 08/16/2014  PCP: Dr. Leretha Pol at [place]:  Principal Problem:   Diplopia Active Problems:   HOMOCYSTINURIA   HYPERLIPIDEMIA   HTN (hypertension)   Sinus tachycardia   RECOMMENDATIONS FOR OUTPATIENT FOLLOW UP: 1. Needs follow up with Ophthalmologist 2. Needs close follow up with Neurology 3. Bmet when seen by PCP. Holding Hyzaar for now. See below.   DISCHARGE CONDITION: fair  Diet recommendation: Heart Healthy  Filed Weights   08/15/14 0300  Weight: 133.358 kg (294 lb)    INITIAL HISTORY: 48 yo with PMH as below presented with slurred speech, diplopia and right arm numbness and weakness. Also had dizziness. He was admitted for further work up.   Past medical history:  Past Medical History   Diagnosis  Date   .  Lens disease    .  Brain bleed    .  Cataract      bilateral repair with lens implants   .  Depression    .  Anxiety    .  OCD (obsessive compulsive disorder)    .  Neuromuscular disorder    .  Homocystinuria    .  Arthritis    .  Hypertension    .  Stroke     Consultations:  Neurology  Procedures:  2D ECHO  Study Conclusions - Left ventricle: The cavity size was normal. There was moderate focal basal hypertrophy. Systolic function was normal. The estimated ejection fraction was in the range of 60% to 65%. Images were inadequate for LV wall motion assessment.  - Aorta: Aortic root dimension: 37 mm (ED). - Ascending aorta: The ascending aorta was mildly dilated. - Pulmonic valve: There was trivial regurgitation.  Carotid dopplers Bilateral: 1-39% ICA stenosis. Vertebral artery flow is antegrade.    HOSPITAL COURSE:   Diplopia with right-sided numbness and other non specific symptoms  MRI Brain was negative for stroke. Neurology has seen and  ordred MR Venogram which was also negative. They recommend outpatient work up for now. Patient will need to see an ophthalmologist. His diplopia is improving. No etiology found for his symptoms. ECHO and carotid doppler were unremarkable. Patient also had a head CT angiogram of the head and neck which has been unremarkable. Continue Aspirin for now. Seen by PT and OT and he does not need OP therapy.  Sinus tachycardia  Patient states he has been having tachycardia for long-term. TSH is normal. D-dimer normal . 2D ECHO did not suggest anything concerning. He was started on beta blocker yesterday and his heart rate has responded well. He will be prescribed same and will need to follow up with his PCP.  Essential Hypertension  Continue home medications.   Hyperlipidemia  Continue statins.   Homocystinuria  Continue home medications.   History of peripheral Neuropathy  Related to medications per patient.  Noted to have hyperkalemia today but it is a hemolysed sample. He is not receiving any potassium supplements. He is on a ARB but his potassium was normal yesterday. Unfortunately repeat draw was also hemolysed. To be safe we will ask him to hold his Losartan till he can follow up with his PCP when he will need repeat blood work. Will give kayexalate as well. He can then be discharged.    PERTINENT LABS: The results of significant diagnostics from this hospitalization (including imaging,  microbiology, ancillary and laboratory) are listed below for reference.     Labs: Basic Metabolic Panel:  Recent Labs Lab 08/14/14 2311 08/14/14 2320 08/15/14 1226 08/16/14 0620 08/16/14 0945  NA 137 137 135* 134* 130*  K 4.1 3.6* 4.3 5.4* 5.8*  CL 96 99 96 98 97  CO2 25  --  26 26 23   GLUCOSE 116* 119* 137* 85 116*  BUN 11 10 10 13 13   CREATININE 1.02 1.20 0.99 1.04 1.08  CALCIUM 9.3  --  9.2 8.7 8.7   Liver Function Tests:  Recent Labs Lab 08/14/14 2311 08/15/14 1226 08/16/14 0620  AST  52* 31 93*  ALT 57* 50 51  ALKPHOS 59 57 38*  BILITOT 0.3 0.4 0.5  PROT 8.2 7.8 7.4  ALBUMIN 4.1 4.1 3.5   CBC:  Recent Labs Lab 08/14/14 2311 08/14/14 2320 08/15/14 1226 08/16/14 0620  WBC 8.6  --  7.0 6.4  NEUTROABS 3.8  --  5.1  --   HGB 14.4 15.3 13.8 13.1  HCT 40.7 45.0 40.1 39.0  MCV 89.5  --  89.5 91.1  PLT 209  --  218 181    IMAGING STUDIES Ct Angio Head W/cm &/or Wo Cm  08/15/2014   CLINICAL DATA:  Headache and dizziness, 2 days of diplopia. Prior code stroke.  EXAM: CT ANGIOGRAPHY HEAD AND NECK  TECHNIQUE: Multidetector CT imaging of the head and neck was performed using the standard protocol during bolus administration of intravenous contrast. Multiplanar CT image reconstructions and MIPs were obtained to evaluate the vascular anatomy. Carotid stenosis measurements (when applicable) are obtained utilizing NASCET criteria, using the distal internal carotid diameter as the denominator.  CONTRAST:  50 cc Omnipaque 350  COMPARISON:  CT of the head August 14, 2014  FINDINGS: CT HEAD FINDINGS  Ventricles are normal in size. Mild prominence of the sulci in referring cerebral volume loss. No intraparenchymal hemorrhage, mass effect nor midline shift. No acute large vascular territory infarcts.  No abnormal extra-axial fluid collections. Basal cisterns are patent. Mild calcific atherosclerosis of the carotid siphons.  No skull fracture. The included ocular globes and orbital contents are non-suspicious. The mastoid aircells and included paranasal sinuses are well-aerated.  CTA HEAD FINDINGS  Anterior circulation: Normal appearance of the cervical internal carotid arteries, petrous, cavernous and supra clinoid internal carotid arteries. Widely patent anterior communicating artery. Normal appearance of the anterior and middle cerebral arteries.  Posterior circulation: Left vertebral artery is dominant with normal appearance of the vertebral arteries, vertebrobasilar junction and basilar  artery, as well as main branch vessels. Normal appearance of the posterior cerebral arteries. Bilateral posterior communicating arteries present.  No large vessel occlusion, hemodynamically significant stenosis, dissection, luminal irregularity, contrast extravasation or aneurysm within the anterior nor posterior circulation.  Review of the MIP images confirms the above findings.  CTA NECK FINDINGS  Normal appearance of the thoracic arch, normal branch pattern. The origins of the innominate, left Common carotid artery and subclavian artery are widely patent.  Bilateral Common carotid arteries are widely patent, coursing in a straight line fashion. Medial course of the left Common carotid artery, normal variant and may be transient. Normal appearance of the carotid bifurcations without hemodynamically significant stenosis by NASCET criteria. 22 mm eccentric calcific atherosclerosis of the left carotid bulb. Normal appearance of the included internal carotid arteries. Medial left internal carotid artery course.  Left vertebral artery is dominant. Normal appearance of the vertebral arteries, which appear widely patent.  No hemodynamically significant  stenosis by NASCET criteria. No dissection, no pseudoaneurysm. No abnormal luminal irregularity. No contrast extravasation.  Soft tissues are unremarkable. No acute osseous process though bone windows have not been submitted. Straightened cervical lordosis with severe disc degeneration.  Review of the MIP images confirms the above findings.  IMPRESSION: CT head: No acute intracranial process. Mild parenchymal brain volume loss for age.  CTA head: No acute vascular process or hemodynamically significant stenosis. Complete circle of Willis.  CTA neck: No acute vascular process or hemodynamically significant stenosis.   Electronically Signed   By: Elon Alas   On: 08/15/2014 00:37   Dg Chest 2 View  08/15/2014   CLINICAL DATA:  Weakness; recent CVA  EXAM: CHEST  2  VIEW  COMPARISON:  September 16, 2013  FINDINGS: There is no edema or consolidation. The heart size and pulmonary vascularity are normal. No adenopathy. No bone lesions.  IMPRESSION: No edema or consolidation.   Electronically Signed   By: Lowella Grip M.D.   On: 08/15/2014 07:49   Ct Head Wo Contrast  08/14/2014   CLINICAL DATA:  Code stroke, right arm weakness  EXAM: CT HEAD WITHOUT CONTRAST  TECHNIQUE: Contiguous axial images were obtained from the base of the skull through the vertex without intravenous contrast.  COMPARISON:  CT head dated 03/23/2005  FINDINGS: No evidence of parenchymal hemorrhage or extra-axial fluid collection. No mass lesion, mass effect, or midline shift.  No CT evidence of acute infarction.  Cerebral volume is within normal limits.  No ventriculomegaly.  The visualized paranasal sinuses are essentially clear. The mastoid air cells are unopacified.  No evidence of calvarial fracture.  IMPRESSION: Normal head CT.  These results were called by telephone at the time of interpretation on 08/14/2014 at 11:22 pm to [facility], who verbally acknowledged these results.   Electronically Signed   By: Julian Hy M.D.   On: 08/14/2014 23:26   Ct Angio Neck W/cm &/or Wo/cm  08/15/2014   CLINICAL DATA:  Headache and dizziness, 2 days of diplopia. Prior code stroke.  EXAM: CT ANGIOGRAPHY HEAD AND NECK  TECHNIQUE: Multidetector CT imaging of the head and neck was performed using the standard protocol during bolus administration of intravenous contrast. Multiplanar CT image reconstructions and MIPs were obtained to evaluate the vascular anatomy. Carotid stenosis measurements (when applicable) are obtained utilizing NASCET criteria, using the distal internal carotid diameter as the denominator.  CONTRAST:  50 cc Omnipaque 350  COMPARISON:  CT of the head August 14, 2014  FINDINGS: CT HEAD FINDINGS  Ventricles are normal in size. Mild prominence of the sulci in referring cerebral volume  loss. No intraparenchymal hemorrhage, mass effect nor midline shift. No acute large vascular territory infarcts.  No abnormal extra-axial fluid collections. Basal cisterns are patent. Mild calcific atherosclerosis of the carotid siphons.  No skull fracture. The included ocular globes and orbital contents are non-suspicious. The mastoid aircells and included paranasal sinuses are well-aerated.  CTA HEAD FINDINGS  Anterior circulation: Normal appearance of the cervical internal carotid arteries, petrous, cavernous and supra clinoid internal carotid arteries. Widely patent anterior communicating artery. Normal appearance of the anterior and middle cerebral arteries.  Posterior circulation: Left vertebral artery is dominant with normal appearance of the vertebral arteries, vertebrobasilar junction and basilar artery, as well as main branch vessels. Normal appearance of the posterior cerebral arteries. Bilateral posterior communicating arteries present.  No large vessel occlusion, hemodynamically significant stenosis, dissection, luminal irregularity, contrast extravasation or aneurysm within the anterior nor  posterior circulation.  Review of the MIP images confirms the above findings.  CTA NECK FINDINGS  Normal appearance of the thoracic arch, normal branch pattern. The origins of the innominate, left Common carotid artery and subclavian artery are widely patent.  Bilateral Common carotid arteries are widely patent, coursing in a straight line fashion. Medial course of the left Common carotid artery, normal variant and may be transient. Normal appearance of the carotid bifurcations without hemodynamically significant stenosis by NASCET criteria. 22 mm eccentric calcific atherosclerosis of the left carotid bulb. Normal appearance of the included internal carotid arteries. Medial left internal carotid artery course.  Left vertebral artery is dominant. Normal appearance of the vertebral arteries, which appear widely  patent.  No hemodynamically significant stenosis by NASCET criteria. No dissection, no pseudoaneurysm. No abnormal luminal irregularity. No contrast extravasation.  Soft tissues are unremarkable. No acute osseous process though bone windows have not been submitted. Straightened cervical lordosis with severe disc degeneration.  Review of the MIP images confirms the above findings.  IMPRESSION: CT head: No acute intracranial process. Mild parenchymal brain volume loss for age.  CTA head: No acute vascular process or hemodynamically significant stenosis. Complete circle of Willis.  CTA neck: No acute vascular process or hemodynamically significant stenosis.   Electronically Signed   By: Elon Alas   On: 08/15/2014 00:37   Mr Brain Wo Contrast  08/15/2014   CLINICAL DATA:  Intermittent dizziness for 1 week. Diplopia. Right arm numbness with some facial numbness. Slurred speech. Evaluate for stroke.  EXAM: MRI HEAD WITHOUT CONTRAST  TECHNIQUE: Multiplanar, multiecho pulse sequences of the brain and surrounding structures were obtained without intravenous contrast.  COMPARISON:  Head CTA 08/15/2014. Head CT 08/14/2014. Brain MRI 03/17/2005.  FINDINGS: Incidental note is again made of an empty sella. There is no evidence of acute infarct, intracranial hemorrhage, mass, midline shift, or extra-axial fluid collection. Mild generalized cerebral atrophy is again seen. No significant white matter disease is seen.  Prior bilateral ocular lens replacements are noted. There is mild dilatation of the optic nerve sheath anteriorly. Paranasal sinuses are clear. There may be trace right mastoid fluid. Major intracranial vascular flow voids are preserved.  IMPRESSION: 1. No acute infarct or mass. 2. Persistent empty sella with mild dilatation of optic nerve sheaths. These are nonspecific findings but can be seen in the setting of increased intracranial pressure/intracranial hypertension.   Electronically Signed   By: Logan Bores   On: 08/15/2014 08:15   Mr Mrv Head Wo Cm  08/16/2014   CLINICAL DATA:  Evaluate for venous sinus thrombosis.  EXAM: MR MRV HEAD WITHOUT CONTRAST  TECHNIQUE: Multiplanar, multisequence MR imaging was performed. No intravenous contrast was administered.  COMPARISON:  Prior MRI from earlier the same day.  FINDINGS: Dedicated MRVdemonstrate no filling defect to suggest venous sinus thrombosis. The superior sagittal, transverse, and sigmoid sinuses are well opacified. The deep venous structures including the vein of Galen and straight sinus are opacified.  No other acute intracranial abnormality.  IMPRESSION: Normal MRV without evidence of venous sinus thrombosis.   Electronically Signed   By: Jeannine Boga M.D.   On: 08/16/2014 00:03    DISCHARGE EXAMINATION: Filed Vitals:   08/16/14 0400 08/16/14 0600 08/16/14 0946 08/16/14 1013  BP:  106/76 116/75 116/66  Pulse:  83 104 95  Temp:  97.4 F (36.3 C)  97 F (36.1 C)  TempSrc:  Oral  Oral  Resp: 20 18  18   Height:  Weight:      SpO2:  95%  94%   General appearance: alert, cooperative, appears stated age and no distress Resp: clear to auscultation bilaterally Cardio: regular rate and rhythm, S1, S2 normal, no murmur, click, rub or gallop GI: soft, non-tender; bowel sounds normal; no masses,  no organomegaly  DISPOSITION: Home  Discharge Instructions   Diet - low sodium heart healthy    Complete by:  As directed      Discharge instructions    Complete by:  As directed   Please follow up with your eye doctor as soon as possible for the double vision.     Increase activity slowly    Complete by:  As directed            ALLERGIES:  Allergies  Allergen Reactions  . Pineapple Swelling    Lips swelling  . Amoxicillin Nausea And Vomiting    REACTION: Nausea and vomiting    Current Discharge Medication List    START taking these medications   Details  metoprolol tartrate (LOPRESSOR) 25 MG tablet Take 1 tablet  (25 mg total) by mouth 2 (two) times daily. Qty: 60 tablet, Refills: 0      CONTINUE these medications which have NOT CHANGED   Details  ALPRAZolam (XANAX) 0.5 MG tablet Take 0.5 mg by mouth 3 (three) times daily.    amLODipine (NORVASC) 5 MG tablet Take 1 tablet (5 mg total) by mouth 2 (two) times daily. Qty: 180 tablet, Refills: 1   Associated Diagnoses: HTN (hypertension)    ARIPiprazole (ABILIFY) 5 MG tablet Take 5 mg by mouth at bedtime.    aspirin EC 81 MG tablet Take 81 mg by mouth daily.    atorvastatin (LIPITOR) 10 MG tablet Take 10 mg by mouth at bedtime.     Betaine (CYSTADANE) POWD Take 7 scoop by mouth 3 (three) times daily. Mix with 4-6 ounces of water For Homocysteneria    clomiPRAMINE (ANAFRANIL) 75 MG capsule Take 225 mg by mouth daily.    cycloSPORINE (RESTASIS) 0.05 % ophthalmic emulsion Place 1 drop into both eyes 2 (two) times daily.    folic acid (FOLVITE) 734 MCG tablet Take 1,200 mcg by mouth daily.    gabapentin (NEURONTIN) 300 MG capsule Take 900 mg by mouth 3 (three) times daily.    pyridoxine (B-6) 100 MG tablet Take 100 mg by mouth daily.    vitamin B-12 (CYANOCOBALAMIN) 1000 MCG tablet Take 1,000 mcg by mouth daily.    traZODone (DESYREL) 100 MG tablet Take 200 mg by mouth at bedtime.      STOP taking these medications     losartan-hydrochlorothiazide (HYZAAR) 100-12.5 MG per tablet        Follow-up Information   Follow up with [facility]. Schedule an appointment as soon as possible for a visit in 1 week. (post hospitalization follow up)    Contact information:   [address] [place] [address] [phone]      Follow up with Primary Care Physician. Schedule an appointment as soon as possible for a visit in 1 week. (Dr. Junius Creamer. , post hospitalization follow up)       TOTAL DISCHARGE TIME: 35 mins  [facility] Pager [phone]  08/16/2014, 12:14 PM  Disclaimer: This note  was dictated with voice recognition software. Similar sounding words can inadvertently be transcribed and may not be corrected upon review.

## 2014-08-16 NOTE — Progress Notes (Signed)
SLP Note  Patient Details Name: Jesus Crosby. MRN: [id number] DOB: 1965/12/19  Orders received for SLE. MRI report indicates "no acute infarct". Will complete SLE as able.  Kesia Dalto B. [facility], [facility] [place]   Shonna Chock 08/16/2014, 3:59 PM

## 2014-08-18 NOTE — Progress Notes (Signed)
RETRO REVIEW/ UR COMPLETED 

## 2014-08-29 NOTE — Telephone Encounter (Signed)
Never heard from ins w/decision. Called pharm who advised Rx has been going through ins, so PA was approved.

## 2014-10-01 ENCOUNTER — Ambulatory Visit: Payer: Self-pay

## 2014-10-01 ENCOUNTER — Emergency Department: Payer: Self-pay | Admitting: Emergency Medicine

## 2014-10-15 ENCOUNTER — Emergency Department: Payer: Self-pay | Admitting: Emergency Medicine

## 2014-10-30 ENCOUNTER — Encounter (HOSPITAL_COMMUNITY): Payer: Self-pay | Admitting: Emergency Medicine

## 2014-10-30 ENCOUNTER — Emergency Department (HOSPITAL_COMMUNITY)
Admission: EM | Admit: 2014-10-30 | Discharge: 2014-10-30 | Disposition: A | Discharge status: Home / Self Care | Payer: Medicaid Other | Attending: Emergency Medicine | Admitting: Emergency Medicine

## 2014-10-30 DIAGNOSIS — Z9841 Cataract extraction status, right eye: Secondary | ICD-10-CM | POA: Insufficient documentation

## 2014-10-30 DIAGNOSIS — Z880 Allergy status to penicillin: Secondary | ICD-10-CM | POA: Insufficient documentation

## 2014-10-30 DIAGNOSIS — Z8739 Personal history of other diseases of the musculoskeletal system and connective tissue: Secondary | ICD-10-CM | POA: Insufficient documentation

## 2014-10-30 DIAGNOSIS — Z9842 Cataract extraction status, left eye: Secondary | ICD-10-CM | POA: Diagnosis not present

## 2014-10-30 DIAGNOSIS — Z7982 Long term (current) use of aspirin: Secondary | ICD-10-CM | POA: Diagnosis not present

## 2014-10-30 DIAGNOSIS — S8992XA Unspecified injury of left lower leg, initial encounter: Secondary | ICD-10-CM | POA: Diagnosis not present

## 2014-10-30 DIAGNOSIS — Z8639 Personal history of other endocrine, nutritional and metabolic disease: Secondary | ICD-10-CM | POA: Insufficient documentation

## 2014-10-30 DIAGNOSIS — F42 Obsessive-compulsive disorder: Secondary | ICD-10-CM | POA: Insufficient documentation

## 2014-10-30 DIAGNOSIS — Y998 Other external cause status: Secondary | ICD-10-CM | POA: Insufficient documentation

## 2014-10-30 DIAGNOSIS — I10 Essential (primary) hypertension: Secondary | ICD-10-CM | POA: Insufficient documentation

## 2014-10-30 DIAGNOSIS — Z79899 Other long term (current) drug therapy: Secondary | ICD-10-CM | POA: Diagnosis not present

## 2014-10-30 DIAGNOSIS — X58XXXA Exposure to other specified factors, initial encounter: Secondary | ICD-10-CM | POA: Insufficient documentation

## 2014-10-30 DIAGNOSIS — M25562 Pain in left knee: Secondary | ICD-10-CM

## 2014-10-30 DIAGNOSIS — F419 Anxiety disorder, unspecified: Secondary | ICD-10-CM | POA: Diagnosis not present

## 2014-10-30 DIAGNOSIS — Y9389 Activity, other specified: Secondary | ICD-10-CM | POA: Diagnosis not present

## 2014-10-30 DIAGNOSIS — Y9289 Other specified places as the place of occurrence of the external cause: Secondary | ICD-10-CM | POA: Diagnosis not present

## 2014-10-30 DIAGNOSIS — Z8673 Personal history of transient ischemic attack (TIA), and cerebral infarction without residual deficits: Secondary | ICD-10-CM | POA: Insufficient documentation

## 2014-10-30 DIAGNOSIS — G8929 Other chronic pain: Secondary | ICD-10-CM

## 2014-10-30 DIAGNOSIS — F329 Major depressive disorder, single episode, unspecified: Secondary | ICD-10-CM | POA: Insufficient documentation

## 2014-10-30 NOTE — ED Notes (Signed)
Pt. reports chronic left knee pain / swelling for several months , denies recent injury , ambulatory , no fever or chills.

## 2014-10-30 NOTE — Discharge Instructions (Signed)

## 2014-10-30 NOTE — ED Notes (Signed)
Pt. Refused wheelchair 

## 2014-10-30 NOTE — ED Provider Notes (Signed)
CSN: [id number]     Arrival date & time 10/30/14  2206 History  This chart was scribed for non-physician practitioner working with No att. providers found by Mercy Moore, ED Scribe. This patient was seen in room TR07C/TR07C and the patient's care was started at 12:29 AM.   Chief Complaint  Patient presents with  . Knee Pain    The history is provided by the patient. No language interpreter was used.   HPI Comments: Jesus Crosby. is a 48 y.o. male who presents to the Emergency Department complaining of chronic left knee pain ongoing intemrittently for six months. Patient has been working with his PCP and reports attempts to establish care with an orthopedic surgeon, however is unable to get an appointment until January.. Patient reports appointment tomorrow that has been cancelled and rescheduled for January. Patient reports a negative X-ray 3-4 month ago. Patient reports a need for an MRI and is here tonight requesting to obtain it. Patient reports audible popping and clicking with movement, buckling of the knee with ambulation, and severe pain that he locates in the center. Patient reports exacerbated pain with flexion especially. Patient is on a array of medication and reports recent vomiting which he suspects is due to change in his medications. Patient denies redness of his knee, but reports noticeable swelling.   Past Medical History  Diagnosis Date  . Lens disease   . Brain bleed   . Cataract     bilateral repair with lens implants  . Depression   . Anxiety   . OCD (obsessive compulsive disorder)   . Neuromuscular disorder   . Homocystinuria   . Arthritis   . Hypertension   . Stroke    Past Surgical History  Procedure Laterality Date  . 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 Wrist surgery Right   . Dermabrasion of face      due to acne scars  . Eye surgery      LASER + SURG BIL   . Pars plana vitrectomy Right 09/17/2013     Procedure: PARS PLANA VITRECTOMY WITH 25G REMOVAL/SUTURE SECONDARY INTRAOCULAR LENS;  Surgeon: Hayden Pedro, MD;  Location: [place];  Service: Ophthalmology;  Laterality: Right;  . Photocoagulation Right 09/17/2013    Procedure: PHOTOCOAGULATION;  Surgeon: Hayden Pedro, MD;  Location: [place];  Service: Ophthalmology;  Laterality: Right;  HEADSCOPE LASER  . Gas/fluid exchange Right 09/17/2013    Procedure: GAS/FLUID EXCHANGE;  Surgeon: Hayden Pedro, MD;  Location: [place];  Service: Ophthalmology;  Laterality: Right;   Family History  Problem Relation Age of Onset  . Breast cancer Mother   . Stroke Other    History  Substance Use Topics  . Smoking status: Never Smoker   . Smokeless tobacco: Not on file  . Alcohol Use: No    Review of Systems  Constitutional: Negative for chills.  Musculoskeletal: Positive for joint swelling and arthralgias.  Neurological: Negative for weakness and numbness.    Allergies  Pineapple and Amoxicillin  Home Medications   Prior to Admission medications   Medication Sig Start Date End Date Taking? Authorizing Provider  ALPRAZolam Duanne Moron) 0.5 MG tablet Take 0.5 mg by mouth 3 (three) times daily.    Historical Provider, MD  amLODipine (NORVASC) 5 MG tablet Take 1 tablet (5 mg total) by mouth 2 (two) times daily. 01/02/14   Chelle S Jeffery, PA-C  ARIPiprazole (ABILIFY) 5 MG tablet Take  5 mg by mouth at bedtime.    Historical Provider, MD  aspirin EC 81 MG tablet Take 81 mg by mouth daily.    Historical Provider, MD  atorvastatin (LIPITOR) 10 MG tablet Take 10 mg by mouth at bedtime.     Historical Provider, MD  Betaine (CYSTADANE) POWD Take 7 scoop by mouth 3 (three) times daily. Mix with 4-6 ounces of water For Homocysteneria    Historical Provider, MD  clomiPRAMINE (ANAFRANIL) 75 MG capsule Take 225 mg by mouth daily.    Historical Provider, MD  cycloSPORINE (RESTASIS) 0.05 % ophthalmic emulsion Place 1 drop into both eyes 2 (two) times daily.     Historical Provider, MD  folic acid (FOLVITE) 811 MCG tablet Take 1,200 mcg by mouth daily.    Historical Provider, MD  gabapentin (NEURONTIN) 300 MG capsule Take 900 mg by mouth 3 (three) times daily.    Historical Provider, MD  metoprolol tartrate (LOPRESSOR) 25 MG tablet Take 1 tablet (25 mg total) by mouth 2 (two) times daily. 08/16/14   Bonnielee Haff, MD  pyridoxine (B-6) 100 MG tablet Take 100 mg by mouth daily.    Historical Provider, MD  traZODone (DESYREL) 100 MG tablet Take 200 mg by mouth at bedtime.    Historical Provider, MD  vitamin B-12 (CYANOCOBALAMIN) 1000 MCG tablet Take 1,000 mcg by mouth daily.    Historical Provider, MD   Triage Vitals: BP 139/93 mmHg  Temp(Src) 98.2 F (36.8 C) (Oral)  Resp 14  Ht 6\' 3"  (1.905 m)  Wt 322 lb (146.058 kg)  BMI 40.25 kg/m2  SpO2 97% Physical Exam  Constitutional: He is oriented to person, place, and time. He appears well-developed and well-nourished. No distress.  HENT:  Head: Normocephalic and atraumatic.  Eyes: EOM are normal.  Neck: Neck supple. No tracheal deviation present.  Cardiovascular: Normal rate.   Pulmonary/Chest: Effort normal. No respiratory distress.  Musculoskeletal: Normal range of motion.  Mild effusion on left knee No anterior or posterior instability. No medial/lateral instability.   90 to 100 deg of flexion 180 of extension  PT pulses is 2+. Motor strength 5 out of 5 at hip, knee, ankle.  Neurological: He is alert and oriented to person, place, and time.  Skin: Skin is warm and dry.  Psychiatric: He has a normal mood and affect. His behavior is normal.  Nursing note and vitals reviewed.   ED Course  Procedures (including critical care time)  COORDINATION OF CARE: 11:05 PM- Plans for consultation. Discussed treatment plan with patient at bedside and patient agreed to plan.   Labs Review Labs Reviewed - No data to display  Imaging Review No results found.   EKG Interpretation None      MDM    Final diagnoses:  Chronic knee pain, left    Patient here with chronic knee pain requesting to get an MRI tonight due to the fact the patient's follow-up with orthopedics is not until January. Patient states his PCP is in the process of arranging an outpatient MRI for his knee at this time. I advised patient that we will not set up an outpatient MRI for him since his PCP is already in the process of doing this. Patient has no new symptoms, no new etiology of his pain, and denies new injury or new issues with his knee. Patient denying any heat, redness, fever, signs or symptoms of septic joint. Patient reports that he has had multiple negative x-rays of his knee from his PCP,  and since this pain has been ongoing for 6 months and there is no new trauma to his knee, we will not follow up with further x-rays tonight. Patient states he came to the emergency room to see if he could get an MRI sooner. I discussed with patient that this was not appropriate at this time as there is no emergent need for an MRI of his ongoing knee pain. I discussed the patient that I could provide him with an outpatient resources to follow up with orthopedics in [place] to see if he could get an appointment sooner with them. I encouraged RICE therapy, and discussed return precautions with patient. Patient was agreeable to this plan. I encouraged patient to call or return to the ER should he have any questions or concerns.  I personally performed the services described in this documentation, which was scribed in my presence. The recorded information has been reviewed and is accurate.  BP 117/68 mmHg  Pulse 75  Temp(Src) 97.5 F (36.4 C) (Oral)  Resp 15  Ht 6\' 3"  (1.905 m)  Wt 322 lb (146.058 kg)  BMI 40.25 kg/m2  SpO2 94%  Signed,  Dahlia Bailiff, PA-C 12:29 AM   Carrie Mew, PA-C 10/31/14 [place], DO 10/31/14 1537

## 2014-11-02 ENCOUNTER — Emergency Department: Payer: Self-pay | Admitting: Emergency Medicine

## 2014-11-05 ENCOUNTER — Emergency Department: Payer: Self-pay | Admitting: Emergency Medicine

## 2014-11-14 ENCOUNTER — Ambulatory Visit: Payer: Self-pay | Admitting: Internal Medicine

## 2015-02-03 HISTORY — PX: NASAL SEPTUM SURGERY: SHX37

## 2015-04-15 ENCOUNTER — Emergency Department
Admission: EM | Admit: 2015-04-15 | Discharge: 2015-04-15 | Disposition: A | Discharge status: Home / Self Care | Payer: Medicaid Other | Attending: Emergency Medicine | Admitting: Emergency Medicine

## 2015-04-15 ENCOUNTER — Encounter: Payer: Self-pay | Admitting: *Deleted

## 2015-04-15 DIAGNOSIS — Z7982 Long term (current) use of aspirin: Secondary | ICD-10-CM | POA: Insufficient documentation

## 2015-04-15 DIAGNOSIS — J301 Allergic rhinitis due to pollen: Secondary | ICD-10-CM | POA: Diagnosis not present

## 2015-04-15 DIAGNOSIS — Z79899 Other long term (current) drug therapy: Secondary | ICD-10-CM | POA: Insufficient documentation

## 2015-04-15 DIAGNOSIS — Z880 Allergy status to penicillin: Secondary | ICD-10-CM | POA: Insufficient documentation

## 2015-04-15 DIAGNOSIS — I10 Essential (primary) hypertension: Secondary | ICD-10-CM | POA: Diagnosis not present

## 2015-04-15 DIAGNOSIS — R109 Unspecified abdominal pain: Secondary | ICD-10-CM | POA: Diagnosis present

## 2015-04-15 DIAGNOSIS — K429 Umbilical hernia without obstruction or gangrene: Secondary | ICD-10-CM | POA: Diagnosis not present

## 2015-04-15 HISTORY — DX: Umbilical hernia without obstruction or gangrene: K42.9

## 2015-04-15 HISTORY — DX: Sleep apnea, unspecified: G47.30

## 2015-04-15 LAB — COMPREHENSIVE METABOLIC PANEL
ALBUMIN: 4.5 g/dL (ref 3.5–5.0)
ALK PHOS: 61 U/L (ref 38–126)
ALT: 51 U/L (ref 17–63)
AST: 38 U/L (ref 15–41)
Anion gap: 8 (ref 5–15)
BUN: 11 mg/dL (ref 6–20)
CALCIUM: 9.1 mg/dL (ref 8.9–10.3)
CO2: 28 mmol/L (ref 22–32)
Chloride: 100 mmol/L — ABNORMAL LOW (ref 101–111)
Creatinine, Ser: 1.12 mg/dL (ref 0.61–1.24)
GFR calc Af Amer: >60 mL/min (ref >60)
GFR calc non Af Amer: >60 mL/min (ref >60)
Glucose, Bld: 107 mg/dL — ABNORMAL HIGH (ref 65–99)
Potassium: 4.1 mmol/L (ref 3.5–5.1)
Sodium: 136 mmol/L (ref 135–145)
TOTAL PROTEIN: 8.3 g/dL — AB (ref 6.5–8.1)
Total Bilirubin: 0.5 mg/dL (ref 0.3–1.2)

## 2015-04-15 LAB — CBC WITH DIFFERENTIAL/PLATELET
BASOS ABS: 0 10*3/uL (ref 0–0.1)
BASOS PCT: 0 %
EOS PCT: 1 %
Eosinophils Absolute: 0.1 10*3/uL (ref 0–0.7)
HEMATOCRIT: 45.7 % (ref 40.0–52.0)
Hemoglobin: 15.4 g/dL (ref 13.0–18.0)
Lymphocytes Relative: 31 %
Lymphs Abs: 1.8 10*3/uL (ref 1.0–3.6)
MCH: 30.7 pg (ref 26.0–34.0)
MCHC: 33.6 g/dL (ref 32.0–36.0)
MCV: 91.3 fL (ref 80.0–100.0)
MONO ABS: 0.5 10*3/uL (ref 0.2–1.0)
Monocytes Relative: 9 %
Neutro Abs: 3.6 10*3/uL (ref 1.4–6.5)
Neutrophils Relative %: 59 %
Platelets: 243 10*3/uL (ref 150–440)
RBC: 5.01 MIL/uL (ref 4.40–5.90)
RDW: 13.4 % (ref 11.5–14.5)
WBC: 6 10*3/uL (ref 3.8–10.6)

## 2015-04-15 LAB — URINALYSIS COMPLETE WITH MICROSCOPIC (ARMC ONLY)
Bacteria, UA: NONE SEEN
Bilirubin Urine: NEGATIVE
Glucose, UA: NEGATIVE mg/dL
HGB URINE DIPSTICK: NEGATIVE
Ketones, ur: NEGATIVE mg/dL
Leukocytes, UA: NEGATIVE
NITRITE: NEGATIVE
Protein, ur: NEGATIVE mg/dL
Specific Gravity, Urine: 1.017 (ref 1.005–1.030)
Squamous Epithelial / LPF: NONE SEEN
pH: 5 (ref 5.0–8.0)

## 2015-04-15 MED ORDER — OXYCODONE-ACETAMINOPHEN 5-325 MG PO TABS: 1.0000 | ORAL_TABLET | Freq: Four times a day (QID) | ORAL | Status: DC | PRN

## 2015-04-15 NOTE — ED Notes (Signed)
Pt reports small umbilical hernia that causes a burning pain at times. Denies n/v/d.  Reports small specs of blood in stool at times.

## 2015-04-15 NOTE — Discharge Instructions (Signed)
Umbilical Herniorrhaphy Herniorrhaphy is surgery to repair a hernia. A hernia is the protrusion of a part of an organ through an abdominal opening. An umbilical hernia means that your hernia is in the area around your navel. If the hernia is not repaired, the gap could get bigger. Your intestines or other tissues, such as fat, could get trapped in the gap. This can lead to other health problems, such as blocked intestines. If the hernia is fixed before problems set in, you may be allowed to go home the same day as the surgery (outpatient). LET [facility] CARE PROVIDER KNOW ABOUT:  Allergies to food or medicine.  Medicines taken, including vitamins, herbs, eye drops, over-the-counter medicines, and creams.  Use of steroids (by mouth or creams).  Previous problems with anesthetics or numbing medicines.  History of bleeding problems or blood clots.  Previous surgery.  Other health problems, including diabetes and kidney problems.  Possibility of pregnancy, if this applies. RISKS AND COMPLICATIONS  Pain.  Excessive bleeding.  Hematoma. This is a pocket of blood that collects under the surgery site.  Infection at the surgery site.  Numbness at the surgery site.  Swelling and bruising.  Blood clots.  Intestinal damage (rare).  Scarring.  Skin damage.  Development of another hernia. This may require another surgery. BEFORE THE PROCEDURE  Ask your health care provider about changing or stopping your regular medicines. You may need to stop taking aspirin, nonsteroidal anti-inflammatory drugs (NSAIDs), vitamin E, and blood thinners as early as 2 weeks before the procedure.  Do not eat or drink for 8 hours before the procedure, or as directed by your health care provider.  You might be asked to shower or wash with an antibacterial soap before the procedure.  Wear comfortable clothes that will be easy to put on after the procedure. PROCEDURE You will be given an intravenous  (IV) tube. A needle will be inserted in your arm. Medicine will flow directly into your body through this needle. You might be given medicine to help you relax (sedative). You will be given medicine that numbs the area (local anesthetic) or medicine that makes you sleep (general anesthetic). If you have open surgery:  The surgeon will make a cut (incision) in your abdomen.  The gap in the muscle wall will be repaired. The surgeon may sew the edges together over the gap or use a mesh material to strengthen the area. When mesh is used, the body grows new, strong tissue into and around it. This new tissue closes the gap.  A drain might be put in to remove excess fluid from the body after surgery.  The surgeon will close the incision with stitches, glue, or staples. If you have laparoscopic surgery:  The surgeon will make several small incisions in your abdomen.  A thin, lighted tube (laparoscope) will be inserted into the abdomen through an incision. A camera is attached to the laparoscope that allows the surgeon to see inside the abdomen.  Tools will be inserted through the other incisions to repair the hernia. Usually, mesh is used to cover the gap.  The surgeon will close the incisions with stitches. AFTER THE PROCEDURE  You will be taken to a recovery area. A nurse will watch and check your progress.  When you are awake, feeling well, and taking fluids well, you may be allowed to go home. In some cases, you may need to stay overnight in the hospital.  Arrange for someone to drive you home.  Document Released: 02/17/2009 Document Revised: 04/07/2014 Document Reviewed: 02/22/2012 [facility] Patient Information 2015 [place], [place]. This information is not intended to replace advice given to you by your health care provider. Make sure you discuss any questions you have with your health care provider. Hernia A hernia occurs when an internal organ pushes out through a weak spot in the abdominal  wall. Hernias most commonly occur in the groin and around the navel. Hernias often can be pushed back into place (reduced). Most hernias tend to get worse over time. Some abdominal hernias can get stuck in the opening (irreducible or incarcerated hernia) and cannot be reduced. An irreducible abdominal hernia which is tightly squeezed into the opening is at risk for impaired blood supply (strangulated hernia). A strangulated hernia is a medical emergency. Because of the risk for an irreducible or strangulated hernia, surgery may be recommended to repair a hernia. CAUSES   Heavy lifting.  Prolonged coughing.  Straining to have a bowel movement.  A cut (incision) made during an abdominal surgery. HOME CARE INSTRUCTIONS   Bed rest is not required. You may continue your normal activities.  Avoid lifting more than 10 pounds (4.5 kg) or straining.  Cough gently. If you are a smoker it is best to stop. Even the best hernia repair can break down with the continual strain of coughing. Even if you do not have your hernia repaired, a cough will continue to aggravate the problem.  Do not wear anything tight over your hernia. Do not try to keep it in with an outside bandage or truss. These can damage abdominal contents if they are trapped within the hernia sac.  Eat a normal diet.  Avoid constipation. Straining over long periods of time will increase hernia size and encourage breakdown of repairs. If you cannot do this with diet alone, stool softeners may be used. SEEK IMMEDIATE MEDICAL CARE IF:   You have a fever.  You develop increasing abdominal pain.  You feel nauseous or vomit.  Your hernia is stuck outside the abdomen, looks discolored, feels hard, or is tender.  You have any changes in your bowel habits or in the hernia that are unusual for you.  You have increased pain or swelling around the hernia.  You cannot push the hernia back in place by applying gentle pressure while lying  down. MAKE SURE YOU:   Understand these instructions.  Will watch your condition.  Will get help right away if you are not doing well or get worse. Document Released: 11/21/2005 Document Revised: 02/13/2012 Document Reviewed: 07/10/2008 [facility] Patient Information 2015 [place], [place]. This information is not intended to replace advice given to you by your health care provider. Make sure you discuss any questions you have with your health care provider.

## 2015-04-15 NOTE — ED Provider Notes (Signed)
[facility] Emergency Department Provider Note    Time seen: 1800  I have reviewed the triage vital signs and the nursing notes.   HISTORY  Chief Complaint Abdominal Pain    HPI Jesus Crosby. is a 49 y.o. male who presents for abdominal hernia in the umbilical area. Patient states her last besides some bulging and increased pain. In there for several years but has not caused him burning pain until now. He reports small specks of blood in his stool at times. Denies any nausea vomiting diarrhea, pressure in the area straining makes the pain worse.     Past Medical History  Diagnosis Date  . Lens disease   . Brain bleed   . Cataract     bilateral repair with lens implants  . Depression   . Anxiety   . OCD (obsessive compulsive disorder)   . Neuromuscular disorder   . Homocystinuria   . Arthritis   . Hypertension   . Stroke   . Umbilical hernia   . Sleep apnea     Patient Active Problem List   Diagnosis Date Noted  . Diplopia 08/15/2014  . Sinus tachycardia 08/15/2014  . CVA (cerebral infarction) 08/14/2014  . Intraocular lens dislocation 09/12/2013  . DDD (degenerative disc disease), lumbosacral 07/04/2013  . HTN (hypertension) 07/04/2013  . HOMOCYSTINURIA 05/28/2008  . HYPERLIPIDEMIA 05/28/2008  . ANXIETY 05/28/2008  . OBSESSIVE-COMPULSIVE DISORDER 05/28/2008    Past Surgical History  Procedure Laterality Date  . 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 Wrist surgery Right   . Dermabrasion of face      due to acne scars  . Eye surgery      LASER + SURG BIL   . Pars plana vitrectomy Right 09/17/2013    Procedure: PARS PLANA VITRECTOMY WITH 25G REMOVAL/SUTURE SECONDARY INTRAOCULAR LENS;  Surgeon: Hayden Pedro, MD;  Location: [place];  Service: Ophthalmology;  Laterality: Right;  . Photocoagulation Right 09/17/2013    Procedure: PHOTOCOAGULATION;  Surgeon: Hayden Pedro, MD;  Location: [place];  Service: Ophthalmology;  Laterality: Right;  HEADSCOPE LASER  . Gas/fluid exchange Right 09/17/2013    Procedure: GAS/FLUID EXCHANGE;  Surgeon: Hayden Pedro, MD;  Location: [place];  Service: Ophthalmology;  Laterality: Right;    Current Outpatient Rx  Name  Route  Sig  Dispense  Refill  . ALPRAZolam (XANAX) 0.5 MG tablet   Oral   Take 0.5 mg by mouth 3 (three) times daily.         Marland Kitchen amLODipine (NORVASC) 5 MG tablet   Oral   Take 1 tablet (5 mg total) by mouth 2 (two) times daily.   180 tablet   1   . ARIPiprazole (ABILIFY) 5 MG tablet   Oral   Take 5 mg by mouth at bedtime.         Marland Kitchen aspirin EC 81 MG tablet   Oral   Take 81 mg by mouth daily.         Marland Kitchen atorvastatin (LIPITOR) 10 MG tablet   Oral   Take 10 mg by mouth at bedtime.          . Betaine (CYSTADANE) POWD   Oral   Take 7 scoop by mouth 3 (three) times daily. Mix with 4-6 ounces of water For Homocysteneria         . clomiPRAMINE (ANAFRANIL) 75 MG capsule   Oral   Take  225 mg by mouth daily.         . cycloSPORINE (RESTASIS) 0.05 % ophthalmic emulsion   Both Eyes   Place 1 drop into both eyes 2 (two) times daily.         . folic acid (FOLVITE) 914 MCG tablet   Oral   Take 1,200 mcg by mouth daily.         Marland Kitchen gabapentin (NEURONTIN) 300 MG capsule   Oral   Take 900 mg by mouth 3 (three) times daily.         . metoprolol tartrate (LOPRESSOR) 25 MG tablet   Oral   Take 1 tablet (25 mg total) by mouth 2 (two) times daily.   60 tablet   0   . pyridoxine (B-6) 100 MG tablet   Oral   Take 100 mg by mouth daily.         . traZODone (DESYREL) 100 MG tablet   Oral   Take 200 mg by mouth at bedtime.         . vitamin B-12 (CYANOCOBALAMIN) 1000 MCG tablet   Oral   Take 1,000 mcg by mouth daily.           Allergies Pineapple and Amoxicillin  Family History  Problem Relation Age of Onset  . Breast cancer Mother   . Stroke Other     Social History History  Substance  Use Topics  . Smoking status: Never Smoker   . Smokeless tobacco: Not on file  . Alcohol Use: No    Review of Systems Constitutional: Negative for fever. Eyes: Negative for visual changes. ENT: Negative for sore throat. Cardiovascular: Negative for chest pain. Respiratory: Negative for shortness of breath. Gastrointestinal: Positive for abdominal pain, negative vomiting and diarrhea. Genitourinary: Negative for dysuria. Musculoskeletal: Negative for back pain. Skin: Negative for rash. Neurological: Negative for headaches, focal weakness or numbness.  10-point ROS otherwise negative.  ____________________________________________   PHYSICAL EXAM:  VITAL SIGNS: ED Triage Vitals  Enc Vitals Group     BP 04/15/15 1448 127/78 mmHg     Pulse Rate 04/15/15 1448 74     Resp 04/15/15 1448 20     Temp 04/15/15 1448 98.6 F (37 C)     Temp Source 04/15/15 1448 Oral     SpO2 04/15/15 1448 92 %     Weight 04/15/15 1448 296 lb (134.265 kg)     Height 04/15/15 1448 6\' 3"  (1.905 m)     Head Cir --      Peak Flow --      Pain Score 04/15/15 1448 8     Pain Loc --      Pain Edu? --      Excl. in [place]? --     Constitutional: Alert and oriented. Well appearing and in no distress. Eyes: Conjunctivae are normal. PERRL. Normal extraocular movements. ENT   Head: Normocephalic and atraumatic.   Nose: No congestion/rhinnorhea.   Mouth/Throat: Mucous membranes are moist.   Neck: No stridor. Hematological/Lymphatic/Immunilogical: No cervical lymphadenopathy. Cardiovascular: Normal rate, regular rhythm. Normal and symmetric distal pulses are present in all extremities. No murmurs, rubs, or gallops. Respiratory: Normal respiratory effort without tachypnea nor retractions. Breath sounds are clear and equal bilaterally. No wheezes/rales/rhonchi. Gastrointestinal: Patient has a reproducible umbilical hernia, no rebound or guarding positive bowel sounds Musculoskeletal: Nontender with  normal range of motion in all extremities. No joint effusions.  No lower extremity tenderness nor edema. Neurologic:  Normal speech and language.  No gross focal neurologic deficits are appreciated. Speech is normal. No gait instability. Skin:  Skin is warm, dry and intact. No rash noted. Psychiatric: Mood and affect are normal. Speech and behavior are normal. Patient exhibits appropriate insight and judgment.  ____________________________________________    LABS (pertinent positives/negatives)  Labs Reviewed  COMPREHENSIVE METABOLIC PANEL - Abnormal; Notable for the following:    Chloride 100 (*)    Glucose, Bld 107 (*)    Total Protein 8.3 (*)    All other components within normal limits  CBC WITH DIFFERENTIAL/PLATELET     ____________________________________________    RADIOLOGY  None  ____________________________________________    ED COURSE  Pertinent labs & imaging results that were available during my care of the patient were reviewed by me and considered in my medical decision making (see chart for details).  Patient has a repeat reducible umbilical hernia, and benign exam.  FINAL ASSESSMENT AND PLAN  Umbilical hernia   Plan: Patient needs surgery follow-up. Labs are normal and exam is relatively unremarkable. He certainly has no signs of incarceration. Will discharge with pain medicine and general surgery follow-up.    Earleen Newport, MD   Earleen Newport, MD 04/15/15 [id number]

## 2015-04-15 NOTE — ED Notes (Signed)
Pt reports he has an umbilical hernia. Pt states that over the past week he has had buldging in the area, burning around the area of the navel, soreness in lower abdomen, vomiting, and "spotting" of bleeding when he has a bowel movement.

## 2015-04-22 DIAGNOSIS — K429 Umbilical hernia without obstruction or gangrene: Secondary | ICD-10-CM | POA: Diagnosis not present

## 2015-04-23 DIAGNOSIS — I679 Cerebrovascular disease, unspecified: Secondary | ICD-10-CM | POA: Diagnosis not present

## 2015-04-30 ENCOUNTER — Other Ambulatory Visit: Payer: Medicaid Other

## 2015-05-05 ENCOUNTER — Encounter: Payer: Self-pay | Admitting: *Deleted

## 2015-05-05 DIAGNOSIS — G4730 Sleep apnea, unspecified: Secondary | ICD-10-CM | POA: Diagnosis not present

## 2015-05-05 DIAGNOSIS — Z6837 Body mass index (BMI) 37.0-37.9, adult: Secondary | ICD-10-CM | POA: Diagnosis not present

## 2015-05-05 DIAGNOSIS — Z7982 Long term (current) use of aspirin: Secondary | ICD-10-CM | POA: Diagnosis not present

## 2015-05-05 DIAGNOSIS — Z8673 Personal history of transient ischemic attack (TIA), and cerebral infarction without residual deficits: Secondary | ICD-10-CM | POA: Diagnosis not present

## 2015-05-05 DIAGNOSIS — E785 Hyperlipidemia, unspecified: Secondary | ICD-10-CM | POA: Diagnosis not present

## 2015-05-05 DIAGNOSIS — K219 Gastro-esophageal reflux disease without esophagitis: Secondary | ICD-10-CM | POA: Diagnosis not present

## 2015-05-05 DIAGNOSIS — I10 Essential (primary) hypertension: Secondary | ICD-10-CM | POA: Diagnosis not present

## 2015-05-05 DIAGNOSIS — G629 Polyneuropathy, unspecified: Secondary | ICD-10-CM | POA: Diagnosis not present

## 2015-05-05 DIAGNOSIS — F418 Other specified anxiety disorders: Secondary | ICD-10-CM | POA: Diagnosis not present

## 2015-05-05 DIAGNOSIS — Z79899 Other long term (current) drug therapy: Secondary | ICD-10-CM | POA: Diagnosis not present

## 2015-05-05 DIAGNOSIS — E669 Obesity, unspecified: Secondary | ICD-10-CM | POA: Diagnosis not present

## 2015-05-05 DIAGNOSIS — M1990 Unspecified osteoarthritis, unspecified site: Secondary | ICD-10-CM | POA: Diagnosis not present

## 2015-05-05 DIAGNOSIS — K429 Umbilical hernia without obstruction or gangrene: Secondary | ICD-10-CM | POA: Diagnosis not present

## 2015-05-05 NOTE — Patient Instructions (Signed)
  Your procedure is scheduled on: 05-07-15 Report to [place] To find out your arrival time please call [phone] between 1PM - 3PM on 05-06-15 [facility])  Remember: Instructions that are not followed completely may result in serious medical risk, up to and including death, or upon the discretion of your surgeon and anesthesiologist your surgery may need to be rescheduled.    _X___ 1. Do not eat food or drink liquids after midnight. No gum chewing or hard candies.     _X___ 2. No Alcohol for 24 hours before or after surgery.   ____ 3. Bring all medications with you on the day of surgery if instructed.    _X___ 4. Notify your doctor if there is any change in your medical condition     (cold, fever, infections).     Do not wear jewelry, make-up, hairpins, clips or nail polish.  Do not wear lotions, powders, or perfumes. You may wear deodorant.  Do not shave 48 hours prior to surgery. Men may shave face and neck.  Do not bring valuables to the hospital.    [facility] is not responsible for any belongings or valuables.               Contacts, dentures or bridgework may not be worn into surgery.  Leave your suitcase in the car. After surgery it may be brought to your room.  For patients admitted to the hospital, discharge time is determined by your                treatment team.   Patients discharged the day of surgery will not be allowed to drive home.   Please read over the following fact sheets that you were given:     _X___ Take these medicines the morning of surgery with A SIP OF WATER:    1. XANAX  2. AMLODIPINE  3. METOPROLOL  4. PAXIL  5. LYRICA  6.  ____ Fleet Enema (as directed)   _X___ Use CHG Soap as directed  ____ Use inhalers on the day of surgery  ____ Stop metformin 2 days prior to surgery    ____ Take 1/2 of usual insulin dose the night before surgery and none on the morning of surgery.   ____ Stop  Coumadin/Plavix/aspirin-ASK DR COOPER ABOUT ASPIRIN  ____ Stop Anti-inflammatories-NO NSAIDS OR ASPIRIN PRODUCTS-TYLENOL OK   ____ Stop supplements until after surgery.    _X__ Bring C-Pap to the hospital.

## 2015-05-06 ENCOUNTER — Encounter: Payer: Self-pay | Admitting: *Deleted

## 2015-05-06 ENCOUNTER — Encounter
Admission: RE | Admit: 2015-05-06 | Discharge: 2015-05-06 | Disposition: A | Discharge status: Home / Self Care | Payer: Medicare Other | Source: Ambulatory Visit | Attending: Surgery | Admitting: Surgery

## 2015-05-06 DIAGNOSIS — K429 Umbilical hernia without obstruction or gangrene: Secondary | ICD-10-CM | POA: Diagnosis not present

## 2015-05-06 DIAGNOSIS — Z01812 Encounter for preprocedural laboratory examination: Secondary | ICD-10-CM | POA: Insufficient documentation

## 2015-05-06 LAB — BASIC METABOLIC PANEL
Anion gap: 9 (ref 5–15)
BUN: 16 mg/dL (ref 6–20)
CHLORIDE: 102 mmol/L (ref 101–111)
CO2: 27 mmol/L (ref 22–32)
Calcium: 9 mg/dL (ref 8.9–10.3)
Creatinine, Ser: 0.95 mg/dL (ref 0.61–1.24)
GFR calc Af Amer: >60 mL/min (ref >60)
Glucose, Bld: 82 mg/dL (ref 65–99)
POTASSIUM: 4.4 mmol/L (ref 3.5–5.1)
Sodium: 138 mmol/L (ref 135–145)

## 2015-05-06 LAB — DIFFERENTIAL
BASOS PCT: 1 %
Basophils Absolute: 0 10*3/uL (ref 0–0.1)
EOS PCT: 1 %
Eosinophils Absolute: 0.1 10*3/uL (ref 0–0.7)
LYMPHS ABS: 2.3 10*3/uL (ref 1.0–3.6)
Lymphocytes Relative: 35 %
Monocytes Absolute: 0.7 10*3/uL (ref 0.2–1.0)
Monocytes Relative: 11 %
NEUTROS PCT: 52 %
Neutro Abs: 3.6 10*3/uL (ref 1.4–6.5)

## 2015-05-06 LAB — CBC
HEMATOCRIT: 42.6 % (ref 40.0–52.0)
HEMOGLOBIN: 14.6 g/dL (ref 13.0–18.0)
MCH: 31.5 pg (ref 26.0–34.0)
MCHC: 34.2 g/dL (ref 32.0–36.0)
MCV: 91.9 fL (ref 80.0–100.0)
Platelets: 207 10*3/uL (ref 150–440)
RBC: 4.63 MIL/uL (ref 4.40–5.90)
RDW: 13.4 % (ref 11.5–14.5)
WBC: 6.7 10*3/uL (ref 3.8–10.6)

## 2015-05-06 NOTE — OR Nursing (Signed)
PT IS AWARE TO ONLY MIX POWDER IN 4 OZ WATER AND TAKE OTHER MEDS WITH THIS AS INSTRUCTED. INSTRUCTED PT TO TAKE BETWEEN 0600-0630 IN THE MORNING.

## 2015-05-06 NOTE — OR Nursing (Signed)
SPOKE WITH DR Myra Gianotti ABOUT PT WITH HEMOCYSTINURIA- QUESTIONED MD ABOUT WHETHER OR NOT PT NEEDS TO TAKE HIS BETAINE POWDER PRIOR TO COMING IN FOR SURGERY. [place] @ 1130.  SHE WANTS PT TAKE THIS AROUND 7 AM. PT DILUTES POWDER IN 4-6 OZ WATER. CALLED PT AND INFORMED HIM TO TAKE THIS ALONG WITH OTHER MEDS THAT WERE INSTRUCTED ON INSTRUCTION SHEET

## 2015-05-07 ENCOUNTER — Ambulatory Visit: Payer: Medicaid Other | Admitting: Anesthesiology

## 2015-05-07 ENCOUNTER — Encounter: Payer: Self-pay | Admitting: Anesthesiology

## 2015-05-07 ENCOUNTER — Encounter
Admission: RE | Disposition: A | Discharge status: Home / Self Care | Payer: Self-pay | Source: Ambulatory Visit | Attending: Surgery

## 2015-05-07 ENCOUNTER — Ambulatory Visit
Admission: RE | Admit: 2015-05-07 | Discharge: 2015-05-07 | Disposition: A | Discharge status: Home / Self Care | Payer: Medicaid Other | Source: Ambulatory Visit | Attending: Surgery | Admitting: Surgery

## 2015-05-07 DIAGNOSIS — F418 Other specified anxiety disorders: Secondary | ICD-10-CM | POA: Insufficient documentation

## 2015-05-07 DIAGNOSIS — I10 Essential (primary) hypertension: Secondary | ICD-10-CM | POA: Insufficient documentation

## 2015-05-07 DIAGNOSIS — E785 Hyperlipidemia, unspecified: Secondary | ICD-10-CM | POA: Insufficient documentation

## 2015-05-07 DIAGNOSIS — Z7982 Long term (current) use of aspirin: Secondary | ICD-10-CM | POA: Insufficient documentation

## 2015-05-07 DIAGNOSIS — M1990 Unspecified osteoarthritis, unspecified site: Secondary | ICD-10-CM | POA: Insufficient documentation

## 2015-05-07 DIAGNOSIS — E669 Obesity, unspecified: Secondary | ICD-10-CM | POA: Insufficient documentation

## 2015-05-07 DIAGNOSIS — K219 Gastro-esophageal reflux disease without esophagitis: Secondary | ICD-10-CM | POA: Insufficient documentation

## 2015-05-07 DIAGNOSIS — G629 Polyneuropathy, unspecified: Secondary | ICD-10-CM | POA: Insufficient documentation

## 2015-05-07 DIAGNOSIS — G4730 Sleep apnea, unspecified: Secondary | ICD-10-CM | POA: Insufficient documentation

## 2015-05-07 DIAGNOSIS — K429 Umbilical hernia without obstruction or gangrene: Secondary | ICD-10-CM | POA: Insufficient documentation

## 2015-05-07 DIAGNOSIS — E7211 Homocystinuria: Secondary | ICD-10-CM | POA: Diagnosis not present

## 2015-05-07 DIAGNOSIS — Z79899 Other long term (current) drug therapy: Secondary | ICD-10-CM | POA: Insufficient documentation

## 2015-05-07 DIAGNOSIS — Z6837 Body mass index (BMI) 37.0-37.9, adult: Secondary | ICD-10-CM | POA: Insufficient documentation

## 2015-05-07 DIAGNOSIS — Z8673 Personal history of transient ischemic attack (TIA), and cerebral infarction without residual deficits: Secondary | ICD-10-CM | POA: Insufficient documentation

## 2015-05-07 HISTORY — DX: Gastro-esophageal reflux disease without esophagitis: K21.9

## 2015-05-07 HISTORY — DX: Family history of other specified conditions: Z84.89

## 2015-05-07 HISTORY — PX: UMBILICAL HERNIA REPAIR: SHX196

## 2015-05-07 SURGERY — REPAIR, HERNIA, UMBILICAL, ADULT
Anesthesia: General

## 2015-05-07 MED ORDER — HEPARIN SODIUM (PORCINE) 5000 UNIT/ML IJ SOLN
INTRAMUSCULAR | Status: AC
  Administered 2015-05-07: 5000 [IU] via SUBCUTANEOUS
  Filled 2015-05-07: qty 1

## 2015-05-07 MED ORDER — DEXAMETHASONE SODIUM PHOSPHATE 4 MG/ML IJ SOLN
INTRAMUSCULAR | Status: DC | PRN
  Administered 2015-05-07: 10 mg via INTRAVENOUS

## 2015-05-07 MED ORDER — FENTANYL CITRATE (PF) 100 MCG/2ML IJ SOLN
INTRAMUSCULAR | Status: DC | PRN
  Administered 2015-05-07: 100 ug via INTRAVENOUS

## 2015-05-07 MED ORDER — BUPIVACAINE-EPINEPHRINE (PF) 0.25% -1:200000 IJ SOLN
INTRAMUSCULAR | Status: DC | PRN
  Administered 2015-05-07: 30 mL

## 2015-05-07 MED ORDER — FENTANYL CITRATE (PF) 100 MCG/2ML IJ SOLN
25.0000 ug | INTRAMUSCULAR | Status: DC | PRN
  Administered 2015-05-07 (×4): 25 ug via INTRAVENOUS

## 2015-05-07 MED ORDER — SUGAMMADEX SODIUM 500 MG/5ML IV SOLN
INTRAVENOUS | Status: DC | PRN
Start: 2015-05-07 — End: 2015-05-07
  Administered 2015-05-07: 300 mg via INTRAVENOUS

## 2015-05-07 MED ORDER — MIDAZOLAM HCL 2 MG/2ML IJ SOLN
INTRAMUSCULAR | Status: DC | PRN
  Administered 2015-05-07: 2 mg via INTRAVENOUS

## 2015-05-07 MED ORDER — CIPROFLOXACIN IN D5W 400 MG/200ML IV SOLN
INTRAVENOUS | Status: AC
  Administered 2015-05-07: 400 mg via INTRAVENOUS
  Filled 2015-05-07: qty 200

## 2015-05-07 MED ORDER — HEPARIN SODIUM (PORCINE) 5000 UNIT/ML IJ SOLN
5000.0000 [IU] | Freq: Once | INTRAMUSCULAR | Status: AC
  Administered 2015-05-07: 5000 [IU] via SUBCUTANEOUS

## 2015-05-07 MED ORDER — ONDANSETRON HCL 4 MG/2ML IJ SOLN
INTRAMUSCULAR | Status: DC | PRN
  Administered 2015-05-07: 4 mg via INTRAVENOUS

## 2015-05-07 MED ORDER — CIPROFLOXACIN IN D5W 400 MG/200ML IV SOLN
400.0000 mg | Freq: Once | INTRAVENOUS | Status: AC
  Administered 2015-05-07: 400 mg via INTRAVENOUS

## 2015-05-07 MED ORDER — FAMOTIDINE 20 MG PO TABS
ORAL_TABLET | ORAL | Status: AC
  Administered 2015-05-07: 20 mg via ORAL
  Filled 2015-05-07: qty 1

## 2015-05-07 MED ORDER — ONDANSETRON HCL 4 MG/2ML IJ SOLN: 4.0000 mg | Freq: Once | INTRAMUSCULAR | Status: DC | PRN

## 2015-05-07 MED ORDER — GLYCOPYRROLATE 0.2 MG/ML IJ SOLN
INTRAMUSCULAR | Status: DC | PRN
  Administered 2015-05-07: 0.2 mg via INTRAVENOUS

## 2015-05-07 MED ORDER — PROPOFOL 10 MG/ML IV BOLUS
INTRAVENOUS | Status: DC | PRN
  Administered 2015-05-07: 50 mg via INTRAVENOUS
  Administered 2015-05-07: 200 mg via INTRAVENOUS

## 2015-05-07 MED ORDER — SUCCINYLCHOLINE CHLORIDE 20 MG/ML IJ SOLN
INTRAMUSCULAR | Status: DC | PRN
  Administered 2015-05-07: 120 mg via INTRAVENOUS

## 2015-05-07 MED ORDER — FAMOTIDINE 20 MG PO TABS
20.0000 mg | ORAL_TABLET | Freq: Once | ORAL | Status: AC
  Administered 2015-05-07: 20 mg via ORAL

## 2015-05-07 MED ORDER — LIDOCAINE HCL (CARDIAC) 20 MG/ML IV SOLN
INTRAVENOUS | Status: DC | PRN
  Administered 2015-05-07: 100 mg via INTRAVENOUS

## 2015-05-07 MED ORDER — ACETAMINOPHEN 10 MG/ML IV SOLN
INTRAVENOUS | Status: DC | PRN
  Administered 2015-05-07: 1000 mg via INTRAVENOUS

## 2015-05-07 MED ORDER — OXYCODONE-ACETAMINOPHEN 5-325 MG PO TABS: 1.0000 | ORAL_TABLET | ORAL | Status: DC | PRN

## 2015-05-07 MED ORDER — ACETAMINOPHEN 10 MG/ML IV SOLN
INTRAVENOUS | Status: AC
  Filled 2015-05-07: qty 100

## 2015-05-07 MED ORDER — ROCURONIUM BROMIDE 100 MG/10ML IV SOLN
INTRAVENOUS | Status: DC | PRN
  Administered 2015-05-07: 20 mg via INTRAVENOUS

## 2015-05-07 MED ORDER — LACTATED RINGERS IV SOLN
INTRAVENOUS | Status: DC
  Administered 2015-05-07 (×2): via INTRAVENOUS

## 2015-05-07 MED ORDER — FENTANYL CITRATE (PF) 100 MCG/2ML IJ SOLN
INTRAMUSCULAR | Status: AC
  Administered 2015-05-07: 25 ug via INTRAVENOUS
  Filled 2015-05-07: qty 2

## 2015-05-07 MED ORDER — BUPIVACAINE-EPINEPHRINE (PF) 0.25% -1:200000 IJ SOLN
INTRAMUSCULAR | Status: AC
  Filled 2015-05-07: qty 30

## 2015-05-07 MED ORDER — KETOROLAC TROMETHAMINE 30 MG/ML IJ SOLN
INTRAMUSCULAR | Status: DC | PRN
  Administered 2015-05-07: 30 mg via INTRAVENOUS

## 2015-05-07 SURGICAL SUPPLY — 28 items
ADHESIVE MASTISOL STRL (MISCELLANEOUS) ×2 IMPLANT
CANISTER SUCT 1200ML W/VALVE (MISCELLANEOUS) ×2 IMPLANT
DRAPE LAPAROTOMY 100X77 ABD (DRAPES) ×2 IMPLANT
GAUZE SPONGE 4X4 12PLY STRL (GAUZE/BANDAGES/DRESSINGS) IMPLANT
GLOVE BIO SURGEON STRL SZ8 (GLOVE) ×4 IMPLANT
GOWN STRL REUS W/ TWL LRG LVL3 (GOWN DISPOSABLE) ×2 IMPLANT
GOWN STRL REUS W/TWL LRG LVL3 (GOWN DISPOSABLE) ×2
LABEL OR SOLS (LABEL) IMPLANT
MASK SURG TIE (MASK) IMPLANT
MESH VENTRALEX ST 2.5 CRC MED (Mesh General) ×2 IMPLANT
NDL SAFETY 22GX1.5 (NEEDLE) ×2 IMPLANT
NS IRRIG 500ML POUR BTL (IV SOLUTION) ×2 IMPLANT
PACK BASIN MINOR ARMC (MISCELLANEOUS) ×2 IMPLANT
PAD GROUND ADULT SPLIT (MISCELLANEOUS) ×2 IMPLANT
SPONGE LAP 18X18 5 PK (GAUZE/BANDAGES/DRESSINGS) ×2 IMPLANT
STRIP CLOSURE SKIN 1/2X4 (GAUZE/BANDAGES/DRESSINGS) ×2 IMPLANT
SUT ETHIBOND 0 (SUTURE) IMPLANT
SUT ETHIBOND NAB CT1 #1 30IN (SUTURE) IMPLANT
SUT MNCRL 4-0 (SUTURE) ×1
SUT MNCRL 4-0 27XMFL (SUTURE) ×1
SUT PROLENE 0 CT 1 30 (SUTURE) IMPLANT
SUT PROLENE 0 CT 1 CR/8 (SUTURE) ×2 IMPLANT
SUT PROLENE 1 CT 1 30 (SUTURE) IMPLANT
SUT VIC AB 2-0 CT1 (SUTURE) ×2 IMPLANT
SUT VIC AB 3-0 SH 27 (SUTURE) ×1
SUT VIC AB 3-0 SH 27X BRD (SUTURE) ×1 IMPLANT
SUTURE MNCRL 4-0 27XMF (SUTURE) ×1 IMPLANT
SYRINGE 10CC LL (SYRINGE) ×2 IMPLANT

## 2015-05-07 NOTE — Discharge Instructions (Signed)
Remove dry dressings in the morning. May shower in the morning leaving the paper strips in place. No heavy lifting. Take oral analgesia excess prescribed. Return to see Dr. Burt Knack in 10-14 days.AMBULATORY SURGERY  DISCHARGE INSTRUCTIONS   1) The drugs that you were given will stay in your system until tomorrow so for the next 24 hours you should not:  A) Drive an automobile B) Make any legal decisions C) Drink any alcoholic beverage   2) You may resume regular meals tomorrow.  Today it is better to start with liquids and gradually work up to solid foods.  You may eat anything you prefer, but it is better to start with liquids, then soup and crackers, and gradually work up to solid foods.   3) Please notify your doctor immediately if you have any unusual bleeding, trouble breathing, redness and pain at the surgery site, drainage, fever, or pain not relieved by medication.             4)  5) Additional Instructions:Hospital Main Number:[phone]

## 2015-05-07 NOTE — Op Note (Signed)
Abdominal Hernia Repair  Pre-operative Diagnosis: Umbilical hernia  Post-operative Diagnosis: Umbilical hernia  Surgeon: Jerrol Banana. Burt Knack, MD FACS  Anesthesia: Gen. with endotracheal tube  Assistant: None  Procedure Details  The patient was seen again in the Holding Room. The benefits, complications, treatment options, and expected outcomes were discussed with the patient. The risks of bleeding, infection, recurrence of symptoms, failure to resolve symptoms, bowel injury, mesh placement, mesh infection, any of which could require further surgery were reviewed with the patient. The likelihood of improving the patient's symptoms with return to their baseline status is good.  The patient and/or family concurred with the proposed plan, giving informed consent.  The patient was taken to Operating Room, identified as Jesus Crosby. and the procedure verified.  A Time Out was held and the above information confirmed.  We also discussed the presence of a rectus diastases. He had questions concerning this which was explained to him in detail. This is not in the area of his periumbilical pain. And he understands that he is here for repair of the umbilical hernia only.  Prior to the induction of general anesthesia, antibiotic prophylaxis was administered. VTE prophylaxis was in place. General endotracheal anesthesia was then administered and tolerated well. After the induction, the abdomen was prepped with Chloraprep and draped in the sterile fashion. The patient was positioned in the supine position.  Local and static was infiltrated into the skin and subcutaneous tissues around the periumbilical area. A curvilinear incision was made in the infraumbilical area. Dissection down to the fascia was performed. The hernia sac was reduced and the preperitoneal space was developed with blunt dissection. The hernial rent was measured at approximately 2 cm. Into the preperitoneal space was placed in a VentraleX  patch medium in size 6.4 cm. This was held in with 0 proline sutures either figure-of-eight or u sutures. Additional Marcaine was placed for a total of 30 cc. Once assuring that hemostasis was adequate and the sponge lap needle count was correct the deep tissues were approximated with 3-0 and 2-0 Vicryls. The skin of the umbilicus was tacked down to the fascia with 3-0 Vicryls. 4-0 subcuticular Monocryl was used at all skin edges. Steri-Strips Mastisol and sterile dressings were placed.  Findings:   [facility]  Estimated Blood Loss: Minimal         Drains: None         Specimens: None        Complications: none               [name] E. Burt Knack, MD, FACS

## 2015-05-07 NOTE — Discharge Summary (Signed)
  Physician Discharge Summary  Patient ID: Jesus Crosby. MRN: [id number] DOB/AGE: 49-Mar-1967 49 y.o.  Admit date: 05/07/2015 Discharge date: 05/07/2015   Discharge Diagnoses:  Active Problems:   * No active hospital problems. *   Discharged Condition: stable  Procedures: [facility] repair with mesh  Hospital Course: This a patient who was taken from the preop holding area with a diagnosis of an umbilical hernia. This was repaired with mesh. He made an uncomplicated postoperative recovery and is discharged from the PACU in stable condition to follow-up in our office in 10 days.  Consults: None  Significant Diagnostic Studies: None   Disposition: 01-Home or Self Care     Medication List    ASK your doctor about these medications        ALPRAZolam 0.5 MG tablet  Commonly known as:  XANAX  Take 0.5 mg by mouth 3 (three) times daily.     amLODipine 5 MG tablet  Commonly known as:  NORVASC  Take 1 tablet (5 mg total) by mouth 2 (two) times daily.     aspirin EC 81 MG tablet  Take 81 mg by mouth daily.     atorvastatin 10 MG tablet  Commonly known as:  LIPITOR  Take 10 mg by mouth at bedtime.     cycloSPORINE 0.05 % ophthalmic emulsion  Commonly known as:  RESTASIS  Place 1 drop into both eyes 2 (two) times daily.     CYSTADANE Powd  - Take 7 scoop by mouth 3 (three) times daily. Mix with 4-6 ounces of water  - For Homocysteneria     folic acid 353 MCG tablet  Commonly known as:  FOLVITE  Take 1,200 mcg by mouth daily.     Metoprolol Tartrate 75 MG Tabs  Take 1 tablet by mouth 2 (two) times daily.     metoprolol tartrate 25 MG tablet  Commonly known as:  LOPRESSOR  Take 1 tablet (25 mg total) by mouth 2 (two) times daily.     oxyCODONE-acetaminophen 5-325 MG per tablet  Commonly known as:  ROXICET  Take 1 tablet by mouth every 6 (six) hours as needed.     PARoxetine 40 MG tablet  Commonly known as:  PAXIL  Take 60 mg by mouth every morning.     pregabalin  100 MG capsule  Commonly known as:  LYRICA  Take 100 mg by mouth 3 (three) times daily.     pyridoxine 100 MG tablet  Commonly known as:  B-6  Take 500 mg by mouth at bedtime.     sodium chloride 0.65 % nasal spray  Commonly known as:  OCEAN  Place 2 sprays into the nose 2 (two) times daily.     traZODone 100 MG tablet  Commonly known as:  DESYREL  Take 200 mg by mouth at bedtime.     vitamin B-12 1000 MCG tablet  Commonly known as:  CYANOCOBALAMIN  Take 1,000 mcg by mouth at bedtime.     ziprasidone 80 MG capsule  Commonly known as:  GEODON  Take 80 mg by mouth daily after supper.         Florene Glen, MD, FACS

## 2015-05-07 NOTE — Progress Notes (Signed)
Preoperative Review   Patient is met in the preoperative holding area. The history is reviewed in the chart and with the patient. I personally reviewed the options and rationale as well as the risks of this procedure that have been previously discussed with the patient. All questions asked by the patient and/or family were answered to their satisfaction.  Discussed pathophysiology of rectus diastasis.  Patient agrees to proceed with this procedure at this time.  Florene Glen M.D. FACS

## 2015-05-07 NOTE — Anesthesia Preprocedure Evaluation (Addendum)
Anesthesia Evaluation  Patient identified by MRN, date of birth, ID band Patient awake    Reviewed: Allergy & Precautions, NPO status , Patient's Chart, lab work & pertinent test results, reviewed documented beta blocker date and time   History of Anesthesia Complications (+) Family history of anesthesia reaction  Airway Mallampati: III  TM Distance: >3 FB Neck ROM: Full   Comment: Large neck and may be possible difficult intubation Dental  (+) Chipped, Caps, Dental Advisory Given   Pulmonary sleep apnea and Continuous Positive Airway Pressure Ventilation ,  breath sounds clear to auscultation  Pulmonary exam normal       Cardiovascular hypertension, Normal cardiovascular examRhythm:Regular Rate:Normal     Neuro/Psych PSYCHIATRIC DISORDERS Anxiety Depression Peripheral neuropathy  Neuromuscular disease CVA    GI/Hepatic Neg liver ROS, GERD-  Medicated and Controlled,  Endo/Other  negative endocrine ROS  Renal/GU negative Renal ROS  negative genitourinary   Musculoskeletal  (+) Arthritis -, Osteoarthritis,    Abdominal Normal abdominal exam  (+) + obese,   Peds negative pediatric ROS (+)  Hematology negative hematology ROS (+)   Anesthesia Other Findings   Reproductive/Obstetrics                          Anesthesia Physical Anesthesia Plan  ASA: III  Anesthesia Plan: General   Post-op Pain Management:    Induction: Intravenous  Airway Management Planned: Oral ETT  Additional Equipment:   Intra-op Plan:   Post-operative Plan: Extubation in OR  Informed Consent: I have reviewed the patients History and Physical, chart, labs and discussed the procedure including the risks, benefits and alternatives for the proposed anesthesia with the patient or authorized representative who has indicated his/her understanding and acceptance.   Dental advisory given  Plan Discussed with: CRNA and  Surgeon  Anesthesia Plan Comments:         Anesthesia Quick Evaluation

## 2015-05-07 NOTE — Transfer of Care (Signed)
  Immediate Anesthesia Transfer of Care Note  Patient: Jesus Crosby.  Procedure(s) Performed: Procedure(s): HERNIA REPAIR UMBILICAL ADULT (N/A)  Patient Location: PACU  Anesthesia Type:General  Level of Consciousness: sedated  Airway & Oxygen Therapy: Patient Spontanous Breathing and Patient connected to face mask oxygen  Post-op Assessment: Report given to RN and Post -op Vital signs reviewed and stable  Post vital signs: Reviewed and stable  Last Vitals:  Filed Vitals:   05/07/15 1405  BP: 131/85  Pulse: 85  Temp: 36.9 C  Resp: 15    Complications: No apparent anesthesia complications

## 2015-05-07 NOTE — Anesthesia Procedure Notes (Signed)
Procedure Name: Intubation Date/Time: 05/07/2015 1:15 PM Performed by: Doreen Salvage Pre-anesthesia Checklist: Patient identified, Patient being monitored, Timeout performed, Emergency Drugs available and Suction available Patient Re-evaluated:Patient Re-evaluated prior to inductionOxygen Delivery Method: Circle system utilized Preoxygenation: Pre-oxygenation with 100% oxygen Intubation Type: IV induction Ventilation: Mask ventilation without difficulty Laryngoscope Size: Mac and 3 Grade View: Grade I Tube type: Oral Tube size: 7.5 mm Number of attempts: 1 Airway Equipment and Method: Stylet Placement Confirmation: ETT inserted through vocal cords under direct vision,  positive ETCO2 and breath sounds checked- equal and bilateral Secured at: 23 cm Tube secured with: Tape Dental Injury: Teeth and Oropharynx as per pre-operative assessment

## 2015-05-07 NOTE — Anesthesia Postprocedure Evaluation (Signed)
  Anesthesia Post-op Note  Patient: Jesus Crosby.  Procedure(s) Performed: Procedure(s): HERNIA REPAIR UMBILICAL ADULT (N/A)  Anesthesia type:General  Patient location: PACU  Post pain: Pain level controlled  Post assessment: Post-op Vital signs reviewed, Patient's Cardiovascular Status Stable, Respiratory Function Stable, Patent Airway and No signs of Nausea or vomiting  Post vital signs: Reviewed and stable  Last Vitals:  Filed Vitals:   05/07/15 1405  BP: 131/85  Pulse: 85  Temp: 36.9 C  Resp: 15    Level of consciousness: awake, alert  and patient cooperative  Complications: No apparent anesthesia complications

## 2015-05-08 ENCOUNTER — Encounter: Payer: Self-pay | Admitting: Surgery

## 2015-05-14 DIAGNOSIS — J3089 Other allergic rhinitis: Secondary | ICD-10-CM | POA: Diagnosis not present

## 2015-05-14 DIAGNOSIS — J3081 Allergic rhinitis due to animal (cat) (dog) hair and dander: Secondary | ICD-10-CM | POA: Diagnosis not present

## 2015-05-18 ENCOUNTER — Other Ambulatory Visit: Payer: Medicaid Other

## 2015-05-20 ENCOUNTER — Ambulatory Visit: Payer: Self-pay | Admitting: Surgery

## 2015-05-21 DIAGNOSIS — Z6837 Body mass index (BMI) 37.0-37.9, adult: Secondary | ICD-10-CM | POA: Diagnosis not present

## 2015-05-21 DIAGNOSIS — G4733 Obstructive sleep apnea (adult) (pediatric): Secondary | ICD-10-CM | POA: Diagnosis not present

## 2015-05-21 DIAGNOSIS — J301 Allergic rhinitis due to pollen: Secondary | ICD-10-CM | POA: Diagnosis not present

## 2015-05-26 ENCOUNTER — Ambulatory Visit: Payer: Self-pay | Admitting: Surgery

## 2015-05-28 DIAGNOSIS — R0602 Shortness of breath: Secondary | ICD-10-CM | POA: Diagnosis not present

## 2015-05-28 DIAGNOSIS — R079 Chest pain, unspecified: Secondary | ICD-10-CM | POA: Diagnosis not present

## 2015-06-03 ENCOUNTER — Encounter (HOSPITAL_COMMUNITY): Payer: Self-pay | Admitting: *Deleted

## 2015-06-03 ENCOUNTER — Emergency Department (HOSPITAL_COMMUNITY)
Admission: EM | Admit: 2015-06-03 | Discharge: 2015-06-03 | Disposition: A | Discharge status: Home / Self Care | Payer: Medicare Other | Attending: Emergency Medicine | Admitting: Emergency Medicine

## 2015-06-03 ENCOUNTER — Emergency Department (HOSPITAL_COMMUNITY): Payer: Medicare Other

## 2015-06-03 DIAGNOSIS — F42 Obsessive-compulsive disorder: Secondary | ICD-10-CM | POA: Insufficient documentation

## 2015-06-03 DIAGNOSIS — Z8673 Personal history of transient ischemic attack (TIA), and cerebral infarction without residual deficits: Secondary | ICD-10-CM | POA: Diagnosis not present

## 2015-06-03 DIAGNOSIS — Z7982 Long term (current) use of aspirin: Secondary | ICD-10-CM | POA: Insufficient documentation

## 2015-06-03 DIAGNOSIS — Z79899 Other long term (current) drug therapy: Secondary | ICD-10-CM | POA: Diagnosis not present

## 2015-06-03 DIAGNOSIS — F419 Anxiety disorder, unspecified: Secondary | ICD-10-CM | POA: Insufficient documentation

## 2015-06-03 DIAGNOSIS — H538 Other visual disturbances: Secondary | ICD-10-CM | POA: Diagnosis not present

## 2015-06-03 DIAGNOSIS — Z880 Allergy status to penicillin: Secondary | ICD-10-CM | POA: Insufficient documentation

## 2015-06-03 DIAGNOSIS — G4733 Obstructive sleep apnea (adult) (pediatric): Secondary | ICD-10-CM | POA: Diagnosis not present

## 2015-06-03 DIAGNOSIS — Z8669 Personal history of other diseases of the nervous system and sense organs: Secondary | ICD-10-CM | POA: Diagnosis not present

## 2015-06-03 DIAGNOSIS — M1990 Unspecified osteoarthritis, unspecified site: Secondary | ICD-10-CM | POA: Insufficient documentation

## 2015-06-03 DIAGNOSIS — F329 Major depressive disorder, single episode, unspecified: Secondary | ICD-10-CM | POA: Insufficient documentation

## 2015-06-03 DIAGNOSIS — R51 Headache: Secondary | ICD-10-CM | POA: Diagnosis not present

## 2015-06-03 DIAGNOSIS — R200 Anesthesia of skin: Secondary | ICD-10-CM | POA: Diagnosis not present

## 2015-06-03 DIAGNOSIS — I10 Essential (primary) hypertension: Secondary | ICD-10-CM | POA: Diagnosis not present

## 2015-06-03 DIAGNOSIS — K219 Gastro-esophageal reflux disease without esophagitis: Secondary | ICD-10-CM | POA: Insufficient documentation

## 2015-06-03 LAB — DIFFERENTIAL
Basophils Absolute: 0 10*3/uL (ref 0.0–0.1)
Basophils Relative: 0 % (ref 0–1)
EOS ABS: 0.1 10*3/uL (ref 0.0–0.7)
Eosinophils Relative: 1 % (ref 0–5)
LYMPHS ABS: 1.8 10*3/uL (ref 0.7–4.0)
Lymphocytes Relative: 35 % (ref 12–46)
Monocytes Absolute: 0.6 10*3/uL (ref 0.1–1.0)
Monocytes Relative: 11 % (ref 3–12)
NEUTROS ABS: 2.8 10*3/uL (ref 1.7–7.7)
NEUTROS PCT: 53 % (ref 43–77)

## 2015-06-03 LAB — COMPREHENSIVE METABOLIC PANEL
ALT: 59 U/L (ref 17–63)
AST: 43 U/L — ABNORMAL HIGH (ref 15–41)
Albumin: 4 g/dL (ref 3.5–5.0)
Alkaline Phosphatase: 56 U/L (ref 38–126)
Anion gap: 8 (ref 5–15)
BILIRUBIN TOTAL: 0.9 mg/dL (ref 0.3–1.2)
BUN: 7 mg/dL (ref 6–20)
CALCIUM: 9.2 mg/dL (ref 8.9–10.3)
CO2: 30 mmol/L (ref 22–32)
CREATININE: 1.14 mg/dL (ref 0.61–1.24)
Chloride: 97 mmol/L — ABNORMAL LOW (ref 101–111)
GFR calc Af Amer: >60 mL/min (ref >60)
GFR calc non Af Amer: >60 mL/min (ref >60)
GLUCOSE: 92 mg/dL (ref 65–99)
Potassium: 4.4 mmol/L (ref 3.5–5.1)
SODIUM: 135 mmol/L (ref 135–145)
TOTAL PROTEIN: 7.9 g/dL (ref 6.5–8.1)

## 2015-06-03 LAB — CBC
HCT: 43.3 % (ref 39.0–52.0)
Hemoglobin: 14.8 g/dL (ref 13.0–17.0)
MCH: 31.2 pg (ref 26.0–34.0)
MCHC: 34.2 g/dL (ref 30.0–36.0)
MCV: 91.4 fL (ref 78.0–100.0)
PLATELETS: 182 10*3/uL (ref 150–400)
RBC: 4.74 MIL/uL (ref 4.22–5.81)
RDW: 13.6 % (ref 11.5–15.5)
WBC: 5.3 10*3/uL (ref 4.0–10.5)

## 2015-06-03 LAB — APTT: APTT: 29 s (ref 24–37)

## 2015-06-03 LAB — I-STAT TROPONIN, ED: Troponin i, poc: 0 ng/mL (ref 0.00–0.08)

## 2015-06-03 LAB — I-STAT CHEM 8, ED
BUN: 9 mg/dL (ref 6–20)
Calcium, Ion: 1.18 mmol/L (ref 1.12–1.23)
Chloride: 96 mmol/L — ABNORMAL LOW (ref 101–111)
Creatinine, Ser: 1.2 mg/dL (ref 0.61–1.24)
Glucose, Bld: 88 mg/dL (ref 65–99)
HCT: 48 % (ref 39.0–52.0)
Hemoglobin: 16.3 g/dL (ref 13.0–17.0)
POTASSIUM: 4.4 mmol/L (ref 3.5–5.1)
SODIUM: 136 mmol/L (ref 135–145)
TCO2: 30 mmol/L (ref 0–100)

## 2015-06-03 LAB — PROTIME-INR
INR: 1.14 (ref 0.00–1.49)
Prothrombin Time: 14.8 seconds (ref 11.6–15.2)

## 2015-06-03 LAB — CBG MONITORING, ED: Glucose-Capillary: 83 mg/dL (ref 65–99)

## 2015-06-03 NOTE — ED Provider Notes (Signed)
  Physical Exam  BP 108/67 mmHg  Pulse 62  Temp(Src) 97.4 F (36.3 C) (Oral)  Resp 13  SpO2 92%  Physical Exam  Constitutional: He appears well-developed and well-nourished. No distress.  HENT:  Head: Atraumatic.  Eyes: Conjunctivae are normal.  Neck: Neck supple.  Neurological: He is alert. He has normal strength. No cranial nerve deficit or sensory deficit. Gait normal. GCS eye subscore is 4. GCS verbal subscore is 5. GCS motor subscore is 6.  Skin: No rash noted.  Psychiatric: He has a normal mood and affect.  Nursing note and vitals reviewed.   ED Course  Procedures  MDM Received signout from my partner at the beginning of shift. Patient here with facial numbness. An MRI was ordered. Results indicate no evidence of acute strokes. Suspect idiopathic intracranial hypertension were noted. Currently blood pressures are normal. I discussed the finding with patient and recommend patient to follow-up closely with neurologist, Dr. Jannifer Franklin for further management. Return precautions discussed. Otherwise patient is stable for discharge.  BP 125/78 mmHg  Pulse 69  Temp(Src) 97.4 F (36.3 C) (Oral)  Resp 13  SpO2 93%  I have reviewed nursing notes and vital signs. I personally viewed the imaging tests through PACS system and agrees with radiologist's intepretation I reviewed available ER/hospitalization records through the EMR    Domenic Moras, PA-C 06/03/15 2047  Daleen Bo, MD 06/04/15 [phone]

## 2015-06-03 NOTE — ED Notes (Signed)
POC CBG resulted 83; Lisa, PA present in room

## 2015-06-03 NOTE — ED Notes (Signed)
Patient transported to MRI 

## 2015-06-03 NOTE — Discharge Instructions (Signed)
You have been evaluated for your symptoms.  No evidence of acute stroke on today's exam.  MRI did mention evidence of idiopathic intracranial hypertension.  Please follow up closely with your neurologist Dr. Jannifer Franklin for further evaluation of your condition.  Return if your condition worsen or if you have other concerns.

## 2015-06-03 NOTE — ED Provider Notes (Signed)
  Face-to-face evaluation   History: He presents for evaluation of numbness right arm, similar to prior symptoms when he had a brain bleed, many years ago. He has mild headache at this time.  Physical exam: Alert, cooperative. No dysarthria and aphasia or nystagmus. Mild decreased light touch sensation right arm and right face.  Medical screening examination/treatment/procedure(s) were conducted as a shared visit with non-physician practitioner(s) and myself.  I personally evaluated the patient during the encounter  Daleen Bo, MD 06/04/15 [phone]

## 2015-06-03 NOTE — ED Provider Notes (Signed)
CSN: [id number]     Arrival date & time 06/03/15  1300 History   First MD Initiated Contact with Patient 06/03/15 1425     Chief Complaint  Patient presents with  . Numbness     (Consider location/radiation/quality/duration/timing/severity/associated sxs/prior Treatment) The history is provided by the patient and medical records.    This is a 49 year old male with past medical history significant for depression, anxiety, OCD, neuromuscular disorder, hypertension, homocystinuria, GERD, prior embolic stroke at age 46 without residual deficits, presenting to the ED for right arm and facial numbness, onset 0300 this morning.  Patient states he did awake with a slight headache, nothing significant to him.  He denies any numbness of his lower extremities. No weakness of extremities. No dizziness, lightheadedness, or feelings of syncope. He does endorse appear mildly blurry in his right eye, left eye normal.  No vision loss.  No trauma or FB exposure to eye.  Denies eye pain.  He denies any neck pain or injuries. Patient is on daily aspirin, no other anticoagulation. Patient is followed by neurology, Dr. Jannifer Franklin.  Past Medical History  Diagnosis Date  . Lens disease   . Brain bleed   . Cataract     bilateral repair with lens implants  . Depression   . Anxiety   . OCD (obsessive compulsive disorder)   . Neuromuscular disorder   . Homocystinuria   . Arthritis   . Hypertension   . Umbilical hernia   . Sleep apnea   . GERD (gastroesophageal reflux disease)   . Family history of adverse reaction to anesthesia     n/v-mom  . Stroke     with brain bleed at age 74-no deficits   Past Surgical History  Procedure Laterality Date  . 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 Wrist surgery Right   . Dermabrasion of face      due to acne scars  . Eye surgery      LASER + SURG BIL   . Pars plana vitrectomy Right 09/17/2013    Procedure: PARS PLANA VITRECTOMY  WITH 25G REMOVAL/SUTURE SECONDARY INTRAOCULAR LENS;  Surgeon: Hayden Pedro, MD;  Location: [place];  Service: Ophthalmology;  Laterality: Right;  . Photocoagulation Right 09/17/2013    Procedure: PHOTOCOAGULATION;  Surgeon: Hayden Pedro, MD;  Location: [place];  Service: Ophthalmology;  Laterality: Right;  HEADSCOPE LASER  . Gas/fluid exchange Right 09/17/2013    Procedure: GAS/FLUID EXCHANGE;  Surgeon: Hayden Pedro, MD;  Location: [place];  Service: Ophthalmology;  Laterality: Right;  . Nasal septum surgery  march 2016    @ [facility]  . Umbilical hernia repair N/A 05/07/2015    Procedure: HERNIA REPAIR UMBILICAL ADULT;  Surgeon: Florene Glen, MD;  Location: [facility];  Service: General;  Laterality: N/A;   Family History  Problem Relation Age of Onset  . Breast cancer Mother   . Stroke Other    History  Substance Use Topics  . Smoking status: Never Smoker   . Smokeless tobacco: Never Used  . Alcohol Use: No    Review of Systems  Neurological: Positive for numbness.  All other systems reviewed and are negative.     Allergies  Pineapple and Amoxicillin  Home Medications   Prior to Admission medications   Medication Sig Start Date End Date Taking? Authorizing Provider  ALPRAZolam Duanne Moron) 0.5 MG tablet Take 0.5 mg by mouth 3 (three) times daily.  Historical Provider, MD  amLODipine (NORVASC) 5 MG tablet Take 1 tablet (5 mg total) by mouth 2 (two) times daily. 01/02/14   Chelle Jeffery, PA-C  aspirin EC 81 MG tablet Take 81 mg by mouth daily.    Historical Provider, MD  atorvastatin (LIPITOR) 10 MG tablet Take 10 mg by mouth at bedtime.     Historical Provider, MD  Betaine (CYSTADANE) POWD Take 7 scoop by mouth 3 (three) times daily. Mix with 4-6 ounces of water For Homocysteneria    Historical Provider, MD  cycloSPORINE (RESTASIS) 0.05 % ophthalmic emulsion Place 1 drop into both eyes 2 (two) times daily.    Historical Provider, MD  folic acid (FOLVITE) 778 MCG tablet Take  1,200 mcg by mouth daily.    Historical Provider, MD  metoprolol tartrate (LOPRESSOR) 25 MG tablet Take 1 tablet (25 mg total) by mouth 2 (two) times daily. Patient taking differently: Take 37.5 mg by mouth 2 (two) times daily.  08/16/14   Bonnielee Haff, MD  Metoprolol Tartrate 75 MG TABS Take 1 tablet by mouth 2 (two) times daily.    Historical Provider, MD  oxyCODONE-acetaminophen (ROXICET) 5-325 MG per tablet Take 1 tablet by mouth every 6 (six) hours as needed. 04/15/15   Earleen Newport, MD  oxyCODONE-acetaminophen (ROXICET) 5-325 MG per tablet Take 1 tablet by mouth every 4 (four) hours as needed for severe pain. 05/07/15   Florene Glen, MD  PARoxetine (PAXIL) 40 MG tablet Take 60 mg by mouth every morning.    Historical Provider, MD  pregabalin (LYRICA) 100 MG capsule Take 100 mg by mouth 3 (three) times daily.     Historical Provider, MD  pyridoxine (B-6) 100 MG tablet Take 500 mg by mouth at bedtime.     Historical Provider, MD  sodium chloride (OCEAN) 0.65 % nasal spray Place 2 sprays into the nose 2 (two) times daily. 02/27/15 02/27/16  Historical Provider, MD  traZODone (DESYREL) 100 MG tablet Take 200 mg by mouth at bedtime.    Historical Provider, MD  vitamin B-12 (CYANOCOBALAMIN) 1000 MCG tablet Take 1,000 mcg by mouth at bedtime.     Historical Provider, MD  ziprasidone (GEODON) 80 MG capsule Take 80 mg by mouth daily after supper.     Historical Provider, MD   BP 121/78 mmHg  Pulse 58  Temp(Src) 98.5 F (36.9 C) (Oral)  Resp 17  SpO2 96%   Physical Exam  Constitutional: He is oriented to person, place, and time. He appears well-developed and well-nourished.  HENT:  Head: Normocephalic and atraumatic.  Mouth/Throat: Oropharynx is clear and moist.  Eyes: Conjunctivae and EOM are normal. Pupils are equal, round, and reactive to light.  EOMs fully intact, PERRL  Neck: Normal range of motion.  Cardiovascular: Normal rate, regular rhythm and normal heart sounds.    Pulmonary/Chest: Effort normal and breath sounds normal.  Abdominal: Soft. Bowel sounds are normal.  Musculoskeletal: Normal range of motion.  Neurological: He is alert and oriented to person, place, and time.  AAOx3, answering questions and following commands appropriately; equal strength UE and LE bilaterally; moves all extremities without ataxia; altered sensation of right cheek and right arm when compared with left; normal sensation of lower extremities; normal coordination, normal speech, no appreciable facial droop  Skin: Skin is warm and dry.  Psychiatric: He has a normal mood and affect.  Nursing note and vitals reviewed.   ED Course  Procedures (including critical care time) Labs Review Labs Reviewed  COMPREHENSIVE  METABOLIC PANEL - Abnormal; Notable for the following:    Chloride 97 (*)    AST 43 (*)    All other components within normal limits  I-STAT CHEM 8, ED - Abnormal; Notable for the following:    Chloride 96 (*)    All other components within normal limits  PROTIME-INR  APTT  CBC  DIFFERENTIAL  I-STAT TROPOININ, ED  CBG MONITORING, ED    Imaging Review Ct Head (brain) Wo Contrast  06/03/2015   CLINICAL DATA:  Right upper extremity and right face numbness. Onset at 3 a.m.  EXAM: CT HEAD WITHOUT CONTRAST  TECHNIQUE: Contiguous axial images were obtained from the base of the skull through the vertex without intravenous contrast.  COMPARISON:  08/15/2014 MRI.  Most recent CT of 08/15/2014  FINDINGS: Sinuses/Soft tissues: Clear paranasal sinuses and mastoid air cells.  Intracranial: Mild cerebral volume loss for age, with relative prominence of the extra-axial spaces, including adjacent the frontal lobes. No mass lesion, hemorrhage, hydrocephalus, acute infarct, intra-axial, or extra-axial fluid collection.  IMPRESSION: 1.  No acute intracranial abnormality. 2. Mildly age advanced cerebral atrophy.   Electronically Signed   By: Abigail Miyamoto M.D.   On: 06/03/2015 14:19      EKG Interpretation   Date/Time:  Wednesday June 03 2015 13:06:39 EDT Ventricular Rate:  66 PR Interval:  168 QRS Duration: 86 QT Interval:  430 QTC Calculation: 450 R Axis:   47 Text Interpretation:  Normal sinus rhythm Normal ECG Sinus rhythm Artifact  T wave abnormality Borderline ECG Confirmed by Carmin Muskrat  MD ([id number])  on 06/03/2015 2:56:27 PM      MDM   Final diagnoses:  Numbness   49 year old male here with numbness of right face and arm, onset 0300 this morning.  Hx of embolic stroke at age 17.  He is followed regularly by neurology, Dr. Jannifer Franklin. No history of neck issues, no neck pain currently. Neurologic exam remarkable for altered sensation of right cheek and arm when compared with left. There is no focal weakness.  Lab work and CT head obtained in triage, no acute findings.  Given patient's history and current symptoms, will send for MRI to rule out acute stroke.  4:46 PM MRI pending.  If negative for acute findings that would warrant admission, feel patient may be discharged home to follow-up with his neurologist.  Larene Pickett, PA-C 06/03/15 1647  Carmin Muskrat, MD 06/05/15 0030

## 2015-06-03 NOTE — ED Notes (Signed)
Pt reports onset this am 0300 or numbness sensation to right arm and face. Grips are equal at triage, no facial droop noted.

## 2015-06-03 NOTE — ED Notes (Signed)
Neuro check due, pt in MRI. Will complete neuro check upon arrival back to unit

## 2015-06-09 DIAGNOSIS — G4730 Sleep apnea, unspecified: Secondary | ICD-10-CM | POA: Diagnosis not present

## 2015-06-09 DIAGNOSIS — E876 Hypokalemia: Secondary | ICD-10-CM | POA: Diagnosis not present

## 2015-06-09 DIAGNOSIS — M792 Neuralgia and neuritis, unspecified: Secondary | ICD-10-CM | POA: Diagnosis not present

## 2015-06-09 DIAGNOSIS — I270 Primary pulmonary hypertension: Secondary | ICD-10-CM | POA: Diagnosis not present

## 2015-06-09 DIAGNOSIS — I2584 Coronary atherosclerosis due to calcified coronary lesion: Secondary | ICD-10-CM | POA: Diagnosis not present

## 2015-06-09 DIAGNOSIS — I679 Cerebrovascular disease, unspecified: Secondary | ICD-10-CM | POA: Diagnosis not present

## 2015-06-09 DIAGNOSIS — G932 Benign intracranial hypertension: Secondary | ICD-10-CM | POA: Diagnosis not present

## 2015-06-10 ENCOUNTER — Encounter: Payer: Self-pay | Admitting: Neurology

## 2015-06-10 ENCOUNTER — Ambulatory Visit (INDEPENDENT_AMBULATORY_CARE_PROVIDER_SITE_OTHER): Payer: Medicare Other | Admitting: Neurology

## 2015-06-10 VITALS — BP 133/80 | HR 79 | Ht 75.0 in | Wt 292.6 lb

## 2015-06-10 DIAGNOSIS — E7210 Disorders of sulfur-bearing amino-acid metabolism, unspecified: Secondary | ICD-10-CM

## 2015-06-10 DIAGNOSIS — R202 Paresthesia of skin: Secondary | ICD-10-CM | POA: Diagnosis not present

## 2015-06-10 HISTORY — DX: Paresthesia of skin: R20.2

## 2015-06-10 MED ORDER — TOPIRAMATE 25 MG PO TABS: ORAL_TABLET | ORAL | Status: DC

## 2015-06-10 NOTE — Progress Notes (Signed)
Reason for visit: Right-sided numbness  Referring physician: [place]  Lemuel Boodram. is a 49 y.o. male  History of present illness:  Jesus Crosby is a 49 year old right-handed white male with a history of homocystinemia. The patient has been seen through this office greater than 8 years ago with stroke like symptoms at that time. The patient is currently on therapy for the homocystinemia. He was admitted to the hospital in September 2015 with double vision. This resolved in about 6 weeks. A full stroke workup at that time was unremarkable including MRV of the head and CT angiogram of the head and neck. The patient had been doing well until around 05/31/2015. The patient noted onset of right-sided numbness upon awakening around 3 AM. The numbness involves the right arm and hand spreading to the right face. Eventually, the numbness has also involved the right leg. He went to emergency room and MRI of the brain was unremarkable. No evidence of an acute stroke was seen. The patient is continuing to have right-sided numbness, but he also has a right-sided headache. On MRI, he was found to have an empty sella, and some enlargement of the optic nerve sheaths consistent with a pseudotumor cerebri type syndrome. This was present on the MRI study from September 2015 as well. The patient indicates that he has severe sleep apnea, he is on CPAP at night, and he recently was diagnosed with pulmonary hypertension that was also felt related to the sleep apnea. The patient denies any weakness, or significant balance problems. He is on aspirin therapy. He takes vitamin B6 and vitamin Q46 and folic acid.  Past Medical History  Diagnosis Date  . Lens disease   . Brain bleed   . Cataract     bilateral repair with lens implants  . Depression   . Anxiety   . OCD (obsessive compulsive disorder)   . Neuromuscular disorder   . Homocystinuria   . Arthritis   . Hypertension   . Umbilical hernia   . Sleep apnea   .  GERD (gastroesophageal reflux disease)   . Family history of adverse reaction to anesthesia     n/v-mom  . Stroke     with brain bleed at age 13-no deficits  . Paresthesia 06/10/2015    Past Surgical History  Procedure Laterality Date  . 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 Wrist surgery Right   . Dermabrasion of face      due to acne scars  . Eye surgery      LASER + SURG BIL   . Pars plana vitrectomy Right 09/17/2013    Procedure: PARS PLANA VITRECTOMY WITH 25G REMOVAL/SUTURE SECONDARY INTRAOCULAR LENS;  Surgeon: Hayden Pedro, MD;  Location: [place];  Service: Ophthalmology;  Laterality: Right;  . Photocoagulation Right 09/17/2013    Procedure: PHOTOCOAGULATION;  Surgeon: Hayden Pedro, MD;  Location: [place];  Service: Ophthalmology;  Laterality: Right;  HEADSCOPE LASER  . Gas/fluid exchange Right 09/17/2013    Procedure: GAS/FLUID EXCHANGE;  Surgeon: Hayden Pedro, MD;  Location: [place];  Service: Ophthalmology;  Laterality: Right;  . Nasal septum surgery  march 2016    @ [facility]  . Umbilical hernia repair N/A 05/07/2015    Procedure: HERNIA REPAIR UMBILICAL ADULT;  Surgeon: Florene Glen, MD;  Location: [facility];  Service: General;  Laterality: N/A;    Family History  Problem Relation Age of Onset  .  Breast cancer Mother   . Stroke Other   . Stroke Maternal Grandmother   . Stroke Maternal Grandfather   . Stroke Paternal Grandmother   . Stroke Paternal Grandfather     Social history:  reports that he has never smoked. He has never used smokeless tobacco. He reports that he does not drink alcohol or use illicit drugs.  Medications:  Prior to Admission medications   Medication Sig Start Date End Date Taking? Authorizing Provider  ALPRAZolam Duanne Moron) 0.5 MG tablet Take 0.5 mg by mouth 3 (three) times daily.    Historical Provider, MD  amLODipine (NORVASC) 5 MG tablet Take 1 tablet (5 mg total) by mouth 2 (two) times daily. 01/02/14    Chelle Jeffery, PA-C  ARIPiprazole (ABILIFY) 10 MG tablet Take 10 mg by mouth at bedtime.    Historical Provider, MD  aspirin EC 81 MG tablet Take 81 mg by mouth daily.    Historical Provider, MD  atorvastatin (LIPITOR) 10 MG tablet Take 10 mg by mouth at bedtime.     Historical Provider, MD  Betaine (CYSTADANE) POWD Take 7 scoop by mouth 3 (three) times daily. Mix with 4-6 ounces of water For Homocysteneria    Historical Provider, MD  cycloSPORINE (RESTASIS) 0.05 % ophthalmic emulsion Place 1 drop into both eyes 2 (two) times daily.    Historical Provider, MD  esomeprazole (NEXIUM) 20 MG capsule Take 20 mg by mouth daily at 12 noon.    Historical Provider, MD  folic acid (FOLVITE) 096 MCG tablet Take 1,200 mcg by mouth daily.    Historical Provider, MD  metoprolol tartrate (LOPRESSOR) 25 MG tablet Take 1 tablet (25 mg total) by mouth 2 (two) times daily. Patient not taking: Reported on 06/03/2015 08/16/14   Bonnielee Haff, MD  Metoprolol Tartrate 75 MG TABS Take 1 tablet by mouth 2 (two) times daily.    Historical Provider, MD  oxyCODONE-acetaminophen (ROXICET) 5-325 MG per tablet Take 1 tablet by mouth every 6 (six) hours as needed. Patient not taking: Reported on 06/03/2015 04/15/15   Earleen Newport, MD  oxyCODONE-acetaminophen (ROXICET) 5-325 MG per tablet Take 1 tablet by mouth every 4 (four) hours as needed for severe pain. 05/07/15   Florene Glen, MD  PARoxetine (PAXIL) 40 MG tablet Take 60 mg by mouth at bedtime.     Historical Provider, MD  pregabalin (LYRICA) 100 MG capsule Take 100 mg by mouth 3 (three) times daily.     Historical Provider, MD  pyridoxine (B-6) 100 MG tablet Take 500 mg by mouth at bedtime.     Historical Provider, MD  sodium chloride (OCEAN) 0.65 % nasal spray Place 2 sprays into the nose 2 (two) times daily. 02/27/15 02/27/16  Historical Provider, MD  traZODone (DESYREL) 100 MG tablet Take 200 mg by mouth at bedtime.    Historical Provider, MD  vitamin B-12  (CYANOCOBALAMIN) 1000 MCG tablet Take 1,000 mcg by mouth at bedtime.     Historical Provider, MD  ziprasidone (GEODON) 80 MG capsule Take 80 mg by mouth daily after supper.     Historical Provider, MD  ziprasidone (GEODON) 80 MG capsule Take 80 mg by mouth daily after supper.    Historical Provider, MD      Allergies  Allergen Reactions  . Pineapple Swelling    Lips swelling  . Amoxicillin Nausea And Vomiting    REACTION: Nausea and vomiting    ROS:  Out of a complete 14 system review of symptoms, the patient  complains only of the following symptoms, and all other reviewed systems are negative.  Fatigue Blurred vision, eye pain Snoring Diarrhea Joint pain Allergies Headache, numbness, weakness, dizziness   Blood pressure 133/80, pulse 79, height 6\' 3"  (1.905 m), weight 292 lb 9.6 oz (132.722 kg).  Physical Exam  General: The patient is alert and cooperative at the time of the examination. The patient is moderately to markedly obese.  Eyes: Pupils are equal, round, and reactive to light. Discs are flat bilaterally.  Neck: The neck is supple, no carotid bruits are noted.  Respiratory: The respiratory examination is clear.  Cardiovascular: The cardiovascular examination reveals a regular rate and rhythm, no obvious murmurs or rubs are noted.  Neuromuscular: Range of movement of the cervical spine is full.  Skin: Extremities are without significant edema.  Neurologic Exam  Mental status: The patient is alert and oriented x 3 at the time of the examination. The patient has apparent normal recent and remote memory, with an apparently normal attention span and concentration ability.  Cranial nerves: Facial symmetry is present. There is good sensation of the face to pinprick and soft touch on the left, decreased on the right, the patient splits the midline with vibration sensation of the forehead, decreased on the right. The strength of the facial muscles and the muscles to  head turning and shoulder shrug are normal bilaterally. Speech is well enunciated, no aphasia or dysarthria is noted. Extraocular movements are full. Visual fields are full. The tongue is midline, and the patient has symmetric elevation of the soft palate. No obvious hearing deficits are noted.  Motor: The motor testing reveals 5 over 5 strength of all 4 extremities. Good symmetric motor tone is noted throughout.  Sensory: Sensory testing is intact to pinprick, soft touch, vibration sensation, and position sense on all 4 extremities, with the exception that pinprick sensation is decreased on the right arm and right leg relative to the left. No evidence of extinction is noted.  Coordination: Cerebellar testing reveals good finger-nose-finger and heel-to-shin bilaterally.  Gait and station: Gait is normal. Tandem gait is normal. Romberg is negative. No drift is seen.  Reflexes: Deep tendon reflexes are symmetric and normal bilaterally. Toes are downgoing bilaterally.    MRI brain 06/03/15:   IMPRESSION: Suspected idiopathic intracranial hypertension, with marked empty sella and dilated optic nerve sheaths. These changes were observed in 2015 and are stable.  No evidence for acute stroke.  * MRI scan images were reviewed online. I agree with the written report.   Assessment/Plan:  1. Homocystinemia  2. Right hemisensory deficit  3. Nonorganic clinical examination  4. Sleep apnea  5. Pulmonary hypertension  The patient has a right hemisensory deficit that does not correlate with a stroke event on MRI of the brain. The patient is splitting the midline with vibration sensation of the forehead suggesting nonorganic features. The patient is having right-sided headaches, right-sided numbness. The features of pseudotumor cerebri seen by MRI may be related to the obstructive sleep apnea. The patient will be placed on Topamax, he will follow-up in about 3 months. He will be seeing his  ophthalmologist for a good ophthalmologic evaluation. He will remain on aspirin.  Jill Alexanders MD 06/10/2015 8:03 PM  [facility] [address] [place], [place] [address]  Phone [phone] Fax [phone]

## 2015-06-10 NOTE — Patient Instructions (Signed)
   Topamax (topiramate) is a seizure medication that has an FDA approval for seizures and for migraine headache. Potential side effects of this medication include weight loss, cognitive slowing, tingling in the fingers and toes, and carbonated drinks will taste bad. If any significant side effects are noted on this drug, please contact our office.  

## 2015-06-16 DIAGNOSIS — H532 Diplopia: Secondary | ICD-10-CM | POA: Diagnosis not present

## 2015-06-17 DIAGNOSIS — J301 Allergic rhinitis due to pollen: Secondary | ICD-10-CM | POA: Diagnosis not present

## 2015-06-18 ENCOUNTER — Telehealth: Payer: Self-pay | Admitting: Neurology

## 2015-06-18 NOTE — Telephone Encounter (Signed)
The patient was seen by Dr. Philis Kendall. No evidence of pseudotumor cerebri was seen on clinical examination.

## 2015-06-22 ENCOUNTER — Ambulatory Visit: Payer: Medicaid Other | Admitting: Neurology

## 2015-06-24 DIAGNOSIS — J301 Allergic rhinitis due to pollen: Secondary | ICD-10-CM | POA: Diagnosis not present

## 2015-06-26 DIAGNOSIS — I270 Primary pulmonary hypertension: Secondary | ICD-10-CM | POA: Diagnosis not present

## 2015-06-26 DIAGNOSIS — R0602 Shortness of breath: Secondary | ICD-10-CM | POA: Diagnosis not present

## 2015-06-26 DIAGNOSIS — R5383 Other fatigue: Secondary | ICD-10-CM | POA: Diagnosis not present

## 2015-06-26 DIAGNOSIS — G4733 Obstructive sleep apnea (adult) (pediatric): Secondary | ICD-10-CM | POA: Diagnosis not present

## 2015-06-29 DIAGNOSIS — R5383 Other fatigue: Secondary | ICD-10-CM | POA: Diagnosis not present

## 2015-06-29 DIAGNOSIS — I270 Primary pulmonary hypertension: Secondary | ICD-10-CM | POA: Diagnosis not present

## 2015-06-29 DIAGNOSIS — R0602 Shortness of breath: Secondary | ICD-10-CM | POA: Diagnosis not present

## 2015-06-29 DIAGNOSIS — I10 Essential (primary) hypertension: Secondary | ICD-10-CM | POA: Diagnosis not present

## 2015-06-29 DIAGNOSIS — E0781 Sick-euthyroid syndrome: Secondary | ICD-10-CM | POA: Diagnosis not present

## 2015-06-29 DIAGNOSIS — R972 Elevated prostate specific antigen [PSA]: Secondary | ICD-10-CM | POA: Diagnosis not present

## 2015-06-29 DIAGNOSIS — E079 Disorder of thyroid, unspecified: Secondary | ICD-10-CM | POA: Diagnosis not present

## 2015-06-29 DIAGNOSIS — R5381 Other malaise: Secondary | ICD-10-CM | POA: Diagnosis not present

## 2015-06-29 DIAGNOSIS — G4733 Obstructive sleep apnea (adult) (pediatric): Secondary | ICD-10-CM | POA: Diagnosis not present

## 2015-06-30 DIAGNOSIS — I270 Primary pulmonary hypertension: Secondary | ICD-10-CM | POA: Diagnosis not present

## 2015-06-30 DIAGNOSIS — G4733 Obstructive sleep apnea (adult) (pediatric): Secondary | ICD-10-CM | POA: Diagnosis not present

## 2015-07-08 DIAGNOSIS — J301 Allergic rhinitis due to pollen: Secondary | ICD-10-CM | POA: Diagnosis not present

## 2015-07-13 ENCOUNTER — Emergency Department
Admission: EM | Admit: 2015-07-13 | Discharge: 2015-07-13 | Disposition: A | Discharge status: Home / Self Care | Payer: Medicare Other | Attending: Emergency Medicine | Admitting: Emergency Medicine

## 2015-07-13 ENCOUNTER — Encounter: Payer: Self-pay | Admitting: Emergency Medicine

## 2015-07-13 DIAGNOSIS — K625 Hemorrhage of anus and rectum: Secondary | ICD-10-CM | POA: Diagnosis not present

## 2015-07-13 DIAGNOSIS — R197 Diarrhea, unspecified: Secondary | ICD-10-CM | POA: Insufficient documentation

## 2015-07-13 DIAGNOSIS — I10 Essential (primary) hypertension: Secondary | ICD-10-CM | POA: Diagnosis not present

## 2015-07-13 DIAGNOSIS — Z880 Allergy status to penicillin: Secondary | ICD-10-CM | POA: Insufficient documentation

## 2015-07-13 LAB — COMPREHENSIVE METABOLIC PANEL
ALT: 77 U/L — AB (ref 17–63)
AST: 99 U/L — ABNORMAL HIGH (ref 15–41)
Albumin: 4 g/dL (ref 3.5–5.0)
Alkaline Phosphatase: 56 U/L (ref 38–126)
Anion gap: 6 (ref 5–15)
BILIRUBIN TOTAL: 0.7 mg/dL (ref 0.3–1.2)
BUN: 12 mg/dL (ref 6–20)
CALCIUM: 9 mg/dL (ref 8.9–10.3)
CO2: 25 mmol/L (ref 22–32)
Chloride: 102 mmol/L (ref 101–111)
Creatinine, Ser: 0.98 mg/dL (ref 0.61–1.24)
GFR calc non Af Amer: >60 mL/min (ref >60)
Glucose, Bld: 94 mg/dL (ref 65–99)
Potassium: 4.2 mmol/L (ref 3.5–5.1)
Sodium: 133 mmol/L — ABNORMAL LOW (ref 135–145)
Total Protein: 7.7 g/dL (ref 6.5–8.1)

## 2015-07-13 LAB — CBC WITH DIFFERENTIAL/PLATELET
BASOS ABS: 0 10*3/uL (ref 0–0.1)
Basophils Relative: 1 %
Eosinophils Absolute: 0.1 10*3/uL (ref 0–0.7)
Eosinophils Relative: 1 %
HCT: 40.5 % (ref 40.0–52.0)
Hemoglobin: 13.8 g/dL (ref 13.0–18.0)
LYMPHS PCT: 28 %
Lymphs Abs: 2.2 10*3/uL (ref 1.0–3.6)
MCH: 31.2 pg (ref 26.0–34.0)
MCHC: 34 g/dL (ref 32.0–36.0)
MCV: 92 fL (ref 80.0–100.0)
MONO ABS: 0.9 10*3/uL (ref 0.2–1.0)
Monocytes Relative: 11 %
NEUTROS ABS: 4.7 10*3/uL (ref 1.4–6.5)
Neutrophils Relative %: 59 %
PLATELETS: 226 10*3/uL (ref 150–440)
RBC: 4.41 MIL/uL (ref 4.40–5.90)
RDW: 14.7 % — ABNORMAL HIGH (ref 11.5–14.5)
WBC: 8 10*3/uL (ref 3.8–10.6)

## 2015-07-13 MED ORDER — HYDROCORTISONE ACETATE 25 MG RE SUPP: 25.0000 mg | Freq: Two times a day (BID) | RECTAL | Status: DC

## 2015-07-13 MED ORDER — HYOSCYAMINE SULFATE 0.125 MG SL SUBL: 0.1250 mg | SUBLINGUAL_TABLET | SUBLINGUAL | Status: DC | PRN

## 2015-07-13 NOTE — Discharge Instructions (Signed)
Rectal Bleeding °Rectal bleeding is when blood passes out of the anus. It is usually a sign that something is wrong. It may not be serious, but it should always be evaluated. Rectal bleeding may present as bright red blood or extremely dark stools. The color may range from dark red or maroon to black (like tar). It is important that the cause of rectal bleeding be identified so treatment can be started and the problem corrected. °CAUSES  °· Hemorrhoids. These are enlarged (dilated) blood vessels or veins in the anal or rectal area. °· Fistulas. These are abnormal, burrowing channels that usually run from inside the rectum to the skin around the anus. They can bleed. °· Anal fissures. This is a tear in the tissue of the anus. Bleeding occurs with bowel movements. °· Diverticulosis. This is a condition in which pockets or sacs project from the bowel wall. Occasionally, the sacs can bleed. °· Diverticulitis. This is an infection involving diverticulosis of the colon. °· Proctitis and colitis. These are conditions in which the rectum, colon, or both, can become inflamed and pitted (ulcerated). °· Polyps and cancer. Polyps are non-cancerous (benign) growths in the colon that may bleed. Certain types of polyps turn into cancer. °· Protrusion of the rectum. Part of the rectum can project from the anus and bleed. °· Certain medicines. °· Intestinal infections. °· Blood vessel abnormalities. °HOME CARE INSTRUCTIONS °· Eat a high-fiber diet to keep your stool soft. °· Limit activity. °· Drink enough fluids to keep your urine clear or pale yellow. °· Warm baths may be useful to soothe rectal pain. °· Follow up with your caregiver as directed. °SEEK IMMEDIATE MEDICAL CARE IF: °· You develop increased bleeding. °· You have black or dark red stools. °· You vomit blood or material that looks like coffee grounds. °· You have abdominal pain or tenderness. °· You have a fever. °· You feel weak, nauseous, or you faint. °· You have  severe rectal pain or you are unable to have a bowel movement. °MAKE SURE YOU: °· Understand these instructions. °· Will watch your condition. °· Will get help right away if you are not doing well or get worse. °Document Released: 05/13/2002 Document Revised: 02/13/2012 Document Reviewed: 05/08/2011 °ExitCare® Patient Information ©2015 [employer]. This information is not intended to replace advice given to you by your health care provider. Make sure you discuss any questions you have with your health care provider. ° °

## 2015-07-13 NOTE — ED Provider Notes (Signed)
[facility] Emergency Department Provider Note     Time seen: ----------------------------------------- 5:03 PM on 07/13/2015 -----------------------------------------    I have reviewed the triage vital signs and the nursing notes.   HISTORY  Chief Complaint Rectal Bleeding    HPI Jesus Crosby. is a 49 y.o. male who presents ER with bright red blood per rectum since yesterday. Reports some mid abdominal pain. Concerned because he had periumbilical surgery about 7 weeks ago. Patient reports he was moving recently and had left some boxes, concerned may have affected his surgical site. Has had extensive rectal bleeding over the last couple days, has chronic diarrhea and this is unchanged.   Past Medical History  Diagnosis Date  . Lens disease   . Brain bleed   . Cataract     bilateral repair with lens implants  . Depression   . Anxiety   . OCD (obsessive compulsive disorder)   . Neuromuscular disorder   . Homocystinuria   . Arthritis   . Hypertension   . Umbilical hernia   . Sleep apnea   . GERD (gastroesophageal reflux disease)   . Family history of adverse reaction to anesthesia     n/v-mom  . Stroke     with brain bleed at age 7-no deficits  . Paresthesia 06/10/2015    Patient Active Problem List   Diagnosis Date Noted  . Paresthesia 06/10/2015  . Diplopia 08/15/2014  . Sinus tachycardia 08/15/2014  . CVA (cerebral infarction) 08/14/2014  . Intraocular lens dislocation 09/12/2013  . DDD (degenerative disc disease), lumbosacral 07/04/2013  . HTN (hypertension) 07/04/2013  . 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 HYPERLIPIDEMIA 05/28/2008  . ANXIETY 05/28/2008  . OBSESSIVE-COMPULSIVE DISORDER 05/28/2008    Past Surgical History  Procedure Laterality Date  . 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 Wrist surgery Right   . Dermabrasion of face      due to acne scars   . Eye surgery      LASER + SURG BIL   . Pars plana vitrectomy Right 09/17/2013    Procedure: PARS PLANA VITRECTOMY WITH 25G REMOVAL/SUTURE SECONDARY INTRAOCULAR LENS;  Surgeon: Hayden Pedro, MD;  Location: [place];  Service: Ophthalmology;  Laterality: Right;  . Photocoagulation Right 09/17/2013    Procedure: PHOTOCOAGULATION;  Surgeon: Hayden Pedro, MD;  Location: [facility];  Service: Ophthalmology;  Laterality: Right;  HEADSCOPE LASER  . Gas/fluid exchange Right 09/17/2013    Procedure: GAS/FLUID EXCHANGE;  Surgeon: Hayden Pedro, MD;  Location: [place];  Service: Ophthalmology;  Laterality: Right;  . Nasal septum surgery  march 2016    @ [facility]  . Umbilical hernia repair N/A 05/07/2015    Procedure: HERNIA REPAIR UMBILICAL ADULT;  Surgeon: Florene Glen, MD;  Location: [facility];  Service: General;  Laterality: N/A;    Allergies Pineapple and Amoxicillin  Social History History  Substance Use Topics  . Smoking status: Never Smoker   . Smokeless tobacco: Never Used  . Alcohol Use: No    Review of Systems Constitutional: Negative for fever. Eyes: Negative for visual changes. ENT: Negative for sore throat. Cardiovascular: Negative for chest pain. Respiratory: Negative for shortness of breath. Gastrointestinal: Positive for abdominal pain and diarrhea, positive for rectal bleeding Genitourinary: Negative for dysuria. Musculoskeletal: Negative for back pain. Skin: Negative for rash. Neurological: Negative for headaches, focal weakness or numbness.  10-point ROS otherwise negative.  ____________________________________________  PHYSICAL EXAM:  VITAL SIGNS: ED Triage Vitals  Enc Vitals Group     BP 07/13/15 1523 130/79 mmHg     Pulse Rate 07/13/15 1523 75     Resp 07/13/15 1523 18     Temp 07/13/15 1523 98.3 F (36.8 C)     Temp Source 07/13/15 1523 Oral     SpO2 07/13/15 1523 97 %     Weight 07/13/15 1523 298 lb (135.172 kg)     Height 07/13/15 1523 6\' 3"  (1.905 m)      Head Cir --      Peak Flow --      Pain Score 07/13/15 1525 7     Pain Loc --      Pain Edu? --      Excl. in [place]? --     Constitutional: Alert and oriented. Well appearing and in no distress. Eyes: Conjunctivae are normal. PERRL. Normal extraocular movements. ENT   Head: Normocephalic and atraumatic.   Nose: No congestion/rhinnorhea.   Mouth/Throat: Mucous membranes are moist.   Neck: No stridor. Cardiovascular: Normal rate, regular rhythm. Normal and symmetric distal pulses are present in all extremities. No murmurs, rubs, or gallops. Respiratory: Normal respiratory effort without tachypnea nor retractions. Breath sounds are clear and equal bilaterally. No wheezes/rales/rhonchi. Gastrointestinal: Soft and nontender. No distention. No abdominal bruits. Tender rectal exam, visible bright blood. Musculoskeletal: Nontender with normal range of motion in all extremities. No joint effusions.  No lower extremity tenderness nor edema. Neurologic:  Normal speech and language. No gross focal neurologic deficits are appreciated. Speech is normal. No gait instability. Skin:  Skin is warm, dry and intact. No rash noted. Psychiatric: Mood and affect are normal. Speech and behavior are normal. Patient exhibits appropriate insight and judgment.  ____________________________________________  ED COURSE:  Pertinent labs & imaging results that were available during my care of the patient were reviewed by me and considered in my medical decision making (see chart for details). We'll obtain basic labs and reevaluation. ____________________________________________    LABS (pertinent positives/negatives)  Labs Reviewed  COMPREHENSIVE METABOLIC PANEL - Abnormal; Notable for the following:    Sodium 133 (*)    AST 99 (*)    ALT 77 (*)    All other components within normal limits  CBC WITH DIFFERENTIAL/PLATELET - Abnormal; Notable for the following:    RDW 14.7 (*)    All other  components within normal limits  CBC WITH DIFFERENTIAL/PLATELET    ____________________________________________  FINAL ASSESSMENT AND PLAN  Rectal bleeding  Plan: Patient with labs as dictated above. Unclear etiology for bleeding. We'll place him on Anusol suppositories and referred to GI for close follow-up. Labs are unremarkable this time.   Earleen Newport, MD   Earleen Newport, MD 07/13/15 [phone]

## 2015-07-13 NOTE — ED Notes (Signed)
Reports dark rectal bleeding since yesterday, mild abd pain

## 2015-07-14 ENCOUNTER — Encounter: Payer: Self-pay | Admitting: Gastroenterology

## 2015-07-15 DIAGNOSIS — J301 Allergic rhinitis due to pollen: Secondary | ICD-10-CM | POA: Diagnosis not present

## 2015-07-21 ENCOUNTER — Other Ambulatory Visit (INDEPENDENT_AMBULATORY_CARE_PROVIDER_SITE_OTHER): Payer: Medicare Other

## 2015-07-21 ENCOUNTER — Encounter: Payer: Self-pay | Admitting: Gastroenterology

## 2015-07-21 ENCOUNTER — Ambulatory Visit (INDEPENDENT_AMBULATORY_CARE_PROVIDER_SITE_OTHER): Payer: Medicare Other | Admitting: Gastroenterology

## 2015-07-21 VITALS — BP 134/80 | HR 84 | Ht 75.0 in | Wt 313.0 lb

## 2015-07-21 DIAGNOSIS — R195 Other fecal abnormalities: Secondary | ICD-10-CM | POA: Diagnosis not present

## 2015-07-21 DIAGNOSIS — K625 Hemorrhage of anus and rectum: Secondary | ICD-10-CM | POA: Diagnosis not present

## 2015-07-21 LAB — COMPREHENSIVE METABOLIC PANEL
ALBUMIN: 4.3 g/dL (ref 3.5–5.2)
ALK PHOS: 63 U/L (ref 39–117)
ALT: 62 U/L — AB (ref 0–53)
AST: 44 U/L — AB (ref 0–37)
BILIRUBIN TOTAL: 0.5 mg/dL (ref 0.2–1.2)
BUN: 16 mg/dL (ref 6–23)
CALCIUM: 9.3 mg/dL (ref 8.4–10.5)
CO2: 28 mEq/L (ref 19–32)
CREATININE: 0.93 mg/dL (ref 0.40–1.50)
Chloride: 100 mEq/L (ref 96–112)
GFR: 91.88 mL/min (ref >60.00)
Glucose, Bld: 81 mg/dL (ref 70–99)
Potassium: 3.8 mEq/L (ref 3.5–5.1)
Sodium: 135 mEq/L (ref 135–145)
TOTAL PROTEIN: 8.1 g/dL (ref 6.0–8.3)

## 2015-07-21 LAB — PROTIME-INR
INR: 1.2 ratio — ABNORMAL HIGH (ref 0.8–1.0)
Prothrombin Time: 12.8 s (ref 9.6–13.1)

## 2015-07-21 LAB — CBC WITH DIFFERENTIAL/PLATELET
BASOS ABS: 0 10*3/uL (ref 0.0–0.1)
Basophils Relative: 0.4 % (ref 0.0–3.0)
EOS ABS: 0.1 10*3/uL (ref 0.0–0.7)
Eosinophils Relative: 1.3 % (ref 0.0–5.0)
HEMATOCRIT: 42 % (ref 39.0–52.0)
HEMOGLOBIN: 14.6 g/dL (ref 13.0–17.0)
LYMPHS PCT: 19.9 % (ref 12.0–46.0)
Lymphs Abs: 1.9 10*3/uL (ref 0.7–4.0)
MCHC: 34.6 g/dL (ref 30.0–36.0)
MCV: 91.4 fl (ref 78.0–100.0)
MONOS PCT: 8.3 % (ref 3.0–12.0)
Monocytes Absolute: 0.8 10*3/uL (ref 0.1–1.0)
NEUTROS ABS: 6.6 10*3/uL (ref 1.4–7.7)
Neutrophils Relative %: 70.1 % (ref 43.0–77.0)
Platelets: 267 10*3/uL (ref 150.0–400.0)
RBC: 4.6 Mil/uL (ref 4.22–5.81)
RDW: 14.8 % (ref 11.5–15.5)
WBC: 9.4 10*3/uL (ref 4.0–10.5)

## 2015-07-21 NOTE — Patient Instructions (Addendum)
You will have labs checked today in the basement lab.  Please head down after you check out with the front desk  (cbc, cmet, inr). You will be set up for a colonoscopy and EGD for gi bleeding (dark stool and red stools).

## 2015-07-21 NOTE — Progress Notes (Signed)
HPI: This is a  very pleasant 49 year old man    who was referred to me by Ronnell Freshwater, NP  to evaluate  rectal bleeding .    Chief complaint is rectal bleeding  Has chronic diarrhea, underwent colonoscopy. See below. This has been for many years.  Usually will have 4-5 loose stools per day.  About 10 days ago he noticed darker stools and red blood as well.  Will drip down his leg.  Bright red blood with every BM.  Feels a bit fatigued.  Has homocystineuria.  In [place] (pre-2008) he had EGD and also a pill camera.  He had those done because of cramping, abd pains.  His weight fluctuates.       Colonoscopy 06/2008, Dr. Verl Blalock, for diarrhea. This found diverticulosis in the left colon, otherwise was normal. Random biopsies showed no microscopic colitis.    Review of systems: Pertinent positive and negative review of systems were noted in the above HPI section. Complete review of systems was performed and was otherwise normal.   Past Medical History  Diagnosis Date  . Lens disease   . Brain bleed   . Cataract     bilateral repair with lens implants  . Depression   . Anxiety   . OCD (obsessive compulsive disorder)   . Neuromuscular disorder   . Homocystinuria   . Arthritis   . Hypertension   . Umbilical hernia   . Sleep apnea   . GERD (gastroesophageal reflux disease)   . Family history of adverse reaction to anesthesia     n/v-mom  . Stroke     with brain bleed at age 89-no deficits  . Paresthesia 06/10/2015  . Obesity, Class II, BMI 35-39.9     Past Surgical History  Procedure Laterality Date  . 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 Wrist surgery Right   . Dermabrasion of face      due to acne scars  . Eye surgery      LASER + SURG BIL   . Pars plana vitrectomy Right 09/17/2013    Procedure: PARS PLANA VITRECTOMY WITH 25G REMOVAL/SUTURE SECONDARY INTRAOCULAR LENS;  Surgeon: Hayden Pedro, MD;   Location: [place];  Service: Ophthalmology;  Laterality: Right;  . Photocoagulation Right 09/17/2013    Procedure: PHOTOCOAGULATION;  Surgeon: Hayden Pedro, MD;  Location: [place];  Service: Ophthalmology;  Laterality: Right;  HEADSCOPE LASER  . Gas/fluid exchange Right 09/17/2013    Procedure: GAS/FLUID EXCHANGE;  Surgeon: Hayden Pedro, MD;  Location: [place];  Service: Ophthalmology;  Laterality: Right;  . Nasal septum surgery  march 2016    @ [facility]  . Umbilical hernia repair N/A 05/07/2015    Procedure: HERNIA REPAIR UMBILICAL ADULT;  Surgeon: Florene Glen, MD;  Location: [facility];  Service: General;  Laterality: N/A;    Current Outpatient Prescriptions  Medication Sig Dispense Refill  . ALPRAZolam (XANAX) 0.5 MG tablet Take 0.5 mg by mouth 3 (three) times daily.    Marland Kitchen amLODipine (NORVASC) 5 MG tablet Take 1 tablet (5 mg total) by mouth 2 (two) times daily. 180 tablet 1  . ARIPiprazole (ABILIFY) 10 MG tablet Take 10 mg by mouth at bedtime.    Marland Kitchen aspirin EC 81 MG tablet Take 81 mg by mouth daily.    Marland Kitchen atorvastatin (LIPITOR) 10 MG tablet Take 10 mg by mouth at bedtime.     . Betaine (CYSTADANE) POWD  Take 7 scoop by mouth 3 (three) times daily. Mix with 4-6 ounces of water For Homocysteneria    . cycloSPORINE (RESTASIS) 0.05 % ophthalmic emulsion Place 1 drop into both eyes 2 (two) times daily.    Marland Kitchen esomeprazole (NEXIUM) 20 MG capsule Take 20 mg by mouth daily at 12 noon.    . folic acid (FOLVITE) 703 MCG tablet Take 1,200 mcg by mouth daily.    . hyoscyamine (LEVSIN/SL) 0.125 MG SL tablet Place 1 tablet (0.125 mg total) under the tongue every 4 (four) hours as needed. 30 tablet 0  . Metoprolol Tartrate 75 MG TABS Take by mouth.    Marland Kitchen PARoxetine (PAXIL) 40 MG tablet Take 60 mg by mouth at bedtime.     . pregabalin (LYRICA) 100 MG capsule Take 100 mg by mouth 3 (three) times daily.     Marland Kitchen pyridoxine (B-6) 100 MG tablet Take 500 mg by mouth at bedtime.     . sodium chloride (OCEAN) 0.65 % nasal  spray Place 2 sprays into the nose 2 (two) times daily.    Marland Kitchen topiramate (TOPAMAX) 25 MG tablet Take one tablet at night for one week, then take 2 tablets at night for one week, then take 3 tablets at night. 90 tablet 3  . traZODone (DESYREL) 100 MG tablet Take 200 mg by mouth at bedtime.    . vitamin B-12 (CYANOCOBALAMIN) 1000 MCG tablet Take 1,000 mcg by mouth at bedtime.      No current facility-administered medications for this visit.    Allergies as of 07/21/2015 - Review Complete 07/21/2015  Allergen Reaction Noted  . Pineapple Swelling 05/23/2013  . Amoxicillin Nausea And Vomiting     Family History  Problem Relation Age of Onset  . Breast cancer Mother   . Stroke Other   . Stroke Maternal Grandmother   . Stroke Maternal Grandfather   . Stroke Paternal Grandmother   . Stroke Paternal Grandfather     Social History   Social History  . Marital Status: Single    Spouse Name: n/a  . Number of Children: 0  . Years of Education: college   Occupational History  . unemployed/disabled     behavioral counselor   Social History Main Topics  . Smoking status: Never Smoker   . Smokeless tobacco: Never Used  . Alcohol Use: No  . Drug Use: No  . Sexual Activity: Not on file   Other Topics Concern  . Not on file   Social History Narrative   Lives alone. Parents live nearby.  Applying for disability (OCD) due to wrist injury, hearing upcoming.      Patient drinks 4-5 cups of caffeine daily.   Patient is right handed.     Physical Exam: BP 134/80 mmHg  Pulse 84  Ht 6\' 3"  (1.905 m)  Wt 313 lb (141.976 kg)  BMI 39.12 kg/m2 Constitutional: generally well-appearing Psychiatric: alert and oriented x3 Eyes: extraocular movements intact Mouth: oral pharynx moist, no lesions Neck: supple no lymphadenopathy Cardiovascular: heart regular rate and rhythm Lungs: clear to auscultation bilaterally Abdomen: soft, nontender, nondistended, no obvious ascites, no peritoneal  signs, normal bowel sounds Extremities: no lower extremity edema bilaterally Skin: no lesions on visible extremities   Assessment and plan: 49 y.o. male with  rectal bleeding  He describes the bleeding is some red and some dark. CBC last week was normal. Not clear if this is an upper or lower GI source. He is hemodynamically stable and his blood  counts last week were normal/no think is quite as fast as he is bleeding that it is. He'll a repeat set of labs including a CBC, complete about profile and coags. I would like to proceed with colonoscopy and upper endoscopy at his soonest convenience. It looks like I have a spot available one week from today.   Owens Loffler, MD [facility] 07/21/2015, 2:09 PM  Cc: Ronnell Freshwater, NP

## 2015-07-22 ENCOUNTER — Ambulatory Visit
Admission: RE | Admit: 2015-07-22 | Discharge: 2015-07-22 | Disposition: A | Discharge status: Home / Self Care | Payer: Medicare Other | Source: Ambulatory Visit | Attending: Nurse Practitioner | Admitting: Nurse Practitioner

## 2015-07-22 ENCOUNTER — Other Ambulatory Visit: Payer: Self-pay | Admitting: Nurse Practitioner

## 2015-07-22 DIAGNOSIS — S8992XA Unspecified injury of left lower leg, initial encounter: Secondary | ICD-10-CM | POA: Diagnosis not present

## 2015-07-22 DIAGNOSIS — S8991XA Unspecified injury of right lower leg, initial encounter: Secondary | ICD-10-CM | POA: Diagnosis not present

## 2015-07-22 DIAGNOSIS — I679 Cerebrovascular disease, unspecified: Secondary | ICD-10-CM | POA: Diagnosis not present

## 2015-07-22 DIAGNOSIS — J301 Allergic rhinitis due to pollen: Secondary | ICD-10-CM | POA: Diagnosis not present

## 2015-07-22 DIAGNOSIS — M25569 Pain in unspecified knee: Secondary | ICD-10-CM | POA: Diagnosis not present

## 2015-07-22 DIAGNOSIS — M792 Neuralgia and neuritis, unspecified: Secondary | ICD-10-CM | POA: Diagnosis not present

## 2015-07-22 DIAGNOSIS — M25561 Pain in right knee: Secondary | ICD-10-CM

## 2015-07-22 DIAGNOSIS — M25562 Pain in left knee: Principal | ICD-10-CM

## 2015-07-22 DIAGNOSIS — K921 Melena: Secondary | ICD-10-CM | POA: Diagnosis not present

## 2015-07-22 DIAGNOSIS — I270 Primary pulmonary hypertension: Secondary | ICD-10-CM | POA: Diagnosis not present

## 2015-07-27 ENCOUNTER — Encounter (HOSPITAL_COMMUNITY): Payer: Self-pay | Admitting: *Deleted

## 2015-07-27 DIAGNOSIS — J301 Allergic rhinitis due to pollen: Secondary | ICD-10-CM | POA: Diagnosis not present

## 2015-07-28 ENCOUNTER — Other Ambulatory Visit: Payer: Self-pay

## 2015-07-28 ENCOUNTER — Encounter: Payer: Medicare Other | Admitting: Gastroenterology

## 2015-07-28 DIAGNOSIS — M170 Bilateral primary osteoarthritis of knee: Secondary | ICD-10-CM | POA: Diagnosis not present

## 2015-07-28 DIAGNOSIS — M1712 Unilateral primary osteoarthritis, left knee: Secondary | ICD-10-CM | POA: Diagnosis not present

## 2015-07-28 DIAGNOSIS — R7989 Other specified abnormal findings of blood chemistry: Secondary | ICD-10-CM

## 2015-07-28 DIAGNOSIS — R945 Abnormal results of liver function studies: Principal | ICD-10-CM

## 2015-07-29 NOTE — Progress Notes (Signed)
Reviewed with Dr. Landry Dyke anesthesiologist re: patient's pulmonary hypertension history no new orders okay to proceed with the procedures scheduled.

## 2015-07-30 ENCOUNTER — Telehealth: Payer: Self-pay | Admitting: Gastroenterology

## 2015-07-30 ENCOUNTER — Ambulatory Visit (HOSPITAL_COMMUNITY): Payer: Medicare Other | Admitting: Certified Registered Nurse Anesthetist

## 2015-07-30 ENCOUNTER — Ambulatory Visit (HOSPITAL_COMMUNITY)
Admission: RE | Admit: 2015-07-30 | Discharge: 2015-07-30 | Disposition: A | Discharge status: Home / Self Care | Payer: Medicare Other | Source: Ambulatory Visit | Attending: Gastroenterology | Admitting: Gastroenterology

## 2015-07-30 ENCOUNTER — Encounter (HOSPITAL_COMMUNITY): Payer: Self-pay

## 2015-07-30 ENCOUNTER — Encounter (HOSPITAL_COMMUNITY)
Admission: RE | Disposition: A | Discharge status: Home / Self Care | Payer: Self-pay | Source: Ambulatory Visit | Attending: Gastroenterology

## 2015-07-30 DIAGNOSIS — K922 Gastrointestinal hemorrhage, unspecified: Secondary | ICD-10-CM

## 2015-07-30 DIAGNOSIS — D123 Benign neoplasm of transverse colon: Secondary | ICD-10-CM | POA: Diagnosis not present

## 2015-07-30 DIAGNOSIS — F419 Anxiety disorder, unspecified: Secondary | ICD-10-CM | POA: Diagnosis not present

## 2015-07-30 DIAGNOSIS — E7211 Homocystinuria: Secondary | ICD-10-CM | POA: Insufficient documentation

## 2015-07-30 DIAGNOSIS — K5730 Diverticulosis of large intestine without perforation or abscess without bleeding: Secondary | ICD-10-CM | POA: Diagnosis not present

## 2015-07-30 DIAGNOSIS — M1990 Unspecified osteoarthritis, unspecified site: Secondary | ICD-10-CM | POA: Insufficient documentation

## 2015-07-30 DIAGNOSIS — I10 Essential (primary) hypertension: Secondary | ICD-10-CM | POA: Diagnosis not present

## 2015-07-30 DIAGNOSIS — F42 Obsessive-compulsive disorder: Secondary | ICD-10-CM | POA: Insufficient documentation

## 2015-07-30 DIAGNOSIS — G4730 Sleep apnea, unspecified: Secondary | ICD-10-CM | POA: Insufficient documentation

## 2015-07-30 DIAGNOSIS — K219 Gastro-esophageal reflux disease without esophagitis: Secondary | ICD-10-CM | POA: Insufficient documentation

## 2015-07-30 DIAGNOSIS — K529 Noninfective gastroenteritis and colitis, unspecified: Secondary | ICD-10-CM | POA: Insufficient documentation

## 2015-07-30 DIAGNOSIS — K625 Hemorrhage of anus and rectum: Secondary | ICD-10-CM | POA: Insufficient documentation

## 2015-07-30 DIAGNOSIS — Z6839 Body mass index (BMI) 39.0-39.9, adult: Secondary | ICD-10-CM | POA: Diagnosis not present

## 2015-07-30 DIAGNOSIS — Z8673 Personal history of transient ischemic attack (TIA), and cerebral infarction without residual deficits: Secondary | ICD-10-CM | POA: Diagnosis not present

## 2015-07-30 DIAGNOSIS — Z79899 Other long term (current) drug therapy: Secondary | ICD-10-CM | POA: Insufficient documentation

## 2015-07-30 DIAGNOSIS — R000 Tachycardia, unspecified: Secondary | ICD-10-CM | POA: Diagnosis not present

## 2015-07-30 DIAGNOSIS — R194 Change in bowel habit: Secondary | ICD-10-CM

## 2015-07-30 DIAGNOSIS — E668 Other obesity: Secondary | ICD-10-CM | POA: Diagnosis not present

## 2015-07-30 DIAGNOSIS — R195 Other fecal abnormalities: Secondary | ICD-10-CM

## 2015-07-30 DIAGNOSIS — F329 Major depressive disorder, single episode, unspecified: Secondary | ICD-10-CM | POA: Diagnosis not present

## 2015-07-30 DIAGNOSIS — Z7982 Long term (current) use of aspirin: Secondary | ICD-10-CM | POA: Diagnosis not present

## 2015-07-30 HISTORY — PX: COLONOSCOPY WITH PROPOFOL: SHX5780

## 2015-07-30 HISTORY — PX: ESOPHAGOGASTRODUODENOSCOPY (EGD) WITH PROPOFOL: SHX5813

## 2015-07-30 SURGERY — COLONOSCOPY WITH PROPOFOL
Anesthesia: Monitor Anesthesia Care

## 2015-07-30 MED ORDER — PROPOFOL 10 MG/ML IV BOLUS
INTRAVENOUS | Status: DC | PRN
  Administered 2015-07-30 (×4): 20 mg via INTRAVENOUS

## 2015-07-30 MED ORDER — PROPOFOL 10 MG/ML IV BOLUS
INTRAVENOUS | Status: AC
  Filled 2015-07-30: qty 20

## 2015-07-30 MED ORDER — ONDANSETRON HCL 4 MG/2ML IJ SOLN
INTRAMUSCULAR | Status: AC
Start: 2015-07-30 — End: 2015-07-30
  Filled 2015-07-30: qty 2

## 2015-07-30 MED ORDER — PROPOFOL INFUSION 10 MG/ML OPTIME
INTRAVENOUS | Status: DC | PRN
  Administered 2015-07-30: 100 ug/kg/min via INTRAVENOUS

## 2015-07-30 MED ORDER — LACTATED RINGERS IV SOLN
INTRAVENOUS | Status: DC
  Administered 2015-07-30: 1000 mL via INTRAVENOUS

## 2015-07-30 MED ORDER — ONDANSETRON HCL 4 MG/2ML IJ SOLN
INTRAMUSCULAR | Status: DC | PRN
  Administered 2015-07-30: 4 mg via INTRAVENOUS

## 2015-07-30 MED ORDER — LIDOCAINE HCL (CARDIAC) 20 MG/ML IV SOLN
INTRAVENOUS | Status: DC | PRN
  Administered 2015-07-30: 100 mg via INTRAVENOUS

## 2015-07-30 MED ORDER — SODIUM CHLORIDE 0.9 % IV SOLN: INTRAVENOUS | Status: DC

## 2015-07-30 MED ORDER — LIDOCAINE HCL (CARDIAC) 20 MG/ML IV SOLN
INTRAVENOUS | Status: AC
  Filled 2015-07-30: qty 5

## 2015-07-30 SURGICAL SUPPLY — 24 items

## 2015-07-30 NOTE — Anesthesia Preprocedure Evaluation (Addendum)
Anesthesia Evaluation  Patient identified by MRN, date of birth, ID band Patient awake    Reviewed: Allergy & Precautions, NPO status , Patient's Chart, lab work & pertinent test results, reviewed documented beta blocker date and time   Airway Mallampati: II   Neck ROM: Full   Comment: Large neck Dental  (+) Teeth Intact   Pulmonary sleep apnea and Continuous Positive Airway Pressure Ventilation ,  breath sounds clear to auscultation        Cardiovascular hypertension, Pt. on medications Rhythm:Regular Rate:Normal     Neuro/Psych PSYCHIATRIC DISORDERS Anxiety Depression    GI/Hepatic Neg liver ROS, GERD-  Medicated,  Endo/Other  negative endocrine ROS  Renal/GU negative Renal ROS  negative genitourinary   Musculoskeletal  (+) Arthritis -, Osteoarthritis,    Abdominal   Peds negative pediatric ROS (+)  Hematology negative hematology ROS (+)   Anesthesia Other Findings   Reproductive/Obstetrics                            Lab Results  Component Value Date   WBC 9.4 07/21/2015   HGB 14.6 07/21/2015   HCT 42.0 07/21/2015   MCV 91.4 07/21/2015   PLT 267.0 07/21/2015   Lab Results  Component Value Date   CREATININE 0.93 07/21/2015   BUN 16 07/21/2015   NA 135 07/21/2015   K 3.8 07/21/2015   CL 100 07/21/2015   CO2 28 07/21/2015   EKG: normal sinus rhythm.  Echo: Study Conclusions  - Left ventricle: The cavity size was normal. There was moderate focal basal hypertrophy. Systolic function was normal. The estimated ejection fraction was in the range of 60% to 65%. Images were inadequate for LV wall motion assessment. - Aorta: Aortic root dimension: 37 mm (ED). - Ascending aorta: The ascending aorta was mildly dilated. - Pulmonic valve: There was trivial regurgitation. - Pulmonary artery:  The main pulmonary artery was normal-sized. Systolic pressure was within the normal  range.  Anesthesia Physical Anesthesia Plan  ASA: III  Anesthesia Plan: MAC   Post-op Pain Management:    Induction: Intravenous  Airway Management Planned: Natural Airway and Simple Face Mask  Additional Equipment:   Intra-op Plan:   Post-operative Plan:   Informed Consent: I have reviewed the patients History and Physical, chart, labs and discussed the procedure including the risks, benefits and alternatives for the proposed anesthesia with the patient or authorized representative who has indicated his/her understanding and acceptance.   Dental advisory given  Plan Discussed with:   Anesthesia Plan Comments:         Anesthesia Quick Evaluation

## 2015-07-30 NOTE — Op Note (Signed)
[facility] [place], [address]   ENDOSCOPY PROCEDURE REPORT  PATIENT: Jesus Crosby, Jesus Crosby  MR#: [id number] BIRTHDATE: 14-Mar-1966 , 48  yrs. old GENDER: male ENDOSCOPIST: [name], MD PROCEDURE DATE:  07/30/2015 PROCEDURE:  EGD, diagnostic ASA CLASS:     Class II INDICATIONS:  GI bleeding (sometimes reddish, sometimes dark blood).  MEDICATIONS: Monitored anesthesia care TOPICAL ANESTHETIC: none  DESCRIPTION OF PROCEDURE: After the risks benefits and alternatives of the procedure were thoroughly explained, informed consent was obtained.  The Pentax Gastroscope [id number] endoscope was introduced through the mouth and advanced to the second portion of the duodenum , Without limitations.  The instrument was slowly withdrawn as the mucosa was fully examined.   EXAM: The esophagus and gastroesophageal junction were completely normal in appearance.  The stomach was entered and closely examined.The antrum, angularis, and lesser curvature were well visualized, including a retroflexed view of the cardia and fundus. The stomach wall was normally distensable.  The scope passed easily through the pylorus into the duodenum.  Retroflexed views revealed no abnormalities.     The scope was then withdrawn from the patient and the procedure completed.  COMPLICATIONS: There were no immediate complications.  ENDOSCOPIC IMPRESSION: Normal appearing esophagus and GE junction, the stomach was well visualized and normal in appearance, normal appearing duodenum  RECOMMENDATIONS: Await pathology from colonoscopy biopsies for further recommendations.   eSigned:  [name], MD 07/30/2015 8:40 AM    CC: Leretha Pol, MD

## 2015-07-30 NOTE — Interval H&P Note (Signed)
History and Physical Interval Note:  07/30/2015 7:14 AM  Jesus Crosby.  has presented today for surgery, with the diagnosis of rectal bleeding, dark stools  The various methods of treatment have been discussed with the patient and family. After consideration of risks, benefits and other options for treatment, the patient has consented to  Procedure(s): COLONOSCOPY WITH PROPOFOL (N/A) ESOPHAGOGASTRODUODENOSCOPY (EGD) WITH PROPOFOL (N/A) as a surgical intervention .  The patient's history has been reviewed, patient examined, no change in status, stable for surgery.  I have reviewed the patient's chart and labs.  Questions were answered to the patient's satisfaction.     [name]

## 2015-07-30 NOTE — Anesthesia Postprocedure Evaluation (Signed)
  Anesthesia Post-op Note  Patient: Jesus Crosby.  Procedure(s) Performed: Procedure(s): COLONOSCOPY WITH PROPOFOL (N/A) ESOPHAGOGASTRODUODENOSCOPY (EGD) WITH PROPOFOL (N/A)  Patient Location: PACU  Anesthesia Type:MAC  Level of Consciousness: awake and alert   Airway and Oxygen Therapy: Patient Spontanous Breathing  Post-op Pain: none  Post-op Assessment: Post-op Vital signs reviewed and Patient's Cardiovascular Status Stable              Post-op Vital Signs: Reviewed and stable  Last Vitals:  Filed Vitals:   07/30/15 0900  BP: 124/69  Pulse: 69  Temp:   Resp: 14    Complications: No apparent anesthesia complications

## 2015-07-30 NOTE — Telephone Encounter (Signed)
Pt wanted to confirm that his labs were slightly elevated and he needs additional labs and [place]. We spoke and all questions answered and he will keep appts.

## 2015-07-30 NOTE — Discharge Instructions (Signed)

## 2015-07-30 NOTE — H&P (View-Only) (Signed)
HPI: This is a  very pleasant 49 year old man    who was referred to me by Ronnell Freshwater, NP  to evaluate  rectal bleeding .    Chief complaint is rectal bleeding  Has chronic diarrhea, underwent colonoscopy. See below. This has been for many years.  Usually will have 4-5 loose stools per day.  About 10 days ago he noticed darker stools and red blood as well.  Will drip down his leg.  Bright red blood with every BM.  Feels a bit fatigued.  Has homocystineuria.  In [place] (pre-2008) he had EGD and also a pill camera.  He had those done because of cramping, abd pains.  His weight fluctuates.       Colonoscopy 06/2008, Dr. Verl Blalock, for diarrhea. This found diverticulosis in the left colon, otherwise was normal. Random biopsies showed no microscopic colitis.    Review of systems: Pertinent positive and negative review of systems were noted in the above HPI section. Complete review of systems was performed and was otherwise normal.   Past Medical History  Diagnosis Date  . Lens disease   . Brain bleed   . Cataract     bilateral repair with lens implants  . Depression   . Anxiety   . OCD (obsessive compulsive disorder)   . Neuromuscular disorder   . Homocystinuria   . Arthritis   . Hypertension   . Umbilical hernia   . Sleep apnea   . GERD (gastroesophageal reflux disease)   . Family history of adverse reaction to anesthesia     n/v-mom  . Stroke     with brain bleed at age 63-no deficits  . Paresthesia 06/10/2015  . Obesity, Class II, BMI 35-39.9     Past Surgical History  Procedure Laterality Date  . 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 Wrist surgery Right   . Dermabrasion of face      due to acne scars  . Eye surgery      LASER + SURG BIL   . Pars plana vitrectomy Right 09/17/2013    Procedure: PARS PLANA VITRECTOMY WITH 25G REMOVAL/SUTURE SECONDARY INTRAOCULAR LENS;  Surgeon: Hayden Pedro, MD;   Location: [place];  Service: Ophthalmology;  Laterality: Right;  . Photocoagulation Right 09/17/2013    Procedure: PHOTOCOAGULATION;  Surgeon: Hayden Pedro, MD;  Location: [place];  Service: Ophthalmology;  Laterality: Right;  HEADSCOPE LASER  . Gas/fluid exchange Right 09/17/2013    Procedure: GAS/FLUID EXCHANGE;  Surgeon: Hayden Pedro, MD;  Location: [place];  Service: Ophthalmology;  Laterality: Right;  . Nasal septum surgery  march 2016    @ [facility]  . Umbilical hernia repair N/A 05/07/2015    Procedure: HERNIA REPAIR UMBILICAL ADULT;  Surgeon: Florene Glen, MD;  Location: [facility];  Service: General;  Laterality: N/A;    Current Outpatient Prescriptions  Medication Sig Dispense Refill  . ALPRAZolam (XANAX) 0.5 MG tablet Take 0.5 mg by mouth 3 (three) times daily.    Marland Kitchen amLODipine (NORVASC) 5 MG tablet Take 1 tablet (5 mg total) by mouth 2 (two) times daily. 180 tablet 1  . ARIPiprazole (ABILIFY) 10 MG tablet Take 10 mg by mouth at bedtime.    Marland Kitchen aspirin EC 81 MG tablet Take 81 mg by mouth daily.    Marland Kitchen atorvastatin (LIPITOR) 10 MG tablet Take 10 mg by mouth at bedtime.     . Betaine (CYSTADANE) POWD  Take 7 scoop by mouth 3 (three) times daily. Mix with 4-6 ounces of water For Homocysteneria    . cycloSPORINE (RESTASIS) 0.05 % ophthalmic emulsion Place 1 drop into both eyes 2 (two) times daily.    Marland Kitchen esomeprazole (NEXIUM) 20 MG capsule Take 20 mg by mouth daily at 12 noon.    . folic acid (FOLVITE) 782 MCG tablet Take 1,200 mcg by mouth daily.    . hyoscyamine (LEVSIN/SL) 0.125 MG SL tablet Place 1 tablet (0.125 mg total) under the tongue every 4 (four) hours as needed. 30 tablet 0  . Metoprolol Tartrate 75 MG TABS Take by mouth.    Marland Kitchen PARoxetine (PAXIL) 40 MG tablet Take 60 mg by mouth at bedtime.     . pregabalin (LYRICA) 100 MG capsule Take 100 mg by mouth 3 (three) times daily.     Marland Kitchen pyridoxine (B-6) 100 MG tablet Take 500 mg by mouth at bedtime.     . sodium chloride (OCEAN) 0.65 % nasal  spray Place 2 sprays into the nose 2 (two) times daily.    Marland Kitchen topiramate (TOPAMAX) 25 MG tablet Take one tablet at night for one week, then take 2 tablets at night for one week, then take 3 tablets at night. 90 tablet 3  . traZODone (DESYREL) 100 MG tablet Take 200 mg by mouth at bedtime.    . vitamin B-12 (CYANOCOBALAMIN) 1000 MCG tablet Take 1,000 mcg by mouth at bedtime.      No current facility-administered medications for this visit.    Allergies as of 07/21/2015 - Review Complete 07/21/2015  Allergen Reaction Noted  . Pineapple Swelling 05/23/2013  . Amoxicillin Nausea And Vomiting     Family History  Problem Relation Age of Onset  . Breast cancer Mother   . Stroke Other   . Stroke Maternal Grandmother   . Stroke Maternal Grandfather   . Stroke Paternal Grandmother   . Stroke Paternal Grandfather     Social History   Social History  . Marital Status: Single    Spouse Name: n/a  . Number of Children: 0  . Years of Education: college   Occupational History  . unemployed/disabled     behavioral counselor   Social History Main Topics  . Smoking status: Never Smoker   . Smokeless tobacco: Never Used  . Alcohol Use: No  . Drug Use: No  . Sexual Activity: Not on file   Other Topics Concern  . Not on file   Social History Narrative   Lives alone. Parents live nearby.  Applying for disability (OCD) due to wrist injury, hearing upcoming.      Patient drinks 4-5 cups of caffeine daily.   Patient is right handed.     Physical Exam: BP 134/80 mmHg  Pulse 84  Ht 6\' 3"  (1.905 m)  Wt 313 lb (141.976 kg)  BMI 39.12 kg/m2 Constitutional: generally well-appearing Psychiatric: alert and oriented x3 Eyes: extraocular movements intact Mouth: oral pharynx moist, no lesions Neck: supple no lymphadenopathy Cardiovascular: heart regular rate and rhythm Lungs: clear to auscultation bilaterally Abdomen: soft, nontender, nondistended, no obvious ascites, no peritoneal  signs, normal bowel sounds Extremities: no lower extremity edema bilaterally Skin: no lesions on visible extremities   Assessment and plan: 49 y.o. male with  rectal bleeding  He describes the bleeding is some red and some dark. CBC last week was normal. Not clear if this is an upper or lower GI source. He is hemodynamically stable and his blood  counts last week were normal/no think is quite as fast as he is bleeding that it is. He'll a repeat set of labs including a CBC, complete about profile and coags. I would like to proceed with colonoscopy and upper endoscopy at his soonest convenience. It looks like I have a spot available one week from today.   Owens Loffler, MD [facility] 07/21/2015, 2:09 PM  Cc: Ronnell Freshwater, NP

## 2015-07-30 NOTE — Op Note (Signed)
[facility] [place], [address]   COLONOSCOPY PROCEDURE REPORT  PATIENT: Jesus, Crosby  MR#: [id number] BIRTHDATE: 03-17-1966 , 48  yrs. old GENDER: male ENDOSCOPIST: [name], MD REFERRED BY: Leretha Pol, MD PROCEDURE DATE:  07/30/2015 PROCEDURE:   Colonoscopy, diagnostic First Screening Colonoscopy - Avg.  risk and is 50 yrs.  old or older - No.  Prior Negative Screening - Now for repeat screening. N/A  History of Adenoma - Now for follow-up colonoscopy & has been > or = to 3 yrs.  N/A  Polyps removed today? Yes ASA CLASS:   Class II INDICATIONS:change in bowels, mild intermittent rectal bleeding (not anemic). MEDICATIONS: Monitored anesthesia care  DESCRIPTION OF PROCEDURE:   After the risks benefits and alternatives of the procedure were thoroughly explained, informed consent was obtained.  The digital rectal exam revealed no abnormalities of the rectum.   The Pentax Adult Colonscope [id number] endoscope was introduced through the anus and advanced to the terminal ileum which was intubated for a short distance. No adverse events experienced.   The quality of the prep was good.  The instrument was then slowly withdrawn as the colon was fully examined. Estimated blood loss is zero unless otherwise noted in this procedure report.  COLON FINDINGS: There was mild, non-specific inflammation (colitis) from the anus to about 50-60cm.  There was NOT a discrete transtition to normal mucosa however the right colon was normal appearing.  The terminal ileum was normal.  The left colon was biopsied and sent to pathology.  One polyp was found, removed and sent to pathology.  This was sessile, located in transverse segement, removed with cold snare, sent to pathology.  The examination was otherwise normal.  Retroflexed views revealed no abnormalities. The time to cecum = 2 min      Withdrawal time = 9 min  The scope was withdrawn and the procedure  completed. COMPLICATIONS: There were no immediate complications.  ENDOSCOPIC IMPRESSION: There was mild, non-specific inflammation (colitis) from the anus to about 50-60cm.  There was NOT a discrete transtition to normal mucosa however the right colon was normal appearing.  The terminal ileum was normal.  The left colon was biopsied and sent to pathology.  One polyp was found, removed and sent to pathology. This was sessile, located in transverse segement, removed with cold snare, sent to pathology.  The examination was otherwise normal  RECOMMENDATIONS: Await final pathology.  If chronic, active colitis is noted then will likely start oral +/- rectal mesalamine.  eSigned:  [name], MD 07/30/2015 8:38 AM

## 2015-07-30 NOTE — Transfer of Care (Signed)
Immediate Anesthesia Transfer of Care Note  Patient: Jesus Crosby.  Procedure(s) Performed: Procedure(s): COLONOSCOPY WITH PROPOFOL (N/A) ESOPHAGOGASTRODUODENOSCOPY (EGD) WITH PROPOFOL (N/A)  Patient Location: ENDO  Anesthesia Type:MAC  Level of Consciousness:  sedated, patient cooperative and responds to stimulation  Airway & Oxygen Therapy:Patient Spontanous Breathing and Patient connected to face mask oxgen  Post-op Assessment:  Report given to ENDO RN and Post -op Vital signs reviewed and stable  Post vital signs:  Reviewed and stable  Last Vitals:  Filed Vitals:   07/30/15 0836  BP:   Pulse: 70  Temp: 36.4 C  Resp: 11    Complications: No apparent anesthesia complications

## 2015-07-31 ENCOUNTER — Encounter (HOSPITAL_COMMUNITY): Payer: Self-pay | Admitting: Gastroenterology

## 2015-08-03 ENCOUNTER — Other Ambulatory Visit: Payer: Medicare Other

## 2015-08-03 ENCOUNTER — Ambulatory Visit (HOSPITAL_COMMUNITY)
Admission: RE | Admit: 2015-08-03 | Discharge: 2015-08-03 | Disposition: A | Discharge status: Home / Self Care | Payer: Medicare Other | Source: Ambulatory Visit | Attending: Gastroenterology | Admitting: Gastroenterology

## 2015-08-03 ENCOUNTER — Other Ambulatory Visit (INDEPENDENT_AMBULATORY_CARE_PROVIDER_SITE_OTHER): Payer: Medicare Other

## 2015-08-03 DIAGNOSIS — R7989 Other specified abnormal findings of blood chemistry: Secondary | ICD-10-CM | POA: Diagnosis not present

## 2015-08-03 DIAGNOSIS — K760 Fatty (change of) liver, not elsewhere classified: Secondary | ICD-10-CM | POA: Insufficient documentation

## 2015-08-03 DIAGNOSIS — R945 Abnormal results of liver function studies: Secondary | ICD-10-CM

## 2015-08-03 DIAGNOSIS — K7689 Other specified diseases of liver: Secondary | ICD-10-CM | POA: Insufficient documentation

## 2015-08-03 DIAGNOSIS — R799 Abnormal finding of blood chemistry, unspecified: Secondary | ICD-10-CM | POA: Diagnosis not present

## 2015-08-03 DIAGNOSIS — R790 Abnormal level of blood mineral: Secondary | ICD-10-CM

## 2015-08-03 LAB — FERRITIN: Ferritin: 97.6 ng/mL (ref 22.0–322.0)

## 2015-08-03 LAB — IBC PANEL
Iron: 124 ug/dL (ref 42–165)
Saturation Ratios: 28.7 % (ref 20.0–50.0)
Transferrin: 309 mg/dL (ref 212.0–360.0)

## 2015-08-04 ENCOUNTER — Telehealth: Payer: Self-pay | Admitting: Gastroenterology

## 2015-08-04 DIAGNOSIS — J301 Allergic rhinitis due to pollen: Secondary | ICD-10-CM | POA: Diagnosis not present

## 2015-08-04 LAB — HEPATITIS C ANTIBODY: HCV Ab: NEGATIVE

## 2015-08-04 LAB — TISSUE TRANSGLUTAMINASE, IGA: TISSUE TRANSGLUTAMINASE AB, IGA: 1 U/mL (ref <4)

## 2015-08-04 LAB — HEPATITIS B SURFACE ANTIBODY,QUALITATIVE: Hep B S Ab: POSITIVE — AB

## 2015-08-04 LAB — IGA: IgA: 512 mg/dL — ABNORMAL HIGH (ref 68–379)

## 2015-08-04 LAB — HEPATITIS B SURFACE ANTIGEN: HEP B S AG: NEGATIVE

## 2015-08-04 LAB — ANA: Anti Nuclear Antibody(ANA): NEGATIVE

## 2015-08-04 LAB — HEPATITIS A ANTIBODY, TOTAL: Hep A Total Ab: NONREACTIVE

## 2015-08-04 NOTE — Telephone Encounter (Signed)
Please call the patient. Biopsies and polyp removal from colonoscopy last week. Polyp was typical adenomatous polyp, needs recall in 5 years. The biopsies showed no clear inflammation, he should start one imodium on a daily basis (scheduled) and rov in 6-7 weeks to discuss his response. thanks

## 2015-08-04 NOTE — Telephone Encounter (Signed)
Pt has been notified and recall in EPIC, pt aware the path and the labs will be called to him as soon as available.  ROV also has been scheduled.

## 2015-08-05 ENCOUNTER — Telehealth: Payer: Self-pay | Admitting: Gastroenterology

## 2015-08-05 LAB — ALPHA-1-ANTITRYPSIN: A-1 Antitrypsin, Ser: 159 mg/dL (ref 83–199)

## 2015-08-05 LAB — MITOCHONDRIAL ANTIBODIES: MITOCHONDRIAL M2 AB, IGG: 0.19 (ref <0.91)

## 2015-08-05 LAB — CERULOPLASMIN: CERULOPLASMIN: 20 mg/dL (ref 18–36)

## 2015-08-05 NOTE — Telephone Encounter (Signed)
Pt calling for [place] and lab results. Please advise.

## 2015-08-06 ENCOUNTER — Telehealth: Payer: Self-pay | Admitting: Gastroenterology

## 2015-08-06 DIAGNOSIS — K760 Fatty (change of) liver, not elsewhere classified: Secondary | ICD-10-CM

## 2015-08-06 NOTE — Telephone Encounter (Signed)
Pt aware and referral made, labs in [facility] for 6 months LFT and follow needed

## 2015-08-06 NOTE — Telephone Encounter (Signed)
See result note in chart.  

## 2015-08-06 NOTE — Telephone Encounter (Signed)
Jesus Crosby, please call him. The US shows fatty liver (no cancer or bile duct problems). His labs are almost all back (only one test pending). So far the blood work shows no clear cause of her LFT elevation. He has BMI 39 and so very most likely the LFT elevation is from fatty liver. The best thing he could do is try to lose weight. Ask him if he would like referral to dietician to help with long term weight loss planning. Also, I would like repeat LFTs in 6 months with ROV shortly afterwards.

## 2015-08-11 DIAGNOSIS — J301 Allergic rhinitis due to pollen: Secondary | ICD-10-CM | POA: Diagnosis not present

## 2015-08-11 LAB — ANTI-SMOOTH MUSCLE ANTIBODY, IGG: SMOOTH MUSCLE AB: 21 U — AB (ref <20)

## 2015-08-18 DIAGNOSIS — J301 Allergic rhinitis due to pollen: Secondary | ICD-10-CM | POA: Diagnosis not present

## 2015-08-20 ENCOUNTER — Ambulatory Visit: Payer: Medicaid Other | Admitting: Gastroenterology

## 2015-08-24 DIAGNOSIS — J301 Allergic rhinitis due to pollen: Secondary | ICD-10-CM | POA: Diagnosis not present

## 2015-08-25 DIAGNOSIS — M170 Bilateral primary osteoarthritis of knee: Secondary | ICD-10-CM | POA: Diagnosis not present

## 2015-08-26 NOTE — Telephone Encounter (Signed)
error 

## 2015-08-31 DIAGNOSIS — J301 Allergic rhinitis due to pollen: Secondary | ICD-10-CM | POA: Diagnosis not present

## 2015-09-02 DIAGNOSIS — E7211 Homocystinuria: Secondary | ICD-10-CM | POA: Diagnosis not present

## 2015-09-02 DIAGNOSIS — Z79899 Other long term (current) drug therapy: Secondary | ICD-10-CM | POA: Diagnosis not present

## 2015-09-02 DIAGNOSIS — Z23 Encounter for immunization: Secondary | ICD-10-CM | POA: Diagnosis not present

## 2015-09-07 DIAGNOSIS — J301 Allergic rhinitis due to pollen: Secondary | ICD-10-CM | POA: Diagnosis not present

## 2015-09-14 ENCOUNTER — Ambulatory Visit: Payer: Self-pay | Admitting: Neurology

## 2015-09-14 DIAGNOSIS — J301 Allergic rhinitis due to pollen: Secondary | ICD-10-CM | POA: Diagnosis not present

## 2015-09-21 DIAGNOSIS — J301 Allergic rhinitis due to pollen: Secondary | ICD-10-CM | POA: Diagnosis not present

## 2015-09-23 DIAGNOSIS — E7211 Homocystinuria: Secondary | ICD-10-CM | POA: Diagnosis not present

## 2015-09-28 DIAGNOSIS — J301 Allergic rhinitis due to pollen: Secondary | ICD-10-CM | POA: Diagnosis not present

## 2015-09-29 ENCOUNTER — Ambulatory Visit: Payer: Medicare Other | Admitting: Gastroenterology

## 2015-09-30 DIAGNOSIS — G4733 Obstructive sleep apnea (adult) (pediatric): Secondary | ICD-10-CM | POA: Diagnosis not present

## 2015-10-01 DIAGNOSIS — G4733 Obstructive sleep apnea (adult) (pediatric): Secondary | ICD-10-CM | POA: Diagnosis not present

## 2015-10-01 DIAGNOSIS — E669 Obesity, unspecified: Secondary | ICD-10-CM | POA: Diagnosis not present

## 2015-10-01 DIAGNOSIS — J3089 Other allergic rhinitis: Secondary | ICD-10-CM | POA: Diagnosis not present

## 2015-10-01 DIAGNOSIS — J301 Allergic rhinitis due to pollen: Secondary | ICD-10-CM | POA: Diagnosis not present

## 2015-10-05 DIAGNOSIS — J301 Allergic rhinitis due to pollen: Secondary | ICD-10-CM | POA: Diagnosis not present

## 2015-10-12 DIAGNOSIS — J301 Allergic rhinitis due to pollen: Secondary | ICD-10-CM | POA: Diagnosis not present

## 2015-10-21 DIAGNOSIS — J301 Allergic rhinitis due to pollen: Secondary | ICD-10-CM | POA: Diagnosis not present

## 2015-10-26 DIAGNOSIS — J301 Allergic rhinitis due to pollen: Secondary | ICD-10-CM | POA: Diagnosis not present

## 2015-11-03 DIAGNOSIS — J301 Allergic rhinitis due to pollen: Secondary | ICD-10-CM | POA: Diagnosis not present

## 2015-11-11 DIAGNOSIS — J301 Allergic rhinitis due to pollen: Secondary | ICD-10-CM | POA: Diagnosis not present

## 2015-11-16 ENCOUNTER — Emergency Department
Admission: EM | Admit: 2015-11-16 | Discharge: 2015-11-16 | Disposition: A | Discharge status: Home / Self Care | Payer: Medicare Other | Attending: Emergency Medicine | Admitting: Emergency Medicine

## 2015-11-16 ENCOUNTER — Encounter: Payer: Self-pay | Admitting: Emergency Medicine

## 2015-11-16 ENCOUNTER — Emergency Department: Payer: Medicare Other

## 2015-11-16 DIAGNOSIS — Z880 Allergy status to penicillin: Secondary | ICD-10-CM | POA: Insufficient documentation

## 2015-11-16 DIAGNOSIS — I10 Essential (primary) hypertension: Secondary | ICD-10-CM | POA: Insufficient documentation

## 2015-11-16 DIAGNOSIS — R079 Chest pain, unspecified: Secondary | ICD-10-CM

## 2015-11-16 LAB — BASIC METABOLIC PANEL
ANION GAP: 6 (ref 5–15)
BUN: 10 mg/dL (ref 6–20)
CALCIUM: 9.2 mg/dL (ref 8.9–10.3)
CO2: 30 mmol/L (ref 22–32)
Chloride: 101 mmol/L (ref 101–111)
Creatinine, Ser: 1.12 mg/dL (ref 0.61–1.24)
GFR calc non Af Amer: >60 mL/min (ref >60)
Glucose, Bld: 87 mg/dL (ref 65–99)
POTASSIUM: 4 mmol/L (ref 3.5–5.1)
Sodium: 137 mmol/L (ref 135–145)

## 2015-11-16 LAB — CBC
HEMATOCRIT: 44.4 % (ref 40.0–52.0)
HEMOGLOBIN: 14.8 g/dL (ref 13.0–18.0)
MCH: 29.9 pg (ref 26.0–34.0)
MCHC: 33.4 g/dL (ref 32.0–36.0)
MCV: 89.6 fL (ref 80.0–100.0)
Platelets: 224 10*3/uL (ref 150–440)
RBC: 4.95 MIL/uL (ref 4.40–5.90)
RDW: 13.8 % (ref 11.5–14.5)
WBC: 8.5 10*3/uL (ref 3.8–10.6)

## 2015-11-16 LAB — TROPONIN I

## 2015-11-16 LAB — FIBRIN DERIVATIVES D-DIMER (ARMC ONLY): FIBRIN DERIVATIVES D-DIMER (ARMC): 367 (ref 0–499)

## 2015-11-16 NOTE — Discharge Instructions (Signed)
Nonspecific Chest Pain  °Chest pain can be caused by many different conditions. There is always a chance that your pain could be related to something serious, such as a heart attack or a blood clot in your lungs. Chest pain can also be caused by conditions that are not life-threatening. If you have chest pain, it is very important to follow up with your health care provider. °CAUSES  °Chest pain can be caused by: °· Heartburn. °· Pneumonia or bronchitis. °· Anxiety or stress. °· Inflammation around your heart (pericarditis) or lung (pleuritis or pleurisy). °· A blood clot in your lung. °· A collapsed lung (pneumothorax). It can develop suddenly on its own (spontaneous pneumothorax) or from trauma to the chest. °· Shingles infection (varicella-zoster virus). °· Heart attack. °· Damage to the bones, muscles, and cartilage that make up your chest wall. This can include: °¨ Bruised bones due to injury. °¨ Strained muscles or cartilage due to frequent or repeated coughing or overwork. °¨ Fracture to one or more ribs. °¨ Sore cartilage due to inflammation (costochondritis). °RISK FACTORS  °Risk factors for chest pain may include: °· Activities that increase your risk for trauma or injury to your chest. °· Respiratory infections or conditions that cause frequent coughing. °· Medical conditions or overeating that can cause heartburn. °· Heart disease or family history of heart disease. °· Conditions or health behaviors that increase your risk of developing a blood clot. °· Having had chicken pox (varicella zoster). °SIGNS AND SYMPTOMS °Chest pain can feel like: °· Burning or tingling on the surface of your chest or deep in your chest. °· Crushing, pressure, aching, or squeezing pain. °· Dull or sharp pain that is worse when you move, cough, or take a deep breath. °· Pain that is also felt in your back, neck, shoulder, or arm, or pain that spreads to any of these areas. °Your chest pain may come and go, or it may stay  constant. °DIAGNOSIS °Lab tests or other studies may be needed to find the cause of your pain. Your health care provider may have you take a test called an ambulatory ECG (electrocardiogram). An ECG records your heartbeat patterns at the time the test is performed. You may also have other tests, such as: °· Transthoracic echocardiogram (TTE). During echocardiography, sound waves are used to create a picture of all of the heart structures and to look at how blood flows through your heart. °· Transesophageal echocardiogram (TEE). This is a more advanced imaging test that obtains images from inside your body. It allows your health care provider to see your heart in finer detail. °· Cardiac monitoring. This allows your health care provider to monitor your heart rate and rhythm in real time. °· Holter monitor. This is a portable device that records your heartbeat and can help to diagnose abnormal heartbeats. It allows your health care provider to track your heart activity for several days, if needed. °· Stress tests. These can be done through exercise or by taking medicine that makes your heart beat more quickly. °· Blood tests. °· Imaging tests. °TREATMENT  °Your treatment depends on what is causing your chest pain. Treatment may include: °· Medicines. These may include: °¨ Acid blockers for heartburn. °¨ Anti-inflammatory medicine. °¨ Pain medicine for inflammatory conditions. °¨ Antibiotic medicine, if an infection is present. °¨ Medicines to dissolve blood clots. °¨ Medicines to treat coronary artery disease. °· Supportive care for conditions that do not require medicines. This may include: °¨ Resting. °¨ Applying heat   or cold packs to injured areas. °¨ Limiting activities until pain decreases. °HOME CARE INSTRUCTIONS °· If you were prescribed an antibiotic medicine, finish it all even if you start to feel better. °· Avoid any activities that bring on chest pain. °· Do not use any tobacco products, including  cigarettes, chewing tobacco, or electronic cigarettes. If you need help quitting, ask your health care provider. °· Do not drink alcohol. °· Take medicines only as directed by your health care provider. °· Keep all follow-up visits as directed by your health care provider. This is important. This includes any further testing if your chest pain does not go away. °· If heartburn is the cause for your chest pain, you may be told to keep your head raised (elevated) while sleeping. This reduces the chance that acid will go from your stomach into your esophagus. °· Make lifestyle changes as directed by your health care provider. These may include: °¨ Getting regular exercise. Ask your health care provider to suggest some activities that are safe for you. °¨ Eating a heart-healthy diet. A registered dietitian can help you to learn healthy eating options. °¨ Maintaining a healthy weight. °¨ Managing diabetes, if necessary. °¨ Reducing stress. °SEEK MEDICAL CARE IF: °· Your chest pain does not go away after treatment. °· You have a rash with blisters on your chest. °· You have a fever. °SEEK IMMEDIATE MEDICAL CARE IF:  °· Your chest pain is worse. °· You have an increasing cough, or you cough up blood. °· You have severe abdominal pain. °· You have severe weakness. °· You faint. °· You have chills. °· You have sudden, unexplained chest discomfort. °· You have sudden, unexplained discomfort in your arms, back, neck, or jaw. °· You have shortness of breath at any time. °· You suddenly start to sweat, or your skin gets clammy. °· You feel nauseous or you vomit. °· You suddenly feel light-headed or dizzy. °· Your heart begins to beat quickly, or it feels like it is skipping beats. °These symptoms may represent a serious problem that is an emergency. Do not wait to see if the symptoms will go away. Get medical help right away. Call your local emergency services (911 in the [place] Do not drive yourself to the hospital. °  °This  information is not intended to replace advice given to you by your health care provider. Make sure you discuss any questions you have with your health care provider. °  °Document Released: 08/31/2005 Document Revised: 12/12/2014 Document Reviewed: 06/27/2014 °Elsevier Interactive Patient Education ©2016 [employer] ° °

## 2015-11-16 NOTE — ED Notes (Signed)
Pt states he has chest pain on the left side that was sharp and stabbing that started this morning. Pt currently takes aspirin. Pt states he had left arm tingling when this happened. Pt is alert and oriented X4.

## 2015-11-16 NOTE — ED Provider Notes (Signed)
[facility] Emergency Department Provider Note     Time seen: ----------------------------------------- 4:42 PM on 11/16/2015 -----------------------------------------    I have reviewed the triage vital signs and the nursing notes.   HISTORY  Chief Complaint Chest Pain    HPI Jesus Crosby. is a 49 y.o. male who presents ER for chest pain left-sided sharp as well as dull. Patient states started this morning, he currently takes aspirin, states he has some left arm tingling with this occurred. Nothing makes it better or worse, does have high blood pressure and cholesterol. Patient also has had a stroke in the past.   Past Medical History  Diagnosis Date  . Lens disease   . Brain bleed   . Cataract     bilateral repair with lens implants  . Depression   . Anxiety   . OCD (obsessive compulsive disorder)   . Homocystinuria   . Hypertension   . Umbilical hernia   . Sleep apnea   . GERD (gastroesophageal reflux disease)   . Stroke     with brain bleed at age 13-no deficits  . Paresthesia 06/10/2015  . Obesity, Class II, BMI 35-39.9   . Neuromuscular disorder   . Arthritis     knees,Right wrist  . Family history of adverse reaction to anesthesia     n/v-mom    Patient Active Problem List   Diagnosis Date Noted  . Paresthesia 06/10/2015  . Diplopia 08/15/2014  . Sinus tachycardia ([place]) 08/15/2014  . CVA (cerebral infarction) 08/14/2014  . Intraocular lens dislocation 09/12/2013  . DDD (degenerative disc disease), lumbosacral 07/04/2013  . HTN (hypertension) 07/04/2013  . 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 HYPERLIPIDEMIA 05/28/2008  . ANXIETY 05/28/2008  . OBSESSIVE-COMPULSIVE DISORDER 05/28/2008    Past Surgical History  Procedure Laterality Date  . 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 Wrist surgery Right   . Dermabrasion of face      due to acne scars  .  Eye surgery      LASER + SURG BIL   . Pars plana vitrectomy Right 09/17/2013    Procedure: PARS PLANA VITRECTOMY WITH 25G REMOVAL/SUTURE SECONDARY INTRAOCULAR LENS;  Surgeon: Hayden Pedro, MD;  Location: [place];  Service: Ophthalmology;  Laterality: Right;  . Photocoagulation Right 09/17/2013    Procedure: PHOTOCOAGULATION;  Surgeon: Hayden Pedro, MD;  Location: [place];  Service: Ophthalmology;  Laterality: Right;  HEADSCOPE LASER  . Gas/fluid exchange Right 09/17/2013    Procedure: GAS/FLUID EXCHANGE;  Surgeon: Hayden Pedro, MD;  Location: [place];  Service: Ophthalmology;  Laterality: Right;  . Nasal septum surgery  march 2016    @ [facility]  . Umbilical hernia repair N/A 05/07/2015    Procedure: HERNIA REPAIR UMBILICAL ADULT;  Surgeon: Florene Glen, MD;  Location: [facility];  Service: General;  Laterality: N/A;  . Colonoscopy with propofol N/A 07/30/2015    Procedure: COLONOSCOPY WITH PROPOFOL;  Surgeon: [name], MD;  Location: [facility];  Service: Endoscopy;  Laterality: N/A;  . Esophagogastroduodenoscopy (egd) with propofol N/A 07/30/2015    Procedure: ESOPHAGOGASTRODUODENOSCOPY (EGD) WITH PROPOFOL;  Surgeon: [name], MD;  [facility];  Service: Endoscopy;  Laterality: N/A;    Allergies Pineapple and Amoxicillin  Social History Social History  Substance Use Topics  . Smoking status: Never Smoker   . Smokeless tobacco: Never Used  . Alcohol Use: No  Review of Systems Constitutional: Negative for fever. Eyes: Negative for visual changes. ENT: Negative for sore throat. Cardiovascular: Positive for chest pain Respiratory: Negative for shortness of breath. Gastrointestinal: Negative for abdominal pain, vomiting and diarrhea. Genitourinary: Negative for dysuria. Musculoskeletal: Negative for back pain. Skin: Negative for rash. Neurological: Negative for headaches, focal weakness or numbness.  10-point ROS otherwise  negative.  ____________________________________________   PHYSICAL EXAM:  VITAL SIGNS: ED Triage Vitals  Enc Vitals Group     BP 11/16/15 1445 133/79 mmHg     Pulse Rate 11/16/15 1445 82     Resp 11/16/15 1445 18     Temp 11/16/15 1445 98 F (36.7 C)     Temp Source 11/16/15 1445 Oral     SpO2 11/16/15 1445 100 %     Weight 11/16/15 1445 315 lb (142.883 kg)     Height 11/16/15 1445 6\' 3"  (1.905 m)     Head Cir --      Peak Flow --      Pain Score --      Pain Loc --      Pain Edu? --      Excl. in [place]? --     Constitutional: Alert and oriented. Well appearing and in no distress. Eyes: Conjunctivae are normal. PERRL. Normal extraocular movements. ENT   Head: Normocephalic and atraumatic.   Nose: No congestion/rhinnorhea.   Mouth/Throat: Mucous membranes are moist.   Neck: No stridor. Cardiovascular: Normal rate, regular rhythm. Normal and symmetric distal pulses are present in all extremities. No murmurs, rubs, or gallops. Respiratory: Normal respiratory effort without tachypnea nor retractions. Breath sounds are clear and equal bilaterally. No wheezes/rales/rhonchi. Gastrointestinal: Soft and nontender. No distention. No abdominal bruits.  Musculoskeletal: Nontender with normal range of motion in all extremities. No joint effusions.  No lower extremity tenderness nor edema. Neurologic:  Normal speech and language. No gross focal neurologic deficits are appreciated. Speech is normal.  Skin:  Skin is warm, dry and intact. No rash noted. Psychiatric: Mood and affect are normal. Speech and behavior are normal. Patient exhibits appropriate insight and judgment. ____________________________________________  EKG: Interpreted by me. Normal sinus rhythm with a rate of 80 bpm, normal PR interval, normal QS with, normal QT interval. Repeat EKG shows sinus rhythm with a rate of 79 bpm, normal PR interval, normal QRS with, normal QT interval. No focal abnormalities are  noted. ____________________________________________  ED COURSE:  Pertinent labs & imaging results that were available during my care of the patient were reviewed by me and considered in my medical decision making (see chart for details). Patient is in no acute distress, will check cardiac labs and reevaluate. ____________________________________________    LABS (pertinent positives/negatives)  Labs Reviewed  BASIC METABOLIC PANEL  CBC  TROPONIN I  FIBRIN DERIVATIVES D-DIMER ([facility] ONLY)    RADIOLOGY  Chest x-ray  IMPRESSION: No acute disease. No change since the prior study.  ____________________________________________  FINAL ASSESSMENT AND PLAN  Chest pain  Plan: Patient with labs and imaging as dictated above. Patient is in no acute distress, cardiac labs are unremarkable. Patient also has a negative d-dimer. He is stable for outpatient follow-up with his doctor.   Earleen Newport, MD   Earleen Newport, MD 11/16/15 [phone]

## 2015-11-17 DIAGNOSIS — J301 Allergic rhinitis due to pollen: Secondary | ICD-10-CM | POA: Diagnosis not present

## 2015-11-23 DIAGNOSIS — J301 Allergic rhinitis due to pollen: Secondary | ICD-10-CM | POA: Diagnosis not present

## 2015-12-01 DIAGNOSIS — J301 Allergic rhinitis due to pollen: Secondary | ICD-10-CM | POA: Diagnosis not present

## 2015-12-21 ENCOUNTER — Encounter: Payer: Self-pay | Admitting: *Deleted

## 2015-12-21 ENCOUNTER — Emergency Department
Admission: EM | Admit: 2015-12-21 | Discharge: 2015-12-21 | Disposition: A | Discharge status: Home / Self Care | Payer: Medicare Other | Attending: Emergency Medicine | Admitting: Emergency Medicine

## 2015-12-21 DIAGNOSIS — Z7982 Long term (current) use of aspirin: Secondary | ICD-10-CM | POA: Diagnosis not present

## 2015-12-21 DIAGNOSIS — Y998 Other external cause status: Secondary | ICD-10-CM | POA: Insufficient documentation

## 2015-12-21 DIAGNOSIS — T310 Burns involving less than 10% of body surface: Secondary | ICD-10-CM | POA: Diagnosis not present

## 2015-12-21 DIAGNOSIS — Z792 Long term (current) use of antibiotics: Secondary | ICD-10-CM | POA: Diagnosis not present

## 2015-12-21 DIAGNOSIS — Y9289 Other specified places as the place of occurrence of the external cause: Secondary | ICD-10-CM | POA: Diagnosis not present

## 2015-12-21 DIAGNOSIS — Z79899 Other long term (current) drug therapy: Secondary | ICD-10-CM | POA: Diagnosis not present

## 2015-12-21 DIAGNOSIS — T25222A Burn of second degree of left foot, initial encounter: Secondary | ICD-10-CM | POA: Diagnosis not present

## 2015-12-21 DIAGNOSIS — Z880 Allergy status to penicillin: Secondary | ICD-10-CM | POA: Insufficient documentation

## 2015-12-21 DIAGNOSIS — Y9389 Activity, other specified: Secondary | ICD-10-CM | POA: Insufficient documentation

## 2015-12-21 DIAGNOSIS — X100XXA Contact with hot drinks, initial encounter: Secondary | ICD-10-CM | POA: Insufficient documentation

## 2015-12-21 DIAGNOSIS — I10 Essential (primary) hypertension: Secondary | ICD-10-CM | POA: Diagnosis not present

## 2015-12-21 DIAGNOSIS — T25022A Burn of unspecified degree of left foot, initial encounter: Secondary | ICD-10-CM | POA: Diagnosis present

## 2015-12-21 MED ORDER — CLINDAMYCIN HCL 150 MG PO CAPS
300.0000 mg | ORAL_CAPSULE | Freq: Once | ORAL | Status: AC
  Administered 2015-12-21: 300 mg via ORAL
  Filled 2015-12-21: qty 2

## 2015-12-21 MED ORDER — SILVER SULFADIAZINE 1 % EX CREA: TOPICAL_CREAM | CUTANEOUS | Status: DC

## 2015-12-21 MED ORDER — SILVER SULFADIAZINE 1 % EX CREA
TOPICAL_CREAM | Freq: Once | CUTANEOUS | Status: AC
  Administered 2015-12-21: 16:00:00 via TOPICAL
  Filled 2015-12-21: qty 85

## 2015-12-21 MED ORDER — CLINDAMYCIN HCL 300 MG PO CAPS: 300.0000 mg | ORAL_CAPSULE | Freq: Four times a day (QID) | ORAL | Status: DC

## 2015-12-21 NOTE — ED Provider Notes (Signed)
[facility] Emergency Department Provider Note ____________________________________________  Time seen: 1515  I have reviewed the triage vital signs and the nursing notes.  HISTORY  Chief Complaint  Foot Burn  HPI Jesus Crosby. is a 50 y.o. male presents to the ED for evaluation of a left foot burn was sustained on Tuesday, 6 days prior to arrival. He describes that he accidentally tipped over a cup of hot soup. He had just removed it from the microwave, he reports that the soup splashed onto his soft left foot at that time. In the interim he has been managing the wound with A & D ointment and daily dressing changes. He describes it was an intact blister that broke open on its own.He reports some increased pain and swelling to the left foot. He rates his pain at triage at a 9/10.  Past Medical History  Diagnosis Date  . Lens disease   . Brain bleed ([place])   . Cataract     bilateral repair with lens implants  . Depression   . Anxiety   . OCD (obsessive compulsive disorder)   . Homocystinuria ([place])   . Hypertension   . Umbilical hernia   . Sleep apnea   . GERD (gastroesophageal reflux disease)   . Stroke [facility])     with brain bleed at age 32-no deficits  . Paresthesia 06/10/2015  . Obesity, Class II, BMI 35-39.9   . Neuromuscular disorder ([place])   . Arthritis     knees,Right wrist  . Family history of adverse reaction to anesthesia     n/v-mom    Patient Active Problem List   Diagnosis Date Noted  . Paresthesia 06/10/2015  . Diplopia 08/15/2014  . Sinus tachycardia ([place]) 08/15/2014  . CVA (cerebral infarction) 08/14/2014  . Intraocular lens dislocation 09/12/2013  . DDD (degenerative disc disease), lumbosacral 07/04/2013  . HTN (hypertension) 07/04/2013  . 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 HYPERLIPIDEMIA 05/28/2008  . ANXIETY 05/28/2008  . OBSESSIVE-COMPULSIVE DISORDER 05/28/2008    Past Surgical History   Procedure Laterality Date  . 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 Wrist surgery Right   . Dermabrasion of face      due to acne scars  . Eye surgery      LASER + SURG BIL   . Pars plana vitrectomy Right 09/17/2013    Procedure: PARS PLANA VITRECTOMY WITH 25G REMOVAL/SUTURE SECONDARY INTRAOCULAR LENS;  Surgeon: Hayden Pedro, MD;  Location: [place];  Service: Ophthalmology;  Laterality: Right;  . Photocoagulation Right 09/17/2013    Procedure: PHOTOCOAGULATION;  Surgeon: Hayden Pedro, MD;  Location: [place];  Service: Ophthalmology;  Laterality: Right;  HEADSCOPE LASER  . Gas/fluid exchange Right 09/17/2013    Procedure: GAS/FLUID EXCHANGE;  Surgeon: Hayden Pedro, MD;  Location: [place];  Service: Ophthalmology;  Laterality: Right;  . Nasal septum surgery  march 2016    @ [facility]  . Umbilical hernia repair N/A 05/07/2015    Procedure: HERNIA REPAIR UMBILICAL ADULT;  Surgeon: Florene Glen, MD;  Location: [facility];  Service: General;  Laterality: N/A;  . Colonoscopy with propofol N/A 07/30/2015    Procedure: COLONOSCOPY WITH PROPOFOL;  Surgeon: [name], MD;  [facility];  Service: Endoscopy;  Laterality: N/A;  . Esophagogastroduodenoscopy (egd) with propofol N/A 07/30/2015    Procedure: ESOPHAGOGASTRODUODENOSCOPY (EGD) WITH PROPOFOL;  Surgeon: [name], MD;  Location:  [facility];  Service: Endoscopy;  Laterality: N/A;    Current Outpatient Rx  Name  Route  Sig  Dispense  Refill  . ALPRAZolam (XANAX) 0.5 MG tablet   Oral   Take 0.5 mg by mouth 3 (three) times daily.         Marland Kitchen amLODipine (NORVASC) 5 MG tablet   Oral   Take 1 tablet (5 mg total) by mouth 2 (two) times daily.   180 tablet   1   . ARIPiprazole (ABILIFY) 10 MG tablet   Oral   Take 10 mg by mouth at bedtime.         Marland Kitchen aspirin EC 81 MG tablet   Oral   Take 81 mg by mouth daily.         Marland Kitchen atorvastatin (LIPITOR) 10 MG tablet   Oral   Take 10  mg by mouth at bedtime.          . Betaine (CYSTADANE) POWD   Oral   Take 7 scoop by mouth 3 (three) times daily. Mix with 4-6 ounces of water For Homocysteneria         . clindamycin (CLEOCIN) 300 MG capsule   Oral   Take 1 capsule (300 mg total) by mouth 4 (four) times daily.   40 capsule   0   . cycloSPORINE (RESTASIS) 0.05 % ophthalmic emulsion   Both Eyes   Place 1 drop into both eyes 2 (two) times daily.         Marland Kitchen esomeprazole (NEXIUM) 20 MG capsule   Oral   Take 20 mg by mouth daily at 12 noon.         . folic acid (FOLVITE) A999333 MCG tablet   Oral   Take 1,200 mcg by mouth daily.         . furosemide (LASIX) 20 MG tablet   Oral   Take 20 mg by mouth every morning.         . hyoscyamine (LEVSIN/SL) 0.125 MG SL tablet   Sublingual   Place 1 tablet (0.125 mg total) under the tongue every 4 (four) hours as needed.   30 tablet   0   . metoprolol (LOPRESSOR) 50 MG tablet   Oral   Take 75 mg by mouth 2 (two) times daily.         . pregabalin (LYRICA) 100 MG capsule   Oral   Take 100 mg by mouth 3 (three) times daily.          Marland Kitchen pyridoxine (B-6) 100 MG tablet   Oral   Take 500 mg by mouth at bedtime.          . sertraline (ZOLOFT) 100 MG tablet   Oral   Take 100 mg by mouth at bedtime.         . silver sulfADIAZINE (SILVADENE) 1 % cream      Apply to affected area daily   25 g   0   . sodium chloride (OCEAN) 0.65 % nasal spray   Nasal   Place 2 sprays into the nose 2 (two) times daily.         Marland Kitchen topiramate (TOPAMAX) 25 MG tablet      Take one tablet at night for one week, then take 2 tablets at night for one week, then take 3 tablets at night. Patient taking differently: Take 25 mg by mouth 3 (three) times daily.    90 tablet   3   .  traZODone (DESYREL) 100 MG tablet   Oral   Take 200 mg by mouth at bedtime.         . vitamin B-12 (CYANOCOBALAMIN) 1000 MCG tablet   Oral   Take 1,000 mcg by mouth at bedtime.            Allergies Pineapple; Amoxicillin; and Penicillins  Family History  Problem Relation Age of Onset  . Breast cancer Mother   . Stroke Other   . Stroke Maternal Grandmother   . Stroke Maternal Grandfather   . Stroke Paternal Grandmother   . Stroke Paternal Grandfather    Social History Social History  Substance Use Topics  . Smoking status: Never Smoker   . Smokeless tobacco: Never Used  . Alcohol Use: No   Review of Systems  Constitutional: Negative for fever. Eyes: Negative for visual changes. ENT: Negative for sore throat. Cardiovascular: Negative for chest pain. Respiratory: Negative for shortness of breath. Gastrointestinal: Negative for abdominal pain, vomiting and diarrhea. Genitourinary: Negative for dysuria. Musculoskeletal: Negative for back pain. Skin: Negative for rash. Left foot burn as above Neurological: Negative for headaches, focal weakness or numbness. ____________________________________________  PHYSICAL EXAM:  VITAL SIGNS: ED Triage Vitals  Enc Vitals Group     BP 12/21/15 1419 139/91 mmHg     Pulse Rate 12/21/15 1419 96     Resp 12/21/15 1419 20     Temp 12/21/15 1419 98.3 F (36.8 C)     Temp Source 12/21/15 1419 Oral     SpO2 12/21/15 1419 95 %     Weight 12/21/15 1419 315 lb (142.883 kg)     Height 12/21/15 1419 6\' 3"  (1.905 m)     Head Cir --      Peak Flow --      Pain Score 12/21/15 1434 9     Pain Loc --      Pain Edu? --      Excl. in [place]? --    Constitutional: Alert and oriented. Well appearing and in no distress. Head: Normocephalic and atraumatic.      Eyes: Conjunctivae are normal. PERRL. Normal extraocular movements      Ears: Canals clear. TMs intact bilaterally.   Nose: No congestion/rhinorrhea.   Mouth/Throat: Mucous membranes are moist.   Neck: Supple. No thyromegaly. Hematological/Lymphatic/Immunological: No cervical lymphadenopathy. Cardiovascular: Normal rate, regular rhythm.  Respiratory: Normal  respiratory effort. No wheezes/rales/rhonchi. Gastrointestinal: Soft and nontender. No distention. Musculoskeletal: Nontender with normal range of motion in all extremities.  Neurologic:  Normal gait without ataxia. Normal speech and language. No gross focal neurologic deficits are appreciated. Skin:  Skin is warm, dry and intact. No rash noted. Left dorsal foot with a large 2nd degree burn from the base of the toes to the midfoot measuring about 6 cm in diameter. There is central yellow eschar and the wound borders are erythematous and rolled-in. The is local erythema with mild edema, but no appreciable lymphangitis, streaking, or purulent drainage.  Psychiatric: Mood and affect are normal. Patient exhibits appropriate insight and judgment. ____________________________________________  PROCEDURES  The burn is cleansed with sterile saline, and wound care provided using Silvadene, Telfa and Kling dressing are applied. ____________________________________________  INITIAL IMPRESSION / ASSESSMENT AND PLAN / ED COURSE  Patient with a second degree burn to the dorsal left foot secondary to hot liquid nearly 1 week prior to arrival. Patient is discharged with instructions on wound care management as well as a prescription for Silvadene ointment and Cleocin to dose  as directed. Arrangements have been made for him to be followed up on Thursday at the [facility] wound care center. Patient is made aware of the appointment time and date. He is encouraged to rest with the foot elevated to reduce swelling and monitor for any worsening of symptoms. ____________________________________________  FINAL CLINICAL IMPRESSION(S) / ED DIAGNOSES  Final diagnoses:  Second degree burn of foot, left, initial encounter      Melvenia Needles, PA-C 12/22/15 [id number]  Nance Pear, MD 12/22/15 1524

## 2015-12-21 NOTE — Discharge Instructions (Signed)
Burn Care °Your skin is a natural barrier to infection. It is the largest organ of your body. Burns damage this natural protection. To help prevent infection, it is very important to follow your caregiver's instructions in the care of your burn. °Burns are classified as: °· First degree. There is only redness of the skin (erythema). No scarring is expected. °· Second degree. There is blistering of the skin. Scarring may occur with deeper burns. °· Third degree. All layers of the skin are injured, and scarring is expected. °HOME CARE INSTRUCTIONS  °· Wash your hands well before changing your bandage. °· Change your bandage as often as directed by your caregiver. °· Remove the old bandage. If the bandage sticks, you may soak it off with cool, clean water. °· Cleanse the burn thoroughly but gently with mild soap and water. °· Pat the area dry with a clean, dry cloth. °· Apply a thin layer of antibacterial cream to the burn. °· Apply a clean bandage as instructed by your caregiver. °· Keep the bandage as clean and dry as possible. °· Elevate the affected area for the first 24 hours, then as instructed by your caregiver. °· Only take over-the-counter or prescription medicines for pain, discomfort, or fever as directed by your caregiver. °SEEK IMMEDIATE MEDICAL CARE IF:  °· You develop excessive pain. °· You develop redness, tenderness, swelling, or red streaks near the burn. °· The burned area develops yellowish-white fluid (pus) or a bad smell. °· You have a fever. °MAKE SURE YOU:  °· Understand these instructions. °· Will watch your condition. °· Will get help right away if you are not doing well or get worse. °  °This information is not intended to replace advice given to you by your health care provider. Make sure you discuss any questions you have with your health care provider. °  °Document Released: 11/21/2005 Document Revised: 02/13/2012 Document Reviewed: 04/13/2011 °Elsevier Interactive Patient Education ©2016  [employer] ° °Second-Degree Burn °A second-degree burn affects the 2 outer layers of skin. The outer layer (epidermis) and the layer underneath it (dermis) are both burned. Another name for this type of burn is a partial thickness burn. A second-degree burn may be called minor or major. This depends on the size of the burn. It also depends on what parts of the skin are burned. Minor burns may be treated with first aid. Major burns are a medical emergency. °A second-degree burn is worse than a first-degree burn, but not as bad as a third-degree burn. A first-degree burn affects only the epidermis. A third-degree burn goes through all the layers of skin. A second-degree burn usually heals in 3 to 4 weeks. A minor second-degree burn usually does not leave a scar. Deeper second-degree burns may lead to scarring of the skin or contractures over joints. Contractures are scars that form over joints and may lead to reduced mobility at those joints. °CAUSES °· Heat (thermal) injury. This happens when skin comes in contact with something very hot. It could be a flame, a hot object, hot liquid, or steam. Most second-degree burns are thermal injuries. °· Radiation. Sunlight is one type of radiation that can burn the skin. Another type of radiation is used to heat food. Radiation is also used to treat some diseases, such as cancer. All types of radiation can burn the skin. Sunlight usually causes a first-degree burn. Radiation used for heating food or treating a disease can cause a second-degree burn. °· Electricity. Electrical burns can cause more   damage under the skin than on the surface. They should always be treated as major burns. °· Chemicals. Many chemicals can burn the skin. The burn should be flushed with cool water and checked by an emergency caregiver. °SYMPTOMS °Symptoms of second-degree burns include: °· Severe pain. °· Extreme tenderness. °· Deep redness. °· Blistered skin. °· Skin that has changed color. It  might look blotchy, wet, or shiny. °· Swelling. °TREATMENT °Some second-degree burns may need to be treated in a hospital. These include major burns, electrical burns, and chemical burns. Many other second-degree burns can be treated with regular first aid, such as: °· Cooling the burn. Use cool, germ-free (sterile) salt water. Place the burned area of skin into a tub of water, or cover the burned area with clean, wet towels. °· Taking pain medicine. °· Removing the dead skin from broken blisters. A trained caregiver may do this. Do not pop blisters. °· Gently washing your skin with mild soap. °· Covering the burned area with a cream. Silver sulfadiazine is a cream for burns. An antibiotic cream, such as bacitracin, may also be used to fight infection. Do not use other ointments or creams unless your caregiver says it is okay. °· Protecting the burn with a sterile, non-sticky bandage. °· Bandaging fingers and toes separately. This keeps them from sticking together. °· Taking an antibiotic. This can help prevent infection. °· Getting a tetanus shot. °HOME CARE INSTRUCTIONS °Medication °· Take any medicine prescribed by your caregiver. Follow the directions carefully. °· Ask your caregiver if you can take over-the-counter medicine to relieve pain and swelling. Do not give aspirin to children. °· Make sure your caregiver knows about all other medicines you take. This includes over-the-counter medicines. °Burn care °· You will need to change the bandage on your burn. You may need to do this 2 or 3 times each day. °¨ Gently clean the burned area. °¨ Put ointment on it. °¨ Cover the burn with a sterile bandage. °· For some deeper burns or burns that cover a large area, compression garments may be prescribed. These garments can help minimize scarring and protect your mobility. °· Do not put butter or oil on your skin. Use only the cream prescribed by your caregiver. °· Do not put ice on your burn. °· Do not break blisters  on your skin. °· Keep the bandaged area dry. You might need to take a sponge bath for awhile. Ask your caregiver when you can take a shower or a tub bath again. °· Do not scratch an itchy burn. Your caregiver may give you medicine to relieve very bad itching. °· Infection is a big danger after a second-degree burn. Tell your caregiver right away if you have signs of infection, such as: °¨ Redness or changing color in the burned area. °¨ Fluid leaking from the burn. °¨ Swelling in the burn area. °¨ A bad smell coming from the wound. °Follow-up °· Keep all follow-up appointments. This is important. This is how your caregiver can tell if your treatment is working. °· Protect your burn from sunlight. Use sunscreen whenever you go outside. Burned areas may be sensitive to the sun for up to 1 year. Exposure to the sun may also cause permanent darkening of scars. °SEEK MEDICAL CARE IF: °· You have any questions about medicines. °· You have any questions about your treatment. °· You wonder if it is okay to do a particular activity. °· You develop a fever of more than 100.5° F (38.1° C). °SEEK IMMEDIATE MEDICAL CARE IF: °·   You think your burn might be infected. It may change color, become red, leak fluid, swell, or smell bad.  You develop a fever of more than 102 F (38.9 C).   This information is not intended to replace advice given to you by your health care provider. Make sure you discuss any questions you have with your health care provider.   Document Released: 04/25/2011 Document Revised: 02/13/2012 Document Reviewed: 04/25/2011 Elsevier Interactive Patient Education 2016 [employer].  Use the antibiotic ointment with daily dressing changes. Take the antibiotic as directed for infection control. Rest with the foot elevated when seated. See the provider at the [facility] for further management.

## 2015-12-24 ENCOUNTER — Ambulatory Visit: Payer: Medicare Other | Admitting: Surgery

## 2015-12-31 DIAGNOSIS — J301 Allergic rhinitis due to pollen: Secondary | ICD-10-CM | POA: Diagnosis not present

## 2016-01-06 DIAGNOSIS — J301 Allergic rhinitis due to pollen: Secondary | ICD-10-CM | POA: Diagnosis not present

## 2016-01-13 DIAGNOSIS — J301 Allergic rhinitis due to pollen: Secondary | ICD-10-CM | POA: Diagnosis not present

## 2016-01-20 DIAGNOSIS — J301 Allergic rhinitis due to pollen: Secondary | ICD-10-CM | POA: Diagnosis not present

## 2016-01-27 DIAGNOSIS — J301 Allergic rhinitis due to pollen: Secondary | ICD-10-CM | POA: Diagnosis not present

## 2016-01-27 DIAGNOSIS — G4733 Obstructive sleep apnea (adult) (pediatric): Secondary | ICD-10-CM | POA: Diagnosis not present

## 2016-02-03 ENCOUNTER — Telehealth: Payer: Self-pay

## 2016-02-03 DIAGNOSIS — J301 Allergic rhinitis due to pollen: Secondary | ICD-10-CM | POA: Diagnosis not present

## 2016-02-03 NOTE — Telephone Encounter (Signed)
Letter mailed for pt to call and set up follow up and labs

## 2016-02-03 NOTE — Telephone Encounter (Signed)
-----   Message from Barron Alvine, [place] sent at 08/06/2015  4:34 PM EDT ----- Pt needs to have labs and follow up see 08/06/15

## 2016-02-10 ENCOUNTER — Other Ambulatory Visit (INDEPENDENT_AMBULATORY_CARE_PROVIDER_SITE_OTHER): Payer: Medicare Other

## 2016-02-10 DIAGNOSIS — K760 Fatty (change of) liver, not elsewhere classified: Secondary | ICD-10-CM | POA: Diagnosis not present

## 2016-02-10 DIAGNOSIS — J301 Allergic rhinitis due to pollen: Secondary | ICD-10-CM | POA: Diagnosis not present

## 2016-02-10 LAB — HEPATIC FUNCTION PANEL
ALBUMIN: 4.1 g/dL (ref 3.5–5.2)
ALK PHOS: 55 U/L (ref 39–117)
ALT: 37 U/L (ref 0–53)
AST: 27 U/L (ref 0–37)
Bilirubin, Direct: 0 mg/dL (ref 0.0–0.3)
TOTAL PROTEIN: 7.8 g/dL (ref 6.0–8.3)
Total Bilirubin: 0.4 mg/dL (ref 0.2–1.2)

## 2016-02-18 DIAGNOSIS — Z91018 Allergy to other foods: Secondary | ICD-10-CM | POA: Diagnosis not present

## 2016-02-18 DIAGNOSIS — Z6841 Body Mass Index (BMI) 40.0 and over, adult: Secondary | ICD-10-CM | POA: Diagnosis not present

## 2016-02-18 DIAGNOSIS — Z880 Allergy status to penicillin: Secondary | ICD-10-CM | POA: Diagnosis not present

## 2016-02-18 DIAGNOSIS — R7303 Prediabetes: Secondary | ICD-10-CM | POA: Diagnosis not present

## 2016-02-18 DIAGNOSIS — Z7189 Other specified counseling: Secondary | ICD-10-CM | POA: Diagnosis not present

## 2016-02-18 DIAGNOSIS — G4733 Obstructive sleep apnea (adult) (pediatric): Secondary | ICD-10-CM | POA: Diagnosis not present

## 2016-02-18 DIAGNOSIS — G629 Polyneuropathy, unspecified: Secondary | ICD-10-CM | POA: Diagnosis not present

## 2016-02-18 DIAGNOSIS — Z7951 Long term (current) use of inhaled steroids: Secondary | ICD-10-CM | POA: Diagnosis not present

## 2016-02-18 DIAGNOSIS — Z8249 Family history of ischemic heart disease and other diseases of the circulatory system: Secondary | ICD-10-CM | POA: Diagnosis not present

## 2016-02-18 DIAGNOSIS — F429 Obsessive-compulsive disorder, unspecified: Secondary | ICD-10-CM | POA: Diagnosis not present

## 2016-02-18 DIAGNOSIS — Z79891 Long term (current) use of opiate analgesic: Secondary | ICD-10-CM | POA: Diagnosis not present

## 2016-02-18 DIAGNOSIS — E7211 Homocystinuria: Secondary | ICD-10-CM | POA: Diagnosis not present

## 2016-02-18 DIAGNOSIS — Z79899 Other long term (current) drug therapy: Secondary | ICD-10-CM | POA: Diagnosis not present

## 2016-02-18 DIAGNOSIS — I10 Essential (primary) hypertension: Secondary | ICD-10-CM | POA: Diagnosis not present

## 2016-02-18 DIAGNOSIS — Z825 Family history of asthma and other chronic lower respiratory diseases: Secondary | ICD-10-CM | POA: Diagnosis not present

## 2016-02-18 DIAGNOSIS — F54 Psychological and behavioral factors associated with disorders or diseases classified elsewhere: Secondary | ICD-10-CM | POA: Diagnosis not present

## 2016-02-18 DIAGNOSIS — Z7982 Long term (current) use of aspirin: Secondary | ICD-10-CM | POA: Diagnosis not present

## 2016-02-18 DIAGNOSIS — Z8673 Personal history of transient ischemic attack (TIA), and cerebral infarction without residual deficits: Secondary | ICD-10-CM | POA: Diagnosis not present

## 2016-02-18 DIAGNOSIS — Z809 Family history of malignant neoplasm, unspecified: Secondary | ICD-10-CM | POA: Diagnosis not present

## 2016-02-25 DIAGNOSIS — J301 Allergic rhinitis due to pollen: Secondary | ICD-10-CM | POA: Diagnosis not present

## 2016-03-02 DIAGNOSIS — J301 Allergic rhinitis due to pollen: Secondary | ICD-10-CM | POA: Diagnosis not present

## 2016-03-09 DIAGNOSIS — J301 Allergic rhinitis due to pollen: Secondary | ICD-10-CM | POA: Diagnosis not present

## 2016-03-16 DIAGNOSIS — J301 Allergic rhinitis due to pollen: Secondary | ICD-10-CM | POA: Diagnosis not present

## 2016-03-31 DIAGNOSIS — R7303 Prediabetes: Secondary | ICD-10-CM | POA: Diagnosis not present

## 2016-03-31 DIAGNOSIS — G4733 Obstructive sleep apnea (adult) (pediatric): Secondary | ICD-10-CM | POA: Diagnosis not present

## 2016-03-31 DIAGNOSIS — Z713 Dietary counseling and surveillance: Secondary | ICD-10-CM | POA: Diagnosis not present

## 2016-03-31 DIAGNOSIS — Z6841 Body Mass Index (BMI) 40.0 and over, adult: Secondary | ICD-10-CM | POA: Diagnosis not present

## 2016-03-31 DIAGNOSIS — I10 Essential (primary) hypertension: Secondary | ICD-10-CM | POA: Diagnosis not present

## 2016-04-21 DIAGNOSIS — J3089 Other allergic rhinitis: Secondary | ICD-10-CM | POA: Diagnosis not present

## 2016-04-21 DIAGNOSIS — I270 Primary pulmonary hypertension: Secondary | ICD-10-CM | POA: Diagnosis not present

## 2016-04-21 DIAGNOSIS — G4733 Obstructive sleep apnea (adult) (pediatric): Secondary | ICD-10-CM | POA: Diagnosis not present

## 2016-04-21 DIAGNOSIS — J301 Allergic rhinitis due to pollen: Secondary | ICD-10-CM | POA: Diagnosis not present

## 2016-04-26 DIAGNOSIS — Z713 Dietary counseling and surveillance: Secondary | ICD-10-CM | POA: Diagnosis not present

## 2016-04-26 DIAGNOSIS — Z6841 Body Mass Index (BMI) 40.0 and over, adult: Secondary | ICD-10-CM | POA: Diagnosis not present

## 2016-04-26 DIAGNOSIS — I10 Essential (primary) hypertension: Secondary | ICD-10-CM | POA: Diagnosis not present

## 2016-04-26 DIAGNOSIS — F429 Obsessive-compulsive disorder, unspecified: Secondary | ICD-10-CM | POA: Diagnosis not present

## 2016-04-26 DIAGNOSIS — G4733 Obstructive sleep apnea (adult) (pediatric): Secondary | ICD-10-CM | POA: Diagnosis not present

## 2016-04-26 DIAGNOSIS — R7303 Prediabetes: Secondary | ICD-10-CM | POA: Diagnosis not present

## 2016-04-27 DIAGNOSIS — J301 Allergic rhinitis due to pollen: Secondary | ICD-10-CM | POA: Diagnosis not present

## 2016-04-27 DIAGNOSIS — J3089 Other allergic rhinitis: Secondary | ICD-10-CM | POA: Diagnosis not present

## 2016-04-29 ENCOUNTER — Emergency Department
Admission: EM | Admit: 2016-04-29 | Discharge: 2016-04-30 | Disposition: A | Discharge status: Home / Self Care | Payer: Medicare Other | Attending: Student | Admitting: Student

## 2016-04-29 ENCOUNTER — Emergency Department: Payer: Medicare Other

## 2016-04-29 ENCOUNTER — Encounter: Payer: Self-pay | Admitting: Emergency Medicine

## 2016-04-29 DIAGNOSIS — Z79899 Other long term (current) drug therapy: Secondary | ICD-10-CM | POA: Diagnosis not present

## 2016-04-29 DIAGNOSIS — R4182 Altered mental status, unspecified: Secondary | ICD-10-CM | POA: Diagnosis present

## 2016-04-29 DIAGNOSIS — M19031 Primary osteoarthritis, right wrist: Secondary | ICD-10-CM | POA: Insufficient documentation

## 2016-04-29 DIAGNOSIS — I10 Essential (primary) hypertension: Secondary | ICD-10-CM | POA: Insufficient documentation

## 2016-04-29 DIAGNOSIS — F1094 Alcohol use, unspecified with alcohol-induced mood disorder: Secondary | ICD-10-CM | POA: Diagnosis not present

## 2016-04-29 DIAGNOSIS — E785 Hyperlipidemia, unspecified: Secondary | ICD-10-CM | POA: Diagnosis not present

## 2016-04-29 DIAGNOSIS — F329 Major depressive disorder, single episode, unspecified: Secondary | ICD-10-CM | POA: Diagnosis not present

## 2016-04-29 DIAGNOSIS — Z8673 Personal history of transient ischemic attack (TIA), and cerebral infarction without residual deficits: Secondary | ICD-10-CM | POA: Insufficient documentation

## 2016-04-29 DIAGNOSIS — M179 Osteoarthritis of knee, unspecified: Secondary | ICD-10-CM | POA: Insufficient documentation

## 2016-04-29 DIAGNOSIS — M5137 Other intervertebral disc degeneration, lumbosacral region: Secondary | ICD-10-CM | POA: Diagnosis not present

## 2016-04-29 DIAGNOSIS — R462 Strange and inexplicable behavior: Secondary | ICD-10-CM | POA: Diagnosis not present

## 2016-04-29 DIAGNOSIS — F429 Obsessive-compulsive disorder, unspecified: Secondary | ICD-10-CM | POA: Diagnosis not present

## 2016-04-29 LAB — CBC WITH DIFFERENTIAL/PLATELET
Basophils Absolute: 0.1 10*3/uL (ref 0–0.1)
Basophils Relative: 1 %
EOS PCT: 1 %
Eosinophils Absolute: 0.1 10*3/uL (ref 0–0.7)
HCT: 41.3 % (ref 40.0–52.0)
HEMOGLOBIN: 14.1 g/dL (ref 13.0–18.0)
LYMPHS ABS: 2.8 10*3/uL (ref 1.0–3.6)
LYMPHS PCT: 31 %
MCH: 30.5 pg (ref 26.0–34.0)
MCHC: 34.2 g/dL (ref 32.0–36.0)
MCV: 89.2 fL (ref 80.0–100.0)
Monocytes Absolute: 1.3 10*3/uL — ABNORMAL HIGH (ref 0.2–1.0)
Monocytes Relative: 14 %
Neutro Abs: 4.7 10*3/uL (ref 1.4–6.5)
Neutrophils Relative %: 53 %
PLATELETS: 185 10*3/uL (ref 150–440)
RBC: 4.63 MIL/uL (ref 4.40–5.90)
RDW: 13.8 % (ref 11.5–14.5)
WBC: 8.9 10*3/uL (ref 3.8–10.6)

## 2016-04-29 LAB — COMPREHENSIVE METABOLIC PANEL
ALK PHOS: 48 U/L (ref 38–126)
ALT: 50 U/L (ref 17–63)
AST: 36 U/L (ref 15–41)
Albumin: 4 g/dL (ref 3.5–5.0)
Anion gap: 11 (ref 5–15)
BUN: 10 mg/dL (ref 6–20)
CALCIUM: 8.5 mg/dL — AB (ref 8.9–10.3)
CO2: 25 mmol/L (ref 22–32)
CREATININE: 0.9 mg/dL (ref 0.61–1.24)
Chloride: 96 mmol/L — ABNORMAL LOW (ref 101–111)
Glucose, Bld: 84 mg/dL (ref 65–99)
Potassium: 3.3 mmol/L — ABNORMAL LOW (ref 3.5–5.1)
Sodium: 132 mmol/L — ABNORMAL LOW (ref 135–145)
Total Bilirubin: 0.7 mg/dL (ref 0.3–1.2)
Total Protein: 8 g/dL (ref 6.5–8.1)

## 2016-04-29 LAB — TROPONIN I

## 2016-04-29 LAB — ACETAMINOPHEN LEVEL: Acetaminophen (Tylenol), Serum: <10 ug/mL — ABNORMAL LOW (ref 10–30)

## 2016-04-29 LAB — SALICYLATE LEVEL

## 2016-04-29 LAB — TSH: TSH: 0.734 u[IU]/mL (ref 0.350–4.500)

## 2016-04-29 LAB — T4, FREE: FREE T4: 1.07 ng/dL (ref 0.61–1.12)

## 2016-04-29 LAB — ETHANOL: ALCOHOL ETHYL (B): 177 mg/dL — AB (ref <5)

## 2016-04-29 MED ORDER — DIPHENHYDRAMINE HCL 50 MG/ML IJ SOLN: 50.0000 mg | Freq: Once | INTRAMUSCULAR | Status: DC

## 2016-04-29 MED ORDER — ZIPRASIDONE MESYLATE 20 MG IM SOLR
INTRAMUSCULAR | Status: AC
  Filled 2016-04-29: qty 20

## 2016-04-29 MED ORDER — HALOPERIDOL LACTATE 5 MG/ML IJ SOLN
INTRAMUSCULAR | Status: AC
  Administered 2016-04-29: 5 mg via INTRAMUSCULAR
  Filled 2016-04-29: qty 1

## 2016-04-29 MED ORDER — LORAZEPAM 2 MG/ML IJ SOLN
2.0000 mg | Freq: Once | INTRAMUSCULAR | Status: AC
  Administered 2016-04-29: 2 mg via INTRAMUSCULAR

## 2016-04-29 MED ORDER — ZIPRASIDONE MESYLATE 20 MG IM SOLR: 20.0000 mg | Freq: Once | INTRAMUSCULAR | Status: DC

## 2016-04-29 MED ORDER — LORAZEPAM 2 MG/ML IJ SOLN
INTRAMUSCULAR | Status: AC
  Administered 2016-04-29: 2 mg via INTRAMUSCULAR
  Filled 2016-04-29: qty 1

## 2016-04-29 MED ORDER — DIPHENHYDRAMINE HCL 50 MG/ML IJ SOLN
INTRAMUSCULAR | Status: AC
  Filled 2016-04-29: qty 1

## 2016-04-29 MED ORDER — HALOPERIDOL LACTATE 5 MG/ML IJ SOLN
5.0000 mg | Freq: Once | INTRAMUSCULAR | Status: AC
  Administered 2016-04-29: 5 mg via INTRAMUSCULAR
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
## 2016-04-29 NOTE — [facility] Counselor (Signed)
Pt is too intoxicated to complete assessment; to be completed once assessed.
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
## 2016-04-29 NOTE — ED Notes (Signed)
Went in to give pt ordered meds because prior to that pt was combative and uncooperative, at that time still was not talking at all. When this RN went in to give meds I said, "Hi Jesus Crosby" and pt responded "Hi." This RN went on to have conversation with pt, pt oriented to self, location, time, disoriented to situation. Denies pain at present. Dr. Edd Fabian notified and to bedside to assess pt.
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
## 2016-04-30 DIAGNOSIS — F1094 Alcohol use, unspecified with alcohol-induced mood disorder: Secondary | ICD-10-CM | POA: Diagnosis not present

## 2016-04-30 DIAGNOSIS — F429 Obsessive-compulsive disorder, unspecified: Secondary | ICD-10-CM

## 2016-04-30 DIAGNOSIS — R4182 Altered mental status, unspecified: Secondary | ICD-10-CM | POA: Diagnosis not present

## 2016-04-30 LAB — URINALYSIS COMPLETE WITH MICROSCOPIC (ARMC ONLY)
BILIRUBIN URINE: NEGATIVE
Bacteria, UA: NONE SEEN
GLUCOSE, UA: NEGATIVE mg/dL
Hgb urine dipstick: NEGATIVE
KETONES UR: NEGATIVE mg/dL
Leukocytes, UA: NEGATIVE
Nitrite: NEGATIVE
PROTEIN: NEGATIVE mg/dL
RBC / HPF: NONE SEEN RBC/hpf (ref 0–5)
SQUAMOUS EPITHELIAL / LPF: NONE SEEN
Specific Gravity, Urine: 1.002 — ABNORMAL LOW (ref 1.005–1.030)
pH: 6 (ref 5.0–8.0)

## 2016-04-30 LAB — URINE DRUG SCREEN, QUALITATIVE (ARMC ONLY)
Amphetamines, Ur Screen: NOT DETECTED
BARBITURATES, UR SCREEN: NOT DETECTED
BENZODIAZEPINE, UR SCRN: NOT DETECTED
CANNABINOID 50 NG, UR ~~LOC~~: NOT DETECTED
Cocaine Metabolite,Ur ~~LOC~~: NOT DETECTED
MDMA (Ecstasy)Ur Screen: NOT DETECTED
METHADONE SCREEN, URINE: NOT DETECTED
Opiate, Ur Screen: NOT DETECTED
Phencyclidine (PCP) Ur S: NOT DETECTED
TRICYCLIC, UR SCREEN: NOT DETECTED

## 2016-04-30 NOTE — Consult Note (Signed)
[place] [facility] Consult   Reason for Consult:  Confusion  Referring Physician:  EDP Patient Identification: Jesus Crosby. MRN:  [id number] Principal Diagnosis: Alcohol induced mood symptoms                                      Obsessive-compulsive disorder Diagnosis:   Patient Active Problem List   Diagnosis Date Noted  . Paresthesia [R20.2] 06/10/2015  . Diplopia [H53.2] 08/15/2014  . Sinus tachycardia ([place]) [R00.0] 08/15/2014  . CVA (cerebral infarction) [I63.9] 08/14/2014  . Intraocular lens dislocation [T85.22XA] 09/12/2013  . DDD (degenerative disc disease), lumbosacral [M51.37] 07/04/2013  . HTN (hypertension) [I10] 07/04/2013  . 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 HYPERLIPIDEMIA [E78.5] 05/28/2008  . ANXIETY [F41.1] 05/28/2008  . OBSESSIVE-COMPULSIVE DISORDER [F42.9] 05/28/2008    Total Time spent with patient: 1 hour  Subjective:   Jesus Crosby. is a 50 y.o. male patient presented to the emergency room due to evaluation of bizarre behavior. He was found by the EMS as his car was stopped in the middle of the intersection. He did not turn his head towards the sound and was not able to verbalize or open windows.  HPI:   Patient is a 50 year old male with history of f bipolar disorder, OCD, hypertension, GERD, homocystinuria, presents to the ED for evaluation of bizarre behavior. According to EMS, his car was stopped in the middle of an intersection. When EMS and police knocked on the car window, he would turn his head towards the sound but would not verbalize or open the doors. Pt's car had to be broken into to get him out. His BAC is 177. During my interview patient reported that he had a blackout and he does not remember anything. He reported that the last thing he remembered was running around in his car. He reported that he has recently started drinking. He stated that he has long history of OCD and his symptoms have  been getting worse for the past few days. He currently follows a psychiatrist in [place] on a regular basis and she has increase the dose of Zoloft to 250 mg daily. He reported that he does not drink on a regular basis. He currently denied having any withdrawal symptoms. He reported that he never had ablackout in the past. He appeared calm and cooperative during the interview. He currently denied having any suicidal homicidal ideations or plans. He reported that he follows with a psychiatrist in [place] consistently and she has been managing his medications.  Past Psychiatric History:   History of OCD and homocystinuria. Patient reported that he was getting medications for homocystinuria on a regular basis but now the cost of the medication is very high. He is unable to afford it. He will be able to get the medications for the  6 months but then it will be very difficult for him. He is applying for the patient assistance program  Risk to Self: Suicidal Ideation: No Suicidal Intent: No Is patient at risk for suicide?: No Suicidal Plan?: No Access to Means: No What has been your use of drugs/alcohol within the last 12 months?: case of beer How many times?: 0 Other Self Harm Risks: None identified Triggers for Past Attempts: None known Intentional Self Injurious Behavior: None Risk to Others: Homicidal Ideation: No Thoughts of Harm to Others: No Current Homicidal Intent: No Current  Homicidal Plan: No Access to Homicidal Means: No Identified Victim: None identified History of harm to others?: No Assessment of Violence: None Noted Violent Behavior Description: None identified Does patient have access to weapons?: No Criminal Charges Pending?: No Does patient have a court date: No Prior Inpatient Therapy: Prior Inpatient Therapy: No Prior Therapy Dates: N/A Prior Therapy Facilty/Provider(s): N/A Reason for Treatment: N/A Prior Outpatient Therapy: Prior Outpatient Therapy: Yes Prior  Therapy Dates: current Prior Therapy Facilty/Provider(s): [facility] Reason for Treatment: OCD Does patient have an ACCT team?: No Does patient have Intensive In-House Services?  : No Does patient have [facility] services? : No Does patient have P4CC services?: No  Past Medical History:  Past Medical History  Diagnosis Date  . Lens disease   . Brain bleed ([place])   . Cataract     bilateral repair with lens implants  . Depression   . Anxiety   . OCD (obsessive compulsive disorder)   . Homocystinuria ([place])   . Hypertension   . Umbilical hernia   . Sleep apnea   . GERD (gastroesophageal reflux disease)   . Stroke [facility])     with brain bleed at age 73-no deficits  . Paresthesia 06/10/2015  . Obesity, Class II, BMI 35-39.9   . Neuromuscular disorder ([place])   . Arthritis     knees,Right wrist  . Family history of adverse reaction to anesthesia     n/v-mom    Past Surgical History  Procedure Laterality Date  . 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 Wrist surgery Right   . Dermabrasion of face      due to acne scars  . Eye surgery      LASER + SURG BIL   . Pars plana vitrectomy Right 09/17/2013    Procedure: PARS PLANA VITRECTOMY WITH 25G REMOVAL/SUTURE SECONDARY INTRAOCULAR LENS;  Surgeon: Hayden Pedro, MD;  Location: [place];  Service: Ophthalmology;  Laterality: Right;  . Photocoagulation Right 09/17/2013    Procedure: PHOTOCOAGULATION;  Surgeon: Hayden Pedro, MD;  Location: [place];  Service: Ophthalmology;  Laterality: Right;  HEADSCOPE LASER  . Gas/fluid exchange Right 09/17/2013    Procedure: GAS/FLUID EXCHANGE;  Surgeon: Hayden Pedro, MD;  Location: [place];  Service: Ophthalmology;  Laterality: Right;  . Nasal septum surgery  march 2016    @ [facility]  . Umbilical hernia repair N/A 05/07/2015    Procedure: HERNIA REPAIR UMBILICAL ADULT;  Surgeon: Florene Glen, MD;  Location: [facility];  Service: General;  Laterality: N/A;  . Colonoscopy with  propofol N/A 07/30/2015    Procedure: COLONOSCOPY WITH PROPOFOL;  Surgeon: [name], MD;  Location: [facility];  Service: Endoscopy;  Laterality: N/A;  . Esophagogastroduodenoscopy (egd) with propofol N/A 07/30/2015    Procedure: ESOPHAGOGASTRODUODENOSCOPY (EGD) WITH PROPOFOL;  Surgeon: [name], MD;  Location: [facility];  Service: Endoscopy;  Laterality: N/A;   Family History:  Family History  Problem Relation Age of Onset  . Breast cancer Mother   . Stroke Other   . Stroke Maternal Grandmother   . Stroke Maternal Grandfather   . Stroke Paternal Grandmother   . Stroke Paternal Grandfather    Family Psychiatric  History:Denied history of psychiatric illness in his family members Social History:  History  Alcohol Use No     History  Drug Use No    Social History   Social History  . Marital Status: Single  Spouse Name: n/a  . Number of Children: 0  . Years of Education: college   Occupational History  . unemployed/disabled     behavioral counselor   Social History Main Topics  . Smoking status: Never Smoker   . Smokeless tobacco: Never Used  . Alcohol Use: No  . Drug Use: No  . Sexual Activity: Not Asked   Other Topics Concern  . None   Social History Narrative   Lives alone. Parents live nearby.  Applying for disability (OCD) due to wrist injury, hearing upcoming.      Patient drinks 4-5 cups of caffeine daily.   Patient is right handed.   Additional Social History:    Allergies:   Allergies  Allergen Reactions  . Pineapple Swelling    Lips swelling  . Amoxicillin Nausea And Vomiting    REACTION: Nausea and vomiting  . Penicillins     Labs:  Results for orders placed or performed during the hospital encounter of 04/29/16 (from the past 48 hour(s))  CBC with Differential     Status: Abnormal   Collection Time: 04/29/16  9:25 PM  Result Value Ref Range   WBC 8.9 3.8 - 10.6 K/uL   RBC 4.63 4.40 - 5.90 MIL/uL   Hemoglobin 14.1 13.0  - 18.0 g/dL   HCT 41.3 40.0 - 52.0 %   MCV 89.2 80.0 - 100.0 fL   MCH 30.5 26.0 - 34.0 pg   MCHC 34.2 32.0 - 36.0 g/dL   RDW 13.8 11.5 - 14.5 %   Platelets 185 150 - 440 K/uL   Neutrophils Relative % 53 %   Neutro Abs 4.7 1.4 - 6.5 K/uL   Lymphocytes Relative 31 %   Lymphs Abs 2.8 1.0 - 3.6 K/uL   Monocytes Relative 14 %   Monocytes Absolute 1.3 (H) 0.2 - 1.0 K/uL   Eosinophils Relative 1 %   Eosinophils Absolute 0.1 0 - 0.7 K/uL   Basophils Relative 1 %   Basophils Absolute 0.1 0 - 0.1 K/uL  Comprehensive metabolic panel     Status: Abnormal   Collection Time: 04/29/16  9:25 PM  Result Value Ref Range   Sodium 132 (L) 135 - 145 mmol/L   Potassium 3.3 (L) 3.5 - 5.1 mmol/L   Chloride 96 (L) 101 - 111 mmol/L   CO2 25 22 - 32 mmol/L   Glucose, Bld 84 65 - 99 mg/dL   BUN 10 6 - 20 mg/dL   Creatinine, Ser 0.90 0.61 - 1.24 mg/dL   Calcium 8.5 (L) 8.9 - 10.3 mg/dL   Total Protein 8.0 6.5 - 8.1 g/dL   Albumin 4.0 3.5 - 5.0 g/dL   AST 36 15 - 41 U/L   ALT 50 17 - 63 U/L   Alkaline Phosphatase 48 38 - 126 U/L   Total Bilirubin 0.7 0.3 - 1.2 mg/dL   GFR calc non Af Amer >60 >60 mL/min   GFR calc Af Amer >60 >60 mL/min    Comment: (NOTE) The eGFR has been calculated using the CKD EPI equation. This calculation has not been validated in all clinical situations. eGFR's persistently <60 mL/min signify possible Chronic Kidney Disease.    Anion gap 11 5 - 15  Troponin I     Status: None   Collection Time: 04/29/16  9:25 PM  Result Value Ref Range   Troponin I <0.03 <0.031 ng/mL    Comment:        NO INDICATION OF MYOCARDIAL INJURY.  Ethanol     Status: Abnormal   Collection Time: 04/29/16  9:25 PM  Result Value Ref Range   Alcohol, Ethyl (B) 177 (H) <5 mg/dL    Comment:        LOWEST DETECTABLE LIMIT FOR SERUM ALCOHOL IS 5 mg/dL FOR MEDICAL PURPOSES ONLY   Acetaminophen level     Status: Abnormal   Collection Time: 04/29/16  9:25 PM  Result Value Ref Range    Acetaminophen (Tylenol), Serum <10 (L) 10 - 30 ug/mL    Comment:        THERAPEUTIC CONCENTRATIONS VARY SIGNIFICANTLY. A RANGE OF 10-30 ug/mL MAY BE AN EFFECTIVE CONCENTRATION FOR MANY PATIENTS. HOWEVER, SOME ARE BEST TREATED AT CONCENTRATIONS OUTSIDE THIS RANGE. ACETAMINOPHEN CONCENTRATIONS >150 ug/mL AT 4 HOURS AFTER INGESTION AND >50 ug/mL AT 12 HOURS AFTER INGESTION ARE OFTEN ASSOCIATED WITH TOXIC REACTIONS.   Salicylate level     Status: None   Collection Time: 04/29/16  9:25 PM  Result Value Ref Range   Salicylate Lvl <4.0 2.8 - 30.0 mg/dL  T4, free     Status: None   Collection Time: 04/29/16  9:25 PM  Result Value Ref Range   Free T4 1.07 0.61 - 1.12 ng/dL  TSH     Status: None   Collection Time: 04/29/16  9:25 PM  Result Value Ref Range   TSH 0.734 0.350 - 4.500 uIU/mL  Urinalysis complete, with microscopic ([facility] only)     Status: Abnormal   Collection Time: 04/30/16 12:01 AM  Result Value Ref Range   Color, Urine COLORLESS (A) YELLOW   APPearance CLEAR (A) CLEAR   Glucose, UA NEGATIVE NEGATIVE mg/dL   Bilirubin Urine NEGATIVE NEGATIVE   Ketones, ur NEGATIVE NEGATIVE mg/dL   Specific Gravity, Urine 1.002 (L) 1.005 - 1.030   Hgb urine dipstick NEGATIVE NEGATIVE   pH 6.0 5.0 - 8.0   Protein, ur NEGATIVE NEGATIVE mg/dL   Nitrite NEGATIVE NEGATIVE   Leukocytes, UA NEGATIVE NEGATIVE   RBC / HPF NONE SEEN 0 - 5 RBC/hpf   WBC, UA 0-5 0 - 5 WBC/hpf   Bacteria, UA NONE SEEN NONE SEEN   Squamous Epithelial / LPF NONE SEEN NONE SEEN  Urine Drug Screen, Qualitative ([facility] only)     Status: None   Collection Time: 04/30/16 12:01 AM  Result Value Ref Range   Tricyclic, Ur Screen NONE DETECTED NONE DETECTED   Amphetamines, Ur Screen NONE DETECTED NONE DETECTED   MDMA (Ecstasy)Ur Screen NONE DETECTED NONE DETECTED   Cocaine Metabolite,Ur [place] NONE DETECTED NONE DETECTED   Opiate, Ur Screen NONE DETECTED NONE DETECTED   Phencyclidine (PCP) Ur S NONE DETECTED NONE DETECTED    Cannabinoid 50 Ng, Ur [place] NONE DETECTED NONE DETECTED   Barbiturates, Ur Screen NONE DETECTED NONE DETECTED   Benzodiazepine, Ur Scrn NONE DETECTED NONE DETECTED   Methadone Scn, Ur NONE DETECTED NONE DETECTED    Comment: (NOTE) 100  Tricyclics, urine               Cutoff 1000 ng/mL 200  Amphetamines, urine             Cutoff 1000 ng/mL 300  MDMA (Ecstasy), urine           Cutoff 500 ng/mL 400  Cocaine Metabolite, urine       Cutoff 300 ng/mL 500  Opiate, urine                   Cutoff 300 ng/mL  600  Phencyclidine (PCP), urine      Cutoff 25 ng/mL 700  Cannabinoid, urine              Cutoff 50 ng/mL 800  Barbiturates, urine             Cutoff 200 ng/mL 900  Benzodiazepine, urine           Cutoff 200 ng/mL 1000 Methadone, urine                Cutoff 300 ng/mL 1100 1200 The urine drug screen provides only a preliminary, unconfirmed 1300 analytical test result and should not be used for non-medical 1400 purposes. Clinical consideration and professional judgment should 1500 be applied to any positive drug screen result due to possible 1600 interfering substances. A more specific alternate chemical method 1700 must be used in order to obtain a confirmed analytical result.  1800 Gas chromato graphy / mass spectrometry (GC/MS) is the preferred 1900 confirmatory method.     No current facility-administered medications for this encounter.   Current Outpatient Prescriptions  Medication Sig Dispense Refill  . ALPRAZolam (XANAX) 0.5 MG tablet Take 0.5 mg by mouth 3 (three) times daily.    Marland Kitchen amLODipine (NORVASC) 5 MG tablet Take 1 tablet (5 mg total) by mouth 2 (two) times daily. 180 tablet 1  . benztropine (COGENTIN) 2 MG tablet Take 2 mg by mouth 2 (two) times daily.    . furosemide (LASIX) 20 MG tablet Take 20 mg by mouth every morning.    . metoprolol (LOPRESSOR) 50 MG tablet Take 75 mg by mouth 2 (two) times daily.    Marland Kitchen omeprazole (PRILOSEC) 20 MG capsule Take 20 mg by mouth daily.     . sertraline (ZOLOFT) 100 MG tablet Take 250 mg by mouth at bedtime.     . sodium chloride (OCEAN) 0.65 % nasal spray Place 2 sprays into the nose 2 (two) times daily.      Musculoskeletal: Strength & Muscle Tone: within normal limits Gait & Station: normal Patient leans: N/A  Psychiatric Specialty Exam: Physical Exam  Review of Systems  Psychiatric/Behavioral: Positive for depression and substance abuse. The patient is nervous/anxious.     Blood pressure 110/74, pulse 66, temperature 98.1 F (36.7 C), temperature source Oral, resp. rate 18, height $RemoveBe'5\' 10"'[id number]$  (1.778 m), weight 315 lb (142.883 kg), SpO2 99 %.Body mass index is 45.2 kg/(m^2).  General Appearance: Casual and Fairly Groomed  Eye Contact:  Fair  Speech:  Clear and Coherent  Volume:  Normal  Mood:  Anxious  Affect:  Congruent  Thought Process:  Goal Directed  Orientation:  Full (Time, Place, and Person)  Thought Content:  WDL  Suicidal Thoughts:  No  Homicidal Thoughts:  No  Memory:  Immediate;   Fair Recent;   Fair Remote;   Fair  Judgement:  Fair  Insight:  Fair  Psychomotor Activity:  Psychomotor Retardation  Concentration:  Concentration: Fair and Attention Span: Fair  Recall:  [employer] of Knowledge:  Fair  Language:  Fair  Akathisia:  No  Handed:  Right  AIMS (if indicated):     Assets:  Communication Skills Physical Health  ADL's:  Intact  Cognition:  WNL  Sleep:        Treatment Plan Summary: Medication management   Patient will be released on the involuntary commitment as he does not meet criteria for the admission to the inpatient psychiatric hospital. He will follow up with his outpatient psychiatrist  in [place] He will continue taking his medications as prescribed He currently denied having any withdrawal symptoms at this time He denied having any suicidal homicidal ideations or plans Case discussed with the ED physician and he also agreed with the plan Thank you for allowing me to  participate in the care of this patient    Disposition: Patient does not meet criteria for psychiatric inpatient admission. Supportive therapy provided about ongoing stressors. Discussed crisis plan, support from social network, calling 911, coming to the Emergency Department, and calling Suicide Hotline.    Rainey Pines, MD 04/30/2016 2:12 PM

## 2016-04-30 NOTE — Progress Notes (Signed)
Pt placed on [facility]  C-1 CPAP with 2L O2 in line. Pt tolerating well.

## 2016-04-30 NOTE — ED Notes (Signed)
BEHAVIORAL HEALTH ROUNDING  Patient sleeping: Yes Patient alert and oriented: Sleeping Behavior appropriate: Yes. ; If no, describe:  Nutrition and fluids offered: No, sleeping  Toileting and hygiene offered: No, sleeping  Sitter present: yes  Law enforcement present: Yes ODS

## 2016-04-30 NOTE — ED Notes (Signed)
Patient's Alprazolam 0.5mg  counted, # 40 pills in bottle, count verified by Elenore Rota, RN. Medication storage form filled out and sent to pharmacy with patients medication. Medication was walked to the pharmacy by this RN and Solmon Ice, Therapist, sports. Medication delivered to [facility].

## 2016-04-30 NOTE — Discharge Instructions (Signed)
You have been seen in the emergency department for a  psychiatric concern. You have been evaluated both medically as well as psychiatrically. Please follow-up with your outpatient resources provided. Return to the emergency department for any worsening symptoms, or any thoughts of hurting yourself or anyone else so that we may attempt to help you.  Confusion Confusion is the inability to think with your usual speed or clarity. Confusion may come on quickly or slowly over time. How quickly the confusion comes on depends on the cause. Confusion can be due to any number of causes. CAUSES   Concussion, head injury, or head trauma.  Seizures.  Stroke.  Fever.  Brain tumor.  Age related decreased brain function (dementia).  Heightened emotional states like rage or terror.  Mental illness in which the person loses the ability to determine what is real and what is not (hallucinations).  Infections such as a urinary tract infection (UTI).  Toxic effects from alcohol, drugs, or prescription medicines.  Dehydration and an imbalance of salts in the body (electrolytes).  Lack of sleep.  Low blood sugar (diabetes).  Low levels of oxygen from conditions such as chronic lung disorders.  Drug interactions or other medicine side effects.  Nutritional deficiencies, especially niacin, thiamine, vitamin C, or vitamin B.  Sudden drop in body temperature (hypothermia).  Change in routine, such as when traveling or hospitalized. SIGNS AND SYMPTOMS  People often describe their thinking as cloudy or unclear when they are confused. Confusion can also include feeling disoriented. That means you are unaware of where or who you are. You may also not know what the date or time is. If confused, you may also have difficulty paying attention, remembering, and making decisions. Some people also act aggressively when they are confused.  DIAGNOSIS  The medical evaluation of confusion may include:  Blood and  urine tests.  X-rays.  Brain and nervous system tests.  Analyzing your brain waves (electroencephalogram or EEG).  Magnetic resonance imaging (MRI) of your head.  Computed tomography (CT) scan of your head.  Mental status tests in which your health care provider may ask many questions. Some of these questions may seem silly or strange, but they are a very important test to help diagnose and treat confusion. TREATMENT  An admission to the hospital may not be needed, but a person with confusion should not be left alone. Stay with a family member or friend until the confusion clears. Avoid alcohol, pain relievers, or sedative drugs until you have fully recovered. Do not drive until directed by your health care provider. HOME CARE INSTRUCTIONS  What family and friends can do:  To find out if someone is confused, ask the person to state his or her name, age, and the date. If the person is unsure or answers incorrectly, he or she is confused.  Always introduce yourself, no matter how well the person knows you.  Often remind the person of his or her location.  Place a calendar and clock near the confused person.  Help the person with his or her medicines. You may want to use a pill box, an alarm as a reminder, or give the person each dose as prescribed.  Talk about current events and plans for the day.  Try to keep the environment calm, quiet, and peaceful.  Make sure the person keeps follow-up visits with his or her health care provider. PREVENTION  Ways to prevent confusion:  Avoid alcohol.  Eat a balanced diet.  Get enough  sleep.  Take medicine only as directed by your health care provider.  Do not become isolated. Spend time with other people and make plans for your days.  Keep careful watch on your blood sugar levels if you are diabetic. SEEK IMMEDIATE MEDICAL CARE IF:   You develop severe headaches, repeated vomiting, seizures, blackouts, or slurred speech.  There is  increasing confusion, weakness, numbness, restlessness, or personality changes.  You develop a loss of balance, have marked dizziness, feel uncoordinated, or fall.  You have delusions, hallucinations, or develop severe anxiety.  Your family members think you need to be rechecked.   This information is not intended to replace advice given to you by your health care provider. Make sure you discuss any questions you have with your health care provider.   Document Released: 12/29/2004 Document Revised: 12/12/2014 Document Reviewed: 12/27/2013 Elsevier Interactive Patient Education [employer].

## 2016-04-30 NOTE — BH Assessment (Signed)
Assessment Note  Jesus Crosby. is an 50 y.o. male , with history of bipolar disorder, OCD, hypertension, GERD, homocystinuria, presents to the ED for evaluation of bizarre behavior. According to EMS, his car was stopped in the middle of an intersection. When EMS and police knocked on the car window, he would turn his head towards the sound but would not verbalize or open the doors. Pt's car had to be broken into to get him out.  Alcohol was found patient's vehicle.  His BAC is 177.  Pt was initially incoherent upon arrival.  After stabilized, patient reports he doesn't remember anything that happened.  Pt denies SI/HI and auditory/visual hallucinations  Diagnosis: Altered Mental Status  Past Medical History:  Past Medical History  Diagnosis Date  . Lens disease   . Brain bleed ([place])   . Cataract     bilateral repair with lens implants  . Depression   . Anxiety   . OCD (obsessive compulsive disorder)   . Homocystinuria ([place])   . Hypertension   . Umbilical hernia   . Sleep apnea   . GERD (gastroesophageal reflux disease)   . Stroke [facility])     with brain bleed at age 51-no deficits  . Paresthesia 06/10/2015  . Obesity, Class II, BMI 35-39.9   . Neuromuscular disorder ([place])   . Arthritis     knees,Right wrist  . Family history of adverse reaction to anesthesia     n/v-mom    Past Surgical History  Procedure Laterality Date  . 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 Wrist surgery Right   . Dermabrasion of face      due to acne scars  . Eye surgery      LASER + SURG BIL   . Pars plana vitrectomy Right 09/17/2013    Procedure: PARS PLANA VITRECTOMY WITH 25G REMOVAL/SUTURE SECONDARY INTRAOCULAR LENS;  Surgeon: Hayden Pedro, MD;  Location: [place];  Service: Ophthalmology;  Laterality: Right;  . Photocoagulation Right 09/17/2013    Procedure: PHOTOCOAGULATION;  Surgeon: Hayden Pedro, MD;  Location: [place];  Service: Ophthalmology;   Laterality: Right;  HEADSCOPE LASER  . Gas/fluid exchange Right 09/17/2013    Procedure: GAS/FLUID EXCHANGE;  Surgeon: Hayden Pedro, MD;  Location: [place];  Service: Ophthalmology;  Laterality: Right;  . Nasal septum surgery  march 2016    @ [facility]  . Umbilical hernia repair N/A 05/07/2015    Procedure: HERNIA REPAIR UMBILICAL ADULT;  Surgeon: Florene Glen, MD;  Location: [facility];  Service: General;  Laterality: N/A;  . Colonoscopy with propofol N/A 07/30/2015    Procedure: COLONOSCOPY WITH PROPOFOL;  Surgeon: [name], MD;  [facility];  Service: Endoscopy;  Laterality: N/A;  . Esophagogastroduodenoscopy (egd) with propofol N/A 07/30/2015    Procedure: ESOPHAGOGASTRODUODENOSCOPY (EGD) WITH PROPOFOL;  Surgeon: [name], MD;  [facility];  Service: Endoscopy;  Laterality: N/A;    Family History:  Family History  Problem Relation Age of Onset  . Breast cancer Mother   . Stroke Other   . Stroke Maternal Grandmother   . Stroke Maternal Grandfather   . Stroke Paternal Grandmother   . Stroke Paternal Grandfather     Social History:  reports that he has never smoked. He has never used smokeless tobacco. He reports that he does not drink alcohol or use illicit drugs.  Additional Social History:  Alcohol / Drug Use History  of alcohol / drug use?: No history of alcohol / drug abuse (Pt denies)  CIWA: CIWA-Ar BP: 122/77 mmHg Pulse Rate: 80 COWS:    Allergies:  Allergies  Allergen Reactions  . Pineapple Swelling    Lips swelling  . Amoxicillin Nausea And Vomiting    REACTION: Nausea and vomiting  . Penicillins     Home Medications:  (Not in a hospital admission)  OB/GYN Status:  No LMP for male patient.  General Assessment Data Location of Assessment: [facility] ED TTS Assessment: In system Is this a Tele or Face-to-Face Assessment?: Face-to-Face Is this an Initial Assessment or a Re-assessment for this encounter?: Initial Assessment Marital  status: Single Maiden name: N/A Is patient pregnant?: No Pregnancy Status: No Living Arrangements: Alone Can pt return to current living arrangement?: Yes Admission Status: Involuntary Is patient capable of signing voluntary admission?: Yes Referral Source: Other  Medical Screening Exam ([place]) Medical Exam completed: Yes  Crisis Care Plan Living Arrangements: Alone Legal Guardian: Other: (self) Name of Psychiatrist: [place] Name of Therapist: [facility]  Education Status Is patient currently in school?: No Current Grade: N/A Highest grade of school patient has completed: N/A Name of school: N/A Contact person: N/A  Risk to self with the past 6 months Suicidal Ideation: No Has patient been a risk to self within the past 6 months prior to admission? : No Suicidal Intent: No Has patient had any suicidal intent within the past 6 months prior to admission? : No Is patient at risk for suicide?: No Suicidal Plan?: No Has patient had any suicidal plan within the past 6 months prior to admission? : No Access to Means: No What has been your use of drugs/alcohol within the last 12 months?: case of beer Previous Attempts/Gestures: No How many times?: 0 Other Self Harm Risks: None identified Triggers for Past Attempts: None known Intentional Self Injurious Behavior: None Family Suicide History: No Recent stressful life event(s): Other (Comment) Persecutory voices/beliefs?: No Depression: No Substance abuse history and/or treatment for substance abuse?: No Suicide prevention information given to non-admitted patients: Not applicable  Risk to Others within the past 6 months Homicidal Ideation: No Does patient have any lifetime risk of violence toward others beyond the six months prior to admission? : No Thoughts of Harm to Others: No Current Homicidal Intent: No Current Homicidal Plan: No Access to Homicidal Means: No Identified Victim: None identified History of harm to  others?: No Assessment of Violence: None Noted Violent Behavior Description: None identified Does patient have access to weapons?: No Criminal Charges Pending?: No Does patient have a court date: No Is patient on probation?: No  Psychosis Hallucinations: None noted Delusions: None noted  Mental Status Report Appearance/Hygiene: In hospital gown Eye Contact: Good Motor Activity: Freedom of movement Speech: Logical/coherent Level of Consciousness: Drowsy Mood: Pleasant Affect: Appropriate to circumstance Anxiety Level: None Thought Processes: Coherent, Relevant Judgement: Unimpaired Orientation: Person, Place, Time Obsessive Compulsive Thoughts/Behaviors: None  Cognitive Functioning Concentration: Good Memory: Recent Impaired IQ: Average Insight: Fair Impulse Control: Fair Appetite: Good Weight Loss: 0 Weight Gain: 0 Sleep: No Change Total Hours of Sleep: 8 Vegetative Symptoms: None  ADLScreening [facility] Assessment Services) Patient's cognitive ability adequate to safely complete daily activities?: Yes Patient able to express need for assistance with ADLs?: Yes Independently performs ADLs?: Yes (appropriate for developmental age)  Prior Inpatient Therapy Prior Inpatient Therapy: No Prior Therapy Dates: N/A Prior Therapy Facilty/Provider(s): N/A Reason for Treatment: N/A  Prior Outpatient Therapy Prior Outpatient Therapy:  Yes Prior Therapy Dates: current Prior Therapy Facilty/Provider(s): [facility] Reason for Treatment: OCD Does patient have an ACCT team?: No Does patient have Intensive In-House Services?  : No Does patient have [facility] services? : No Does patient have P4CC services?: No  ADL Screening (condition at time of admission) Patient's cognitive ability adequate to safely complete daily activities?: Yes Patient able to express need for assistance with ADLs?: Yes Independently performs ADLs?: Yes (appropriate for developmental age)       Abuse/Neglect  Assessment (Assessment to be complete while patient is alone) Physical Abuse: Denies Verbal Abuse: Denies Sexual Abuse: Denies Exploitation of patient/patient's resources: Denies Self-Neglect: Denies Values / Beliefs Cultural Requests During Hospitalization: None Spiritual Requests During Hospitalization: None Consults Spiritual Care Consult Needed: No Social Work Consult Needed: No      Additional Information 1:1 In Past 12 Months?: No CIRT Risk: No Elopement Risk: No Does patient have medical clearance?: No     Disposition:  Disposition Initial Assessment Completed for this Encounter: Yes Disposition of Patient: Other dispositions Other disposition(s): Other (Comment) (Pending Psych Consult)  On Site Evaluation by:   Reviewed with Physician:    Oneita Hurt 04/30/2016 1:38 AM

## 2016-04-30 NOTE — ED Notes (Signed)
[place] ROUNDING  Patient sleeping: No.  Patient alert and oriented: No  Behavior appropriate: No ; If no, describe: Not talking, combative at times, trying to get out of bed Nutrition and fluids offered: yes  Toileting and hygiene offered: Yes  Sitter present: yes Law enforcement present: Yes ODS

## 2016-04-30 NOTE — ED Notes (Signed)
[facility] ROUNDING  Patient sleeping: Yes Patient alert and oriented: Sleeping Behavior appropriate: Yes. ; If no, describe:  Nutrition and fluids offered: No, sleeping  Toileting and hygiene offered: No, sleeping  Sitter present: yes Law enforcement present: Yes ODS

## 2016-04-30 NOTE — ED Provider Notes (Signed)
-----------------------------------------   2:10 PM on 04/30/2016 -----------------------------------------  Patient has been seen and evaluated by psychiatry they believe the patient is safe for discharge home at this time. Patient has been medically cleared. Patient will be discharged home at this time.  Harvest Dark, MD 04/30/16 [phone]

## 2016-04-30 NOTE — ED Notes (Signed)
Patient gave verbal OK to speak with his father. Patient father informed that we are waiting on psych to see patient and then we will know more informed. Patient father states that she will check back after lunch for an update on patient .

## 2016-04-30 NOTE — ED Notes (Addendum)
BEHAVIORAL HEALTH ROUNDING  Patient sleeping: No.  Patient alert and oriented: yes  Behavior appropriate: Yes. ; If no, describe:  Nutrition and fluids offered: yes  Toileting and hygiene offered: Yes  Sitter present: Yes  Law enforcement present: Yes ODS

## 2016-05-04 DIAGNOSIS — J3089 Other allergic rhinitis: Secondary | ICD-10-CM | POA: Diagnosis not present

## 2016-05-04 DIAGNOSIS — J301 Allergic rhinitis due to pollen: Secondary | ICD-10-CM | POA: Diagnosis not present

## 2016-05-04 DIAGNOSIS — R0602 Shortness of breath: Secondary | ICD-10-CM | POA: Diagnosis not present

## 2016-05-04 DIAGNOSIS — I270 Primary pulmonary hypertension: Secondary | ICD-10-CM | POA: Diagnosis not present

## 2016-05-09 DIAGNOSIS — J3089 Other allergic rhinitis: Secondary | ICD-10-CM | POA: Diagnosis not present

## 2016-05-09 DIAGNOSIS — J301 Allergic rhinitis due to pollen: Secondary | ICD-10-CM | POA: Diagnosis not present

## 2016-05-11 DIAGNOSIS — J3089 Other allergic rhinitis: Secondary | ICD-10-CM | POA: Diagnosis not present

## 2016-05-11 DIAGNOSIS — J301 Allergic rhinitis due to pollen: Secondary | ICD-10-CM | POA: Diagnosis not present

## 2016-05-19 DIAGNOSIS — J301 Allergic rhinitis due to pollen: Secondary | ICD-10-CM | POA: Diagnosis not present

## 2016-05-19 DIAGNOSIS — J3089 Other allergic rhinitis: Secondary | ICD-10-CM | POA: Diagnosis not present

## 2016-05-24 DIAGNOSIS — Z713 Dietary counseling and surveillance: Secondary | ICD-10-CM | POA: Diagnosis not present

## 2016-05-24 DIAGNOSIS — I10 Essential (primary) hypertension: Secondary | ICD-10-CM | POA: Diagnosis not present

## 2016-05-24 DIAGNOSIS — G4733 Obstructive sleep apnea (adult) (pediatric): Secondary | ICD-10-CM | POA: Diagnosis not present

## 2016-05-24 DIAGNOSIS — R7303 Prediabetes: Secondary | ICD-10-CM | POA: Diagnosis not present

## 2016-05-24 DIAGNOSIS — E7211 Homocystinuria: Secondary | ICD-10-CM | POA: Diagnosis not present

## 2016-05-24 DIAGNOSIS — F429 Obsessive-compulsive disorder, unspecified: Secondary | ICD-10-CM | POA: Diagnosis not present

## 2016-05-24 DIAGNOSIS — Z6841 Body Mass Index (BMI) 40.0 and over, adult: Secondary | ICD-10-CM | POA: Diagnosis not present

## 2016-06-01 DIAGNOSIS — J301 Allergic rhinitis due to pollen: Secondary | ICD-10-CM | POA: Diagnosis not present

## 2016-06-01 DIAGNOSIS — J3089 Other allergic rhinitis: Secondary | ICD-10-CM | POA: Diagnosis not present

## 2016-06-28 DIAGNOSIS — I10 Essential (primary) hypertension: Secondary | ICD-10-CM | POA: Diagnosis not present

## 2016-06-28 DIAGNOSIS — R7303 Prediabetes: Secondary | ICD-10-CM | POA: Diagnosis not present

## 2016-06-28 DIAGNOSIS — Z713 Dietary counseling and surveillance: Secondary | ICD-10-CM | POA: Diagnosis not present

## 2016-06-28 DIAGNOSIS — Z6841 Body Mass Index (BMI) 40.0 and over, adult: Secondary | ICD-10-CM | POA: Diagnosis not present

## 2016-06-28 DIAGNOSIS — G4733 Obstructive sleep apnea (adult) (pediatric): Secondary | ICD-10-CM | POA: Diagnosis not present

## 2016-06-28 DIAGNOSIS — Z9989 Dependence on other enabling machines and devices: Secondary | ICD-10-CM | POA: Diagnosis not present

## 2016-07-22 DIAGNOSIS — G4733 Obstructive sleep apnea (adult) (pediatric): Secondary | ICD-10-CM | POA: Diagnosis not present

## 2016-07-22 DIAGNOSIS — Z9989 Dependence on other enabling machines and devices: Secondary | ICD-10-CM | POA: Diagnosis not present

## 2016-07-22 DIAGNOSIS — I10 Essential (primary) hypertension: Secondary | ICD-10-CM | POA: Diagnosis not present

## 2016-07-22 DIAGNOSIS — Z713 Dietary counseling and surveillance: Secondary | ICD-10-CM | POA: Diagnosis not present

## 2016-07-22 DIAGNOSIS — R7303 Prediabetes: Secondary | ICD-10-CM | POA: Diagnosis not present

## 2016-07-22 DIAGNOSIS — Z6841 Body Mass Index (BMI) 40.0 and over, adult: Secondary | ICD-10-CM | POA: Diagnosis not present

## 2016-07-27 DIAGNOSIS — J3089 Other allergic rhinitis: Secondary | ICD-10-CM | POA: Diagnosis not present

## 2016-07-27 DIAGNOSIS — J301 Allergic rhinitis due to pollen: Secondary | ICD-10-CM | POA: Diagnosis not present

## 2016-08-10 DIAGNOSIS — Z8673 Personal history of transient ischemic attack (TIA), and cerebral infarction without residual deficits: Secondary | ICD-10-CM | POA: Diagnosis not present

## 2016-08-10 DIAGNOSIS — I10 Essential (primary) hypertension: Secondary | ICD-10-CM | POA: Diagnosis not present

## 2016-08-10 DIAGNOSIS — Z8249 Family history of ischemic heart disease and other diseases of the circulatory system: Secondary | ICD-10-CM | POA: Diagnosis not present

## 2016-08-10 DIAGNOSIS — Z7982 Long term (current) use of aspirin: Secondary | ICD-10-CM | POA: Diagnosis not present

## 2016-08-10 DIAGNOSIS — R7303 Prediabetes: Secondary | ICD-10-CM | POA: Diagnosis not present

## 2016-08-10 DIAGNOSIS — Z6841 Body Mass Index (BMI) 40.0 and over, adult: Secondary | ICD-10-CM | POA: Diagnosis not present

## 2016-08-10 DIAGNOSIS — G4733 Obstructive sleep apnea (adult) (pediatric): Secondary | ICD-10-CM | POA: Diagnosis not present

## 2016-08-10 DIAGNOSIS — F429 Obsessive-compulsive disorder, unspecified: Secondary | ICD-10-CM | POA: Diagnosis not present

## 2016-08-10 DIAGNOSIS — G629 Polyneuropathy, unspecified: Secondary | ICD-10-CM | POA: Diagnosis not present

## 2016-08-11 DIAGNOSIS — J3081 Allergic rhinitis due to animal (cat) (dog) hair and dander: Secondary | ICD-10-CM | POA: Diagnosis not present

## 2016-08-11 DIAGNOSIS — J301 Allergic rhinitis due to pollen: Secondary | ICD-10-CM | POA: Diagnosis not present

## 2016-08-11 DIAGNOSIS — J3089 Other allergic rhinitis: Secondary | ICD-10-CM | POA: Diagnosis not present

## 2016-08-17 DIAGNOSIS — Z7982 Long term (current) use of aspirin: Secondary | ICD-10-CM | POA: Diagnosis not present

## 2016-08-17 DIAGNOSIS — Z91018 Allergy to other foods: Secondary | ICD-10-CM | POA: Diagnosis not present

## 2016-08-17 DIAGNOSIS — G4733 Obstructive sleep apnea (adult) (pediatric): Secondary | ICD-10-CM | POA: Diagnosis not present

## 2016-08-17 DIAGNOSIS — E1142 Type 2 diabetes mellitus with diabetic polyneuropathy: Secondary | ICD-10-CM | POA: Diagnosis present

## 2016-08-17 DIAGNOSIS — E785 Hyperlipidemia, unspecified: Secondary | ICD-10-CM | POA: Diagnosis present

## 2016-08-17 DIAGNOSIS — F429 Obsessive-compulsive disorder, unspecified: Secondary | ICD-10-CM | POA: Diagnosis present

## 2016-08-17 DIAGNOSIS — Z6841 Body Mass Index (BMI) 40.0 and over, adult: Secondary | ICD-10-CM | POA: Diagnosis not present

## 2016-08-17 DIAGNOSIS — Z8669 Personal history of other diseases of the nervous system and sense organs: Secondary | ICD-10-CM | POA: Diagnosis not present

## 2016-08-17 DIAGNOSIS — Z880 Allergy status to penicillin: Secondary | ICD-10-CM | POA: Diagnosis not present

## 2016-08-17 DIAGNOSIS — Z8673 Personal history of transient ischemic attack (TIA), and cerebral infarction without residual deficits: Secondary | ICD-10-CM | POA: Diagnosis not present

## 2016-08-17 DIAGNOSIS — Z9884 Bariatric surgery status: Secondary | ICD-10-CM | POA: Insufficient documentation

## 2016-08-17 DIAGNOSIS — E7211 Homocystinuria: Secondary | ICD-10-CM | POA: Diagnosis present

## 2016-08-17 DIAGNOSIS — I10 Essential (primary) hypertension: Secondary | ICD-10-CM | POA: Diagnosis not present

## 2016-08-17 HISTORY — PX: OTHER SURGICAL HISTORY: SHX169

## 2016-08-24 DIAGNOSIS — Z23 Encounter for immunization: Secondary | ICD-10-CM | POA: Diagnosis not present

## 2016-08-24 DIAGNOSIS — J301 Allergic rhinitis due to pollen: Secondary | ICD-10-CM | POA: Diagnosis not present

## 2016-08-24 DIAGNOSIS — J3089 Other allergic rhinitis: Secondary | ICD-10-CM | POA: Diagnosis not present

## 2016-09-05 DIAGNOSIS — Z9884 Bariatric surgery status: Secondary | ICD-10-CM | POA: Diagnosis not present

## 2016-09-07 DIAGNOSIS — J3081 Allergic rhinitis due to animal (cat) (dog) hair and dander: Secondary | ICD-10-CM | POA: Diagnosis not present

## 2016-09-07 DIAGNOSIS — J301 Allergic rhinitis due to pollen: Secondary | ICD-10-CM | POA: Diagnosis not present

## 2016-09-07 DIAGNOSIS — G4733 Obstructive sleep apnea (adult) (pediatric): Secondary | ICD-10-CM | POA: Diagnosis not present

## 2016-09-07 DIAGNOSIS — J3089 Other allergic rhinitis: Secondary | ICD-10-CM | POA: Diagnosis not present

## 2016-09-21 DIAGNOSIS — J3081 Allergic rhinitis due to animal (cat) (dog) hair and dander: Secondary | ICD-10-CM | POA: Diagnosis not present

## 2016-09-21 DIAGNOSIS — J301 Allergic rhinitis due to pollen: Secondary | ICD-10-CM | POA: Diagnosis not present

## 2016-09-21 DIAGNOSIS — J3089 Other allergic rhinitis: Secondary | ICD-10-CM | POA: Diagnosis not present

## 2016-11-02 DIAGNOSIS — J3081 Allergic rhinitis due to animal (cat) (dog) hair and dander: Secondary | ICD-10-CM | POA: Diagnosis not present

## 2016-11-02 DIAGNOSIS — J301 Allergic rhinitis due to pollen: Secondary | ICD-10-CM | POA: Diagnosis not present

## 2016-11-02 DIAGNOSIS — J3089 Other allergic rhinitis: Secondary | ICD-10-CM | POA: Diagnosis not present

## 2016-11-22 DIAGNOSIS — E669 Obesity, unspecified: Secondary | ICD-10-CM | POA: Diagnosis not present

## 2016-11-22 DIAGNOSIS — G4733 Obstructive sleep apnea (adult) (pediatric): Secondary | ICD-10-CM | POA: Diagnosis not present

## 2016-11-22 DIAGNOSIS — Z1321 Encounter for screening for nutritional disorder: Secondary | ICD-10-CM | POA: Diagnosis not present

## 2016-11-22 DIAGNOSIS — R799 Abnormal finding of blood chemistry, unspecified: Secondary | ICD-10-CM | POA: Diagnosis not present

## 2016-11-22 DIAGNOSIS — R7303 Prediabetes: Secondary | ICD-10-CM | POA: Diagnosis not present

## 2016-11-22 DIAGNOSIS — Z713 Dietary counseling and surveillance: Secondary | ICD-10-CM | POA: Diagnosis not present

## 2016-11-22 DIAGNOSIS — E7211 Homocystinuria: Secondary | ICD-10-CM | POA: Diagnosis not present

## 2016-11-22 DIAGNOSIS — R682 Dry mouth, unspecified: Secondary | ICD-10-CM | POA: Diagnosis not present

## 2016-11-22 DIAGNOSIS — Z9884 Bariatric surgery status: Secondary | ICD-10-CM | POA: Diagnosis not present

## 2016-11-22 DIAGNOSIS — Z6833 Body mass index (BMI) 33.0-33.9, adult: Secondary | ICD-10-CM | POA: Diagnosis not present

## 2016-11-22 DIAGNOSIS — Z4889 Encounter for other specified surgical aftercare: Secondary | ICD-10-CM | POA: Diagnosis not present

## 2016-11-22 DIAGNOSIS — Z9989 Dependence on other enabling machines and devices: Secondary | ICD-10-CM | POA: Diagnosis not present

## 2017-01-24 DIAGNOSIS — I80292 Phlebitis and thrombophlebitis of other deep vessels of left lower extremity: Secondary | ICD-10-CM | POA: Diagnosis not present

## 2017-01-24 DIAGNOSIS — M79605 Pain in left leg: Secondary | ICD-10-CM | POA: Diagnosis not present

## 2017-01-25 DIAGNOSIS — R1030 Lower abdominal pain, unspecified: Secondary | ICD-10-CM | POA: Diagnosis not present

## 2017-02-27 DIAGNOSIS — M7062 Trochanteric bursitis, left hip: Secondary | ICD-10-CM | POA: Diagnosis not present

## 2017-02-27 DIAGNOSIS — M7060 Trochanteric bursitis, unspecified hip: Secondary | ICD-10-CM | POA: Insufficient documentation

## 2017-02-27 DIAGNOSIS — M545 Low back pain: Secondary | ICD-10-CM | POA: Diagnosis not present

## 2017-03-15 DIAGNOSIS — F332 Major depressive disorder, recurrent severe without psychotic features: Secondary | ICD-10-CM | POA: Diagnosis not present

## 2017-03-22 DIAGNOSIS — F332 Major depressive disorder, recurrent severe without psychotic features: Secondary | ICD-10-CM | POA: Diagnosis not present

## 2017-03-23 DIAGNOSIS — M7062 Trochanteric bursitis, left hip: Secondary | ICD-10-CM | POA: Diagnosis not present

## 2017-03-23 DIAGNOSIS — F332 Major depressive disorder, recurrent severe without psychotic features: Secondary | ICD-10-CM | POA: Diagnosis not present

## 2017-03-24 DIAGNOSIS — F332 Major depressive disorder, recurrent severe without psychotic features: Secondary | ICD-10-CM | POA: Diagnosis not present

## 2017-03-27 DIAGNOSIS — F332 Major depressive disorder, recurrent severe without psychotic features: Secondary | ICD-10-CM | POA: Diagnosis not present

## 2017-03-28 DIAGNOSIS — F332 Major depressive disorder, recurrent severe without psychotic features: Secondary | ICD-10-CM | POA: Diagnosis not present

## 2017-03-29 DIAGNOSIS — F332 Major depressive disorder, recurrent severe without psychotic features: Secondary | ICD-10-CM | POA: Diagnosis not present

## 2017-03-30 DIAGNOSIS — M1612 Unilateral primary osteoarthritis, left hip: Secondary | ICD-10-CM | POA: Diagnosis not present

## 2017-03-30 DIAGNOSIS — F332 Major depressive disorder, recurrent severe without psychotic features: Secondary | ICD-10-CM | POA: Diagnosis not present

## 2017-03-31 DIAGNOSIS — F332 Major depressive disorder, recurrent severe without psychotic features: Secondary | ICD-10-CM | POA: Diagnosis not present

## 2017-04-03 DIAGNOSIS — F332 Major depressive disorder, recurrent severe without psychotic features: Secondary | ICD-10-CM | POA: Diagnosis not present

## 2017-04-04 DIAGNOSIS — F332 Major depressive disorder, recurrent severe without psychotic features: Secondary | ICD-10-CM | POA: Diagnosis not present

## 2017-04-05 ENCOUNTER — Other Ambulatory Visit: Payer: Self-pay | Admitting: Orthopedic Surgery

## 2017-04-05 DIAGNOSIS — F332 Major depressive disorder, recurrent severe without psychotic features: Secondary | ICD-10-CM | POA: Diagnosis not present

## 2017-04-05 DIAGNOSIS — E8889 Other specified metabolic disorders: Secondary | ICD-10-CM | POA: Diagnosis not present

## 2017-04-05 DIAGNOSIS — Z7951 Long term (current) use of inhaled steroids: Secondary | ICD-10-CM | POA: Diagnosis not present

## 2017-04-05 DIAGNOSIS — M1612 Unilateral primary osteoarthritis, left hip: Secondary | ICD-10-CM

## 2017-04-05 DIAGNOSIS — Z79899 Other long term (current) drug therapy: Secondary | ICD-10-CM | POA: Diagnosis not present

## 2017-04-05 DIAGNOSIS — F329 Major depressive disorder, single episode, unspecified: Secondary | ICD-10-CM | POA: Diagnosis not present

## 2017-04-05 DIAGNOSIS — Z713 Dietary counseling and surveillance: Secondary | ICD-10-CM | POA: Diagnosis not present

## 2017-04-05 DIAGNOSIS — F429 Obsessive-compulsive disorder, unspecified: Secondary | ICD-10-CM | POA: Diagnosis not present

## 2017-04-05 DIAGNOSIS — E7211 Homocystinuria: Secondary | ICD-10-CM | POA: Diagnosis not present

## 2017-04-06 DIAGNOSIS — F332 Major depressive disorder, recurrent severe without psychotic features: Secondary | ICD-10-CM | POA: Diagnosis not present

## 2017-04-07 DIAGNOSIS — F332 Major depressive disorder, recurrent severe without psychotic features: Secondary | ICD-10-CM | POA: Diagnosis not present

## 2017-04-10 DIAGNOSIS — F332 Major depressive disorder, recurrent severe without psychotic features: Secondary | ICD-10-CM | POA: Diagnosis not present

## 2017-04-11 DIAGNOSIS — Z79899 Other long term (current) drug therapy: Secondary | ICD-10-CM | POA: Diagnosis not present

## 2017-04-11 DIAGNOSIS — F332 Major depressive disorder, recurrent severe without psychotic features: Secondary | ICD-10-CM | POA: Diagnosis not present

## 2017-04-11 DIAGNOSIS — Z0289 Encounter for other administrative examinations: Secondary | ICD-10-CM | POA: Diagnosis not present

## 2017-04-11 DIAGNOSIS — F429 Obsessive-compulsive disorder, unspecified: Secondary | ICD-10-CM | POA: Diagnosis not present

## 2017-04-12 ENCOUNTER — Ambulatory Visit
Admission: RE | Admit: 2017-04-12 | Discharge: 2017-04-12 | Disposition: A | Discharge status: Home / Self Care | Payer: Medicare Other | Source: Ambulatory Visit | Attending: Orthopedic Surgery | Admitting: Orthopedic Surgery

## 2017-04-12 DIAGNOSIS — F332 Major depressive disorder, recurrent severe without psychotic features: Secondary | ICD-10-CM | POA: Diagnosis not present

## 2017-04-12 DIAGNOSIS — M1612 Unilateral primary osteoarthritis, left hip: Secondary | ICD-10-CM | POA: Insufficient documentation

## 2017-04-12 DIAGNOSIS — M25552 Pain in left hip: Secondary | ICD-10-CM | POA: Diagnosis not present

## 2017-04-12 MED ORDER — METHYLPREDNISOLONE ACETATE 40 MG/ML INJ SUSP (RADIOLOG
80.0000 mg | Freq: Once | INTRAMUSCULAR | Status: AC
  Administered 2017-04-12: 80 mg via INTRA_ARTICULAR

## 2017-04-12 MED ORDER — IOPAMIDOL (ISOVUE-200) INJECTION 41%
50.0000 mL | Freq: Once | INTRAVENOUS | Status: AC
  Administered 2017-04-12: 10 mL via INTRA_ARTICULAR
  Filled 2017-04-12: qty 50

## 2017-04-12 MED ORDER — IOPAMIDOL (ISOVUE-200) INJECTION 41%: 7.0000 mL | Freq: Once | INTRAVENOUS | Status: DC

## 2017-04-12 MED ORDER — BUPIVACAINE HCL (PF) 0.25 % IJ SOLN
30.0000 mL | Freq: Once | INTRAMUSCULAR | Status: AC
  Administered 2017-04-12: 7 mL
  Filled 2017-04-12: qty 30

## 2017-04-12 NOTE — Procedures (Signed)
Fluoroscopic guided injection of left hip joint.  80 mg of Depo Medrol and 5 ml of Bupivcaine was injected into left hip with fluoroscopic guidance.  No blood loss.  No immediate complication.

## 2017-04-13 DIAGNOSIS — F332 Major depressive disorder, recurrent severe without psychotic features: Secondary | ICD-10-CM | POA: Diagnosis not present

## 2017-04-14 DIAGNOSIS — F332 Major depressive disorder, recurrent severe without psychotic features: Secondary | ICD-10-CM | POA: Diagnosis not present

## 2017-04-17 DIAGNOSIS — F332 Major depressive disorder, recurrent severe without psychotic features: Secondary | ICD-10-CM | POA: Diagnosis not present

## 2017-04-18 DIAGNOSIS — F332 Major depressive disorder, recurrent severe without psychotic features: Secondary | ICD-10-CM | POA: Diagnosis not present

## 2017-04-19 DIAGNOSIS — F332 Major depressive disorder, recurrent severe without psychotic features: Secondary | ICD-10-CM | POA: Diagnosis not present

## 2017-04-20 DIAGNOSIS — F332 Major depressive disorder, recurrent severe without psychotic features: Secondary | ICD-10-CM | POA: Diagnosis not present

## 2017-04-21 DIAGNOSIS — F332 Major depressive disorder, recurrent severe without psychotic features: Secondary | ICD-10-CM | POA: Diagnosis not present

## 2017-04-22 DIAGNOSIS — M545 Low back pain: Secondary | ICD-10-CM | POA: Diagnosis not present

## 2017-04-24 DIAGNOSIS — F332 Major depressive disorder, recurrent severe without psychotic features: Secondary | ICD-10-CM | POA: Diagnosis not present

## 2017-04-25 DIAGNOSIS — M48061 Spinal stenosis, lumbar region without neurogenic claudication: Secondary | ICD-10-CM | POA: Insufficient documentation

## 2017-04-25 DIAGNOSIS — F332 Major depressive disorder, recurrent severe without psychotic features: Secondary | ICD-10-CM | POA: Diagnosis not present

## 2017-04-26 DIAGNOSIS — F332 Major depressive disorder, recurrent severe without psychotic features: Secondary | ICD-10-CM | POA: Diagnosis not present

## 2017-04-27 DIAGNOSIS — F332 Major depressive disorder, recurrent severe without psychotic features: Secondary | ICD-10-CM | POA: Diagnosis not present

## 2017-04-28 DIAGNOSIS — F332 Major depressive disorder, recurrent severe without psychotic features: Secondary | ICD-10-CM | POA: Diagnosis not present

## 2017-05-02 DIAGNOSIS — F332 Major depressive disorder, recurrent severe without psychotic features: Secondary | ICD-10-CM | POA: Diagnosis not present

## 2017-05-02 DIAGNOSIS — M5136 Other intervertebral disc degeneration, lumbar region: Secondary | ICD-10-CM | POA: Diagnosis not present

## 2017-05-03 DIAGNOSIS — M1612 Unilateral primary osteoarthritis, left hip: Secondary | ICD-10-CM | POA: Diagnosis not present

## 2017-05-03 DIAGNOSIS — F332 Major depressive disorder, recurrent severe without psychotic features: Secondary | ICD-10-CM | POA: Diagnosis not present

## 2017-05-03 DIAGNOSIS — M5136 Other intervertebral disc degeneration, lumbar region: Secondary | ICD-10-CM | POA: Diagnosis not present

## 2017-05-08 DIAGNOSIS — F332 Major depressive disorder, recurrent severe without psychotic features: Secondary | ICD-10-CM | POA: Diagnosis not present

## 2017-05-09 DIAGNOSIS — Z01818 Encounter for other preprocedural examination: Secondary | ICD-10-CM | POA: Diagnosis not present

## 2017-05-09 DIAGNOSIS — Z01812 Encounter for preprocedural laboratory examination: Secondary | ICD-10-CM | POA: Diagnosis not present

## 2017-05-09 DIAGNOSIS — M1612 Unilateral primary osteoarthritis, left hip: Secondary | ICD-10-CM | POA: Diagnosis not present

## 2017-05-09 DIAGNOSIS — I10 Essential (primary) hypertension: Secondary | ICD-10-CM | POA: Diagnosis not present

## 2017-05-10 DIAGNOSIS — F332 Major depressive disorder, recurrent severe without psychotic features: Secondary | ICD-10-CM | POA: Diagnosis not present

## 2017-05-11 DIAGNOSIS — Z01818 Encounter for other preprocedural examination: Secondary | ICD-10-CM | POA: Diagnosis not present

## 2017-05-11 DIAGNOSIS — I10 Essential (primary) hypertension: Secondary | ICD-10-CM | POA: Diagnosis not present

## 2017-05-11 DIAGNOSIS — G4730 Sleep apnea, unspecified: Secondary | ICD-10-CM | POA: Diagnosis not present

## 2017-05-11 DIAGNOSIS — M25552 Pain in left hip: Secondary | ICD-10-CM | POA: Diagnosis not present

## 2017-05-12 DIAGNOSIS — F332 Major depressive disorder, recurrent severe without psychotic features: Secondary | ICD-10-CM | POA: Diagnosis not present

## 2017-05-16 DIAGNOSIS — F332 Major depressive disorder, recurrent severe without psychotic features: Secondary | ICD-10-CM | POA: Diagnosis not present

## 2017-05-18 DIAGNOSIS — F332 Major depressive disorder, recurrent severe without psychotic features: Secondary | ICD-10-CM | POA: Diagnosis not present

## 2017-05-19 DIAGNOSIS — M48061 Spinal stenosis, lumbar region without neurogenic claudication: Secondary | ICD-10-CM | POA: Diagnosis present

## 2017-05-19 DIAGNOSIS — F329 Major depressive disorder, single episode, unspecified: Secondary | ICD-10-CM | POA: Diagnosis not present

## 2017-05-19 DIAGNOSIS — E7211 Homocystinuria: Secondary | ICD-10-CM | POA: Diagnosis present

## 2017-05-19 DIAGNOSIS — Z7982 Long term (current) use of aspirin: Secondary | ICD-10-CM | POA: Diagnosis not present

## 2017-05-19 DIAGNOSIS — M1612 Unilateral primary osteoarthritis, left hip: Secondary | ICD-10-CM | POA: Diagnosis not present

## 2017-05-19 DIAGNOSIS — M25552 Pain in left hip: Secondary | ICD-10-CM | POA: Diagnosis not present

## 2017-05-19 DIAGNOSIS — R269 Unspecified abnormalities of gait and mobility: Secondary | ICD-10-CM | POA: Diagnosis present

## 2017-05-19 DIAGNOSIS — R110 Nausea: Secondary | ICD-10-CM | POA: Diagnosis not present

## 2017-05-19 DIAGNOSIS — F429 Obsessive-compulsive disorder, unspecified: Secondary | ICD-10-CM | POA: Diagnosis present

## 2017-05-19 DIAGNOSIS — Z888 Allergy status to other drugs, medicaments and biological substances status: Secondary | ICD-10-CM | POA: Diagnosis not present

## 2017-05-19 DIAGNOSIS — Z881 Allergy status to other antibiotic agents status: Secondary | ICD-10-CM | POA: Diagnosis not present

## 2017-05-19 DIAGNOSIS — E785 Hyperlipidemia, unspecified: Secondary | ICD-10-CM | POA: Diagnosis present

## 2017-05-19 DIAGNOSIS — G4733 Obstructive sleep apnea (adult) (pediatric): Secondary | ICD-10-CM | POA: Diagnosis present

## 2017-05-19 DIAGNOSIS — E669 Obesity, unspecified: Secondary | ICD-10-CM | POA: Diagnosis present

## 2017-05-19 DIAGNOSIS — F419 Anxiety disorder, unspecified: Secondary | ICD-10-CM | POA: Diagnosis present

## 2017-05-19 DIAGNOSIS — G629 Polyneuropathy, unspecified: Secondary | ICD-10-CM | POA: Diagnosis present

## 2017-05-19 DIAGNOSIS — M5136 Other intervertebral disc degeneration, lumbar region: Secondary | ICD-10-CM | POA: Diagnosis present

## 2017-05-19 DIAGNOSIS — F988 Other specified behavioral and emotional disorders with onset usually occurring in childhood and adolescence: Secondary | ICD-10-CM | POA: Diagnosis present

## 2017-05-19 DIAGNOSIS — Z8673 Personal history of transient ischemic attack (TIA), and cerebral infarction without residual deficits: Secondary | ICD-10-CM | POA: Diagnosis not present

## 2017-05-19 DIAGNOSIS — M25752 Osteophyte, left hip: Secondary | ICD-10-CM | POA: Diagnosis present

## 2017-05-19 DIAGNOSIS — Z961 Presence of intraocular lens: Secondary | ICD-10-CM | POA: Diagnosis present

## 2017-05-19 DIAGNOSIS — H409 Unspecified glaucoma: Secondary | ICD-10-CM | POA: Diagnosis present

## 2017-05-19 DIAGNOSIS — K219 Gastro-esophageal reflux disease without esophagitis: Secondary | ICD-10-CM | POA: Diagnosis present

## 2017-05-19 DIAGNOSIS — H28 Cataract in diseases classified elsewhere: Secondary | ICD-10-CM | POA: Diagnosis present

## 2017-05-19 DIAGNOSIS — M87052 Idiopathic aseptic necrosis of left femur: Secondary | ICD-10-CM | POA: Diagnosis not present

## 2017-05-19 DIAGNOSIS — D696 Thrombocytopenia, unspecified: Secondary | ICD-10-CM | POA: Diagnosis not present

## 2017-05-19 DIAGNOSIS — Z6836 Body mass index (BMI) 36.0-36.9, adult: Secondary | ICD-10-CM | POA: Diagnosis not present

## 2017-05-19 DIAGNOSIS — I10 Essential (primary) hypertension: Secondary | ICD-10-CM | POA: Diagnosis not present

## 2017-05-22 DIAGNOSIS — M6281 Muscle weakness (generalized): Secondary | ICD-10-CM | POA: Diagnosis not present

## 2017-05-24 DIAGNOSIS — M25552 Pain in left hip: Secondary | ICD-10-CM | POA: Diagnosis not present

## 2017-05-24 DIAGNOSIS — R2689 Other abnormalities of gait and mobility: Secondary | ICD-10-CM | POA: Diagnosis not present

## 2017-05-25 DIAGNOSIS — F332 Major depressive disorder, recurrent severe without psychotic features: Secondary | ICD-10-CM | POA: Diagnosis not present

## 2017-05-29 DIAGNOSIS — M25652 Stiffness of left hip, not elsewhere classified: Secondary | ICD-10-CM | POA: Diagnosis not present

## 2017-05-29 DIAGNOSIS — M25552 Pain in left hip: Secondary | ICD-10-CM | POA: Diagnosis not present

## 2017-05-29 DIAGNOSIS — M25659 Stiffness of unspecified hip, not elsewhere classified: Secondary | ICD-10-CM | POA: Diagnosis not present

## 2017-05-31 DIAGNOSIS — M25552 Pain in left hip: Secondary | ICD-10-CM | POA: Diagnosis not present

## 2017-05-31 DIAGNOSIS — R2689 Other abnormalities of gait and mobility: Secondary | ICD-10-CM | POA: Diagnosis not present

## 2017-06-13 DIAGNOSIS — R2689 Other abnormalities of gait and mobility: Secondary | ICD-10-CM | POA: Diagnosis not present

## 2017-06-13 DIAGNOSIS — M25552 Pain in left hip: Secondary | ICD-10-CM | POA: Diagnosis not present

## 2017-06-15 DIAGNOSIS — R2689 Other abnormalities of gait and mobility: Secondary | ICD-10-CM | POA: Diagnosis not present

## 2017-06-15 DIAGNOSIS — M25552 Pain in left hip: Secondary | ICD-10-CM | POA: Diagnosis not present

## 2017-06-19 DIAGNOSIS — Z96649 Presence of unspecified artificial hip joint: Secondary | ICD-10-CM | POA: Diagnosis not present

## 2017-06-21 DIAGNOSIS — F332 Major depressive disorder, recurrent severe without psychotic features: Secondary | ICD-10-CM | POA: Diagnosis not present

## 2017-06-26 DIAGNOSIS — Z96642 Presence of left artificial hip joint: Secondary | ICD-10-CM | POA: Diagnosis not present

## 2017-06-26 DIAGNOSIS — Z96641 Presence of right artificial hip joint: Secondary | ICD-10-CM | POA: Diagnosis not present

## 2017-07-05 DIAGNOSIS — Z96649 Presence of unspecified artificial hip joint: Secondary | ICD-10-CM | POA: Insufficient documentation

## 2017-07-12 DIAGNOSIS — Z96642 Presence of left artificial hip joint: Secondary | ICD-10-CM | POA: Diagnosis not present

## 2017-07-18 DIAGNOSIS — Z79899 Other long term (current) drug therapy: Secondary | ICD-10-CM | POA: Diagnosis not present

## 2017-07-18 DIAGNOSIS — F332 Major depressive disorder, recurrent severe without psychotic features: Secondary | ICD-10-CM | POA: Diagnosis not present

## 2017-07-18 DIAGNOSIS — F429 Obsessive-compulsive disorder, unspecified: Secondary | ICD-10-CM | POA: Diagnosis not present

## 2017-07-28 DIAGNOSIS — E782 Mixed hyperlipidemia: Secondary | ICD-10-CM | POA: Diagnosis not present

## 2017-07-28 DIAGNOSIS — I10 Essential (primary) hypertension: Secondary | ICD-10-CM | POA: Diagnosis not present

## 2017-07-28 DIAGNOSIS — E7211 Homocystinuria: Secondary | ICD-10-CM | POA: Diagnosis not present

## 2017-07-28 DIAGNOSIS — M792 Neuralgia and neuritis, unspecified: Secondary | ICD-10-CM | POA: Diagnosis not present

## 2017-07-28 DIAGNOSIS — M25552 Pain in left hip: Secondary | ICD-10-CM | POA: Diagnosis not present

## 2017-08-08 DIAGNOSIS — R202 Paresthesia of skin: Secondary | ICD-10-CM | POA: Diagnosis not present

## 2017-08-08 DIAGNOSIS — G629 Polyneuropathy, unspecified: Secondary | ICD-10-CM | POA: Diagnosis not present

## 2017-08-08 DIAGNOSIS — R200 Anesthesia of skin: Secondary | ICD-10-CM | POA: Diagnosis not present

## 2017-08-21 DIAGNOSIS — Z96642 Presence of left artificial hip joint: Secondary | ICD-10-CM | POA: Diagnosis not present

## 2017-09-19 DIAGNOSIS — F429 Obsessive-compulsive disorder, unspecified: Secondary | ICD-10-CM | POA: Diagnosis not present

## 2017-09-19 DIAGNOSIS — F332 Major depressive disorder, recurrent severe without psychotic features: Secondary | ICD-10-CM | POA: Diagnosis not present

## 2017-10-23 DIAGNOSIS — Z96642 Presence of left artificial hip joint: Secondary | ICD-10-CM | POA: Diagnosis not present

## 2017-10-29 DIAGNOSIS — S63502A Unspecified sprain of left wrist, initial encounter: Secondary | ICD-10-CM | POA: Diagnosis not present

## 2017-10-30 DIAGNOSIS — S63522A Sprain of radiocarpal joint of left wrist, initial encounter: Secondary | ICD-10-CM | POA: Diagnosis not present

## 2017-11-07 DIAGNOSIS — S63522A Sprain of radiocarpal joint of left wrist, initial encounter: Secondary | ICD-10-CM | POA: Diagnosis not present

## 2017-11-16 DIAGNOSIS — S63522A Sprain of radiocarpal joint of left wrist, initial encounter: Secondary | ICD-10-CM | POA: Diagnosis not present

## 2017-11-16 DIAGNOSIS — S62522A Displaced fracture of distal phalanx of left thumb, initial encounter for closed fracture: Secondary | ICD-10-CM | POA: Diagnosis not present

## 2017-11-26 DIAGNOSIS — J22 Unspecified acute lower respiratory infection: Secondary | ICD-10-CM | POA: Diagnosis not present

## 2017-12-07 DIAGNOSIS — M19031 Primary osteoarthritis, right wrist: Secondary | ICD-10-CM | POA: Diagnosis not present

## 2018-01-15 DIAGNOSIS — M19032 Primary osteoarthritis, left wrist: Secondary | ICD-10-CM | POA: Diagnosis not present

## 2018-01-23 ENCOUNTER — Ambulatory Visit: Payer: Medicare Other | Admitting: Gastroenterology

## 2018-01-24 ENCOUNTER — Ambulatory Visit: Payer: Medicare Other | Admitting: Internal Medicine

## 2018-01-24 ENCOUNTER — Other Ambulatory Visit: Payer: Self-pay | Admitting: Nurse Practitioner

## 2018-01-24 ENCOUNTER — Encounter: Payer: Self-pay | Admitting: Internal Medicine

## 2018-01-24 VITALS — BP 141/88 | HR 93 | Resp 16 | Ht 75.0 in | Wt 333.0 lb

## 2018-01-24 DIAGNOSIS — G609 Hereditary and idiopathic neuropathy, unspecified: Secondary | ICD-10-CM | POA: Diagnosis not present

## 2018-01-24 DIAGNOSIS — Z23 Encounter for immunization: Secondary | ICD-10-CM | POA: Diagnosis not present

## 2018-01-24 DIAGNOSIS — I10 Essential (primary) hypertension: Secondary | ICD-10-CM | POA: Diagnosis not present

## 2018-01-24 DIAGNOSIS — E7849 Other hyperlipidemia: Secondary | ICD-10-CM

## 2018-01-24 DIAGNOSIS — Z0001 Encounter for general adult medical examination with abnormal findings: Secondary | ICD-10-CM | POA: Diagnosis not present

## 2018-01-24 DIAGNOSIS — Z125 Encounter for screening for malignant neoplasm of prostate: Secondary | ICD-10-CM

## 2018-01-24 MED ORDER — METOPROLOL TARTRATE 50 MG PO TABS: 75.0000 mg | ORAL_TABLET | Freq: Two times a day (BID) | ORAL | 3 refills | Status: DC

## 2018-01-24 MED ORDER — LOSARTAN POTASSIUM 50 MG PO TABS: ORAL_TABLET | ORAL | 3 refills | Status: DC

## 2018-01-24 MED ORDER — ATORVASTATIN CALCIUM 10 MG PO TABS: 10.0000 mg | ORAL_TABLET | Freq: Every day | ORAL | 3 refills | Status: DC

## 2018-01-24 MED ORDER — AMLODIPINE BESYLATE 5 MG PO TABS: 5.0000 mg | ORAL_TABLET | Freq: Two times a day (BID) | ORAL | 1 refills | Status: DC

## 2018-01-24 NOTE — Progress Notes (Signed)
[facility] [address] [place] [address]  Internal MEDICINE  Office Visit Note  Patient Name: Jesus Crosby  [id number]  [id number]  Date of Service: 02/12/2018  Chief Complaint  Patient presents with  . Peripheral Neuropathy    toes and ball of feet and in right hand, tingling and burning sensation  . left side around temporal area there is a bump    HPI  Pt is here for routine follow up and surgical clearance for left wrist surgery. Overall feels well  Sees Psych for other problems, will be due for labs and cpe    Current Medication: Outpatient Encounter Medications as of 01/24/2018  Medication Sig Note  . ALPRAZolam (XANAX) 0.5 MG tablet Take 0.5 mg by mouth 3 (three) times daily. 03/25/2014: .   Marland Kitchen amLODipine (NORVASC) 5 MG tablet Take 1 tablet (5 mg total) by mouth 2 (two) times daily.   . ARIPiprazole (ABILIFY) 15 MG tablet Take 10 mg by mouth at bedtime.    Marland Kitchen aspirin 81 MG chewable tablet Chew by mouth daily.   Marland Kitchen atorvastatin (LIPITOR) 10 MG tablet Take 1 tablet (10 mg total) by mouth daily.   . benztropine (COGENTIN) 2 MG tablet Take 2 mg by mouth 2 (two) times daily.   . Cyanocobalamin (VITAMIN B 12 PO) Take 1,000 mcg by mouth.   . folic acid (FOLVITE) 154 MCG tablet Take 400 mcg by mouth at bedtime. 3 tablets at bedtime.   . furosemide (LASIX) 20 MG tablet Take 20 mg by mouth every morning.   . gabapentin (NEURONTIN) 600 MG tablet Take 600 mg by mouth 4 (four) times daily.    . metoprolol tartrate (LOPRESSOR) 50 MG tablet Take 1.5 tablets (75 mg total) by mouth 2 (two) times daily.   Marland Kitchen OLANZapine (ZYPREXA) 10 MG tablet Take 10 mg by mouth at bedtime.   Marland Kitchen omeprazole (PRILOSEC) 20 MG capsule Take 20 mg by mouth daily.   Marland Kitchen pyridOXINE (VITAMIN B-6) 100 MG tablet Take 500 mg by mouth at bedtime.    . sertraline (ZOLOFT) 100 MG tablet Take 100 mg by mouth every morning. 3 tablets in morning.   . traZODone (DESYREL) 100 MG tablet Take 200 mg by mouth  at bedtime.   . [DISCONTINUED] amLODipine (NORVASC) 5 MG tablet Take 1 tablet (5 mg total) by mouth 2 (two) times daily.   . [DISCONTINUED] atorvastatin (LIPITOR) 10 MG tablet Take 10 mg by mouth daily.   . [DISCONTINUED] metoprolol (LOPRESSOR) 50 MG tablet Take 75 mg by mouth 2 (two) times daily.   Marland Kitchen losartan (COZAAR) 50 MG tablet Take half tab po qd   . sodium chloride (OCEAN) 0.65 % nasal spray Place 2 sprays into the nose 2 (two) times daily.    No facility-administered encounter medications on file as of 01/24/2018.     Surgical History: Past Surgical History:  Procedure Laterality Date  . COLONOSCOPY WITH PROPOFOL N/A 07/30/2015   Procedure: COLONOSCOPY WITH PROPOFOL;  Surgeon: [name], MD;  [facility];  Service: Endoscopy;  Laterality: N/A;  . DERMABRASION OF FACE     due to acne scars  . distal radius fracture of right hand (plates, screws, and pins)  2011  . ESOPHAGOGASTRODUODENOSCOPY (EGD) WITH PROPOFOL N/A 07/30/2015   Procedure: ESOPHAGOGASTRODUODENOSCOPY (EGD) WITH PROPOFOL;  Surgeon: [name], MD;  [facility];  Service: Endoscopy;  Laterality: N/A;  . EYE SURGERY     LASER + SURG BIL   .  GAS/FLUID EXCHANGE Right 09/17/2013   Procedure: GAS/FLUID EXCHANGE;  Surgeon: Hayden Pedro, MD;  Location: [place];  Service: Ophthalmology;  Laterality: Right;  . gastric sleeve surgery  08/17/2016  . INTRAOCULAR LENS INSERTION Bilateral    lens disease due to homocysteinuria  . NASAL SEPTUM SURGERY  march 2016   @ [facility]  . PARS PLANA VITRECTOMY Right 09/17/2013   Procedure: PARS PLANA VITRECTOMY WITH 25G REMOVAL/SUTURE SECONDARY INTRAOCULAR LENS;  Surgeon: Hayden Pedro, MD;  Location: [place];  Service: Ophthalmology;  Laterality: Right;  . PHOTOCOAGULATION Right 09/17/2013   Procedure: PHOTOCOAGULATION;  Surgeon: Hayden Pedro, MD;  Location: [place];  Service: Ophthalmology;  Laterality: Right;  HEADSCOPE LASER  . scapholunate ligament  reconstruction of right hand  2011  . UMBILICAL HERNIA REPAIR N/A 05/07/2015   Procedure: HERNIA REPAIR UMBILICAL ADULT;  Surgeon: Florene Glen, MD;  Location: [facility];  Service: General;  Laterality: N/A;  . WRIST SURGERY Right     Medical History: Past Medical History:  Diagnosis Date  . Anxiety   . Arthritis    knees,Right wrist  . Brain bleed ([place])   . Cataract    bilateral repair with lens implants  . Depression   . Family history of adverse reaction to anesthesia    n/v-mom  . GERD (gastroesophageal reflux disease)   . Homocystinuria ([place])   . Hypertension   . Lens disease   . Neuromuscular disorder ([place])   . Obesity, Class II, BMI 35-39.9   . OCD (obsessive compulsive disorder)   . Paresthesia 06/10/2015  . Sleep apnea   . Stroke [facility])    with brain bleed at age 76-no deficits  . Umbilical hernia     Family History: Family History  Problem Relation Age of Onset  . Breast cancer Mother   . Hypertension Father   . Stroke Maternal Grandmother   . Stroke Maternal Grandfather   . Stroke Paternal Grandmother   . Stroke Paternal Grandfather   . Stroke Other     Social History   Socioeconomic History  . Marital status: Single    Spouse name: n/a  . Number of children: 0  . Years of education: college  . Highest education level: Not on file  Social Needs  . Financial resource strain: Not on file  . Food insecurity - worry: Not on file  . Food insecurity - inability: Not on file  . Transportation needs - medical: Not on file  . Transportation needs - non-medical: Not on file  Occupational History  . Occupation: unemployed/disabled    Comment: Lexicographer  Tobacco Use  . Smoking status: Never Smoker  . Smokeless tobacco: Never Used  Substance and Sexual Activity  . Alcohol use: No    Alcohol/week: 0.0 oz  . Drug use: No  . Sexual activity: Not on file  Other Topics Concern  . Not on file  Social History Narrative   Lives alone. Parents  live nearby.  Applying for disability (OCD) due to wrist injury, hearing upcoming.      Patient drinks 4-5 cups of caffeine daily.   Patient is right handed.      Review of Systems  Constitutional: Negative for chills, fatigue and unexpected weight change.  HENT: Positive for postnasal drip. Negative for congestion, rhinorrhea, sneezing and sore throat.   Eyes: Negative for redness.  Respiratory: Negative for cough, chest tightness and shortness of breath.   Cardiovascular: Negative for chest pain and palpitations.  Gastrointestinal: Negative for abdominal pain, constipation, diarrhea, nausea and vomiting.  Genitourinary: Negative for dysuria and frequency.  Musculoskeletal: Positive for arthralgias and back pain. Negative for joint swelling and neck pain.  Skin: Negative for rash.  Neurological: Positive for numbness. Negative for tremors.  Hematological: Negative for adenopathy. Does not bruise/bleed easily.  Psychiatric/Behavioral: Negative for behavioral problems (Depression), sleep disturbance and suicidal ideas. The patient is not nervous/anxious.     Vital Signs: BP (!) 141/88   Pulse 93   Resp 16   Ht 6\' 3"  (1.905 m)   Wt (!) 333 lb (151 kg)   SpO2 93%   BMI 41.62 kg/m    Physical Exam  Constitutional: He is oriented to person, place, and time. He appears well-developed and well-nourished. No distress.  HENT:  Head: Normocephalic and atraumatic.  Mouth/Throat: Oropharynx is clear and moist. No oropharyngeal exudate.  Eyes: EOM are normal. Pupils are equal, round, and reactive to light.  Neck: Normal range of motion. Neck supple. No JVD present. No tracheal deviation present. No thyromegaly present.  Cardiovascular: Normal rate, regular rhythm and normal heart sounds. Exam reveals no gallop and no friction rub.  No murmur heard. Pulmonary/Chest: Effort normal. No respiratory distress. He has no wheezes. He has no rales. He exhibits no tenderness.  Abdominal: Soft.  Bowel sounds are normal.  Musculoskeletal: Normal range of motion.  Lymphadenopathy:    He has no cervical adenopathy.  Neurological: He is alert and oriented to person, place, and time. No cranial nerve deficit.  Skin: Skin is warm and dry. He is not diaphoretic. There is erythema.  Left temple   Psychiatric: He has a normal mood and affect. His behavior is normal. Judgment and thought content normal.    Assessment/Plan: 1. Other hyperlipidemia - atorvastatin (LIPITOR) 10 MG tablet; Take 1 tablet (10 mg total) by mouth daily.  Dispense: 90 tablet; Refill: 3  2. HTN (hypertension) - losartan (COZAAR) 50 MG tablet; Take half tab po qd  Dispense: 45 tablet; Refill: 3 - amLODipine (NORVASC) 5 MG tablet; Take 1 tablet (5 mg total) by mouth 2 (two) times daily.  Dispense: 180 tablet; Refill: 1 - metoprolol tartrate (LOPRESSOR) 50 MG tablet; Take 1.5 tablets (75 mg total) by mouth 2 (two) times daily.  Dispense: 270 tablet; Refill: 3  3. Special screening, prostate cancer - PSA level on ne  4. Needs flu shot - Flu Vaccine MDCK QUAD PF  5. Idiopathic peripheral neuropathy - Continue meds as before.  General Counseling: jocelyn lowery understanding of the findings of todays visit and agrees with plan of treatment. I have discussed any further diagnostic evaluation that may be needed or ordered today. We also reviewed his medications today. he has been encouraged to call the office with any questions or concerns that should arise related to todays visit.  Surgical clearance for ortho. Will need see dermatology after surgery     Orders Placed This Encounter  Procedures  . Flu Vaccine MDCK QUAD PF    Meds ordered this encounter  Medications  . losartan (COZAAR) 50 MG tablet    Sig: Take half tab po qd    Dispense:  45 tablet    Refill:  3  . amLODipine (NORVASC) 5 MG tablet    Sig: Take 1 tablet (5 mg total) by mouth 2 (two) times daily.    Dispense:  180 tablet    Refill:  1   . metoprolol tartrate (LOPRESSOR) 50 MG tablet  Sig: Take 1.5 tablets (75 mg total) by mouth 2 (two) times daily.    Dispense:  270 tablet    Refill:  3  . atorvastatin (LIPITOR) 10 MG tablet    Sig: Take 1 tablet (10 mg total) by mouth daily.    Dispense:  90 tablet    Refill:  3    Time spent:25 Minutes   Dr [name] Internal medicine

## 2018-01-25 LAB — COMPREHENSIVE METABOLIC PANEL
A/G RATIO: 1.2 (ref 1.2–2.2)
ALT: 36 IU/L (ref 0–44)
AST: 38 IU/L (ref 0–40)
Albumin: 4.2 g/dL (ref 3.5–5.5)
Alkaline Phosphatase: 110 IU/L (ref 39–117)
BILIRUBIN TOTAL: 0.3 mg/dL (ref 0.0–1.2)
BUN/Creatinine Ratio: 10 (ref 9–20)
BUN: 8 mg/dL (ref 6–24)
CALCIUM: 8.9 mg/dL (ref 8.7–10.2)
CO2: 19 mmol/L — ABNORMAL LOW (ref 20–29)
Chloride: 88 mmol/L — ABNORMAL LOW (ref 96–106)
Creatinine, Ser: 0.8 mg/dL (ref 0.76–1.27)
GFR, EST AFRICAN AMERICAN: 120 mL/min/{1.73_m2} (ref >59)
GFR, EST NON AFRICAN AMERICAN: 103 mL/min/{1.73_m2} (ref >59)
GLOBULIN, TOTAL: 3.6 g/dL (ref 1.5–4.5)
Glucose: 120 mg/dL — ABNORMAL HIGH (ref 65–99)
POTASSIUM: 4 mmol/L (ref 3.5–5.2)
SODIUM: 127 mmol/L — AB (ref 134–144)
TOTAL PROTEIN: 7.8 g/dL (ref 6.0–8.5)

## 2018-01-25 LAB — CBC
Hematocrit: 42.6 % (ref 37.5–51.0)
Hemoglobin: 14.2 g/dL (ref 13.0–17.7)
MCH: 31 pg (ref 26.6–33.0)
MCHC: 33.3 g/dL (ref 31.5–35.7)
MCV: 93 fL (ref 79–97)
PLATELETS: 197 10*3/uL (ref 150–379)
RBC: 4.58 x10E6/uL (ref 4.14–5.80)
RDW: 14.6 % (ref 12.3–15.4)
WBC: 8.2 10*3/uL (ref 3.4–10.8)

## 2018-01-25 LAB — PSA, TOTAL AND FREE
PSA FREE PCT: 54 %
PSA, Free: 0.27 ng/mL
Prostate Specific Ag, Serum: 0.5 ng/mL (ref 0.0–4.0)

## 2018-01-25 LAB — T4, FREE: FREE T4: 1.14 ng/dL (ref 0.82–1.77)

## 2018-01-25 LAB — TSH: TSH: 1.3 u[IU]/mL (ref 0.450–4.500)

## 2018-01-25 LAB — LIPID PANEL W/O CHOL/HDL RATIO
CHOLESTEROL TOTAL: 215 mg/dL — AB (ref 100–199)
HDL: 36 mg/dL — ABNORMAL LOW (ref >39)
Triglycerides: 571 mg/dL (ref 0–149)

## 2018-01-31 ENCOUNTER — Telehealth: Payer: Self-pay

## 2018-01-31 NOTE — Telephone Encounter (Signed)
-----   Message from [name], MD sent at 01/31/2018  1:02 PM EST ----- Hey can u talk to me about his labs  ----- Message ----- From: Ronnell Freshwater, NP Sent: 01/31/2018   8:20 AM To: [name], MD  Hey Dr. Humphrey Rolls. His triglycerides are really high. Any suggestions? I think he sees cardiology and vascular.

## 2018-01-31 NOTE — Telephone Encounter (Signed)
Spoke to pt and advised that his labs were normal. dbs

## 2018-01-31 NOTE — Progress Notes (Signed)
Hey Dr. Humphrey Rolls. His triglycerides are really high. Any suggestions? I think he sees cardiology and vascular.

## 2018-02-02 DIAGNOSIS — M19032 Primary osteoarthritis, left wrist: Secondary | ICD-10-CM | POA: Diagnosis not present

## 2018-02-15 DIAGNOSIS — F332 Major depressive disorder, recurrent severe without psychotic features: Secondary | ICD-10-CM | POA: Diagnosis not present

## 2018-03-05 ENCOUNTER — Other Ambulatory Visit: Payer: Self-pay | Admitting: Nurse Practitioner

## 2018-03-05 ENCOUNTER — Other Ambulatory Visit: Payer: Self-pay

## 2018-03-05 MED ORDER — FUROSEMIDE 20 MG PO TABS: 20.0000 mg | ORAL_TABLET | Freq: Every morning | ORAL | 3 refills | Status: DC

## 2018-03-12 ENCOUNTER — Other Ambulatory Visit: Payer: Self-pay

## 2018-03-12 MED ORDER — OMEPRAZOLE 20 MG PO CPDR: 20.0000 mg | DELAYED_RELEASE_CAPSULE | Freq: Every day | ORAL | 5 refills | Status: DC

## 2018-03-19 ENCOUNTER — Other Ambulatory Visit: Payer: Self-pay

## 2018-03-19 MED ORDER — OMEPRAZOLE 20 MG PO CPDR: 20.0000 mg | DELAYED_RELEASE_CAPSULE | Freq: Every day | ORAL | 5 refills | Status: DC

## 2018-03-22 DIAGNOSIS — D229 Melanocytic nevi, unspecified: Secondary | ICD-10-CM | POA: Diagnosis not present

## 2018-03-22 DIAGNOSIS — D239 Other benign neoplasm of skin, unspecified: Secondary | ICD-10-CM

## 2018-03-22 DIAGNOSIS — L821 Other seborrheic keratosis: Secondary | ICD-10-CM | POA: Diagnosis not present

## 2018-03-22 DIAGNOSIS — D485 Neoplasm of uncertain behavior of skin: Secondary | ICD-10-CM | POA: Diagnosis not present

## 2018-03-22 DIAGNOSIS — Z1283 Encounter for screening for malignant neoplasm of skin: Secondary | ICD-10-CM | POA: Diagnosis not present

## 2018-03-22 DIAGNOSIS — L578 Other skin changes due to chronic exposure to nonionizing radiation: Secondary | ICD-10-CM | POA: Diagnosis not present

## 2018-03-22 DIAGNOSIS — D225 Melanocytic nevi of trunk: Secondary | ICD-10-CM | POA: Diagnosis not present

## 2018-03-22 DIAGNOSIS — D1800 Hemangioma unspecified site: Secondary | ICD-10-CM | POA: Diagnosis not present

## 2018-03-22 DIAGNOSIS — L719 Rosacea, unspecified: Secondary | ICD-10-CM | POA: Diagnosis not present

## 2018-03-22 DIAGNOSIS — L820 Inflamed seborrheic keratosis: Secondary | ICD-10-CM | POA: Diagnosis not present

## 2018-03-22 DIAGNOSIS — L918 Other hypertrophic disorders of the skin: Secondary | ICD-10-CM | POA: Diagnosis not present

## 2018-03-22 HISTORY — DX: Other benign neoplasm of skin, unspecified: D23.9

## 2018-04-10 DIAGNOSIS — M25572 Pain in left ankle and joints of left foot: Secondary | ICD-10-CM | POA: Diagnosis not present

## 2018-04-10 DIAGNOSIS — M25571 Pain in right ankle and joints of right foot: Secondary | ICD-10-CM | POA: Diagnosis not present

## 2018-04-10 DIAGNOSIS — M170 Bilateral primary osteoarthritis of knee: Secondary | ICD-10-CM | POA: Diagnosis not present

## 2018-04-26 DIAGNOSIS — G4733 Obstructive sleep apnea (adult) (pediatric): Secondary | ICD-10-CM | POA: Insufficient documentation

## 2018-04-26 DIAGNOSIS — H279 Unspecified disorder of lens: Secondary | ICD-10-CM | POA: Insufficient documentation

## 2018-04-26 DIAGNOSIS — M1710 Unilateral primary osteoarthritis, unspecified knee: Secondary | ICD-10-CM | POA: Insufficient documentation

## 2018-04-26 DIAGNOSIS — H269 Unspecified cataract: Secondary | ICD-10-CM | POA: Insufficient documentation

## 2018-04-26 DIAGNOSIS — R7303 Prediabetes: Secondary | ICD-10-CM | POA: Insufficient documentation

## 2018-04-26 DIAGNOSIS — Z9989 Dependence on other enabling machines and devices: Secondary | ICD-10-CM

## 2018-04-26 DIAGNOSIS — M1990 Unspecified osteoarthritis, unspecified site: Secondary | ICD-10-CM | POA: Insufficient documentation

## 2018-04-26 DIAGNOSIS — Q792 Exomphalos: Secondary | ICD-10-CM | POA: Insufficient documentation

## 2018-04-26 DIAGNOSIS — E669 Obesity, unspecified: Secondary | ICD-10-CM | POA: Insufficient documentation

## 2018-04-27 ENCOUNTER — Ambulatory Visit: Payer: Medicare Other

## 2018-04-27 ENCOUNTER — Ambulatory Visit (INDEPENDENT_AMBULATORY_CARE_PROVIDER_SITE_OTHER): Payer: Medicare Other | Admitting: Podiatry

## 2018-04-27 ENCOUNTER — Telehealth: Payer: Self-pay | Admitting: Podiatry

## 2018-04-27 ENCOUNTER — Encounter

## 2018-04-27 ENCOUNTER — Encounter: Payer: Self-pay | Admitting: Podiatry

## 2018-04-27 DIAGNOSIS — M779 Enthesopathy, unspecified: Secondary | ICD-10-CM

## 2018-04-27 DIAGNOSIS — G609 Hereditary and idiopathic neuropathy, unspecified: Secondary | ICD-10-CM | POA: Diagnosis not present

## 2018-04-27 MED ORDER — NONFORMULARY OR COMPOUNDED ITEM: 2 refills | Status: DC

## 2018-04-27 NOTE — Telephone Encounter (Signed)
I saw Dr. Amalia Hailey this morning. He was going to do a prescription for a cream for the neuropathy in my feet. He was getting it through a special pharmacy in [place]. I was wondering when he was going to fax that in? I called and they said they have not received anything yet. I will be going to [place] today with my mom to pick up something and I was going to go by there and pick that up if it was ready. Also, I wanted to know if there is anything I can take or he could prescribe for pain? My pharmacy is [facility] at [address]. My number is [phone]. Thank you very much.

## 2018-04-27 NOTE — Telephone Encounter (Signed)
Patient notified that medication has been phoned into [place]

## 2018-04-30 NOTE — Progress Notes (Signed)
   HPI: 52 year old male presenting today as a new patient with a chief complaint of pain to the bilateral toes and forefeet that began about 6 years ago. He reports associated numbness, tingling and burning secondary to neuropathy of the feet. Walking increases the pain. He has been taking Gabapentin with no significant relief. Patient is here for further evaluation and treatment.   Past Medical History:  Diagnosis Date  . Anxiety   . Arthritis    knees,Right wrist  . Brain bleed ([place])   . Cataract    bilateral repair with lens implants  . Depression   . Family history of adverse reaction to anesthesia    n/v-mom  . GERD (gastroesophageal reflux disease)   . Homocystinuria ([place])   . Hypertension   . Lens disease   . Neuromuscular disorder ([place])   . Obesity, Class II, BMI 35-39.9   . OCD (obsessive compulsive disorder)   . Paresthesia 06/10/2015  . Sleep apnea   . Stroke [facility])    with brain bleed at age 43-no deficits  . Umbilical hernia      Physical Exam: General: The patient is alert and oriented x3 in no acute distress.  Dermatology: Skin is warm, dry and supple bilateral lower extremities. Negative for open lesions or macerations.  Vascular: Palpable pedal pulses bilaterally. No edema or erythema noted. Capillary refill within normal limits.  Neurological: Epicritic and protective threshold grossly intact bilaterally.   Musculoskeletal Exam: Range of motion within normal limits to all pedal and ankle joints bilateral. Muscle strength 5/5 in all groups bilateral.   Assessment: 1. BLE peripheral neuropathy    Plan of Care:  1. Patient evaluated.   2. Prescription for neuropathy pain cream to be dispensed by [facility].  3. Continue taking Gabapentin.  4. Information given regarding [employer].  5. Return to clinic as needed.       Edrick Kins, DPM [facility]  Dr. Edrick Kins, DPM    [address], [place] [address]                Office [phone]  Fax [phone]

## 2018-05-01 ENCOUNTER — Ambulatory Visit: Payer: Self-pay | Admitting: Internal Medicine

## 2018-05-16 ENCOUNTER — Telehealth: Payer: Self-pay | Admitting: Podiatry

## 2018-05-16 NOTE — Telephone Encounter (Signed)
Dr. Amalia Hailey, please advise on what else we can give him for the tingling pain

## 2018-05-16 NOTE — Telephone Encounter (Signed)
Nothing really. He already takes Gabapentin high doses I believe. Not much else I can prescribe.  Thanks, Dr. Amalia Hailey

## 2018-05-16 NOTE — Telephone Encounter (Signed)
I saw Dr. Amalia Hailey on 24 May for neuropathy and he prescribed me some cream. I actually contacted that Doctor that he talked to me about for doing the surgery of the feet where they go in and do the neuropathy surgery. That doctor does not do it anymore as he is retired. They have referred me to a doctor in [place] so I'm going to go see him but I cannot see him until 01 July. I'm having severe pain and throbbing. I didn't know if there was anything Dr. Amalia Hailey could prescribe for that. I go to the [facility] on [address] in [place] and their number is [phone]. I haven't been able to sleep well at night for about the past 2 weeks. The tingling on the bottom of my foot seems to be getting worse and I'm just not able to get up and do anything really due to the pain. Thank you very much.

## 2018-05-16 NOTE — Telephone Encounter (Signed)
Patient called and left a voicemail stating the cream that was prescribed to him is not working and was wondering for the nerve pain he's been feeling if he could be prescribed a relaxer or whatever can help with the pain. Please call patient back at [phone]

## 2018-05-17 ENCOUNTER — Telehealth: Payer: Self-pay | Admitting: Podiatry

## 2018-05-17 NOTE — Telephone Encounter (Signed)
I saw Dr. Amalia Hailey on 24 May. I have really bad neuropathy of my feet. I'm going to see a Psychologist, sport and exercise in [place] 01 July for the decompression surgery. I'm in severe pain and I'm not able to walk very well. I was wondering if there is something I can take to help with that? He had given me the medicine cream through the pharmacy in [place] but it does not help at all and I'm on 3,000 mg of the gabapentin now. I'm trying to get an appointment with a neurologist but it's going to be a while as I have to get a referral and stuff. It feels like my feet are going to blow up, especially around my toe area. If someone could please call me back at [phone] I would appreciate it.

## 2018-05-17 NOTE — Telephone Encounter (Signed)
Left message to call back  

## 2018-05-17 NOTE — Telephone Encounter (Signed)
Returned patient call, left message for him to call office back

## 2018-05-23 DIAGNOSIS — R202 Paresthesia of skin: Secondary | ICD-10-CM | POA: Diagnosis not present

## 2018-06-19 DIAGNOSIS — T452X1D Poisoning by vitamins, accidental (unintentional), subsequent encounter: Secondary | ICD-10-CM | POA: Diagnosis not present

## 2018-06-19 DIAGNOSIS — E7211 Homocystinuria: Secondary | ICD-10-CM | POA: Diagnosis not present

## 2018-06-19 DIAGNOSIS — I639 Cerebral infarction, unspecified: Secondary | ICD-10-CM | POA: Diagnosis not present

## 2018-06-19 DIAGNOSIS — G5602 Carpal tunnel syndrome, left upper limb: Secondary | ICD-10-CM | POA: Diagnosis not present

## 2018-06-19 DIAGNOSIS — G629 Polyneuropathy, unspecified: Secondary | ICD-10-CM | POA: Diagnosis not present

## 2018-06-19 DIAGNOSIS — G5622 Lesion of ulnar nerve, left upper limb: Secondary | ICD-10-CM | POA: Diagnosis not present

## 2018-06-19 DIAGNOSIS — G622 Polyneuropathy due to other toxic agents: Secondary | ICD-10-CM | POA: Diagnosis not present

## 2018-06-19 DIAGNOSIS — G603 Idiopathic progressive neuropathy: Secondary | ICD-10-CM | POA: Diagnosis not present

## 2018-06-20 ENCOUNTER — Other Ambulatory Visit: Payer: Self-pay

## 2018-06-20 MED ORDER — FUROSEMIDE 20 MG PO TABS: 20.0000 mg | ORAL_TABLET | Freq: Every morning | ORAL | 3 refills | Status: DC

## 2018-06-28 DIAGNOSIS — Z79891 Long term (current) use of opiate analgesic: Secondary | ICD-10-CM | POA: Diagnosis not present

## 2018-06-28 DIAGNOSIS — F429 Obsessive-compulsive disorder, unspecified: Secondary | ICD-10-CM | POA: Diagnosis not present

## 2018-06-28 DIAGNOSIS — F332 Major depressive disorder, recurrent severe without psychotic features: Secondary | ICD-10-CM | POA: Diagnosis not present

## 2018-08-24 ENCOUNTER — Other Ambulatory Visit: Payer: Self-pay

## 2018-08-24 DIAGNOSIS — I10 Essential (primary) hypertension: Secondary | ICD-10-CM

## 2018-08-24 MED ORDER — METOPROLOL TARTRATE 50 MG PO TABS: 75.0000 mg | ORAL_TABLET | Freq: Two times a day (BID) | ORAL | 0 refills | Status: DC

## 2018-08-27 ENCOUNTER — Other Ambulatory Visit: Payer: Self-pay

## 2018-08-27 DIAGNOSIS — I10 Essential (primary) hypertension: Secondary | ICD-10-CM

## 2018-08-27 MED ORDER — METOPROLOL TARTRATE 50 MG PO TABS: 75.0000 mg | ORAL_TABLET | Freq: Two times a day (BID) | ORAL | 0 refills | Status: DC

## 2018-08-28 ENCOUNTER — Ambulatory Visit (INDEPENDENT_AMBULATORY_CARE_PROVIDER_SITE_OTHER): Payer: Medicare HMO | Admitting: Adult Health

## 2018-08-28 ENCOUNTER — Encounter: Payer: Self-pay | Admitting: Adult Health

## 2018-08-28 VITALS — BP 126/88 | HR 84 | Temp 98.8°F | Resp 16 | Ht 75.0 in | Wt 303.0 lb

## 2018-08-28 DIAGNOSIS — Z9989 Dependence on other enabling machines and devices: Secondary | ICD-10-CM

## 2018-08-28 DIAGNOSIS — I10 Essential (primary) hypertension: Secondary | ICD-10-CM | POA: Diagnosis not present

## 2018-08-28 DIAGNOSIS — G4733 Obstructive sleep apnea (adult) (pediatric): Secondary | ICD-10-CM | POA: Diagnosis not present

## 2018-08-28 DIAGNOSIS — G4734 Idiopathic sleep related nonobstructive alveolar hypoventilation: Secondary | ICD-10-CM | POA: Diagnosis not present

## 2018-08-28 DIAGNOSIS — Z9981 Dependence on supplemental oxygen: Secondary | ICD-10-CM | POA: Diagnosis not present

## 2018-08-28 DIAGNOSIS — Z23 Encounter for immunization: Secondary | ICD-10-CM

## 2018-08-28 NOTE — Patient Instructions (Signed)
Hypoxia Hypoxia is a condition that happens when there is a lack of oxygen in the body's tissues and organs. When there is not enough oxygen, organs cannot work as they should. This causes serious problems throughout the body and in the brain. What are the causes? This condition may be caused by:  Exposure to high altitude.  A collapsed lung (pneumothorax).  Lung infection (pneumonia).  Lung injury.  Long-term (chronic) lung disease, such as COPD (chronic obstructive pulmonary disease).  Blood collecting in the chest cavity (hemothorax).  Food, saliva, or vomit getting into the airway (aspiration).  Reduced blood flow (ischemia).  Severe blood loss.  Slow or shallow breathing (hypoventilation).  Blood disorders, such as anemia.  Carbon monoxide poisoning.  The heart suddenly stopping (cardiac arrest).  Anesthetic medicines.  Drowning.  Choking.  What are the signs or symptoms? Symptoms of this condition include:  Headache.  Fatigue.  Drowsiness.  Forgetfulness.  Nausea.  Confusion.  Shortness of breath.  Dizziness.  Bluish color of the skin, lips, or nail beds (cyanosis).  Change in consciousness or awareness.  If hypoxia is not treated, it can lead to convulsions, loss of consciousness (coma), or brain damage. How is this diagnosed? This condition may be diagnosed based on:  A physical exam.  Blood tests.  A test that measures how much oxygen is in your blood (pulse oximetry). This is done with a sensor that is placed on your finger, toe, or earlobe.  Chest X-ray.  Tests to check your lung function (pulmonary function tests).  A test to check the electrical activity of your heart (electrocardiogram, ECG).  You may have other tests to determine the cause of your hypoxia. How is this treated? Treatment for this condition depends on what is causing the hypoxia. You will likely be treated with oxygen therapy. This may be done by giving you  oxygen through a face mask or through tubes in your nose. Your health care provider may also recommend other therapies to treat the underlying cause of your hypoxia. Follow these instructions at home:  Take over-the-counter and prescription medicines only as told by your health care provider.  Do not use any products that contain nicotine or tobacco, such as cigarettes and e-cigarettes. If you need help quitting, ask your health care provider.  Avoid secondhand smoke.  Work with your health care provider to manage any chronic conditions you have that may be causing hypoxia, such as COPD.  Keep all follow-up visits as told by your health care provider. This is important. Contact a health care provider if:  You have a fever.  You have trouble breathing, even after treatment.  You become extremely short of breath when you exercise. Get help right away if:  Your shortness of breath gets worse, especially with normal or very little activity.  Your skin, lips, or nail beds have a bluish color.  You become confused or you cannot think properly.  You have chest pain. Summary  Hypoxia is a condition that happens when there is a lack of oxygen in the body's tissues and organs.  If hypoxia is not treated, it can lead to convulsions, loss of consciousness (coma), or brain damage.  Symptoms of hypoxia can include a headache, shortness of breath, confusion, nausea, and a bluish skin color.  Hypoxia has many possible causes, including exposure to high altitude, carbon monoxide poisoning, or other health issues, such as blood disorders or cardiac arrest.  Hypoxia is usually treated with oxygen therapy. This information   is not intended to replace advice given to you by your health care provider. Make sure you discuss any questions you have with your health care provider. Document Released: 01/09/2017 Document Revised: 01/09/2017 Document Reviewed: 01/09/2017 Elsevier Interactive Patient  Education  2018 [employer] Inc.  

## 2018-08-28 NOTE — Progress Notes (Signed)
[facility] [place], [place] [address]  Pulmonary Sleep Medicine   Office Visit Note  Patient Name: Jesus Crosby. DOB: [birth year] MRN [id number]  Date of Service: 08/28/2018  Complaints/HPI: Pt here reporting his oxygen machine that goes with his CPAP machine, will not stay on. He called [facility] who reports he will need a new "study" to get a new machine.  He also uses 2 liter of oxygen through his cpap at night for nocturnal hypoxia.  He denies other issues at this time.  He has been without his oxygen through the CPAP for about one week now.  He reports daytime fatigue, and feeling different since it does not work now.    ROS  General: (-) fever, (-) chills, (-) night sweats, (-) weakness Skin: (-) rashes, (-) itching,. Eyes: (-) visual changes, (-) redness, (-) itching. Nose and Sinuses: (-) nasal stuffiness or itchiness, (-) postnasal drip, (-) nosebleeds, (-) sinus trouble. Mouth and Throat: (-) sore throat, (-) hoarseness. Neck: (-) swollen glands, (-) enlarged thyroid, (-) neck pain. Respiratory: - cough, (-) bloody sputum, - shortness of breath, - wheezing. Cardiovascular: - ankle swelling, (-) chest pain. Lymphatic: (-) lymph node enlargement. Neurologic: (-) numbness, (-) tingling. Psychiatric: (-) anxiety, (-) depression   Current Medication: Outpatient Encounter Medications as of 08/28/2018  Medication Sig Note  . ALPRAZolam (XANAX) 0.5 MG tablet Take 0.5 mg by mouth 3 (three) times daily. 03/25/2014: .   Marland Kitchen amLODipine (NORVASC) 5 MG tablet Take 1 tablet (5 mg total) by mouth 2 (two) times daily.   Marland Kitchen aspirin 81 MG chewable tablet Chew by mouth daily.   Marland Kitchen atorvastatin (LIPITOR) 10 MG tablet Take 1 tablet (10 mg total) by mouth daily.   . benztropine (COGENTIN) 2 MG tablet Take 2 mg by mouth 2 (two) times daily.   . Cyanocobalamin (VITAMIN B 12 PO) Take 1,000 mcg by mouth.   . dicyclomine (BENTYL) 20 MG tablet Take by mouth.    . folic acid (FOLVITE) 562 MCG tablet Take 400 mcg by mouth at bedtime. 3 tablets at bedtime.   . furosemide (LASIX) 20 MG tablet Take 1 tablet (20 mg total) by mouth every morning.   Marland Kitchen losartan (COZAAR) 50 MG tablet Take half tab po qd   . metoprolol tartrate (LOPRESSOR) 50 MG tablet Take 1.5 tablets (75 mg total) by mouth 2 (two) times daily.   Marland Kitchen omeprazole (PRILOSEC) 20 MG capsule Take 1 capsule (20 mg total) by mouth daily.   . ondansetron (ZOFRAN) 4 MG tablet TK 1 T PO Q 6 TO 8 H PRN N   . pregabalin (LYRICA) 150 MG capsule Take 150 mg by mouth 2 (two) times daily.   Marland Kitchen pyridOXINE (VITAMIN B-6) 100 MG tablet Take 500 mg by mouth at bedtime.    . sertraline (ZOLOFT) 100 MG tablet Take 100 mg by mouth every morning. 3 tablets in morning.   . traZODone (DESYREL) 100 MG tablet Take 200 mg by mouth at bedtime.   . gabapentin (NEURONTIN) 600 MG tablet Take 600 mg by mouth 4 (four) times daily.    . naltrexone (DEPADE) 50 MG tablet TK 1 T PO QAM   . NONFORMULARY OR COMPOUNDED ITEM See pharmacy note (Patient not taking: Reported on 08/28/2018)   . OLANZapine (ZYPREXA) 10 MG tablet Take 10 mg by mouth at bedtime.   . sodium chloride (OCEAN) 0.65 % nasal spray Place 2 sprays into the nose 2 (two) times  daily.   . [DISCONTINUED] ARIPiprazole (ABILIFY) 15 MG tablet Take 10 mg by mouth at bedtime.     No facility-administered encounter medications on file as of 08/28/2018.     Surgical History: Past Surgical History:  Procedure Laterality Date  . COLONOSCOPY WITH PROPOFOL N/A 07/30/2015   Procedure: COLONOSCOPY WITH PROPOFOL;  Surgeon: [name], MD;  Location: [facility];  Service: Endoscopy;  Laterality: N/A;  . DERMABRASION OF FACE     due to acne scars  . distal radius fracture of right hand (plates, screws, and pins)  2011  . ESOPHAGOGASTRODUODENOSCOPY (EGD) WITH PROPOFOL N/A 07/30/2015   Procedure: ESOPHAGOGASTRODUODENOSCOPY (EGD) WITH PROPOFOL;  Surgeon: [name], MD;   Location: [facility];  Service: Endoscopy;  Laterality: N/A;  . EYE SURGERY     LASER + SURG BIL   . GAS/FLUID EXCHANGE Right 09/17/2013   Procedure: GAS/FLUID EXCHANGE;  Surgeon: Hayden Pedro, MD;  Location: [place];  Service: Ophthalmology;  Laterality: Right;  . gastric sleeve surgery  08/17/2016  . INTRAOCULAR LENS INSERTION Bilateral    lens disease due to homocysteinuria  . NASAL SEPTUM SURGERY  march 2016   @ [facility]  . PARS PLANA VITRECTOMY Right 09/17/2013   Procedure: PARS PLANA VITRECTOMY WITH 25G REMOVAL/SUTURE SECONDARY INTRAOCULAR LENS;  Surgeon: Hayden Pedro, MD;  Location: [place];  Service: Ophthalmology;  Laterality: Right;  . PHOTOCOAGULATION Right 09/17/2013   Procedure: PHOTOCOAGULATION;  Surgeon: Hayden Pedro, MD;  Location: [place];  Service: Ophthalmology;  Laterality: Right;  HEADSCOPE LASER  . scapholunate ligament reconstruction of right hand  2011  . UMBILICAL HERNIA REPAIR N/A 05/07/2015   Procedure: HERNIA REPAIR UMBILICAL ADULT;  Surgeon: Florene Glen, MD;  Location: [facility];  Service: General;  Laterality: N/A;  . WRIST SURGERY Right     Medical History: Past Medical History:  Diagnosis Date  . Anxiety   . Arthritis    knees,Right wrist  . Brain bleed ([place])   . Cataract    bilateral repair with lens implants  . Depression   . Family history of adverse reaction to anesthesia    n/v-mom  . GERD (gastroesophageal reflux disease)   . 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 Hypertension   . Lens disease   . Neuromuscular disorder ([place])   . Obesity, Class II, BMI 35-39.9   . OCD (obsessive compulsive disorder)   . Paresthesia 06/10/2015  . Sleep apnea   . Stroke [facility])    with brain bleed at age 52-no deficits  . Umbilical hernia     Family History: Family History  Problem Relation Age of Onset  . Breast cancer Mother   . Hypertension Father   . Stroke Maternal Grandmother   . Stroke Maternal Grandfather   . Stroke Paternal Grandmother   . Stroke  Paternal Grandfather   . Stroke Other     Social History: Social History   Socioeconomic History  . Marital status: Single    Spouse name: n/a  . Number of children: 0  . Years of education: college  . Highest education level: Not on file  Occupational History  . Occupation: unemployed/disabled    Comment: Lexicographer  Social Needs  . Financial resource strain: Not on file  . Food insecurity:    Worry: Not on file    Inability: Not on file  . Transportation needs:    Medical: Not on file    Non-medical: Not on file  Tobacco Use  .  Smoking status: Never Smoker  . Smokeless tobacco: Never Used  Substance and Sexual Activity  . Alcohol use: No    Alcohol/week: 0.0 standard drinks  . Drug use: No  . Sexual activity: Not on file  Lifestyle  . Physical activity:    Days per week: Not on file    Minutes per session: Not on file  . Stress: Not on file  Relationships  . Social connections:    Talks on phone: Not on file    Gets together: Not on file    Attends religious service: Not on file    Active member of club or organization: Not on file    Attends meetings of clubs or organizations: Not on file    Relationship status: Not on file  . Intimate partner violence:    Fear of current or ex partner: Not on file    Emotionally abused: Not on file    Physically abused: Not on file    Forced sexual activity: Not on file  Other Topics Concern  . Not on file  Social History Narrative   Lives alone. Parents live nearby.  Applying for disability (OCD) due to wrist injury, hearing upcoming.      Patient drinks 4-5 cups of caffeine daily.   Patient is right handed.    Vital Signs: Blood pressure 126/88, pulse 84, temperature 98.8 F (37.1 C), resp. rate 16, height 6\' 3"  (1.905 m), weight (!) 303 lb (137.4 kg), SpO2 93 %.  Examination: General Appearance: The patient is well-developed, well-nourished, and in no distress. Skin: Gross inspection of skin  unremarkable. Head: normocephalic, no gross deformities. Eyes: no gross deformities noted. ENT: ears appear grossly normal no exudates. Neck: Supple. No thyromegaly. No LAD. Respiratory: clear to auscultation bilateraly. Cardiovascular: Normal S1 and S2 without murmur or rub. Extremities: No cyanosis. pulses are equal. Neurologic: Alert and oriented. No involuntary movements.  LABS: No results found for this or any previous visit (from the past 2160 hour(s)).  Radiology: [facility] Aspiration/injection Loc  Result Date: 04/12/2017 CLINICAL DATA:  52 year old with male with left hip and leg pain. Request for a therapeutic left hip injection. EXAM: LEFT HIP INJECTION UNDER FLUOROSCOPY FLUOROSCOPY TIME:  Fluoroscopy Time:  1 minutes and 30 seconds Number of Acquired Spot Images: 2 PROCEDURE: The procedure was explained to the patient. The risks and benefits of the procedure were discussed and the patient's questions were addressed. Informed consent was obtained from the patient. The patient was placed supine on the fluoroscopic table. Left common femoral artery was localized with palpation. The left hip was localized with fluoroscopy. The overlying skin was prepped with chlorhexidine. Skin was anesthetized with 1% lidocaine. 22 gauge needle was directed onto the superolateral aspect of the left femoral head-neck junction. A small amount of yellow joint fluid was coming out of the needle hub. Approximately 3 mL of contrast was injected to confirm placement in the hip joint. A cocktail containing 80 mg of Depo-Medrol and 5 mL of .25% bupivacaine was injected into the left hip joint. Needle was removed without complication. IMPRESSION: Technically successful left hip injection under fluoroscopy. Electronically Signed   By: Markus Daft M.D.   On: 04/12/2017 14:21    No results found.  No results found.    Assessment and Plan: Patient Active Problem List   Diagnosis Date Noted  .  Arthritis 04/26/2018  . Arthritis of knee 04/26/2018  . Bilateral cataracts 04/26/2018  . Class 1 obesity  04/26/2018  . Disorder of lens 04/26/2018  . Exomphalos 04/26/2018  . OSA on CPAP 04/26/2018  . Prediabetes 04/26/2018  . History of total hip arthroplasty 07/05/2017  . Spinal stenosis of lumbar region 04/25/2017  . Trochanteric bursitis 02/27/2017  . S/P laparoscopic sleeve gastrectomy 08/17/2016  . Alcohol-induced mood disorder ([place])   . OCD (obsessive compulsive disorder)   . Paresthesia 06/10/2015  . Diplopia 08/15/2014  . Sinus tachycardia 08/15/2014  . CVA (cerebral infarction) 08/14/2014  . Neuropathy 04/04/2014  . Intraocular lens dislocation 09/12/2013  . DDD (degenerative disc disease), lumbosacral 07/04/2013  . HTN (hypertension) 07/04/2013  . Major depressive disorder, single episode 07/03/2009  . 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 HYPERLIPIDEMIA 05/28/2008  . ANXIETY 05/28/2008  . OBSESSIVE-COMPULSIVE DISORDER 05/28/2008   1. OSA on CPAP Continue to use CPAP as directed.  Will most likely need to overnight oximetry to get new oxygen for cpap.   2. Essential hypertension, benign Controlled, continue current therapy.    3. Nocturnal hypoxia - Pulse oximetry, overnight; Future  4. Dependence on nocturnal oxygen therapy - Pulse oximetry, overnight; Future  5. Flu vaccine need - Flu Vaccine MDCK QUAD PF  General Counseling: I have discussed the findings of the evaluation and examination with Glendell Docker.  I have also discussed any further diagnostic evaluation thatmay be needed or ordered today. Garan verbalizes understanding of the findings of todays visit. We also reviewed his medications today and discussed drug interactions and side effects including but not limited excessive drowsiness and altered mental states. We also discussed that there is always a risk not just to him but also people around him. he has been encouraged to call  the office with any questions or concerns that should arise related to todays visit.    Time spent: 25 This patient was seen by Orson Gear AGNP-C in Collaboration with Dr. Devona Konig as a part of collaborative care agreement.   I have personally obtained a history, examined the patient, evaluated laboratory and imaging results, formulated the assessment and plan and placed orders.    [name], MD [facility] Pulmonary and Critical Care Sleep medicine

## 2018-08-29 DIAGNOSIS — E7849 Other hyperlipidemia: Secondary | ICD-10-CM | POA: Diagnosis not present

## 2018-08-29 DIAGNOSIS — I10 Essential (primary) hypertension: Secondary | ICD-10-CM | POA: Diagnosis not present

## 2018-08-30 ENCOUNTER — Telehealth: Payer: Self-pay

## 2018-08-30 ENCOUNTER — Other Ambulatory Visit: Payer: Self-pay | Admitting: Internal Medicine

## 2018-08-30 DIAGNOSIS — I10 Essential (primary) hypertension: Secondary | ICD-10-CM

## 2018-08-30 LAB — T4, FREE: FREE T4: 1.11 ng/dL (ref 0.82–1.77)

## 2018-08-30 LAB — COMPREHENSIVE METABOLIC PANEL
ALT: 25 IU/L (ref 0–44)
AST: 24 IU/L (ref 0–40)
Albumin/Globulin Ratio: 1.4 (ref 1.2–2.2)
Albumin: 4.3 g/dL (ref 3.5–5.5)
Alkaline Phosphatase: 64 IU/L (ref 39–117)
BILIRUBIN TOTAL: 0.4 mg/dL (ref 0.0–1.2)
BUN/Creatinine Ratio: 13 (ref 9–20)
BUN: 11 mg/dL (ref 6–24)
CHLORIDE: 94 mmol/L — AB (ref 96–106)
CO2: 26 mmol/L (ref 20–29)
Calcium: 9.2 mg/dL (ref 8.7–10.2)
Creatinine, Ser: 0.82 mg/dL (ref 0.76–1.27)
GFR calc non Af Amer: 102 mL/min/{1.73_m2} (ref >59)
GFR, EST AFRICAN AMERICAN: 118 mL/min/{1.73_m2} (ref >59)
GLUCOSE: 87 mg/dL (ref 65–99)
Globulin, Total: 3.1 g/dL (ref 1.5–4.5)
Potassium: 4.8 mmol/L (ref 3.5–5.2)
Sodium: 134 mmol/L (ref 134–144)
TOTAL PROTEIN: 7.4 g/dL (ref 6.0–8.5)

## 2018-08-30 LAB — LIPID PANEL WITH LDL/HDL RATIO
CHOLESTEROL TOTAL: 198 mg/dL (ref 100–199)
HDL: 34 mg/dL — ABNORMAL LOW (ref >39)
LDL Calculated: 133 mg/dL — ABNORMAL HIGH (ref 0–99)
LDL/HDL RATIO: 3.9 ratio — AB (ref 0.0–3.6)
TRIGLYCERIDES: 157 mg/dL — AB (ref 0–149)
VLDL Cholesterol Cal: 31 mg/dL (ref 5–40)

## 2018-08-30 LAB — CBC WITH DIFFERENTIAL/PLATELET
Basophils Absolute: 0 10*3/uL (ref 0.0–0.2)
Basos: 0 %
EOS (ABSOLUTE): 0 10*3/uL (ref 0.0–0.4)
EOS: 1 %
Hematocrit: 40 % (ref 37.5–51.0)
Hemoglobin: 13.8 g/dL (ref 13.0–17.7)
Immature Grans (Abs): 0 10*3/uL (ref 0.0–0.1)
Immature Granulocytes: 0 %
Lymphocytes Absolute: 1.7 10*3/uL (ref 0.7–3.1)
Lymphs: 27 %
MCH: 30.9 pg (ref 26.6–33.0)
MCHC: 34.5 g/dL (ref 31.5–35.7)
MCV: 90 fL (ref 79–97)
MONOS ABS: 0.6 10*3/uL (ref 0.1–0.9)
Monocytes: 9 %
NEUTROS ABS: 4.1 10*3/uL (ref 1.4–7.0)
Neutrophils: 63 %
PLATELETS: 178 10*3/uL (ref 150–450)
RBC: 4.46 x10E6/uL (ref 4.14–5.80)
RDW: 13.8 % (ref 12.3–15.4)
WBC: 6.5 10*3/uL (ref 3.4–10.8)

## 2018-08-30 LAB — TSH: TSH: 1.24 u[IU]/mL (ref 0.450–4.500)

## 2018-08-31 ENCOUNTER — Encounter: Payer: Self-pay | Admitting: Internal Medicine

## 2018-08-31 ENCOUNTER — Telehealth: Payer: Self-pay

## 2018-08-31 NOTE — Progress Notes (Signed)
SCANNED IN ORDER FOR OVERNIGHT OXIMETRY DONE ON 08/28/18.

## 2018-08-31 NOTE — Telephone Encounter (Signed)
Cholesterol is improving, everything else looks good.

## 2018-08-31 NOTE — Telephone Encounter (Signed)
Pt advised for labs see other reminder

## 2018-08-31 NOTE — Telephone Encounter (Signed)
Faxed Overnight Oximetry Test to [facility]. Beth

## 2018-09-05 ENCOUNTER — Ambulatory Visit (INDEPENDENT_AMBULATORY_CARE_PROVIDER_SITE_OTHER): Payer: Medicare HMO

## 2018-09-05 DIAGNOSIS — G4733 Obstructive sleep apnea (adult) (pediatric): Secondary | ICD-10-CM

## 2018-09-05 NOTE — Progress Notes (Signed)
Did not do download today pt has been having problems. I checked machine. The pressure is correct at 12 cwp, but he has been struggling with getting water in tubing. I will take him a new climate line tubing to control condensation also he did not have a filter will take that also then try to get a download in a month.

## 2018-09-07 ENCOUNTER — Telehealth: Payer: Self-pay

## 2018-09-07 NOTE — Telephone Encounter (Signed)
Patient called and states [facility] would be dropping off his stuff at our office, as of Friday I have not seen anything dropped off and will save his information and discuss with kelly and [facility] next Wednesday, pt states he needed a new oxygen concentrator and a cpap, he needs to be seen again for pulmonary.Beth

## 2018-09-12 NOTE — Telephone Encounter (Signed)
Verified with [facility] and in order for patient to receive oxygen concentrator replacement patient will need cpap titration sleep study to get him approved, they are working on his cpap supply order. Beth

## 2018-09-14 ENCOUNTER — Encounter: Payer: Self-pay | Admitting: Internal Medicine

## 2018-09-14 NOTE — Progress Notes (Signed)
SCANNED IN OVERNIGHT PULSE OX STARTED ON 09/04/18.

## 2018-09-18 ENCOUNTER — Other Ambulatory Visit: Payer: Self-pay

## 2018-09-18 ENCOUNTER — Other Ambulatory Visit: Payer: Self-pay | Admitting: Adult Health

## 2018-09-18 ENCOUNTER — Ambulatory Visit: Payer: Medicare HMO | Admitting: Internal Medicine

## 2018-09-18 ENCOUNTER — Encounter: Payer: Self-pay | Admitting: Internal Medicine

## 2018-09-18 VITALS — BP 122/86 | HR 82 | Resp 16 | Ht 75.0 in | Wt 292.0 lb

## 2018-09-18 DIAGNOSIS — I10 Essential (primary) hypertension: Secondary | ICD-10-CM | POA: Diagnosis not present

## 2018-09-18 DIAGNOSIS — E161 Other hypoglycemia: Secondary | ICD-10-CM

## 2018-09-18 DIAGNOSIS — Z9981 Dependence on supplemental oxygen: Secondary | ICD-10-CM | POA: Diagnosis not present

## 2018-09-18 DIAGNOSIS — Z9989 Dependence on other enabling machines and devices: Secondary | ICD-10-CM

## 2018-09-18 DIAGNOSIS — G4733 Obstructive sleep apnea (adult) (pediatric): Secondary | ICD-10-CM | POA: Diagnosis not present

## 2018-09-18 MED ORDER — FUROSEMIDE 20 MG PO TABS: 20.0000 mg | ORAL_TABLET | Freq: Every morning | ORAL | 3 refills | Status: DC

## 2018-09-18 NOTE — Progress Notes (Signed)
[facility] [place],  [address]  Pulmonary Sleep Medicine   Office Visit Note  Patient Name: Jesus Crosby. DOB: 12/12/1965 MRN [id number]  Date of Service: 09/18/2018  Complaints/HPI: Pt here for follow up on his overnight oximetry.  He spent 379 minutes with oxygen saturations below 88%.  He had oxygen combined into his CPAP but the machine has broken.  He will need to have a new machine or his old one repaired, as evidence by his oximetry results.  Pt reports he continues to wear his CPAP at night.  He denies issues with the seal.      ROS  General: (-) fever, (-) chills, (-) night sweats, (-) weakness Skin: (-) rashes, (-) itching,. Eyes: (-) visual changes, (-) redness, (-) itching. Nose and Sinuses: (-) nasal stuffiness or itchiness, (-) postnasal drip, (-) nosebleeds, (-) sinus trouble. Mouth and Throat: (-) sore throat, (-) hoarseness. Neck: (-) swollen glands, (-) enlarged thyroid, (-) neck pain. Respiratory: - cough, (-) bloody sputum, - shortness of breath, - wheezing. Cardiovascular: - ankle swelling, (-) chest pain. Lymphatic: (-) lymph node enlargement. Neurologic: (-) numbness, (-) tingling. Psychiatric: (-) anxiety, (-) depression   Current Medication: Outpatient Encounter Medications as of 09/18/2018  Medication Sig Note  . ALPRAZolam (XANAX) 0.5 MG tablet Take 0.5 mg by mouth 3 (three) times daily. 03/25/2014: .   Marland Kitchen amLODipine (NORVASC) 5 MG tablet TAKE 1 TABLET(5 MG) BY MOUTH TWICE DAILY   . aspirin 81 MG chewable tablet Chew by mouth daily.   Marland Kitchen atorvastatin (LIPITOR) 10 MG tablet Take 1 tablet (10 mg total) by mouth daily.   . benztropine (COGENTIN) 2 MG tablet Take 2 mg by mouth 2 (two) times daily.   . Cyanocobalamin (VITAMIN B 12 PO) Take 1,000 mcg by mouth.   . dicyclomine (BENTYL) 20 MG tablet Take by mouth.   . DULoxetine (CYMBALTA) 30 MG capsule Take 30 mg by mouth daily.   . folic acid (FOLVITE) 191 MCG tablet  Take 400 mcg by mouth at bedtime. 3 tablets at bedtime.   . furosemide (LASIX) 20 MG tablet Take 1 tablet (20 mg total) by mouth every morning.   . gabapentin (NEURONTIN) 600 MG tablet Take 600 mg by mouth 4 (four) times daily.    Marland Kitchen losartan (COZAAR) 50 MG tablet Take half tab po qd   . metoprolol tartrate (LOPRESSOR) 50 MG tablet Take 1.5 tablets (75 mg total) by mouth 2 (two) times daily.   . naltrexone (DEPADE) 50 MG tablet TK 1 T PO QAM   . NONFORMULARY OR COMPOUNDED ITEM See pharmacy note   . OLANZapine (ZYPREXA) 10 MG tablet Take 10 mg by mouth at bedtime.   Marland Kitchen omeprazole (PRILOSEC) 20 MG capsule Take 1 capsule (20 mg total) by mouth daily.   . ondansetron (ZOFRAN) 4 MG tablet TK 1 T PO Q 6 TO 8 H PRN N   . pregabalin (LYRICA) 150 MG capsule Take 150 mg by mouth 2 (two) times daily.   Marland Kitchen pyridOXINE (VITAMIN B-6) 100 MG tablet Take 500 mg by mouth at bedtime.    . sertraline (ZOLOFT) 100 MG tablet Take 100 mg by mouth every morning. 3 tablets in morning.   . traZODone (DESYREL) 100 MG tablet Take 200 mg by mouth at bedtime.   . sodium chloride (OCEAN) 0.65 % nasal spray Place 2 sprays into the nose 2 (two) times daily.    No facility-administered encounter medications on file as  of 09/18/2018.     Surgical History: Past Surgical History:  Procedure Laterality Date  . COLONOSCOPY WITH PROPOFOL N/A 07/30/2015   Procedure: COLONOSCOPY WITH PROPOFOL;  Surgeon: [name], MD;  Location: [facility];  Service: Endoscopy;  Laterality: N/A;  . DERMABRASION OF FACE     due to acne scars  . distal radius fracture of right hand (plates, screws, and pins)  2011  . ESOPHAGOGASTRODUODENOSCOPY (EGD) WITH PROPOFOL N/A 07/30/2015   Procedure: ESOPHAGOGASTRODUODENOSCOPY (EGD) WITH PROPOFOL;  Surgeon: [name], MD;  Location: [facility];  Service: Endoscopy;  Laterality: N/A;  . EYE SURGERY     LASER + SURG BIL   . GAS/FLUID EXCHANGE Right 09/17/2013   Procedure: GAS/FLUID EXCHANGE;   Surgeon: Hayden Pedro, MD;  Location: [place];  Service: Ophthalmology;  Laterality: Right;  . gastric sleeve surgery  08/17/2016  . INTRAOCULAR LENS INSERTION Bilateral    lens disease due to homocysteinuria  . NASAL SEPTUM SURGERY  march 2016   @ [facility]  . PARS PLANA VITRECTOMY Right 09/17/2013   Procedure: PARS PLANA VITRECTOMY WITH 25G REMOVAL/SUTURE SECONDARY INTRAOCULAR LENS;  Surgeon: Hayden Pedro, MD;  Location: [place];  Service: Ophthalmology;  Laterality: Right;  . PHOTOCOAGULATION Right 09/17/2013   Procedure: PHOTOCOAGULATION;  Surgeon: Hayden Pedro, MD;  Location: [place];  Service: Ophthalmology;  Laterality: Right;  HEADSCOPE LASER  . scapholunate ligament reconstruction of right hand  2011  . UMBILICAL HERNIA REPAIR N/A 05/07/2015   Procedure: HERNIA REPAIR UMBILICAL ADULT;  Surgeon: Florene Glen, MD;  Location: [facility];  Service: General;  Laterality: N/A;  . WRIST SURGERY Right     Medical History: Past Medical History:  Diagnosis Date  . Anxiety   . Arthritis    knees,Right wrist  . Brain bleed ([place])   . Cataract    bilateral repair with lens implants  . Depression   . Family history of adverse reaction to anesthesia    n/v-mom  . GERD (gastroesophageal reflux disease)   . 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 Hypertension   . Lens disease   . Neuromuscular disorder ([place])   . Obesity, Class II, BMI 35-39.9   . OCD (obsessive compulsive disorder)   . Paresthesia 06/10/2015  . Sleep apnea   . Stroke [facility])    with brain bleed at age 39-no deficits  . Umbilical hernia     Family History: Family History  Problem Relation Age of Onset  . Breast cancer Mother   . Hypertension Father   . Stroke Maternal Grandmother   . Stroke Maternal Grandfather   . Stroke Paternal Grandmother   . Stroke Paternal Grandfather   . Stroke Other     Social History: Social History   Socioeconomic History  . Marital status: Single    Spouse name: n/a  . Number of children: 0  .  Years of education: college  . Highest education level: Not on file  Occupational History  . Occupation: unemployed/disabled    Comment: Lexicographer  Social Needs  . Financial resource strain: Not on file  . Food insecurity:    Worry: Not on file    Inability: Not on file  . Transportation needs:    Medical: Not on file    Non-medical: Not on file  Tobacco Use  . Smoking status: Never Smoker  . Smokeless tobacco: Never Used  Substance and Sexual Activity  . Alcohol use: No    Alcohol/week: 0.0 standard drinks  .  Drug use: No  . Sexual activity: Not on file  Lifestyle  . Physical activity:    Days per week: Not on file    Minutes per session: Not on file  . Stress: Not on file  Relationships  . Social connections:    Talks on phone: Not on file    Gets together: Not on file    Attends religious service: Not on file    Active member of club or organization: Not on file    Attends meetings of clubs or organizations: Not on file    Relationship status: Not on file  . Intimate partner violence:    Fear of current or ex partner: Not on file    Emotionally abused: Not on file    Physically abused: Not on file    Forced sexual activity: Not on file  Other Topics Concern  . Not on file  Social History Narrative   Lives alone. Parents live nearby.  Applying for disability (OCD) due to wrist injury, hearing upcoming.      Patient drinks 4-5 cups of caffeine daily.   Patient is right handed.    Vital Signs: Blood pressure 122/86, pulse 82, resp. rate 16, height 6\' 3"  (1.905 m), weight 292 lb (132.5 kg), SpO2 93 %.  Examination: General Appearance: The patient is well-developed, well-nourished, and in no distress. Skin: Gross inspection of skin unremarkable. Head: normocephalic, no gross deformities. Eyes: no gross deformities noted. ENT: ears appear grossly normal no exudates. Neck: Supple. No thyromegaly. No LAD. Respiratory: Clear to auscultation  bilateraly. Cardiovascular: Normal S1 and S2 without murmur or rub. Extremities: No cyanosis. pulses are equal. Neurologic: Alert and oriented. No involuntary movements.  LABS: Recent Results (from the past 2160 hour(s))  CBC with Differential/Platelet     Status: None   Collection Time: 08/29/18 11:21 AM  Result Value Ref Range   WBC 6.5 3.4 - 10.8 x10E3/uL   RBC 4.46 4.14 - 5.80 x10E6/uL   Hemoglobin 13.8 13.0 - 17.7 g/dL   Hematocrit 40.0 37.5 - 51.0 %   MCV 90 79 - 97 fL   MCH 30.9 26.6 - 33.0 pg   MCHC 34.5 31.5 - 35.7 g/dL   RDW 13.8 12.3 - 15.4 %   Platelets 178 150 - 450 x10E3/uL   Neutrophils 63 Not Estab. %   Lymphs 27 Not Estab. %   Monocytes 9 Not Estab. %   Eos 1 Not Estab. %   Basos 0 Not Estab. %   Neutrophils Absolute 4.1 1.4 - 7.0 x10E3/uL   Lymphocytes Absolute 1.7 0.7 - 3.1 x10E3/uL   Monocytes Absolute 0.6 0.1 - 0.9 x10E3/uL   EOS (ABSOLUTE) 0.0 0.0 - 0.4 x10E3/uL   Basophils Absolute 0.0 0.0 - 0.2 x10E3/uL   Immature Granulocytes 0 Not Estab. %   Immature Grans (Abs) 0.0 0.0 - 0.1 x10E3/uL  Lipid Panel With LDL/HDL Ratio     Status: Abnormal   Collection Time: 08/29/18 11:21 AM  Result Value Ref Range   Cholesterol, Total 198 100 - 199 mg/dL   Triglycerides 157 (H) 0 - 149 mg/dL   HDL 34 (L) >39 mg/dL   VLDL Cholesterol Cal 31 5 - 40 mg/dL   LDL Calculated 133 (H) 0 - 99 mg/dL   LDl/HDL Ratio 3.9 (H) 0.0 - 3.6 ratio    Comment:  LDL/HDL Ratio                                             Men  Women                               1/2 Avg.Risk  1.0    1.5                                   Avg.Risk  3.6    3.2                                2X Avg.Risk  6.2    5.0                                3X Avg.Risk  8.0    6.1   TSH     Status: None   Collection Time: 08/29/18 11:21 AM  Result Value Ref Range   TSH 1.240 0.450 - 4.500 uIU/mL  T4, free     Status: None   Collection Time: 08/29/18 11:21 AM  Result Value Ref  Range   Free T4 1.11 0.82 - 1.77 ng/dL  Comprehensive metabolic panel     Status: Abnormal   Collection Time: 08/29/18 11:21 AM  Result Value Ref Range   Glucose 87 65 - 99 mg/dL   BUN 11 6 - 24 mg/dL   Creatinine, Ser 0.82 0.76 - 1.27 mg/dL   GFR calc non Af Amer 102 >59 mL/min/1.73   GFR calc Af Amer 118 >59 mL/min/1.73   BUN/Creatinine Ratio 13 9 - 20   Sodium 134 134 - 144 mmol/L   Potassium 4.8 3.5 - 5.2 mmol/L   Chloride 94 (L) 96 - 106 mmol/L   CO2 26 20 - 29 mmol/L   Calcium 9.2 8.7 - 10.2 mg/dL   Total Protein 7.4 6.0 - 8.5 g/dL   Albumin 4.3 3.5 - 5.5 g/dL   Globulin, Total 3.1 1.5 - 4.5 g/dL   Albumin/Globulin Ratio 1.4 1.2 - 2.2   Bilirubin Total 0.4 0.0 - 1.2 mg/dL   Alkaline Phosphatase 64 39 - 117 IU/L   AST 24 0 - 40 IU/L   ALT 25 0 - 44 IU/L    Radiology: Dg Fluoro Guided Needle Plc Aspiration/injection Loc  Result Date: 04/12/2017 CLINICAL DATA:  52 year old with male with left hip and leg pain. Request for a therapeutic left hip injection. EXAM: LEFT HIP INJECTION UNDER FLUOROSCOPY FLUOROSCOPY TIME:  Fluoroscopy Time:  1 minutes and 30 seconds Number of Acquired Spot Images: 2 PROCEDURE: The procedure was explained to the patient. The risks and benefits of the procedure were discussed and the patient's questions were addressed. Informed consent was obtained from the patient. The patient was placed supine on the fluoroscopic table. Left common femoral artery was localized with palpation. The left hip was localized with fluoroscopy. The overlying skin was prepped with chlorhexidine. Skin was anesthetized with 1% lidocaine. 22 gauge needle was directed onto the superolateral aspect of the left femoral head-neck junction. A small amount of yellow joint fluid was coming out of the  needle hub. Approximately 3 mL of contrast was injected to confirm placement in the hip joint. A cocktail containing 80 mg of Depo-Medrol and 5 mL of .25% bupivacaine was injected into the left hip  joint. Needle was removed without complication. IMPRESSION: Technically successful left hip injection under fluoroscopy. Electronically Signed   By: Markus Daft M.D.   On: 04/12/2017 14:21    No results found.  No results found.    Assessment and Plan: Patient Active Problem List   Diagnosis Date Noted  . Arthritis 04/26/2018  . Arthritis of knee 04/26/2018  . Bilateral cataracts 04/26/2018  . Class 1 obesity 04/26/2018  . Disorder of lens 04/26/2018  . Exomphalos 04/26/2018  . OSA on CPAP 04/26/2018  . Prediabetes 04/26/2018  . History of total hip arthroplasty 07/05/2017  . Spinal stenosis of lumbar region 04/25/2017  . Trochanteric bursitis 02/27/2017  . S/P laparoscopic sleeve gastrectomy 08/17/2016  . Alcohol-induced mood disorder ([place])   . OCD (obsessive compulsive disorder)   . Paresthesia 06/10/2015  . Diplopia 08/15/2014  . Sinus tachycardia 08/15/2014  . CVA (cerebral infarction) 08/14/2014  . Neuropathy 04/04/2014  . Intraocular lens dislocation 09/12/2013  . DDD (degenerative disc disease), lumbosacral 07/04/2013  . HTN (hypertension) 07/04/2013  . Major depressive disorder, single episode 07/03/2009  . 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 HYPERLIPIDEMIA 05/28/2008  . ANXIETY 05/28/2008  . OBSESSIVE-COMPULSIVE DISORDER 05/28/2008   1. OSA on CPAP Discussed continued compliance with CPAP.  Will order in-line oxygen for his CPAP.   2. Essential hypertension, benign Stable, continue current treatment.  3. Nocturnal hypoglycemia Pts overnight oximetry shows 379 minutes with saturation below 88%, he will need oxygen through his CPAP as before.   4. Oxygen dependent Continue to use 2 Liters of oxygen at night, once new machine is delivered.   General Counseling: I have discussed the findings of the evaluation and examination with Jesus Crosby.  I have also discussed any further diagnostic evaluation thatmay be needed or ordered today.  Jesus Crosby verbalizes understanding of the findings of todays visit. We also reviewed his medications today and discussed drug interactions and side effects including but not limited excessive drowsiness and altered mental states. We also discussed that there is always a risk not just to him but also people around him. he has been encouraged to call the office with any questions or concerns that should arise related to todays visit.    Time spent: 25 This patient was seen by Orson Gear AGNP-C in Collaboration with Dr. Devona Konig as a part of collaborative care agreement.  I have personally obtained a history, examined the patient, evaluated laboratory and imaging results, formulated the assessment and plan and placed orders.    [name], MD [facility] Sleep medicine

## 2018-09-18 NOTE — Patient Instructions (Signed)
Hypoxia Hypoxia is a condition that happens when there is a lack of oxygen in the body's tissues and organs. When there is not enough oxygen, organs cannot work as they should. This causes serious problems throughout the body and in the brain. What are the causes? This condition may be caused by:  Exposure to high altitude.  A collapsed lung (pneumothorax).  Lung infection (pneumonia).  Lung injury.  Long-term (chronic) lung disease, such as COPD (chronic obstructive pulmonary disease).  Blood collecting in the chest cavity (hemothorax).  Food, saliva, or vomit getting into the airway (aspiration).  Reduced blood flow (ischemia).  Severe blood loss.  Slow or shallow breathing (hypoventilation).  Blood disorders, such as anemia.  Carbon monoxide poisoning.  The heart suddenly stopping (cardiac arrest).  Anesthetic medicines.  Drowning.  Choking.  What are the signs or symptoms? Symptoms of this condition include:  Headache.  Fatigue.  Drowsiness.  Forgetfulness.  Nausea.  Confusion.  Shortness of breath.  Dizziness.  Bluish color of the skin, lips, or nail beds (cyanosis).  Change in consciousness or awareness.  If hypoxia is not treated, it can lead to convulsions, loss of consciousness (coma), or brain damage. How is this diagnosed? This condition may be diagnosed based on:  A physical exam.  Blood tests.  A test that measures how much oxygen is in your blood (pulse oximetry). This is done with a sensor that is placed on your finger, toe, or earlobe.  Chest X-ray.  Tests to check your lung function (pulmonary function tests).  A test to check the electrical activity of your heart (electrocardiogram, ECG).  You may have other tests to determine the cause of your hypoxia. How is this treated? Treatment for this condition depends on what is causing the hypoxia. You will likely be treated with oxygen therapy. This may be done by giving you  oxygen through a face mask or through tubes in your nose. Your health care provider may also recommend other therapies to treat the underlying cause of your hypoxia. Follow these instructions at home:  Take over-the-counter and prescription medicines only as told by your health care provider.  Do not use any products that contain nicotine or tobacco, such as cigarettes and e-cigarettes. If you need help quitting, ask your health care provider.  Avoid secondhand smoke.  Work with your health care provider to manage any chronic conditions you have that may be causing hypoxia, such as COPD.  Keep all follow-up visits as told by your health care provider. This is important. Contact a health care provider if:  You have a fever.  You have trouble breathing, even after treatment.  You become extremely short of breath when you exercise. Get help right away if:  Your shortness of breath gets worse, especially with normal or very little activity.  Your skin, lips, or nail beds have a bluish color.  You become confused or you cannot think properly.  You have chest pain. Summary  Hypoxia is a condition that happens when there is a lack of oxygen in the body's tissues and organs.  If hypoxia is not treated, it can lead to convulsions, loss of consciousness (coma), or brain damage.  Symptoms of hypoxia can include a headache, shortness of breath, confusion, nausea, and a bluish skin color.  Hypoxia has many possible causes, including exposure to high altitude, carbon monoxide poisoning, or other health issues, such as blood disorders or cardiac arrest.  Hypoxia is usually treated with oxygen therapy. This information   is not intended to replace advice given to you by your health care provider. Make sure you discuss any questions you have with your health care provider. Document Released: 01/09/2017 Document Revised: 01/09/2017 Document Reviewed: 01/09/2017 Elsevier Interactive Patient  Education  2018 [employer] Inc.  

## 2018-09-19 ENCOUNTER — Encounter: Payer: Self-pay | Admitting: Adult Health

## 2018-09-19 ENCOUNTER — Ambulatory Visit (INDEPENDENT_AMBULATORY_CARE_PROVIDER_SITE_OTHER): Payer: Medicare HMO | Admitting: Adult Health

## 2018-09-19 VITALS — BP 118/84 | HR 90 | Temp 98.7°F | Resp 16 | Ht 75.0 in | Wt 292.0 lb

## 2018-09-19 DIAGNOSIS — R197 Diarrhea, unspecified: Secondary | ICD-10-CM | POA: Diagnosis not present

## 2018-09-19 DIAGNOSIS — R109 Unspecified abdominal pain: Secondary | ICD-10-CM | POA: Diagnosis not present

## 2018-09-19 DIAGNOSIS — K625 Hemorrhage of anus and rectum: Secondary | ICD-10-CM

## 2018-09-19 DIAGNOSIS — K909 Intestinal malabsorption, unspecified: Secondary | ICD-10-CM

## 2018-09-19 NOTE — Patient Instructions (Signed)
Abdominal Bloating °When you have abdominal bloating, your abdomen may feel full, tight, or painful. It may also look bigger than normal or swollen (distended). Common causes of abdominal bloating include: °· Swallowing air. °· Constipation. °· Problems digesting food. °· Eating too much. °· Irritable bowel syndrome. This is a condition that affects the large intestine. °· Lactose intolerance. This is an inability to digest lactose, a natural sugar in dairy products. °· Celiac disease. This is a condition that affects the ability to digest gluten, a protein found in some grains. °· Gastroparesis. This is a condition that slows down the movement of food in the stomach and small intestine. It is more common in people with diabetes mellitus. °· Gastroesophageal reflux disease (GERD). This is a digestive condition that makes stomach acid flow back into the esophagus. °· Urinary retention. This means that the body is holding onto urine, and the bladder cannot be emptied all the way. ° °Follow these instructions at home: °Eating and drinking °· Avoid eating too much. °· Try not to swallow air while talking or eating. °· Avoid eating while lying down. °· Avoid these foods and drinks: °? Foods that cause gas, such as broccoli, cabbage, cauliflower, and baked beans. °? Carbonated drinks. °? Hard candy. °? Chewing gum. °Medicines °· Take over-the-counter and prescription medicines only as told by your health care provider. °· Take probiotic medicines. These medicines contain live bacteria or yeasts that can help digestion. °· Take coated peppermint oil capsules. °Activity °· Try to exercise regularly. Exercise may help to relieve bloating that is caused by gas and relieve constipation. °General instructions °· Keep all follow-up visits as told by your health care provider. This is important. °Contact a health care provider if: °· You have nausea and vomiting. °· You have diarrhea. °· You have abdominal pain. °· You have  unusual weight loss or weight gain. °· You have severe pain, and medicines do not help. °Get help right away if: °· You have severe chest pain. °· You have trouble breathing. °· You have shortness of breath. °· You have trouble urinating. °· You have darker urine than normal. °· You have blood in your stools or have dark, tarry stools. °Summary °· Abdominal bloating means that the abdomen is swollen. °· Common causes of abdominal bloating are swallowing air, constipation, and problems digesting food. °· Avoid eating too much and avoid swallowing air. °· Avoid foods that cause gas, carbonated drinks, hard candy, and chewing gum. °This information is not intended to replace advice given to you by your health care provider. Make sure you discuss any questions you have with your health care provider. °Document Released: 12/23/2016 Document Revised: 12/23/2016 Document Reviewed: 12/23/2016 °[employer]vier Interactive Patient Education © 2018 [employer] ° °

## 2018-09-19 NOTE — Progress Notes (Signed)
[facility] [place], [place] [address]  Internal MEDICINE  Office Visit Note  Patient Name: Jesus Crosby  [id number]  [id number]  Date of Service: 09/19/2018  Chief Complaint  Patient presents with  . Abdominal Pain    painful and diarrhea      HPI Pt is here for a sick visit. He reports abdomina pain, and diarrhea x a couple months.  He also reports intermittent rectal bleeding.  He reports it is enough to run down his leg.  It is always associated with bowel movements.  He had a colonoscopy a few years ago and the GI doctor though he had Ulcerative colitis. He was told he did not have it after the lab reviewed the samples.  He was also told he did not have hemorrhoids at that time. He also reports for 6 months he was heavily drinking alcohol.  He has stopped that 3 months ago, but he is concerned that some damage may have been done.      Current Medication:  Outpatient Encounter Medications as of 09/19/2018  Medication Sig Note  . ALPRAZolam (XANAX) 0.5 MG tablet Take 0.5 mg by mouth 3 (three) times daily. 03/25/2014: .   Marland Kitchen amLODipine (NORVASC) 5 MG tablet TAKE 1 TABLET(5 MG) BY MOUTH TWICE DAILY   . aspirin 81 MG chewable tablet Chew by mouth daily.   Marland Kitchen atorvastatin (LIPITOR) 10 MG tablet Take 1 tablet (10 mg total) by mouth daily.   . benztropine (COGENTIN) 2 MG tablet Take 2 mg by mouth 2 (two) times daily.   . Cyanocobalamin (VITAMIN B 12 PO) Take 1,000 mcg by mouth.   . dicyclomine (BENTYL) 20 MG tablet Take by mouth.   . DULoxetine (CYMBALTA) 30 MG capsule Take 30 mg by mouth daily.   . folic acid (FOLVITE) 619 MCG tablet Take 400 mcg by mouth at bedtime. 3 tablets at bedtime.   . furosemide (LASIX) 20 MG tablet Take 1 tablet (20 mg total) by mouth every morning.   . gabapentin (NEURONTIN) 600 MG tablet Take 600 mg by mouth 4 (four) times daily.    Marland Kitchen losartan (COZAAR) 50 MG tablet Take half tab po qd   . metoprolol tartrate (LOPRESSOR) 50 MG  tablet Take 1.5 tablets (75 mg total) by mouth 2 (two) times daily.   . naltrexone (DEPADE) 50 MG tablet TK 1 T PO QAM   . NONFORMULARY OR COMPOUNDED ITEM See pharmacy note   . OLANZapine (ZYPREXA) 10 MG tablet Take 10 mg by mouth at bedtime.   Marland Kitchen omeprazole (PRILOSEC) 20 MG capsule Take 1 capsule (20 mg total) by mouth daily.   . ondansetron (ZOFRAN) 4 MG tablet TK 1 T PO Q 6 TO 8 H PRN N   . pregabalin (LYRICA) 150 MG capsule Take 150 mg by mouth 2 (two) times daily.   Marland Kitchen pyridOXINE (VITAMIN B-6) 100 MG tablet Take 500 mg by mouth at bedtime.    . sertraline (ZOLOFT) 100 MG tablet Take 100 mg by mouth every morning. 3 tablets in morning.   . sodium chloride (OCEAN) 0.65 % nasal spray Place 2 sprays into the nose 2 (two) times daily.   . traZODone (DESYREL) 100 MG tablet Take 200 mg by mouth at bedtime.    No facility-administered encounter medications on file as of 09/19/2018.       Medical History: Past Medical History:  Diagnosis Date  . Anxiety   . Arthritis    knees,Right  wrist  . Brain bleed ([place])   . Cataract    bilateral repair with lens implants  . Depression   . Family history of adverse reaction to anesthesia    n/v-mom  . GERD (gastroesophageal reflux disease)   . Homocystinuria ([place])   . Hypertension   . Lens disease   . Neuromuscular disorder ([place])   . Obesity, Class II, BMI 35-39.9   . OCD (obsessive compulsive disorder)   . Paresthesia 06/10/2015  . Sleep apnea   . Stroke [facility])    with brain bleed at age 59-no deficits  . Umbilical hernia      Vital Signs: BP 118/84   Pulse 90   Temp 98.7 F (37.1 C)   Resp 16   Ht 6\' 3"  (1.905 m)   Wt 292 lb (132.5 kg)   SpO2 95%   BMI 36.50 kg/m    Review of Systems  Constitutional: Negative.  Negative for chills, fatigue and unexpected weight change.  HENT: Negative.  Negative for congestion, rhinorrhea, sneezing and sore throat.   Eyes: Negative for redness.  Respiratory: Negative.  Negative for cough,  chest tightness and shortness of breath.   Cardiovascular: Negative.  Negative for chest pain and palpitations.  Gastrointestinal: Positive for abdominal pain, anal bleeding and diarrhea. Negative for constipation, nausea and vomiting.  Endocrine: Negative.   Genitourinary: Negative.  Negative for dysuria and frequency.  Musculoskeletal: Negative.  Negative for arthralgias, back pain, joint swelling and neck pain.  Skin: Negative.  Negative for rash.  Allergic/Immunologic: Negative.   Neurological: Negative.  Negative for tremors and numbness.  Hematological: Negative for adenopathy. Does not bruise/bleed easily.  Psychiatric/Behavioral: Negative.  Negative for behavioral problems, sleep disturbance and suicidal ideas. The patient is not nervous/anxious.     Physical Exam  Constitutional: He is oriented to person, place, and time. He appears well-developed and well-nourished. No distress.  HENT:  Head: Normocephalic and atraumatic.  Mouth/Throat: Oropharynx is clear and moist. No oropharyngeal exudate.  Eyes: Pupils are equal, round, and reactive to light. EOM are normal.  Neck: Normal range of motion. Neck supple. No JVD present. No tracheal deviation present. No thyromegaly present.  Cardiovascular: Normal rate, regular rhythm and normal heart sounds. Exam reveals no gallop and no friction rub.  No murmur heard. Pulmonary/Chest: Effort normal and breath sounds normal. No respiratory distress. He has no wheezes. He has no rales. He exhibits no tenderness.  Abdominal: Soft. Bowel sounds are normal. There is tenderness in the right upper quadrant. There is no guarding.  Diffuse non-specific abdominal pain.  Reproducible with palpation in RUQ.   Musculoskeletal: Normal range of motion.  Lymphadenopathy:    He has no cervical adenopathy.  Neurological: He is alert and oriented to person, place, and time. No cranial nerve deficit.  Skin: Skin is warm and dry. He is not diaphoretic.   Psychiatric: He has a normal mood and affect. His behavior is normal. Judgment and thought content normal.  Nursing note and vitals reviewed.  Assessment/Plan:  1. Abdominal discomfort Referral for gastroenterology, patient should see GI again as its been 3 years since colonoscopy. - Ambulatory referral to Gastroenterology - US Abdomen Complete; Future  2. Diarrhea due to malabsorption Patient had gastric bypass surgery and since has had issues with malabsorption and having diarrhea.  However, this recent bout of diarrhea he feels is different as it is accompanied by abdominal pain. - Ambulatory referral to Gastroenterology  3. Rectal bleeding Patient has not had rectal  bleeding a few weeks.  But states that he has a consistently every 3 to 4 weeks. - Ambulatory referral to Gastroenterology  General Counseling: lachlan mckim understanding of the findings of todays visit and agrees with plan of treatment. I have discussed any further diagnostic evaluation that may be needed or ordered today. We also reviewed his medications today. he has been encouraged to call the office with any questions or concerns that should arise related to todays visit.   Orders Placed This Encounter  Procedures  . US Abdomen Complete  . Ambulatory referral to Gastroenterology    No orders of the defined types were placed in this encounter.   Time spent: 25 Minutes  This patient was seen by Orson Gear AGNP-C in Collaboration with Dr [name] as a part of collaborative care agreement.  Kendell Bane AGNP-C Internal Medicine

## 2018-09-21 ENCOUNTER — Telehealth: Payer: Self-pay

## 2018-09-21 ENCOUNTER — Other Ambulatory Visit (INDEPENDENT_AMBULATORY_CARE_PROVIDER_SITE_OTHER): Payer: Medicare HMO

## 2018-09-21 ENCOUNTER — Encounter: Payer: Self-pay | Admitting: Physician Assistant

## 2018-09-21 ENCOUNTER — Ambulatory Visit: Payer: Medicare HMO | Admitting: Physician Assistant

## 2018-09-21 VITALS — BP 120/80 | HR 88 | Ht 75.0 in | Wt 285.8 lb

## 2018-09-21 DIAGNOSIS — R1084 Generalized abdominal pain: Secondary | ICD-10-CM | POA: Diagnosis not present

## 2018-09-21 DIAGNOSIS — R197 Diarrhea, unspecified: Secondary | ICD-10-CM | POA: Diagnosis not present

## 2018-09-21 DIAGNOSIS — R110 Nausea: Secondary | ICD-10-CM

## 2018-09-21 LAB — BASIC METABOLIC PANEL
BUN: 13 mg/dL (ref 6–23)
CHLORIDE: 94 meq/L — AB (ref 96–112)
CO2: 31 meq/L (ref 19–32)
CREATININE: 1.11 mg/dL (ref 0.40–1.50)
Calcium: 10.1 mg/dL (ref 8.4–10.5)
GFR: 73.96 mL/min (ref >60.00)
Glucose, Bld: 81 mg/dL (ref 70–99)
Potassium: 4.2 mEq/L (ref 3.5–5.1)
Sodium: 132 mEq/L — ABNORMAL LOW (ref 135–145)

## 2018-09-21 LAB — CBC WITH DIFFERENTIAL/PLATELET
BASOS PCT: 0.9 % (ref 0.0–3.0)
Basophils Absolute: 0.1 10*3/uL (ref 0.0–0.1)
Eosinophils Absolute: 0 10*3/uL (ref 0.0–0.7)
Eosinophils Relative: 0.4 % (ref 0.0–5.0)
HEMATOCRIT: 45.2 % (ref 39.0–52.0)
Hemoglobin: 15.5 g/dL (ref 13.0–17.0)
Lymphocytes Relative: 27.5 % (ref 12.0–46.0)
Lymphs Abs: 1.7 10*3/uL (ref 0.7–4.0)
MCHC: 34.4 g/dL (ref 30.0–36.0)
MCV: 89.1 fl (ref 78.0–100.0)
Monocytes Absolute: 0.7 10*3/uL (ref 0.1–1.0)
Monocytes Relative: 10.9 % (ref 3.0–12.0)
NEUTROS ABS: 3.7 10*3/uL (ref 1.4–7.7)
Neutrophils Relative %: 60.3 % (ref 43.0–77.0)
PLATELETS: 223 10*3/uL (ref 150.0–400.0)
RBC: 5.08 Mil/uL (ref 4.22–5.81)
RDW: 13.9 % (ref 11.5–15.5)
WBC: 6.2 10*3/uL (ref 4.0–10.5)

## 2018-09-21 LAB — C-REACTIVE PROTEIN: CRP: 0.2 mg/dL — ABNORMAL LOW (ref 0.5–20.0)

## 2018-09-21 MED ORDER — DICYCLOMINE HCL 10 MG PO CAPS: 10.0000 mg | ORAL_CAPSULE | Freq: Four times a day (QID) | ORAL | 0 refills | Status: DC | PRN

## 2018-09-21 NOTE — Telephone Encounter (Signed)
Pt called stating he has been on lyrica, he was put on lyrica, stopped taking it, now he started back taking it, he stated that the medication had been making him nauseous, he is having blurred vision and aggitated . Pt wanted to know how to taper off this medication, 150mg  twice daily is what he is currently taking, per Nira Conn pt is to take 150mg  1 time daily and gradually come off of the medication.

## 2018-09-21 NOTE — Patient Instructions (Signed)
Your provider has requested that you go to the basement level for lab work before leaving today. Press "B" on the elevator. The lab is located at the first door on the left as you exit the elevator. ________________________________________________________________________ Dennis Bast have been scheduled for a CT scan of the abdomen and pelvis at [facility] ([address]---this is in the same building as Press [facility]).   You are scheduled on 10/03/18 at 11:30 am. You should arrive 15 minutes prior to your appointment time for registration. Please follow the written instructions below on the day of your exam:  WARNING: IF YOU ARE ALLERGIC TO IODINE/X-RAY DYE, PLEASE NOTIFY RADIOLOGY IMMEDIATELY AT [phone]! YOU WILL BE GIVEN A 13 HOUR PREMEDICATION PREP.  1) Do not eat or drink anything after 7:30 am (4 hours prior to your test) 2) You have been given 2 bottles of oral contrast to drink. The solution may taste better if refrigerated, but do NOT add ice or any other liquid to this solution. Shake well before drinking.    Drink 1 bottle of contrast @ 9:30 am (2 hours prior to your exam)  Drink 1 bottle of contrast @ 10:30 am (1 hour prior to your exam)  You may take any medications as prescribed with a small amount of water, if necessary. If you take any of the following medications: METFORMIN, GLUCOPHAGE, GLUCOVANCE, AVANDAMET, RIOMET, FORTAMET, [place] MET, JANUMET, GLUMETZA or METAGLIP, you MAY be asked to HOLD this medication 48 hours AFTER the exam.  The purpose of you drinking the oral contrast is to aid in the visualization of your intestinal tract. The contrast solution may cause some diarrhea. Depending on your individual set of symptoms, you may also receive an intravenous injection of x-ray contrast/dye. Plan on being at [facility] for 30 minutes or longer, depending on the type of exam you are having performed.  This test typically takes 30-45 minutes to  complete.  If you have any questions regarding your exam or if you need to reschedule, you may call the CT department at [phone] between the hours of 8:00 am and 5:00 pm, Monday-Friday.  ________________________________________________________________________ Stay OFF Cymbalta. ________________________________________________________________________ We have sent the following medications to your pharmacy for you to pick up at your convenience: Bentyl 10 mg 1 tablet by mouth every 6 hours as needed for abdominal pain/cramping/diarrhea _________________________________________________________________________ Please purchase the following medications over the counter and take as directed: Imodium only if having multiple bowel movements per day. _________________________________________________________________________ If you are age 45 or older, your body mass index should be between 23-30. Your Body mass index is 35.72 kg/m. If this is out of the aforementioned range listed, please consider follow up with your Primary Care Provider.  If you are age 50 or younger, your body mass index should be between 19-25. Your Body mass index is 35.72 kg/m. If this is out of the aformentioned range listed, please consider follow up with your Primary Care Provider.

## 2018-09-21 NOTE — Progress Notes (Signed)
Subjective:    Patient ID: Jesus Crosby., male    DOB: 1966-05-26, 52 y.o.   MRN: [id number]  HPI  Jesus Crosby is a 52 year old white male known to Dr. Ardis Hughs who has had history of chronic diarrhea.  He was last seen here in August 2016, at that time with complaints of diarrhea over the previous year with 4-5 bowel movements per day.  He underwent colonoscopy after that visit with finding of mild nonspecific inflammation from the anus to 50 cm 1 2 mm polyp was removed.  Biopsies were done from the area of inflammation and were negative for clear evidence of microscopic colitis, though there was increase in lymphocytosis. He also had EGD at that time which was normal. Patient has history of hypertension, morbid obesity and is now status post gastric sleeve which was done in 2017.  He says he initially lost a lot of weight but has gained most of it back.  He is also status post prior CVA, sleep apnea history of EtOH abuse, anxiety and OCD disorder.  He has a chronic neuropathy and has history of homocystinuria. He says the diarrhea had settled down and he was generally just having 1-2 bowel movements per day over the past couple of years.  He says he was drinking very heavily earlier this year for about 8 months and stopped about 2 months ago and is now worried about what he may have done to his pancreas or bowel. Started on Cymbalta about a week ago and within 1 day was having severe diarrhea and abdominal cramping.  He tried taking Imodium which seemed to increase his cramping.  He was also feeling nauseated. He stopped the Cymbalta on Wednesday, 2 days ago. The day he had diarrhea again this morning with a couple of episodes but has not had any since.  He says when the diarrhea was bad over this past week he was going up to 10 times per day. He is also been tapering down off of Lyrica as he does not like how it makes him feel. No other new meds, or antibiotics.  Review of Systems Pertinent positive  and negative review of systems were noted in the above HPI section.  All other review of systems was otherwise negative.  Outpatient Encounter Medications as of 09/21/2018  Medication Sig  . ALPRAZolam (XANAX) 0.5 MG tablet Take 0.5 mg by mouth 3 (three) times daily.  Marland Kitchen amLODipine (NORVASC) 5 MG tablet TAKE 1 TABLET(5 MG) BY MOUTH TWICE DAILY  . aspirin 81 MG chewable tablet Chew by mouth daily.  Marland Kitchen atorvastatin (LIPITOR) 10 MG tablet Take 1 tablet (10 mg total) by mouth daily.  . Cyanocobalamin (VITAMIN B 12 PO) Take 1,000 mcg by mouth.  . folic acid (FOLVITE) 751 MCG tablet Take 400 mcg by mouth at bedtime. 3 tablets at bedtime.  . furosemide (LASIX) 20 MG tablet Take 1 tablet (20 mg total) by mouth every morning.  . gabapentin (NEURONTIN) 600 MG tablet Take 600 mg by mouth at bedtime.  Marland Kitchen losartan (COZAAR) 50 MG tablet Take half tab po qd  . metoprolol tartrate (LOPRESSOR) 50 MG tablet Take 1.5 tablets (75 mg total) by mouth 2 (two) times daily.  . NONFORMULARY OR COMPOUNDED ITEM See pharmacy note (Patient taking differently: See pharmacy note-- cystdane powder- 8 scoops- 3 times daily)  . ondansetron (ZOFRAN) 4 MG tablet TK 1 T PO Q 6 TO 8 H PRN N  . pregabalin (LYRICA) 150 MG capsule Take  150 mg by mouth 2 (two) times daily.  Marland Kitchen pyridOXINE (VITAMIN B-6) 100 MG tablet Take 500 mg by mouth at bedtime.   . sertraline (ZOLOFT) 100 MG tablet Take 150 mg by mouth daily.  . traZODone (DESYREL) 100 MG tablet Take 200 mg by mouth at bedtime.  . [DISCONTINUED] dicyclomine (BENTYL) 20 MG tablet Take by mouth.  . dicyclomine (BENTYL) 10 MG capsule Take 1 capsule (10 mg total) by mouth every 6 (six) hours as needed (abdominal pain, cramping or diarrhea).  . [DISCONTINUED] benztropine (COGENTIN) 2 MG tablet Take 2 mg by mouth 2 (two) times daily.  . [DISCONTINUED] DULoxetine (CYMBALTA) 30 MG capsule Take 30 mg by mouth daily.  . [DISCONTINUED] gabapentin (NEURONTIN) 600 MG tablet Take 600 mg by mouth 4  (four) times daily.   . [DISCONTINUED] naltrexone (DEPADE) 50 MG tablet TK 1 T PO QAM  . [DISCONTINUED] OLANZapine (ZYPREXA) 10 MG tablet Take 10 mg by mouth at bedtime.  . [DISCONTINUED] omeprazole (PRILOSEC) 20 MG capsule Take 1 capsule (20 mg total) by mouth daily.  . [DISCONTINUED] sertraline (ZOLOFT) 100 MG tablet Take 100 mg by mouth every morning. 3 tablets in morning.  . [DISCONTINUED] sodium chloride (OCEAN) 0.65 % nasal spray Place 2 sprays into the nose 2 (two) times daily.   No facility-administered encounter medications on file as of 09/21/2018.    Allergies  Allergen Reactions  . Pineapple Flavor Swelling    Lip swell  . Pineapple Swelling    Lips swelling  . Amoxicillin Nausea And Vomiting    REACTION: Nausea and vomiting  . Penicillins    Patient Active Problem List   Diagnosis Date Noted  . Arthritis 04/26/2018  . Arthritis of knee 04/26/2018  . Bilateral cataracts 04/26/2018  . Class 1 obesity 04/26/2018  . Disorder of lens 04/26/2018  . Exomphalos 04/26/2018  . OSA on CPAP 04/26/2018  . Prediabetes 04/26/2018  . History of total hip arthroplasty 07/05/2017  . Spinal stenosis of lumbar region 04/25/2017  . Trochanteric bursitis 02/27/2017  . S/P laparoscopic sleeve gastrectomy 08/17/2016  . Alcohol-induced mood disorder ([place])   . OCD (obsessive compulsive disorder)   . Paresthesia 06/10/2015  . Diplopia 08/15/2014  . Sinus tachycardia 08/15/2014  . CVA (cerebral infarction) 08/14/2014  . Neuropathy 04/04/2014  . Intraocular lens dislocation 09/12/2013  . DDD (degenerative disc disease), lumbosacral 07/04/2013  . HTN (hypertension) 07/04/2013  . Major depressive disorder, single episode 07/03/2009  . 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 HYPERLIPIDEMIA 05/28/2008  . ANXIETY 05/28/2008  . OBSESSIVE-COMPULSIVE DISORDER 05/28/2008   Social History   Socioeconomic History  . Marital status: Single    Spouse name: n/a  .  Number of children: 0  . Years of education: college  . Highest education level: Not on file  Occupational History  . Occupation: unemployed/disabled    Comment: Lexicographer  Social Needs  . Financial resource strain: Not on file  . Food insecurity:    Worry: Not on file    Inability: Not on file  . Transportation needs:    Medical: Not on file    Non-medical: Not on file  Tobacco Use  . Smoking status: Never Smoker  . Smokeless tobacco: Never Used  Substance and Sexual Activity  . Alcohol use: No    Alcohol/week: 0.0 standard drinks  . Drug use: No  . Sexual activity: Not on file  Lifestyle  . Physical activity:    Days per week: Not  on file    Minutes per session: Not on file  . Stress: Not on file  Relationships  . Social connections:    Talks on phone: Not on file    Gets together: Not on file    Attends religious service: Not on file    Active member of club or organization: Not on file    Attends meetings of clubs or organizations: Not on file    Relationship status: Not on file  . Intimate partner violence:    Fear of current or ex partner: Not on file    Emotionally abused: Not on file    Physically abused: Not on file    Forced sexual activity: Not on file  Other Topics Concern  . Not on file  Social History Narrative   Lives alone. Parents live nearby.  Applying for disability (OCD) due to wrist injury, hearing upcoming.      Patient drinks 4-5 cups of caffeine daily.   Patient is right handed.    Mr. Follett family history includes Breast cancer in his mother; Hypertension in his father; Stroke in his maternal grandfather, maternal grandmother, other, paternal grandfather, and paternal grandmother.      Objective:    Vitals:   09/21/18 1503  BP: 120/80  Pulse: 88    Physical Exam; well-developed older white male in no acute distress, pressure 120/80 pulse 88, height 6 foot 3, weight 285, BMI 35.7.  HEENT; nontraumatic normocephalic EOMI  PERRLA sclera anicteric oral mucosa moist, Cardiovascular; regular rate and rhythm with S1-S2, Pulmonary ;clear bilaterally, Abdomen ;obese, he has some rather generalized abdominal tenderness which is not focal there is no guarding or rebound bowel sounds are present, no palpable mass or hepatosplenomegaly.  Rectal ;exam not done, Extremities; no clubbing cyanosis or edema skin warm and dry, Neuropsych; alert and oriented, grossly nonfocal he ambulates with difficulty secondary to neuropathy mood and affect appropriate       Assessment & Plan:   #69 52 year old white male with acute exacerbation of diarrhea in setting of chronic mild diarrhea.  Patient had abrupt onset of symptoms about a week ago with up to 10-12 bowel movements per day after starting Cymbalta. He discontinued the Cymbalta 2 days ago, and diarrhea has decreased but has not resolved.  has also been tapering his dose of Lyrica  I suspect the acute exacerbation is of diarrhea is medication induced secondary to Cymbalta will continue to improve as this washes out of his system  He is complaining of generalized abdominal discomfort and cramping as well over the past couple of weeks.  Prior colonoscopy with no definite evidence of microscopic colitis but he did have some mild nonspecific inflammation from the anus to 50 cm in 2016.  #2 adenomatous colon polyps-will be due for follow-up colonoscopy 2021 3 status post gastric sleeve 2017 4 morbid obesity 5.  EtOH abuse 6.  Anxiety and OCD disorder 7.  Peripheral neuropathy 8.  Higher CVA 9.  History of homocystinuria  Plan; stop Imodium as this is been causing cramping Start Bentyl 10 mg 3 times daily as needed for abdominal discomfort/cramping and diarrhea CBC, C met, sed rate, CRP-other baseline labs were normal in September 2019 Schedule for CT of the abdomen and pelvis with contrast Patient is advised to stay off of Cymbalta Further plans pending his course over the next  couple of weeks and results of CT.    Terriona Horlacher Genia Harold PA-C 09/21/2018   Cc: Ronnell Freshwater, NP

## 2018-09-24 NOTE — Progress Notes (Signed)
I agree with the above note, plan 

## 2018-09-25 ENCOUNTER — Encounter: Payer: Self-pay | Admitting: Internal Medicine

## 2018-09-27 ENCOUNTER — Ambulatory Visit (INDEPENDENT_AMBULATORY_CARE_PROVIDER_SITE_OTHER)
Admission: RE | Admit: 2018-09-27 | Discharge: 2018-09-27 | Disposition: A | Discharge status: Home / Self Care | Payer: Medicare HMO | Source: Ambulatory Visit | Attending: Physician Assistant | Admitting: Physician Assistant

## 2018-09-27 DIAGNOSIS — R197 Diarrhea, unspecified: Secondary | ICD-10-CM

## 2018-09-27 DIAGNOSIS — R110 Nausea: Secondary | ICD-10-CM

## 2018-09-27 DIAGNOSIS — R1084 Generalized abdominal pain: Secondary | ICD-10-CM | POA: Diagnosis not present

## 2018-09-27 MED ORDER — IOPAMIDOL (ISOVUE-300) INJECTION 61%
100.0000 mL | Freq: Once | INTRAVENOUS | Status: AC | PRN
  Administered 2018-09-27: 100 mL via INTRAVENOUS

## 2018-09-28 ENCOUNTER — Telehealth: Payer: Self-pay | Admitting: Physician Assistant

## 2018-09-28 ENCOUNTER — Other Ambulatory Visit: Payer: Self-pay

## 2018-09-28 NOTE — Telephone Encounter (Signed)
Advised of labs. Advised CT results have not been reviewed.

## 2018-10-02 ENCOUNTER — Telehealth: Payer: Self-pay

## 2018-10-02 NOTE — Telephone Encounter (Signed)
Patient reports he is still having diarrhea. He asks if it could be caused by the Cystdane Powder he takes for "my rare clotting disorder."

## 2018-10-02 NOTE — Telephone Encounter (Signed)
Pt states is returning a call for results.

## 2018-10-02 NOTE — Telephone Encounter (Signed)
-----   Message from Alfredia Ferguson, PA-C sent at 10/01/2018 12:26 PM EDT ----- Please let pt know the CT scan looks good - no acute findings- or any findings to explain his pain or diarrhea- normal appearing gastric sleeve, he does have several liver cysts  Which are benign and unlikely to be related to his sxs. See how he is feeling - still having pain ?, diarrhea?

## 2018-10-02 NOTE — Telephone Encounter (Signed)
See the telephone note from today.

## 2018-10-03 ENCOUNTER — Inpatient Hospital Stay: Admission: RE | Admit: 2018-10-03 | Payer: Medicare HMO | Source: Ambulatory Visit

## 2018-10-03 NOTE — Telephone Encounter (Signed)
He is taking the Dicyclomine today. He can tell an improvement in his symptoms. He has had a normal bowel movement today. He is now wondering if he has IBS. States he becomes weak on the diarrhea days. We discussed methods for maintaining good hydration such as Pedialyte or sports drinks.

## 2018-10-03 NOTE — Telephone Encounter (Signed)
Patient calling back requesting to speak with nurse. Patient would not give a reason.

## 2018-10-03 NOTE — Telephone Encounter (Signed)
Glad the bentyl is helping - yes he may have IBS- also had thought at time of office visit that Cymbalta  Had caused diarrhea ,and possibly decreasing dose of lyrica may have been playing into sxs as well . I am not familiar with the powder he is taking for clotting disorder - unlikely to be culprit if has been on it  long term .  I would like him to see how he does over next month ,  If diarrhea persists or worsens we will nee to reevaluate and possibly consider Colonoscopy

## 2018-10-03 NOTE — Telephone Encounter (Signed)
Discussed in detail with the patient. He is leaning towards IBS. He is happy with the response he has to dicyclomine. He is aware of IBgard. Agrees to call us if he does not continue to improve or if he acutely worsens.

## 2018-10-04 ENCOUNTER — Encounter: Payer: Self-pay | Admitting: *Deleted

## 2018-10-04 ENCOUNTER — Ambulatory Visit: Payer: Medicare HMO | Admitting: Student in an Organized Health Care Education/Training Program

## 2018-10-04 ENCOUNTER — Encounter: Payer: Self-pay | Admitting: Student in an Organized Health Care Education/Training Program

## 2018-10-04 ENCOUNTER — Emergency Department: Payer: Medicare HMO

## 2018-10-04 ENCOUNTER — Observation Stay
Admission: EM | Admit: 2018-10-04 | Discharge: 2018-10-07 | Disposition: A | Discharge status: Home / Self Care | Payer: Medicare HMO | Attending: Internal Medicine | Admitting: Internal Medicine

## 2018-10-04 ENCOUNTER — Ambulatory Visit: Payer: Self-pay | Admitting: Internal Medicine

## 2018-10-04 ENCOUNTER — Other Ambulatory Visit: Payer: Self-pay

## 2018-10-04 ENCOUNTER — Inpatient Hospital Stay: Payer: Medicare HMO

## 2018-10-04 VITALS — BP 114/83 | HR 80 | Temp 98.5°F | Resp 18 | Ht 75.0 in | Wt 282.0 lb

## 2018-10-04 DIAGNOSIS — I471 Supraventricular tachycardia: Secondary | ICD-10-CM | POA: Insufficient documentation

## 2018-10-04 DIAGNOSIS — Z79899 Other long term (current) drug therapy: Secondary | ICD-10-CM | POA: Insufficient documentation

## 2018-10-04 DIAGNOSIS — H538 Other visual disturbances: Secondary | ICD-10-CM | POA: Diagnosis not present

## 2018-10-04 DIAGNOSIS — M5137 Other intervertebral disc degeneration, lumbosacral region: Secondary | ICD-10-CM

## 2018-10-04 DIAGNOSIS — F329 Major depressive disorder, single episode, unspecified: Secondary | ICD-10-CM

## 2018-10-04 DIAGNOSIS — Z8673 Personal history of transient ischemic attack (TIA), and cerebral infarction without residual deficits: Secondary | ICD-10-CM | POA: Diagnosis not present

## 2018-10-04 DIAGNOSIS — R479 Unspecified speech disturbances: Secondary | ICD-10-CM

## 2018-10-04 DIAGNOSIS — E669 Obesity, unspecified: Secondary | ICD-10-CM | POA: Diagnosis not present

## 2018-10-04 DIAGNOSIS — I672 Cerebral atherosclerosis: Secondary | ICD-10-CM | POA: Insufficient documentation

## 2018-10-04 DIAGNOSIS — K580 Irritable bowel syndrome with diarrhea: Secondary | ICD-10-CM | POA: Insufficient documentation

## 2018-10-04 DIAGNOSIS — Z8739 Personal history of other diseases of the musculoskeletal system and connective tissue: Secondary | ICD-10-CM | POA: Diagnosis not present

## 2018-10-04 DIAGNOSIS — R4701 Aphasia: Secondary | ICD-10-CM | POA: Diagnosis not present

## 2018-10-04 DIAGNOSIS — Z9989 Dependence on other enabling machines and devices: Secondary | ICD-10-CM

## 2018-10-04 DIAGNOSIS — G4700 Insomnia, unspecified: Secondary | ICD-10-CM | POA: Insufficient documentation

## 2018-10-04 DIAGNOSIS — E785 Hyperlipidemia, unspecified: Secondary | ICD-10-CM | POA: Diagnosis not present

## 2018-10-04 DIAGNOSIS — Z96649 Presence of unspecified artificial hip joint: Secondary | ICD-10-CM

## 2018-10-04 DIAGNOSIS — Z6835 Body mass index (BMI) 35.0-35.9, adult: Secondary | ICD-10-CM | POA: Diagnosis not present

## 2018-10-04 DIAGNOSIS — G629 Polyneuropathy, unspecified: Secondary | ICD-10-CM

## 2018-10-04 DIAGNOSIS — G2581 Restless legs syndrome: Secondary | ICD-10-CM | POA: Insufficient documentation

## 2018-10-04 DIAGNOSIS — F419 Anxiety disorder, unspecified: Secondary | ICD-10-CM | POA: Diagnosis not present

## 2018-10-04 DIAGNOSIS — Z96642 Presence of left artificial hip joint: Secondary | ICD-10-CM | POA: Diagnosis not present

## 2018-10-04 DIAGNOSIS — F429 Obsessive-compulsive disorder, unspecified: Secondary | ICD-10-CM

## 2018-10-04 DIAGNOSIS — I10 Essential (primary) hypertension: Secondary | ICD-10-CM | POA: Insufficient documentation

## 2018-10-04 DIAGNOSIS — M48061 Spinal stenosis, lumbar region without neurogenic claudication: Secondary | ICD-10-CM | POA: Diagnosis not present

## 2018-10-04 DIAGNOSIS — G4733 Obstructive sleep apnea (adult) (pediatric): Secondary | ICD-10-CM | POA: Insufficient documentation

## 2018-10-04 DIAGNOSIS — Z7982 Long term (current) use of aspirin: Secondary | ICD-10-CM | POA: Diagnosis not present

## 2018-10-04 DIAGNOSIS — E7211 Homocystinuria: Secondary | ICD-10-CM | POA: Diagnosis not present

## 2018-10-04 DIAGNOSIS — R4781 Slurred speech: Secondary | ICD-10-CM | POA: Diagnosis present

## 2018-10-04 LAB — GLUCOSE, CAPILLARY: GLUCOSE-CAPILLARY: 80 mg/dL (ref 70–99)

## 2018-10-04 LAB — COMPREHENSIVE METABOLIC PANEL
ALBUMIN: 4.7 g/dL (ref 3.5–5.0)
ALT: 56 U/L — ABNORMAL HIGH (ref 0–44)
AST: 57 U/L — AB (ref 15–41)
Alkaline Phosphatase: 54 U/L (ref 38–126)
Anion gap: 12 (ref 5–15)
BUN: 10 mg/dL (ref 6–20)
CO2: 29 mmol/L (ref 22–32)
Calcium: 9.5 mg/dL (ref 8.9–10.3)
Chloride: 92 mmol/L — ABNORMAL LOW (ref 98–111)
Creatinine, Ser: 1.1 mg/dL (ref 0.61–1.24)
GFR calc Af Amer: >60 mL/min (ref >60)
GLUCOSE: 83 mg/dL (ref 70–99)
Potassium: 4.4 mmol/L (ref 3.5–5.1)
SODIUM: 133 mmol/L — AB (ref 135–145)
Total Bilirubin: 0.9 mg/dL (ref 0.3–1.2)
Total Protein: 9 g/dL — ABNORMAL HIGH (ref 6.5–8.1)

## 2018-10-04 LAB — CBC
HCT: 42.8 % (ref 39.0–52.0)
Hemoglobin: 14.4 g/dL (ref 13.0–17.0)
MCH: 30.1 pg (ref 26.0–34.0)
MCHC: 33.6 g/dL (ref 30.0–36.0)
MCV: 89.4 fL (ref 80.0–100.0)
NRBC: 0 % (ref 0.0–0.2)
PLATELETS: 210 10*3/uL (ref 150–400)
RBC: 4.79 MIL/uL (ref 4.22–5.81)
RDW: 13.2 % (ref 11.5–15.5)
WBC: 7.5 10*3/uL (ref 4.0–10.5)

## 2018-10-04 LAB — PROTIME-INR
INR: 1.13
Prothrombin Time: 14.4 seconds (ref 11.4–15.2)

## 2018-10-04 LAB — DIFFERENTIAL
ABS IMMATURE GRANULOCYTES: 0.02 10*3/uL (ref 0.00–0.07)
BASOS ABS: 0 10*3/uL (ref 0.0–0.1)
BASOS PCT: 1 %
EOS ABS: 0.1 10*3/uL (ref 0.0–0.5)
Eosinophils Relative: 1 %
IMMATURE GRANULOCYTES: 0 %
Lymphocytes Relative: 38 %
Lymphs Abs: 2.9 10*3/uL (ref 0.7–4.0)
Monocytes Absolute: 0.7 10*3/uL (ref 0.1–1.0)
Monocytes Relative: 10 %
NEUTROS PCT: 50 %
Neutro Abs: 3.8 10*3/uL (ref 1.7–7.7)

## 2018-10-04 LAB — TROPONIN I: Troponin I: <0.03 ng/mL (ref <0.03)

## 2018-10-04 LAB — HEMOGLOBIN A1C
Hgb A1c MFr Bld: 5.1 % (ref 4.8–5.6)
MEAN PLASMA GLUCOSE: 99.67 mg/dL

## 2018-10-04 LAB — APTT: aPTT: 30 seconds (ref 24–36)

## 2018-10-04 MED ORDER — GABAPENTIN 600 MG PO TABS
600.0000 mg | ORAL_TABLET | Freq: Every day | ORAL | Status: DC
  Administered 2018-10-04: 22:00:00 600 mg via ORAL
  Filled 2018-10-04: qty 1

## 2018-10-04 MED ORDER — VITAMIN B-6 50 MG PO TABS
500.0000 mg | ORAL_TABLET | Freq: Every day | ORAL | Status: DC
  Filled 2018-10-04: qty 10

## 2018-10-04 MED ORDER — CYSTADANE PO POWD
8.0000 | Freq: Three times a day (TID) | ORAL | Status: DC
  Administered 2018-10-06 – 2018-10-07 (×4): 8 via ORAL
  Filled 2018-10-04: qty 180

## 2018-10-04 MED ORDER — FOLIC ACID 1 MG PO TABS
1000.0000 ug | ORAL_TABLET | Freq: Every day | ORAL | Status: DC
  Administered 2018-10-04 – 2018-10-06 (×3): 1 mg via ORAL
  Filled 2018-10-04 (×3): qty 1

## 2018-10-04 MED ORDER — GABAPENTIN 600 MG PO TABS
1200.0000 mg | ORAL_TABLET | Freq: Two times a day (BID) | ORAL | Status: DC
  Administered 2018-10-05 – 2018-10-07 (×5): 1200 mg via ORAL
  Filled 2018-10-04 (×6): qty 2

## 2018-10-04 MED ORDER — ATORVASTATIN CALCIUM 20 MG PO TABS
40.0000 mg | ORAL_TABLET | Freq: Every day | ORAL | Status: DC
  Administered 2018-10-04 – 2018-10-06 (×3): 40 mg via ORAL
  Filled 2018-10-04 (×3): qty 2

## 2018-10-04 MED ORDER — SODIUM CHLORIDE 0.9 % IV SOLN
INTRAVENOUS | Status: DC
  Administered 2018-10-04 – 2018-10-05 (×2): via INTRAVENOUS

## 2018-10-04 MED ORDER — ONDANSETRON HCL 4 MG/2ML IJ SOLN: 4.0000 mg | Freq: Four times a day (QID) | INTRAMUSCULAR | Status: DC | PRN

## 2018-10-04 MED ORDER — VITAMIN B-12 1000 MCG PO TABS
1000.0000 ug | ORAL_TABLET | Freq: Every day | ORAL | Status: DC
  Administered 2018-10-04 – 2018-10-06 (×3): 1000 ug via ORAL
  Filled 2018-10-04 (×3): qty 1

## 2018-10-04 MED ORDER — OLANZAPINE 10 MG PO TABS
10.0000 mg | ORAL_TABLET | Freq: Every day | ORAL | Status: DC
  Filled 2018-10-04 (×2): qty 1

## 2018-10-04 MED ORDER — ASPIRIN EC 325 MG PO TBEC
325.0000 mg | DELAYED_RELEASE_TABLET | Freq: Every day | ORAL | Status: DC
  Administered 2018-10-05 – 2018-10-07 (×3): 325 mg via ORAL
  Filled 2018-10-04 (×4): qty 1

## 2018-10-04 MED ORDER — STROKE: EARLY STAGES OF RECOVERY BOOK
Freq: Once | Status: AC
  Administered 2018-10-04: 23:00:00

## 2018-10-04 MED ORDER — DICYCLOMINE HCL 10 MG PO CAPS
10.0000 mg | ORAL_CAPSULE | Freq: Four times a day (QID) | ORAL | Status: DC | PRN
  Administered 2018-10-04: 20:00:00 10 mg via ORAL
  Filled 2018-10-04 (×5): qty 1

## 2018-10-04 MED ORDER — ASPIRIN EC 325 MG PO TBEC
DELAYED_RELEASE_TABLET | ORAL | Status: AC
  Administered 2018-10-04: 22:00:00
  Filled 2018-10-04: qty 1

## 2018-10-04 MED ORDER — ASPIRIN 325 MG PO TABS
325.0000 mg | ORAL_TABLET | Freq: Every day | ORAL | Status: DC
  Filled 2018-10-04: qty 1

## 2018-10-04 MED ORDER — GABAPENTIN 600 MG PO TABS
600.0000 mg | ORAL_TABLET | Freq: Once | ORAL | Status: AC
  Administered 2018-10-04: 23:00:00 600 mg via ORAL
  Filled 2018-10-04: qty 1

## 2018-10-04 MED ORDER — ENOXAPARIN SODIUM 40 MG/0.4ML ~~LOC~~ SOLN
40.0000 mg | SUBCUTANEOUS | Status: DC
  Administered 2018-10-04 – 2018-10-06 (×3): 40 mg via SUBCUTANEOUS
  Filled 2018-10-04 (×3): qty 0.4

## 2018-10-04 MED ORDER — TRAZODONE HCL 50 MG PO TABS
200.0000 mg | ORAL_TABLET | Freq: Every day | ORAL | Status: DC
  Administered 2018-10-04 – 2018-10-05 (×2): 200 mg via ORAL
  Filled 2018-10-04 (×2): qty 4

## 2018-10-04 MED ORDER — ACETYLCYSTEINE 600 MG PO CAPS: 1800.0000 mg | ORAL_CAPSULE | Freq: Every day | ORAL | Status: DC

## 2018-10-04 MED ORDER — PREGABALIN 75 MG PO CAPS
150.0000 mg | ORAL_CAPSULE | Freq: Two times a day (BID) | ORAL | Status: DC
  Filled 2018-10-04 (×2): qty 2

## 2018-10-04 MED ORDER — ACETAMINOPHEN 650 MG RE SUPP: 650.0000 mg | Freq: Four times a day (QID) | RECTAL | Status: DC | PRN

## 2018-10-04 MED ORDER — ONDANSETRON HCL 4 MG PO TABS
4.0000 mg | ORAL_TABLET | Freq: Four times a day (QID) | ORAL | Status: DC | PRN
  Administered 2018-10-07 (×2): 4 mg via ORAL
  Filled 2018-10-04 (×2): qty 1

## 2018-10-04 MED ORDER — SERTRALINE HCL 50 MG PO TABS
150.0000 mg | ORAL_TABLET | Freq: Every day | ORAL | Status: DC
  Administered 2018-10-05: 150 mg via ORAL
  Filled 2018-10-04: qty 3

## 2018-10-04 MED ORDER — ALPRAZOLAM 0.5 MG PO TABS
0.5000 mg | ORAL_TABLET | Freq: Three times a day (TID) | ORAL | Status: DC
  Administered 2018-10-04 – 2018-10-07 (×8): 0.5 mg via ORAL
  Filled 2018-10-04 (×9): qty 1

## 2018-10-04 MED ORDER — ACETAMINOPHEN 325 MG PO TABS: 650.0000 mg | ORAL_TABLET | Freq: Four times a day (QID) | ORAL | Status: DC | PRN

## 2018-10-04 MED ORDER — VITAMIN B-6 50 MG PO TABS
500.0000 mg | ORAL_TABLET | Freq: Every day | ORAL | Status: DC
  Filled 2018-10-04: qty 10
  Filled 2018-10-04 (×2): qty 5
  Filled 2018-10-04 (×3): qty 10

## 2018-10-04 MED ORDER — ASPIRIN 300 MG RE SUPP: 300.0000 mg | Freq: Every day | RECTAL | Status: DC

## 2018-10-04 NOTE — ED Provider Notes (Signed)
[facility] Emergency Department Provider Note ____________________________________________   First MD Initiated Contact with Patient 10/04/18 1823     (approximate)  I have reviewed the triage vital signs and the nursing notes.   HISTORY  Chief Complaint No chief complaint on file.    HPI Jesus Crosby. is a 52 y.o. male with PMH as noted below including homocystinuria on chronic B6 treatment (with resulting neuropathy) as well as stroke at a young age who presents with slurred speech, unclear exact onset but occurring over the last 2 days, as well as some blurred vision and headaches over the last week.  The patient states that the symptoms are identical to when he had the prior stroke.  He denies any new focal numbness or weakness that are changed from his baseline neuropathy.  He states he spoke to his doctor who instructed him to come to the hospital.   Past Medical History:  Diagnosis Date  . Anxiety   . Arthritis    knees,Right wrist  . Avascular necrosis ([place])   . Brain bleed ([place])   . Cataract    bilateral repair with lens implants  . Depression   . Family history of adverse reaction to anesthesia    n/v-mom  . GERD (gastroesophageal reflux disease)   . Homocystinuria ([place])   . Hypertension   . Idiopathic progressive polyneuropathy   . Lens disease   . Neuromuscular disorder ([place])   . Obesity, Class II, BMI 35-39.9   . OCD (obsessive compulsive disorder)   . Paresthesia 06/10/2015  . Sleep apnea   . Stroke [facility])    with brain bleed at age 72-no deficits  . Umbilical hernia     Patient Active Problem List   Diagnosis Date Noted  . Aphasia 10/04/2018  . Arthritis 04/26/2018  . Arthritis of knee 04/26/2018  . Bilateral cataracts 04/26/2018  . Class 1 obesity 04/26/2018  . Disorder of lens 04/26/2018  . Exomphalos 04/26/2018  . OSA on CPAP 04/26/2018  . Prediabetes 04/26/2018  . History of total hip arthroplasty 07/05/2017  .  Spinal stenosis of lumbar region 04/25/2017  . Trochanteric bursitis 02/27/2017  . S/P laparoscopic sleeve gastrectomy 08/17/2016  . Alcohol-induced mood disorder ([place])   . OCD (obsessive compulsive disorder)   . Paresthesia 06/10/2015  . Diplopia 08/15/2014  . Sinus tachycardia 08/15/2014  . CVA (cerebral infarction) 08/14/2014  . Neuropathy 04/04/2014  . Intraocular lens dislocation 09/12/2013  . DDD (degenerative disc disease), lumbosacral 07/04/2013  . HTN (hypertension) 07/04/2013  . Major depressive disorder, single episode 07/03/2009  . 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 HYPERLIPIDEMIA 05/28/2008  . ANXIETY 05/28/2008  . OBSESSIVE-COMPULSIVE DISORDER 05/28/2008    Past Surgical History:  Procedure Laterality Date  . COLONOSCOPY WITH PROPOFOL N/A 07/30/2015   Procedure: COLONOSCOPY WITH PROPOFOL;  Surgeon: [name], MD;  Location: [facility];  Service: Endoscopy;  Laterality: N/A;  . DERMABRASION OF FACE     due to acne scars  . distal radius fracture of right hand (plates, screws, and pins)  2011  . ESOPHAGOGASTRODUODENOSCOPY (EGD) WITH PROPOFOL N/A 07/30/2015   Procedure: ESOPHAGOGASTRODUODENOSCOPY (EGD) WITH PROPOFOL;  Surgeon: [name], MD;  Location: [facility];  Service: Endoscopy;  Laterality: N/A;  . EYE SURGERY     LASER + SURG BIL   . GAS/FLUID EXCHANGE Right 09/17/2013   Procedure: GAS/FLUID EXCHANGE;  Surgeon: Hayden Pedro, MD;  Location: [place];  Service:  Ophthalmology;  Laterality: Right;  . gastric sleeve surgery  08/17/2016  . HIP ARTHROPLASTY Left   . INTRAOCULAR LENS INSERTION Bilateral    lens disease due to homocysteinuria  . NASAL SEPTUM SURGERY  march 2016   @ [facility]  . PARS PLANA VITRECTOMY Right 09/17/2013   Procedure: PARS PLANA VITRECTOMY WITH 25G REMOVAL/SUTURE SECONDARY INTRAOCULAR LENS;  Surgeon: Hayden Pedro, MD;  Location: [place];  Service: Ophthalmology;  Laterality: Right;  .  PHOTOCOAGULATION Right 09/17/2013   Procedure: PHOTOCOAGULATION;  Surgeon: Hayden Pedro, MD;  Location: [place];  Service: Ophthalmology;  Laterality: Right;  HEADSCOPE LASER  . scapholunate ligament reconstruction of right hand  2011  . UMBILICAL HERNIA REPAIR N/A 05/07/2015   Procedure: HERNIA REPAIR UMBILICAL ADULT;  Surgeon: Florene Glen, MD;  Location: [facility];  Service: General;  Laterality: N/A;  . WRIST SURGERY Right     Prior to Admission medications   Medication Sig Start Date End Date Taking? Authorizing Provider  Acetylcysteine (NAC) 600 MG CAPS Take 600 mg by mouth. 3 at bedtime    [provider]  ALPRAZolam Duanne Moron) 0.5 MG tablet Take 0.5 mg by mouth 3 (three) times daily.    [provider]  amLODipine (NORVASC) 5 MG tablet TAKE 1 TABLET(5 MG) BY MOUTH TWICE DAILY 08/30/18   Ronnell Freshwater, NP  aspirin 81 MG chewable tablet Chew by mouth daily.    [provider]  atorvastatin (LIPITOR) 10 MG tablet Take 1 tablet (10 mg total) by mouth daily. 01/24/18   [name], MD  [facility]) POWD Take 8 Scoops by mouth 3 (three) times daily.    [provider]  Cyanocobalamin (VITAMIN B 12 PO) Take 1,000 mcg by mouth.    [provider]  dicyclomine (BENTYL) 10 MG capsule Take 1 capsule (10 mg total) by mouth every 6 (six) hours as needed (abdominal pain, cramping or diarrhea). Patient not taking: Reported on 10/04/2018 09/21/18   Esterwood, Amy S, PA-C  folic acid (FOLVITE) 166 MCG tablet Take 400 mcg by mouth at bedtime. 3 tablets at bedtime.    [provider]  furosemide (LASIX) 20 MG tablet Take 1 tablet (20 mg total) by mouth every morning. 09/18/18   Ronnell Freshwater, NP  gabapentin (NEURONTIN) 600 MG tablet Take 600 mg by mouth at bedtime. 2 in a.m. ,2 at bedtime    [provider]  losartan (COZAAR) 50 MG tablet Take half tab po qd 01/24/18   [name], MD  metoprolol tartrate (LOPRESSOR) 50 MG  tablet Take 1.5 tablets (75 mg total) by mouth 2 (two) times daily. 08/27/18   Ronnell Freshwater, NP  NONFORMULARY OR COMPOUNDED ITEM See pharmacy note Patient not taking: Reported on 10/04/2018 04/27/18   Edrick Kins, DPM  OLANZapine (ZYPREXA) 10 MG tablet Take 10 mg by mouth at bedtime.    [provider]  ondansetron (ZOFRAN) 4 MG tablet TK 1 T PO Q 6 TO 8 H PRN N 04/10/18   [provider]  Peppermint Oil (IBGARD PO) Take by mouth every 4 (four) hours as needed.    [provider]  pregabalin (LYRICA) 150 MG capsule Take 150 mg by mouth 2 (two) times daily.    [provider]  pyridOXINE (VITAMIN B-6) 100 MG tablet Take 500 mg by mouth at bedtime.     [provider]  sertraline (ZOLOFT) 100 MG tablet Take 150 mg by mouth daily.  [provider]  traZODone (DESYREL) 100 MG tablet Take 200 mg by mouth at bedtime.    [provider]    Allergies Pineapple flavor; Pineapple; Amoxicillin; and Penicillins  Family History  Problem Relation Age of Onset  . Breast cancer Mother   . Hypertension Father   . Stroke Maternal Grandmother   . Stroke Maternal Grandfather   . Stroke Paternal Grandmother   . Stroke Paternal Grandfather   . Stroke Other     Social History Social History   Tobacco Use  . Smoking status: Never Smoker  . Smokeless tobacco: Never Used  Substance Use Topics  . Alcohol use: No    Alcohol/week: 0.0 standard drinks  . Drug use: No    Review of Systems  Constitutional: No fever. Eyes: No visual changes. ENT: No neck pain. Cardiovascular: Denies chest pain. Respiratory: Denies shortness of breath. Gastrointestinal: No vomiting.  Genitourinary: Negative for flank pain.  Musculoskeletal: Positive for for back pain. Skin: Negative for rash. Neurological: Positive for headaches.  Positive for speech disturbance.  Negative for new focal weakness or  numbness.   ____________________________________________   PHYSICAL EXAM:  VITAL SIGNS: ED Triage Vitals  Enc Vitals Group     BP 10/04/18 1825 100/74     Pulse Rate 10/04/18 1611 91     Resp 10/04/18 1611 16     Temp 10/04/18 1611 98.3 F (36.8 C)     Temp Source 10/04/18 1611 Oral     SpO2 10/04/18 1611 98 %     Weight 10/04/18 1612 282 lb (127.9 kg)     Height 10/04/18 1612 6\' 3"  (1.905 m)     Head Circumference --      Peak Flow --      Pain Score 10/04/18 1612 9     Pain Loc --      Pain Edu? --      Excl. in [place]? --     Constitutional: Alert and oriented.  Relatively comfortable appearing.   Eyes: Conjunctivae are normal.  EOMI.  PERRLA. Head: Atraumatic. Nose: No congestion/rhinnorhea. Mouth/Throat: Mucous membranes are moist.   Neck: Normal range of motion.  Cardiovascular: Normal rate, regular rhythm. Grossly normal heart sounds.  Good peripheral circulation. Respiratory: Normal respiratory effort.  No retractions. Lungs CTAB. Gastrointestinal:  No distention.  Musculoskeletal: Extremities warm and well perfused.  Neurologic: Somewhat dysarthric speech.  No aphasia.  5/5 motor strength and intact sensation to all extremities.  Normal coordination on finger-to-nose. Skin:  Skin is warm and dry. No rash noted. Psychiatric: Mood and affect are normal. Speech and behavior are normal.  ____________________________________________   LABS (all labs ordered are listed, but only abnormal results are displayed)  Labs Reviewed  COMPREHENSIVE METABOLIC PANEL - Abnormal; Notable for the following components:      Result Value   Sodium 133 (*)    Chloride 92 (*)    Total Protein 9.0 (*)    AST 57 (*)    ALT 56 (*)    All other components within normal limits  GLUCOSE, CAPILLARY  CBC  DIFFERENTIAL  TROPONIN I  PROTIME-INR  APTT  CBG MONITORING, ED   ____________________________________________  EKG  ED ECG REPORT I, Arta Silence, the attending  physician, personally viewed and interpreted this ECG.  Date: 10/04/2018 EKG Time: 1608 Rate: 80 Rhythm: normal sinus rhythm QRS Axis: normal Intervals: normal ST/T Wave abnormalities: normal Narrative Interpretation: no evidence of acute ischemia  ____________________________________________  RADIOLOGY  CT head:  No ICH or other acute abnormality  ____________________________________________   PROCEDURES  Procedure(s) performed: No  Procedures  Critical Care performed: No ____________________________________________   INITIAL IMPRESSION / ASSESSMENT AND PLAN / ED COURSE  Pertinent labs & imaging results that were available during my care of the patient were reviewed by me and considered in my medical decision making (see chart for details).  52 year old male with PMH as noted above including remote prior stroke history presents with a primary symptom of dysarthria occurring over the last 1 to 2 days, as well as some headaches and blurred vision over approximately the last week.  The patient has chronic neuropathy thought to be related to B6 treatments for his homocystinuria.  On exam, the patient is relatively comfortable appearing and his vital signs are normal.  He has some dysarthria and his friend who is here with him confirms that his voice does not sound normal.  The remainder of the neuro exam is nonfocal.  CT shows no acute bleed or stroke.  Although the patient's symptom is fairly isolated given his prior stroke history and increased risk as well as the fact that he continues to be symptomatic, he will require admission for MRI and further stroke work-up.  Patient agrees with this plan.  I signed the patient out to the hospitalist Dr. Anselm Jungling.  ____________________________________________   FINAL CLINICAL IMPRESSION(S) / ED DIAGNOSES  Final diagnoses:  Speech disturbance, unspecified type      NEW MEDICATIONS STARTED DURING THIS VISIT:  New  Prescriptions   No medications on file     Note:  This document was prepared using Dragon voice recognition software and may include unintentional dictation errors.    Arta Silence, MD 10/04/18 [phone]

## 2018-10-04 NOTE — ED Notes (Signed)
Unable to give report, protected time

## 2018-10-04 NOTE — ED Triage Notes (Signed)
Pt to ED reporting his PCP sent him to ED after pt verbalized slurred speech, unsteady gait, headaches and blurred vision x 1 week. PT is having trouble getting his thoughts out in triage but is alert and oriented x 4.   Hx of stroke 22 years ago with no deficits.

## 2018-10-04 NOTE — Patient Instructions (Addendum)
It was nice meeting you today.  As we discussed, since you have tried so many analgesics, opioid therapy may be a consideration for your chronic refractory neuropathic pain.  Free to be considered for chronic opioid therapy, he must discontinue and refrain from chronic benzodiazepines especially in your case with obstructive sleep apnea being on CPAP.  We discussed alpha lipoic acid that you can take 600 mg daily for your neuropathic pain (OVER THE COUNTER).  Please give this medication 3 to 4 weeks.  We also discussed a lidocaine infusion.  If we were to proceed with a lidocaine infusion, you will need an EKG prior.

## 2018-10-04 NOTE — Progress Notes (Signed)
Patient states his home regime is 2L O2 bleen into CPAP, set on 12cm Pressure. CPAP is set on Auto Titrate with minimum pressure set on 12, maximum pressure set on 20, 2L 02.

## 2018-10-04 NOTE — Progress Notes (Signed)
Patient's Name: Jesus Crosby.  MRN: [id number]  Referring Provider: Ricard Dillon,*  DOB: 1966-01-10  PCP: Ronnell Freshwater, NP  DOS: 10/04/2018  Note by: Gillis Santa, MD  Service setting: Ambulatory outpatient  Specialty: Interventional Pain Management  Location: [facility] (AMB) Pain Management Facility  Visit type: Initial Patient Evaluation  Patient type: New Patient   Primary Reason(s) for Visit: Encounter for initial evaluation of one or more chronic problems (new to examiner) potentially causing chronic pain, and posing a threat to normal musculoskeletal function. (Level of risk: High) CC: Leg Pain (lower) and Foot Pain (lower)  HPI  Jesus Crosby is a 52 y.o. year old, male patient, who comes today to see [place] for the first time for an initial evaluation of his chronic pain. 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[place]); HYPERLIPIDEMIA; ANXIETY; OBSESSIVE-COMPULSIVE DISORDER; DDD (degenerative disc disease), lumbosacral; HTN (hypertension); Intraocular lens dislocation; CVA (cerebral infarction); Diplopia; Sinus tachycardia; Paresthesia; Alcohol-induced mood disorder ([place]); OCD (obsessive compulsive disorder); Arthritis; Arthritis of knee; Bilateral cataracts; Class 1 obesity; Disorder of lens; Exomphalos; History of total hip arthroplasty; Major depressive disorder, single episode; Neuropathy; OSA on CPAP; Prediabetes; S/P laparoscopic sleeve gastrectomy; Spinal stenosis of lumbar region; and Trochanteric bursitis on their problem list. Today he comes in for evaluation of his Leg Pain (lower) and Foot Pain (lower)  Pain Assessment: Location: Upper Leg Radiating:   Onset: More than a month ago Duration: Chronic pain, Neuropathic pain Quality: Numbness, Burning, Tingling(stinging) Severity: 9 /10 (subjective, self-reported pain score)  Note: Reported level is inconsistent with clinical observations. Clinically the patient looks like a 3/10 A 3/10 is viewed as "Moderate" and  described as significantly interfering with activities of daily living (ADL). It becomes difficult to feed, bathe, get dressed, get on and off the toilet or to perform personal hygiene functions. Difficult to get in and out of bed or a chair without assistance. Very distracting. With effort, it can be ignored when deeply involved in activities.       When using our objective Pain Scale, levels between 6 and 10/10 are said to belong in an emergency room, as it progressively worsens from a 6/10, described as severely limiting, requiring emergency care not usually available at an outpatient pain management facility. At a 6/10 level, communication becomes difficult and requires great effort. Assistance to reach the emergency department may be required. Facial flushing and profuse sweating along with potentially dangerous increases in heart rate and blood pressure will be evident. Effect on ADL:   Timing: Constant Modifying factors: Gabapentin, Lyrica, Cymbalta, elevation of feet BP: 114/83  HR: 80  Onset and Duration: Gradual Cause of pain: high levels of Vitamin B6 Severity: Getting worse, NAS-11 at its worse: 10/10, NAS-11 at its best: 7/10, NAS-11 now: 9/10 and NAS-11 on the average: 9/10 Timing: Not influenced by the time of the day Aggravating Factors: Bending, Lifiting, Motion, Prolonged standing, Squatting, Walking, Walking uphill and Walking downhill Alleviating Factors: Lying down, Resting and Relaxation therapy Associated Problems: Day-time cramps, Night-time cramps, Depression, Fatigue, Nausea, Numbness, Spasms, Sweating, Tingling, Weakness, Pain that wakes patient up and Pain that does not allow patient to sleep Quality of Pain: Aching, Agonizing, Annoying, Burning, Constant, Cramping, Disabling, Distressing, Dreadful, Exhausting, Feeling of weight, Getting longer, Heavy, Horrible, Pressure-like, Pulsating, Sharp, Shooting, Stabbing, Throbbing, Tingling, Tiring and Uncomfortable Previous  Examinations or Tests: CT scan, Endoscopy, X-rays, Nerve conduction test, Neurological evaluation, Orthopedic evaluation and Psychiatric evaluation Previous Treatments: Relaxation therapy  The patient  comes into the clinics today for the first time for a chronic pain management evaluation.   Patient is a 52 year old male with a history of obesity, headaches, OCD, depression, obstructive sleep apnea on CPAP, peripheral neuropathy.  Patient has chronic neuropathic pain in his bilateral feet, right greater than left.  It is unclear why he has such profound neuropathy but could be secondary to taking vitamin B6 for his homocystinuria which the patient states.  Patient was seen previously at the [facility] pain clinic.  He has tried various neuropathic medications including gabapentin, Lyrica 150 mg twice daily.  He is also tried Cymbalta which resulted in nausea and discomfort.  This was likely related to serotonin syndrome.  Patient did have evaluation for spinal cord stimulation but was not deemed a suitable candidate given his depression and OCD.  Patient also states that he has neuropathic pain in his upper extremities which could be secondary to toxic neuropathy and has had EMG studies that show right medial and ulnar neuropathy.   Historic Controlled Substance Pharmacotherapy Review   Patient is on Xanax 0.5 mg 3 times daily as needed, last fill 09/17/2018.  Patient's previous opioid fill was in August 2018 hydrocodone 5 mill grams, quantity 20  Historical Monitoring: The patient  reports that he does not use drugs. List of all UDS Test(s): Lab Results  Component Value Date   MDMA NONE DETECTED 04/30/2016   COCAINSCRNUR NONE DETECTED 04/30/2016   COCAINSCRNUR NONE DETECTED 08/15/2014   PCPSCRNUR NONE DETECTED 04/30/2016   THCU NONE DETECTED 04/30/2016   THCU NONE DETECTED 08/15/2014   ETH 177 (H) 04/29/2016   ETH <11 08/14/2014   List of other Serum/Urine Drug Screening Test(s):  Lab Results   Component Value Date   COCAINSCRNUR NONE DETECTED 04/30/2016   COCAINSCRNUR NONE DETECTED 08/15/2014   THCU NONE DETECTED 04/30/2016   THCU NONE DETECTED 08/15/2014   ETH 177 (H) 04/29/2016   ETH <11 08/14/2014   Historical Background Evaluation: [place] PMP: Six (6) year initial data search conducted.             [place] Department of public safety, offender search: Editor, commissioning Information) Non-contributory Risk Assessment Profile: Aberrant behavior: None observed or detected today Risk factors for fatal opioid overdose: age 87-11 years old, Benzodiazepine use, concomitant use of Benzodiazepines, COPD or asthma, Psychiatric illness including depression and OCD and None identified today Fatal overdose hazard ratio (HR): Calculation deferred Non-fatal overdose hazard ratio (HR): Calculation deferred Risk of opioid abuse or dependence: 0.7-3.0% with doses ? 36 MME/day and 6.1-26% with doses ? 120 MME/day. Substance use disorder (SUD) risk level: High Personal History of Substance Abuse (SUD-Substance use disorder):  Alcohol: Negative  Illegal Drugs: Negative  Rx Drugs: Negative  ORT Risk Level calculation: High Risk Opioid Risk Tool - 10/04/18 0830      Family History of Substance Abuse   Alcohol  Negative    Illegal Drugs  Positive Male    Rx Drugs  Positive Male or Male      Personal History of Substance Abuse   Alcohol  Negative    Illegal Drugs  Negative    Rx Drugs  Negative      Age   Age between 72-45 years   No      History of Preadolescent Sexual Abuse   History of Preadolescent Sexual Abuse  Negative or Male      Psychological Disease   Psychological Disease  Positive    ADD  Positive  OCD  Positive    Bipolar  Negative    Schizophrenia  Negative    Depression  Negative      Total Score   Opioid Risk Tool Scoring  9    Opioid Risk Interpretation  High Risk      ORT Scoring interpretation table:  Score <3 = Low Risk for SUD  Score between 4-7 = Moderate Risk for  SUD  Score >8 = High Risk for Opioid Abuse   PHQ-2 Depression Scale:  Total score: 0  PHQ-2 Scoring interpretation table: (Score and probability of major depressive disorder)  Score 0 = No depression  Score 1 = 15.4% Probability  Score 2 = 21.1% Probability  Score 3 = 38.4% Probability  Score 4 = 45.5% Probability  Score 5 = 56.4% Probability  Score 6 = 78.6% Probability   PHQ-9 Depression Scale:  Total score: 0  PHQ-9 Scoring interpretation table:  Score 0-4 = No depression  Score 5-9 = Mild depression  Score 10-14 = Moderate depression  Score 15-19 = Moderately severe depression  Score 20-27 = Severe depression (2.4 times higher risk of SUD and 2.89 times higher risk of overuse)   Pharmacologic Plan: As per protocol, I have not taken over any controlled substance management, pending the results of ordered tests and/or consults.            Initial impression: Pending review of available data and ordered tests.  Meds   Current Outpatient Medications:  .  Acetylcysteine (NAC) 600 MG CAPS, Take 600 mg by mouth. 3 at bedtime, Disp: , Rfl:  .  ALPRAZolam (XANAX) 0.5 MG tablet, Take 0.5 mg by mouth 3 (three) times daily., Disp: , Rfl:  .  amLODipine (NORVASC) 5 MG tablet, TAKE 1 TABLET(5 MG) BY MOUTH TWICE DAILY, Disp: 180 tablet, Rfl: 0 .  aspirin 81 MG chewable tablet, Chew by mouth daily., Disp: , Rfl:  .  atorvastatin (LIPITOR) 10 MG tablet, Take 1 tablet (10 mg total) by mouth daily., Disp: 90 tablet, Rfl: 3 .  Betaine (CYSTADANE) POWD, Take 8 Scoops by mouth 3 (three) times daily., Disp: , Rfl:  .  Cyanocobalamin (VITAMIN B 12 PO), Take 1,000 mcg by mouth., Disp: , Rfl:  .  folic acid (FOLVITE) 094 MCG tablet, Take 400 mcg by mouth at bedtime. 3 tablets at bedtime., Disp: , Rfl:  .  furosemide (LASIX) 20 MG tablet, Take 1 tablet (20 mg total) by mouth every morning., Disp: 30 tablet, Rfl: 3 .  gabapentin (NEURONTIN) 600 MG tablet, Take 600 mg by mouth at bedtime. 2 in a.m. ,2  at bedtime, Disp: , Rfl:  .  losartan (COZAAR) 50 MG tablet, Take half tab po qd, Disp: 45 tablet, Rfl: 3 .  metoprolol tartrate (LOPRESSOR) 50 MG tablet, Take 1.5 tablets (75 mg total) by mouth 2 (two) times daily., Disp: 270 tablet, Rfl: 0 .  OLANZapine (ZYPREXA) 10 MG tablet, Take 10 mg by mouth at bedtime., Disp: , Rfl:  .  Peppermint Oil (IBGARD PO), Take by mouth every 4 (four) hours as needed., Disp: , Rfl:  .  pyridOXINE (VITAMIN B-6) 100 MG tablet, Take 500 mg by mouth at bedtime. , Disp: , Rfl:  .  sertraline (ZOLOFT) 100 MG tablet, Take 150 mg by mouth daily., Disp: , Rfl:  .  traZODone (DESYREL) 100 MG tablet, Take 200 mg by mouth at bedtime., Disp: , Rfl:  .  dicyclomine (BENTYL) 10 MG capsule, Take 1 capsule (10 mg  total) by mouth every 6 (six) hours as needed (abdominal pain, cramping or diarrhea). (Patient not taking: Reported on 10/04/2018), Disp: 90 capsule, Rfl: 0 .  NONFORMULARY OR COMPOUNDED ITEM, See pharmacy note (Patient not taking: Reported on 10/04/2018), Disp: 120 each, Rfl: 2 .  ondansetron (ZOFRAN) 4 MG tablet, TK 1 T PO Q 6 TO 8 H PRN N, Disp: , Rfl: 0 .  pregabalin (LYRICA) 150 MG capsule, Take 150 mg by mouth 2 (two) times daily., Disp: , Rfl:   Imaging Review  Lumbosacral Imaging: Lumbar MR wo contrast:  Results for orders placed during the hospital encounter of 01/12/05  MR Lumbar Spine Wo Contrast   Narrative Clinical Data: Low back pain radiating down the left leg.   MRI OF THE LUMBAR SPINE WITHOUT CONTRAST - 01/12/05:  No prior studies.   Technique: Multiplanar and multisequence images were obtained through the lumbar spine. For purposes of this dictation, I assume that L5 is the lowest segmental lumbar type non-rib bearing vertebra.   Findings: There is intervertebral disc desiccation at L2-3, L4-5 and at L5-S1. There is loss of intervertebral disc height at L5-S1 with Modic type II degenerative end plate changes. The conus medullaris is at the L1-2 level and  appears unremarkable.   Additional findings at individual levels are as follows:  L2-3: Broad-based disc bulge is present at this level without significant central or foraminal stenosis.   L3-4: Minimal disc bulge L4-5. Broad-based disc bulge is present along with facet overgrowth and ligamentum flavum redundancy. There is mild left subarticular lateral recess stenosis.   L5-S1: Broad-based disc bulge is present causing mild right and moderate left foraminal stenosis and also slightly posteriorly displacing the left S1 nerve root in the lateral recess.   IMPRESSION:   Bilateral foraminal stenosis at L5-S1, left greater than right, due to foraminal component of broad-based disc bulge and facet overgrowth.   Mild subarticular lateral recess stenosis on the left at L4-5 and borderline left subarticular lateral recess stenosis at L5-S1.  Provider: Loni Beckwith    Lumbar MR w/wo contrast:  Results for orders placed during the hospital encounter of 05/23/13  MR Lumbar Spine W Wo Contrast   Narrative *RADIOLOGY REPORT*  Clinical Data: Low back and right leg pain with bladder symptoms.  MRI LUMBAR SPINE WITHOUT AND WITH CONTRAST  Technique:  Multiplanar and multiecho pulse sequences of the lumbar spine were obtained without and with intravenous contrast.  Contrast: 68m MULTIHANCE GADOBENATE DIMEGLUMINE 529 MG/ML IV SOLN  Comparison: Lumbar MRI January 12, 2005.  Lumbar spine radiographs 05/23/2013.  Findings: There is chronic disc and endplate degeneration at L5-S1 which appears mildly progressive.  There is no evidence of acute fracture or pars defect.  The alignment is anatomic.  The conus medullaris extends to the L2 level and appears normal. There is no abnormal intradural enhancement.  There are no paraspinal abnormalities.  There is minimal disc desiccation at the L1-L2 and L2-L3 levels. However, from T12-T1 through L3-L4, there is no disc herniation, spinal stenosis or nerve  root encroachment.  L4-L5:  Mild annular disc bulging is stable.  There is mildly progressive facet and ligamentous hypertrophy with small bilateral facet joint effusions.  Minimal narrowing of the lateral recesses is stable.  There is no foraminal compromise or exiting nerve root encroachment.  L5-S1: There is chronic degenerative disc disease with progressive loss of disc height, annular bulging and paraspinal osteophytes. Mild facet and ligamentous hypertrophy bilaterally is unchanged. Likewise, the mild right  and moderate left foraminal stenosis is similar to the prior examination.  There is possible encroachment on either exiting L5 nerve root.  No S1 nerve root displacement is identified.  IMPRESSION:  1.  Mildly progressive chronic degenerative disc disease at L5-S1 with associated chronic left greater than right foraminal stenosis. There is possible chronic L5 nerve root encroachment. 2.  No other significant spinal stenosis or nerve root encroachment. 3.  No acute findings are identified to explain the patient's radicular symptoms. 4.  Mildly progressive facet joint effusions bilaterally at L4-L5.   Original Report Authenticated By: Richardean Sale, M.D.     Lumbar DG 2-3 views:  Results for orders placed during the hospital encounter of 05/23/13  DG Lumbar Spine 2-3 Views   Narrative *RADIOLOGY REPORT*  Clinical Data: Low back pain.  LUMBAR SPINE - 2-3 VIEW  Comparison: Lumbar spine MRI 01/12/2005.  Findings: Three views of the lumbar spine demonstrate no definite acute displaced fracture or compression type fracture.  Alignment is anatomic.  Multilevel degenerative disc disease and facet arthropathy, most severe at L5-S1.  IMPRESSION: 1.  No acute radiographic abnormality of the lumbar spine. 2.  Multilevel degenerative disc disease and lumbar spondylosis, most severe at L5-S1.   Original Report Authenticated By: Vinnie Langton, M.D.    Results for  orders placed during the hospital encounter of 07/22/15  DG Knee Complete 4 Views Right   Narrative CLINICAL DATA:  Bilateral knee pain.  Fall.  Initial evaluation .  EXAM: RIGHT KNEE - COMPLETE 4+ VIEW  COMPARISON:  None.  FINDINGS: Small knee joint effusion cannot be excluded. No acute bony or joint abnormality identified. No evidence of fracture or dislocation.  IMPRESSION: Small knee joint effusion cannot be excluded. No acute bony or joint abnormality identified.   Electronically Signed   By: Marcello Moores  Register   On: 07/22/2015 14:36    Knee-L DG 4 views:  Results for orders placed during the hospital encounter of 07/22/15  DG Knee Complete 4 Views Left   Narrative CLINICAL DATA:  Bilateral knee pain for 1 week after a fall 1 week ago, landing on both knees, initial encounter.  EXAM: LEFT KNEE - COMPLETE 4+ VIEW  COMPARISON:  11/02/2014.  FINDINGS: No joint effusion or fracture.  Trace patellofemoral osteophytosis.  IMPRESSION: 1. No acute findings. 2. Trace patellofemoral osteophytosis.   Electronically Signed   By: Lorin Picket M.D.   On: 07/22/2015 14:35      Hand Imaging: Hand-R DG Complete:  Results for orders placed during the hospital encounter of 03/06/14  DG Hand Complete Right   Narrative CLINICAL DATA:  Status post fall on right hand; right posterior lateral hand and thumb pain.  EXAM: RIGHT HAND - COMPLETE 3+ VIEW  COMPARISON:  Right wrist radiographs performed 03/05/2014  FINDINGS: There is no evidence of fracture or dislocation. The joint spaces are preserved; the soft tissues are unremarkable in appearance.  Mild apparent widening of the scapholunate interspace is better characterized on recent wrist radiograph. A plate and screws are again noted along the distal radius. The carpal rows are otherwise grossly intact.  IMPRESSION: No evidence of fracture or dislocation.   Electronically Signed   By: Garald Balding M.D.    On: 03/06/2014 22:16     Complexity Note: Imaging results reviewed. Results shared with Jesus Crosby, using [employer].  ROS  Cardiovascular: Daily Aspirin intake, High blood pressure and Blood thinners:  Anticoagulant Pulmonary or Respiratory: Snoring  and Temporary stoppage of breathing during sleep Neurological: Stroke (Residual deficits or weakness:  ) and Abnormal skin sensations (Peripheral Neuropathy) Review of Past Neurological Studies:  Results for orders placed or performed during the hospital encounter of 04/29/16  CT Head Wo Contrast   Narrative   CLINICAL DATA:  Acute onset of altered mental status. Initial encounter.  EXAM: CT HEAD WITHOUT CONTRAST  TECHNIQUE: Contiguous axial images were obtained from the base of the skull through the vertex without intravenous contrast.  COMPARISON:  CT of the head and MRI of the brain performed 06/03/2015  FINDINGS: There is no evidence of acute infarction, mass lesion, or intra- or extra-axial hemorrhage on CT.  Prominence of the sulci suggests mild cortical volume loss.  The brainstem and fourth ventricle are within normal limits. The basal ganglia are unremarkable in appearance. The cerebral hemispheres demonstrate grossly normal gray-white differentiation. No mass effect or midline shift is seen.  There is no evidence of fracture; visualized osseous structures are unremarkable in appearance. The orbits are within normal limits. The paranasal sinuses and mastoid air cells are well-aerated. No significant soft tissue abnormalities are seen.  IMPRESSION: 1. No acute intracranial pathology seen on CT. 2. Mild cortical volume loss noted.   Electronically Signed   By: Garald Balding M.D.   On: 04/30/2016 00:13   Results for orders placed or performed during the hospital encounter of 06/03/15  MR Brain Wo Contrast   Narrative   CLINICAL DATA:  Numbness of the RIGHT face and arm, blurred  vision in the RIGHT eye. Patient also complains of slight headache.  EXAM: MRI HEAD WITHOUT CONTRAST  TECHNIQUE: Multiplanar, multiecho pulse sequences of the brain and surrounding structures were obtained without intravenous contrast.  COMPARISON:  CT head earlier today. MR head 08/15/2014. Prior MR from 2006 is correlated also.  FINDINGS: No evidence for acute infarction, hemorrhage, mass lesion, hydrocephalus, or extra-axial fluid. Slight premature for age global atrophy. No significant white matter disease.  Marked empty sella. Marked prominence of the perioptic CSF spaces. No tonsillar herniation. Flow voids are maintained.  BILATERAL ocular lens implants. No sinus disease. No significant mastoid fluid. Small Thornwaldt cyst.  IMPRESSION: Suspected idiopathic intracranial hypertension, with marked empty sella and dilated optic nerve sheaths. These changes were observed in 2015 and are stable.  No evidence for acute stroke.   Electronically Signed   By: Staci Righter M.D.   On: 06/03/2015 20:12   Results for orders placed or performed during the hospital encounter of 03/17/05  MR Brain W Wo Contrast   Narrative   Clinical Data:  Reported homocystinuria in 1996. The patient states he had brain DVT in 1996 - ?  Reported intermittent syncope.  Right-sided headache.   MRI OF THE BRAIN WITH AND WITHOUT IV CONTRAST: Standard high field strength images were generated pre and post IV infusion of 20cc of Omniscan.   Mild generalized cerebral atrophic changes for age.  Negative for acute hemorrhage or mass effect. A very subtle finding involving the gyri and sulcus left parietal region at the level of the superior aspect of the left lateral ventricle is signal alteration which I believe involves the cortex and vascular lumen.  I feel that there is abnormal contrast enhancement as well in the form of small frontal branches of the left MCA within the sulcus having an irregular  appearance and appearing  asymmetrical to the right side and to the other left side territories.  The etiology of this finding is uncertain. On the diffusion-weighted images, there is slight increased signal intensity of the cortex. The signal alteration is not near the degree of that typically seen with acute/subacute infarctions. The vascular channels of concern appear to extend from the aspect of the sulci to the pial surface of the brain and extra-axial spaces as well.  No enlarged scalp vascular structures.  No acute intracranial findings.  See comments above which are of concern for some sort of vascular malformation or left posterior frontal territory.   I would wonder if this may represent residua of previous thrombosis with formation of collaterals. 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   MR ANGIOGRAPHY OF THE BRAIN WITHOUT CONTRAST: 3D TOF images were acquired.  At the site of the questioned pathology posterior left frontal lobe on MRI, it is difficult to definitively identify abnormal vessels.  This may be a result of the MRA technique not having the degree resolution to detect abnormality of small peripheral branches.   IMPRESSION:  MRA fails to identify a distinct vascular abnormality at the site of pathology on the MRI.  See comments above.   Provider: Mary B. Edison Nasuti  MR Angiogram Head Wo Contrast   Narrative   Clinical Data:  Reported homocystinuria in 1996. The patient states he had brain DVT in 1996 - ?  Reported intermittent syncope.  Right-sided headache.   MRI OF THE BRAIN WITH AND WITHOUT IV CONTRAST: Standard high field strength images were generated pre and post IV infusion of 20cc of Omniscan.   Mild generalized cerebral atrophic changes for age.  Negative for acute hemorrhage or mass effect. A very subtle finding involving the gyri and sulcus left parietal  region at the level of the superior aspect of the left lateral ventricle is signal alteration which I believe involves the cortex and vascular lumen.  I feel that there is abnormal contrast enhancement as well in the form of small frontal branches of the left MCA within the sulcus having an irregular appearance and appearing asymmetrical to the right side and to the other left side territories.  The etiology of this finding is uncertain. On the diffusion-weighted images, there is slight increased signal intensity of the cortex. The signal alteration is not near the degree of that typically seen with acute/subacute infarctions. The vascular channels of concern appear to extend from the aspect of the sulci to the pial surface of the brain and extra-axial spaces as well.  No enlarged scalp vascular structures.  No acute intracranial findings.  See comments above which are of concern for some sort of vascular malformation or left posterior frontal territory.   I would wonder if this may represent residua of previous thrombosis with formation of collaterals. 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   MR ANGIOGRAPHY OF THE BRAIN WITHOUT CONTRAST: 3D TOF images were acquired.  At the site of the questioned pathology posterior left frontal lobe on MRI, it is difficult to definitively identify abnormal vessels.  This may be a result of the MRA technique not having the degree resolution to detect abnormality of small peripheral branches.   IMPRESSION:  MRA fails to identify a distinct vascular abnormality at the site of pathology on the MRI.  See comments above.  Provider: Mary B. Edison Nasuti   Psychological-Psychiatric: Psychiatric disorder, Anxiousness, Depressed and Difficulty sleeping and or falling asleep Gastrointestinal: Alternating episodes iof diarrhea and constipation (IBS-Irritable bowe  syndrome) Genitourinary: No reported renal or genitourinary signs or symptoms such as difficulty voiding or producing urine, peeing blood, non-functioning kidney, kidney stones, difficulty emptying the bladder, difficulty controlling the flow of urine, or chronic kidney disease Hematological: No reported hematological signs or symptoms such as prolonged bleeding, low or poor functioning platelets, bruising or bleeding easily, hereditary bleeding problems, low energy levels due to low hemoglobin or being anemic Endocrine: No reported endocrine signs or symptoms such as high or low blood sugar, rapid heart rate due to high thyroid levels, obesity or weight gain due to slow thyroid or thyroid disease Rheumatologic: Joint aches and or swelling due to excess weight (Osteoarthritis) Musculoskeletal: Negative for myasthenia gravis, muscular dystrophy, multiple sclerosis or malignant hyperthermia Work History: Disabled  Allergies  Jesus Crosby is allergic to pineapple flavor; pineapple; amoxicillin; and penicillins.  Laboratory Chemistry  Inflammation Markers (CRP: Acute Phase) (ESR: Chronic Phase) Lab Results  Component Value Date   CRP 0.2 (L) 09/21/2018   ESRSEDRATE 9 06/03/2008                         Rheumatology Markers Lab Results  Component Value Date   ANA NEG 08/03/2015                        Renal Function Markers Lab Results  Component Value Date   BUN 13 09/21/2018   CREATININE 1.11 09/21/2018   BCR 13 08/29/2018   GFRAA 118 08/29/2018   GFRNONAA 102 08/29/2018                             Hepatic Function Markers Lab Results  Component Value Date   AST 24 08/29/2018   ALT 25 08/29/2018   ALBUMIN 4.3 08/29/2018   ALKPHOS 64 08/29/2018   HCVAB NEGATIVE 08/03/2015                        Electrolytes Lab Results  Component Value Date   NA 132 (L) 09/21/2018   K 4.2 09/21/2018   CL 94 (L) 09/21/2018   CALCIUM 10.1 09/21/2018                        Neuropathy  Markers Lab Results  Component Value Date   HGBA1C 5.5 08/15/2014                        CNS Tests No results found for: COLORCSF, APPEARCSF, RBCCOUNTCSF, WBCCSF, POLYSCSF, LYMPHSCSF, EOSCSF, PROTEINCSF, GLUCCSF, JCVIRUS, CSFOLI, IGGCSF                      Bone Pathology Markers No results found for: VD25OH, [id number], [id number], [id number], 25OHVITD1, 25OHVITD2, 25OHVITD3, TESTOFREE, TESTOSTERONE                       Coagulation Parameters Lab Results  Component Value Date   INR 1.2 (H) 07/21/2015   LABPROT 12.8 07/21/2015   APTT 29 06/03/2015   PLT 223.0 09/21/2018   DDIMER 0.28 08/15/2014  Cardiovascular Markers Lab Results  Component Value Date   TROPONINI <0.03 04/29/2016   HGB 15.5 09/21/2018   HCT 45.2 09/21/2018                         CA Markers No results found for: CEA, CA125, LABCA2                      Note: Lab results reviewed.  [place]  Drug: Jesus Crosby  reports that he does not use drugs. Alcohol:  reports that he does not drink alcohol. Tobacco:  reports that he has never smoked. He has never used smokeless tobacco. 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 Family: family history includes Breast cancer in his mother; Hypertension in his father; Stroke in his maternal grandfather, maternal grandmother, other, paternal grandfather, and paternal grandmother.  Past Surgical History:  Procedure Laterality Date  . COLONOSCOPY WITH PROPOFOL N/A 07/30/2015   Procedure: COLONOSCOPY WITH PROPOFOL;  Surgeon: [name], MD;  Location: [facility];  Service: Endoscopy;  Laterality: N/A;  . DERMABRASION OF FACE     due to acne scars  . distal radius fracture of right hand (plates, screws, and pins)  2011  . ESOPHAGOGASTRODUODENOSCOPY (EGD) WITH PROPOFOL N/A 07/30/2015   Procedure: ESOPHAGOGASTRODUODENOSCOPY (EGD) WITH PROPOFOL;  Surgeon: [name], MD;  Location: [facility];  Service: Endoscopy;  Laterality: N/A;  . EYE SURGERY     LASER + SURG BIL   . GAS/FLUID EXCHANGE Right 09/17/2013   Procedure: GAS/FLUID EXCHANGE;  Surgeon: Hayden Pedro, MD;  Location: [facility];  Service: Ophthalmology;  Laterality: Right;  . gastric sleeve surgery  08/17/2016  . HIP ARTHROPLASTY Left   . INTRAOCULAR LENS INSERTION Bilateral    lens disease due to homocysteinuria  . NASAL SEPTUM SURGERY  march 2016   @ [facility]  . PARS PLANA VITRECTOMY Right 09/17/2013   Procedure: PARS PLANA VITRECTOMY WITH 25G REMOVAL/SUTURE SECONDARY INTRAOCULAR LENS;  Surgeon: Hayden Pedro, MD;  Location: [facility];  Service: Ophthalmology;  Laterality: Right;  . PHOTOCOAGULATION Right 09/17/2013   Procedure: PHOTOCOAGULATION;  Surgeon: Hayden Pedro, MD;  Location: [facility];  Service: Ophthalmology;  Laterality: Right;  HEADSCOPE LASER  . scapholunate ligament reconstruction of right hand  2011  . UMBILICAL HERNIA REPAIR N/A 05/07/2015   Procedure: HERNIA REPAIR UMBILICAL ADULT;  Surgeon: Florene Glen, MD;  Location: [facility];  Service: General;  Laterality: N/A;  . WRIST SURGERY Right    Active Ambulatory Problems    Diagnosis Date Noted  . 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 HYPERLIPIDEMIA 05/28/2008  . ANXIETY 05/28/2008  . OBSESSIVE-COMPULSIVE DISORDER 05/28/2008  . DDD (degenerative disc disease), lumbosacral 07/04/2013  . HTN (hypertension) 07/04/2013  . Intraocular lens dislocation 09/12/2013  . CVA (cerebral infarction) 08/14/2014  . Diplopia 08/15/2014  . Sinus tachycardia 08/15/2014   . Paresthesia 06/10/2015  . Alcohol-induced mood disorder ([place])   . OCD (obsessive compulsive disorder)   . Arthritis 04/26/2018  . Arthritis of knee 04/26/2018  . Bilateral cataracts 04/26/2018  . Class 1 obesity 04/26/2018  . Disorder of lens 04/26/2018  .  Exomphalos 04/26/2018  . History of total hip arthroplasty 07/05/2017  . Major depressive disorder, single episode 07/03/2009  . Neuropathy 04/04/2014  . OSA on CPAP 04/26/2018  . Prediabetes 04/26/2018  . S/P laparoscopic sleeve gastrectomy 08/17/2016  . Spinal stenosis of lumbar region 04/25/2017  . Trochanteric bursitis 02/27/2017   Resolved Ambulatory Problems    Diagnosis Date Noted  . No Resolved Ambulatory Problems   Past Medical History:  Diagnosis Date  . Anxiety   . Avascular necrosis ([place])   . Brain bleed ([place])   . Cataract   . Depression   . Family history of adverse reaction to anesthesia   . GERD (gastroesophageal reflux disease)   . 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 Hypertension   . Idiopathic progressive polyneuropathy   . Lens disease   . Neuromuscular disorder ([place])   . Obesity, Class II, BMI 35-39.9   . Sleep apnea   . Stroke ([place])   . Umbilical hernia    Constitutional Exam  General appearance: alert, cooperative and moderately obese Vitals:   10/04/18 0824  BP: 114/83  Pulse: 80  Resp: 18  Temp: 98.5 F (36.9 C)  TempSrc: Oral  SpO2: 97%  Weight: 282 lb (127.9 kg)  Height: 6' 3" (1.905 m)   BMI Assessment: Estimated body mass index is 35.25 kg/m as calculated from the following:   Height as of this encounter: 6' 3" (1.905 m).   Weight as of this encounter: 282 lb (127.9 kg).  BMI interpretation table: BMI level Category Range association with higher incidence of chronic pain  <18 kg/m2 Underweight   18.5-24.9 kg/m2 Ideal body weight   25-29.9 kg/m2 Overweight Increased incidence by 20%  30-34.9 kg/m2 Obese (Class I) Increased incidence by 68%  35-39.9 kg/m2 Severe obesity (Class II)  Increased incidence by 136%  >40 kg/m2 Extreme obesity (Class III) Increased incidence by 254%   Patient's current BMI Ideal Body weight  Body mass index is 35.25 kg/m. Ideal body weight: 84.5 kg (186 lb 4.6 oz) Adjusted ideal body weight: 101.9 kg (224 lb 9.2 oz)   BMI Readings from Last 4 Encounters:  10/04/18 35.25 kg/m  09/21/18 35.72 kg/m  09/19/18 36.50 kg/m  09/18/18 36.50 kg/m   Wt Readings from Last 4 Encounters:  10/04/18 282 lb (127.9 kg)  09/21/18 285 lb 12.8 oz (129.6 kg)  09/19/18 292 lb (132.5 kg)  09/18/18 292 lb (132.5 kg)  Psych/Mental status: Alert, oriented x 3 (person, place, & time)       Eyes: PERLA Respiratory: No evidence of acute respiratory distress  Cervical Spine Area Exam  Skin & Axial Inspection: No masses, redness, edema, swelling, or associated skin lesions Alignment: Symmetrical Functional ROM: Unrestricted ROM      Stability: No instability detected Muscle Tone/Strength: Functionally intact. No obvious neuro-muscular anomalies detected. Sensory (Neurological): Unimpaired Palpation: No palpable anomalies              Upper Extremity (UE) Exam    Side: Right upper extremity  Side: Left upper extremity  Skin & Extremity Inspection: Skin color, temperature, and hair growth are WNL. No peripheral edema or cyanosis. No masses, redness, swelling, asymmetry, or associated skin lesions. No contractures.  Skin & Extremity Inspection: Left forearm in splint  Functional ROM: Unrestricted ROM          Functional ROM: Did not assess          Muscle Tone/Strength: Functionally intact. No obvious neuro-muscular anomalies detected.  Muscle Tone/Strength: Functionally intact.  No obvious neuro-muscular anomalies detected.  Sensory (Neurological): Neuropathic pain pattern affecting the wrist and hand  Sensory (Neurological): Neuropathic pain pattern affecting the wrist and hand and musculoskeletal  Palpation: No palpable anomalies              Palpation: No  palpable anomalies              Provocative Test(s):  Phalen's test: deferred Tinel's test: deferred Apley's scratch test (touch opposite shoulder):  Action 1 (Across chest): deferred Action 2 (Overhead): deferred Action 3 (LB reach): deferred   Provocative Test(s):  Phalen's test: deferred Tinel's test: deferred Apley's scratch test (touch opposite shoulder):  Action 1 (Across chest): deferred Action 2 (Overhead): deferred Action 3 (LB reach): deferred    Thoracic Spine Area Exam  Skin & Axial Inspection: No masses, redness, or swelling Alignment: Symmetrical Functional ROM: Unrestricted ROM Stability: No instability detected Muscle Tone/Strength: Functionally intact. No obvious neuro-muscular anomalies detected. Sensory (Neurological): Unimpaired Muscle strength & Tone: No palpable anomalies  Lumbar Spine Area Exam  Skin & Axial Inspection: No masses, redness, or swelling Alignment: Symmetrical Functional ROM: Unrestricted ROM       Stability: No instability detected Muscle Tone/Strength: Functionally intact. No obvious neuro-muscular anomalies detected. Sensory (Neurological): Unimpaired Palpation: No palpable anomalies       Provocative Tests: Hyperextension/rotation test: deferred today       Lumbar quadrant test (Kemp's test): deferred today       Lateral bending test: deferred today       Patrick's Maneuver: deferred today                   FABER test: deferred today                   S-I anterior distraction/compression test: deferred today         S-I lateral compression test: deferred today         S-I Thigh-thrust test: deferred today         S-I Gaenslen's test: deferred today          Gait & Posture Assessment  Ambulation: Unassisted Gait: Relatively normal for age and body habitus Posture: WNL   Lower Extremity Exam    Side: Right lower extremity  Side: Left lower extremity  Stability: No instability observed          Stability: No instability  observed          Skin & Extremity Inspection: Skin color, temperature, and hair growth are WNL. No peripheral edema or cyanosis. No masses, redness, swelling, asymmetry, or associated skin lesions. No contractures.  Skin & Extremity Inspection: Skin color, temperature, and hair growth are WNL. No peripheral edema or cyanosis. No masses, redness, swelling, asymmetry, or associated skin lesions. No contractures.  Functional ROM: Decreased ROM for all joints of the lower extremity          Functional ROM: Decreased ROM for all joints of the lower extremity          Muscle Tone/Strength: Functionally intact. No obvious neuro-muscular anomalies detected.  Muscle Tone/Strength: Functionally intact. No obvious neuro-muscular anomalies detected.  Sensory (Neurological): Neuropathic pain pattern  Sensory (Neurological): Neuropathic pain pattern  Palpation: No palpable anomalies  Palpation: No palpable anomalies  4 out of 5 strength bilateral lower extremity: Plantar flexion, dorsiflexion, knee flexion, knee extension.  Assessment  Primary Diagnosis & Pertinent Problem List: The primary encounter diagnosis was Spinal stenosis of lumbar region, unspecified whether neurogenic  claudication present. 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  Visit Diagnosis (New problems to examiner): 1. Spinal stenosis of lumbar region, unspecified whether neurogenic claudication present   2. 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 DDD (degenerative disc disease), lumbosacral   4. OSA on CPAP   5. Obsessive-compulsive disorder, unspecified type   6. History of total hip replacement, unspecified laterality    7. Current episode of major depressive disorder without prior episode, unspecified depression episode severity   General Recommendations: The pain condition that the patient suffers from is best treated with a multidisciplinary approach that involves an increase in physical activity to prevent de-conditioning and worsening of the pain cycle, as well as psychological counseling (formal and/or informal) to address the co-morbid psychological affects of pain. Treatment will often involve judicious use of pain medications and interventional procedures to decrease the pain, allowing the patient to participate in the physical activity that will ultimately produce long-lasting pain reductions. The goal of the multidisciplinary approach is to return the patient to a higher level of overall function and to restore their ability to perform activities of daily living.  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  Patient is tried various neuropathic medications including Lyrica 150 mg twice daily which resulted in blurry vision.  Patient has a history of multiple eye surgeries including lens implants and replacements.  Patient is also tried Topamax, Cymbalta which resulted in side effects of nausea and were primarily ineffective.  He does find benefit with gabapentin 1200 mg twice daily.  Patient has been evaluated at [facility] in [place] for spinal cord stimulation but was not deemed a candidate.  In regards to treatment options, we discussed adding alpha lipoic acid 600 mg daily.  This can be effective for neuropathic pain and can be found over-the-counter.  Given that the patient is tried various medications including multiple neuropathic's, he states that he would like to be considered for chronic opioid therapy.  I informed the patient that he must find an alternative to Xanax and refrain from any benzodiazepines if  he wants to be considered for chronic opioid therapy especially in the context of his depression, OCD, obstructive sleep apnea which places him at higher risk.  Patient endorsed understanding.  Patient could be a candidate for buprenorphine therapy and possibly even levorphanol with its NMDA antagonism that could be effective for his neuropathic pain.  Patient can also be a candidate for lidocaine infusion.  He denies any significant cardiac history.  We will need to obtain EKG prior to infusion.  Note: Please be advised that as per protocol, today's visit has been an evaluation only. We have not taken over the patient's controlled substance management.  Pharmacological management options:  Opioid Analgesics: The patient was informed that there is no guarantee that he would be a candidate for opioid analgesics. The decision will be made following CDC guidelines. This decision will be based on the results of diagnostic studies, as well as Jesus Crosby's risk profile.   Membrane stabilizer: Tried and failed only finds benefit with gabapentin 1200 mg twice daily  Muscle relaxant: Not indicated  NSAID: Tried and failed  Other analgesic(s): To be determined at  a later time   Interventional management options: Jesus Crosby was informed that there is no guarantee that he would be a candidate for interventional therapies. The decision will be based on the results of diagnostic studies, as well as Jesus Crosby's risk profile.  Procedure(s) under consideration:  Lidocaine infusion   Provider-requested follow-up: Return if symptoms worsen or fail to improve.  Future Appointments  Date Time Provider [place]  11/15/2018  8:45 AM Versie Starks, Audie Clear, NP [place] None    Primary Care Physician: Ronnell Freshwater, NP Location: [facility] Note by: Gillis Santa, M.D, Date: 10/04/2018; Time: 9:13 AM  Patient Instructions  It was nice meeting you today.  As we discussed, since  you have tried so many analgesics, opioid therapy may be a consideration for your chronic refractory neuropathic pain.  Free to be considered for chronic opioid therapy, he must discontinue and refrain from chronic benzodiazepines especially in your case with obstructive sleep apnea being on CPAP.  We discussed alpha lipoic acid that you can take 600 mg daily for your neuropathic pain (OVER THE COUNTER).  Please give this medication 3 to 4 weeks.  We also discussed a lidocaine infusion.  If we were to proceed with a lidocaine infusion, you will need an EKG prior.

## 2018-10-04 NOTE — Progress Notes (Signed)
Safety precautions to be maintained throughout the outpatient stay will include: orient to surroundings, keep bed in low position, maintain call bell within reach at all times, provide assistance with transfer out of bed and ambulation.  

## 2018-10-04 NOTE — H&P (Signed)
[name] NAME: Jesus Crosby    MR#:  [id number]  DATE OF BIRTH:  [birth year]  DATE OF ADMISSION:  10/04/2018  PRIMARY CARE PHYSICIAN: Ronnell Freshwater, NP   REQUESTING/REFERRING PHYSICIAN: Dr Arta Silence  CHIEF COMPLAINT:  Difficulty getting his words out and blurred vision going over the last week.  HISTORY OF PRESENT ILLNESS:  Jesus Crosby  is a 52 y.o. male with a known history of homocystinuria and stroke at age 3 presents with blurred vision going on for over the last week.  Also having worsening slurred speech and trouble putting his words together.  He had similar symptoms when he had his previous stroke.  Patient saw the pain management doctor today and then came to the ER.  Patient states he has not slept in a while only sleeps a couple hours at a time.  And takes trazodone.  PAST MEDICAL HISTORY:   Past Medical History:  Diagnosis Date  . Anxiety   . Arthritis    knees,Right wrist  . Avascular necrosis ([place])   . Brain bleed ([place])   . Cataract    bilateral repair with lens implants  . Depression   . Family history of adverse reaction to anesthesia    n/v-mom  . GERD (gastroesophageal reflux disease)   . Homocystinuria ([place])   . Hypertension   . Idiopathic progressive polyneuropathy   . Lens disease   . Neuromuscular disorder ([place])   . Obesity, Class II, BMI 35-39.9   . OCD (obsessive compulsive disorder)   . Paresthesia 06/10/2015  . Sleep apnea   . Stroke [facility])    with brain bleed at age 55-no deficits  . Umbilical hernia     PAST SURGICAL HISTORY:   Past Surgical History:  Procedure Laterality Date  . COLONOSCOPY WITH PROPOFOL N/A 07/30/2015   Procedure: COLONOSCOPY WITH PROPOFOL;  Surgeon: [name], MD;  Location: [facility];  Service: Endoscopy;  Laterality: N/A;  . DERMABRASION OF FACE     due to acne scars  . distal radius fracture of right hand (plates, screws, and pins)   2011  . ESOPHAGOGASTRODUODENOSCOPY (EGD) WITH PROPOFOL N/A 07/30/2015   Procedure: ESOPHAGOGASTRODUODENOSCOPY (EGD) WITH PROPOFOL;  Surgeon: [name], MD;  Location: [facility];  Service: Endoscopy;  Laterality: N/A;  . EYE SURGERY     LASER + SURG BIL   . GAS/FLUID EXCHANGE Right 09/17/2013   Procedure: GAS/FLUID EXCHANGE;  Surgeon: Hayden Pedro, MD;  Location: [place];  Service: Ophthalmology;  Laterality: Right;  . gastric sleeve surgery  08/17/2016  . HIP ARTHROPLASTY Left   . INTRAOCULAR LENS INSERTION Bilateral    lens disease due to homocysteinuria  . NASAL SEPTUM SURGERY  march 2016   @ [facility]  . PARS PLANA VITRECTOMY Right 09/17/2013   Procedure: PARS PLANA VITRECTOMY WITH 25G REMOVAL/SUTURE SECONDARY INTRAOCULAR LENS;  Surgeon: Hayden Pedro, MD;  Location: [place];  Service: Ophthalmology;  Laterality: Right;  . PHOTOCOAGULATION Right 09/17/2013   Procedure: PHOTOCOAGULATION;  Surgeon: Hayden Pedro, MD;  Location: [place];  Service: Ophthalmology;  Laterality: Right;  HEADSCOPE LASER  . scapholunate ligament reconstruction of right hand  2011  . UMBILICAL HERNIA REPAIR N/A 05/07/2015   Procedure: HERNIA REPAIR UMBILICAL ADULT;  Surgeon: Florene Glen, MD;  Location: [facility];  Service: General;  Laterality: N/A;  . WRIST SURGERY Right     SOCIAL HISTORY:   Social History  Tobacco Use  . Smoking status: Never Smoker  . Smokeless tobacco: Never Used  Substance Use Topics  . Alcohol use: No    Alcohol/week: 0.0 standard drinks    FAMILY HISTORY:   Family History  Problem Relation Age of Onset  . Breast cancer Mother   . Hypertension Mother   . Hypertension Father   . Stroke Maternal Grandmother   . Stroke Maternal Grandfather   . Stroke Paternal Grandmother   . Stroke Paternal Grandfather   . Stroke Other     DRUG ALLERGIES:   Allergies  Allergen Reactions  . Pineapple Flavor Swelling    Lip swell  . Pineapple Swelling    Lips swelling  .  Amoxicillin Nausea And Vomiting    REACTION: Nausea and vomiting  . Penicillins     REVIEW OF SYSTEMS:  CONSTITUTIONAL: No fever.  Positive chills and sweats.  Positive weight loss.  Positive for fatigue.  EYES: Positive for blurred vision going on for a week EARS, NOSE, AND THROAT: No tinnitus or ear pain.  Positive for dysphasia and dry mouth.  No sore throat. RESPIRATORY: Occasional cough and shortness of breath.  No wheezing or hemoptysis.  CARDIOVASCULAR: No chest pain, orthopnea, edema.  GASTROINTESTINAL: No nausea, vomiting.  Positive for diarrhea and abdominal pain.  Has had bright red blood per rectum GENITOURINARY: No dysuria, hematuria.  ENDOCRINE: No polyuria, nocturia,  HEMATOLOGY: No anemia, easy bruising or bleeding SKIN: No rash or lesion. MUSCULOSKELETAL: Positive for joint aches and pains NEUROLOGIC: Positive for difficulty getting his words out, positive for dizziness PSYCHIATRY: History of anxiety and depression.   MEDICATIONS AT HOME:   Prior to Admission medications   Medication Sig Start Date End Date Taking? Authorizing Provider  Acetylcysteine (NAC) 600 MG CAPS Take 600 mg by mouth. 3 at bedtime    [provider]  ALPRAZolam Duanne Moron) 0.5 MG tablet Take 0.5 mg by mouth 3 (three) times daily.    [provider]  amLODipine (NORVASC) 5 MG tablet TAKE 1 TABLET(5 MG) BY MOUTH TWICE DAILY 08/30/18   Ronnell Freshwater, NP  aspirin 81 MG chewable tablet Chew by mouth daily.    [provider]  atorvastatin (LIPITOR) 10 MG tablet Take 1 tablet (10 mg total) by mouth daily. 01/24/18   [name], MD  Betaine [facility]) POWD Take 8 Scoops by mouth 3 (three) times daily.    [provider]  Cyanocobalamin (VITAMIN B 12 PO) Take 1,000 mcg by mouth.    [provider]  dicyclomine (BENTYL) 10 MG capsule Take 1 capsule (10 mg total) by mouth every 6 (six) hours as needed (abdominal pain, cramping or diarrhea). Patient not  taking: Reported on 10/04/2018 09/21/18   Esterwood, Amy S, PA-C  folic acid (FOLVITE) 295 MCG tablet Take 400 mcg by mouth at bedtime. 3 tablets at bedtime.    [provider]  furosemide (LASIX) 20 MG tablet Take 1 tablet (20 mg total) by mouth every morning. 09/18/18   Ronnell Freshwater, NP  gabapentin (NEURONTIN) 600 MG tablet Take 600 mg by mouth at bedtime. 2 in a.m. ,2 at bedtime    [provider]  losartan (COZAAR) 50 MG tablet Take half tab po qd 01/24/18   [name], MD  metoprolol tartrate (LOPRESSOR) 50 MG tablet Take 1.5 tablets (75 mg total) by mouth 2 (two) times daily. 08/27/18   Ronnell Freshwater, NP  NONFORMULARY OR COMPOUNDED ITEM See pharmacy note Patient not  taking: Reported on 10/04/2018 04/27/18   Edrick Kins, DPM  OLANZapine (ZYPREXA) 10 MG tablet Take 10 mg by mouth at bedtime.    [provider]  ondansetron (ZOFRAN) 4 MG tablet TK 1 T PO Q 6 TO 8 H PRN N 04/10/18   [provider]  Peppermint Oil (IBGARD PO) Take by mouth every 4 (four) hours as needed.    [provider]  pregabalin (LYRICA) 150 MG capsule Take 150 mg by mouth 2 (two) times daily.    [provider]  pyridOXINE (VITAMIN B-6) 100 MG tablet Take 500 mg by mouth at bedtime.     [provider]  sertraline (ZOLOFT) 100 MG tablet Take 150 mg by mouth daily.    [provider]  traZODone (DESYREL) 100 MG tablet Take 200 mg by mouth at bedtime.    [provider]      VITAL SIGNS:  Blood pressure 100/74, pulse 86, temperature 98.3 F (36.8 C), temperature source Oral, resp. rate (!) 21, height 6\' 3"  (1.905 m), weight 127.9 kg, SpO2 90 %.  PHYSICAL EXAMINATION:  GENERAL:  52 y.o.-year-old patient lying in the bed with no acute distress.  EYES: Pupils equal, round, reactive to light and accommodation. No scleral icterus. Extraocular muscles intact.  HEENT: Head atraumatic, normocephalic. Oropharynx and nasopharynx clear.   NECK:  Supple, no jugular venous distention. No thyroid enlargement, no tenderness.  LUNGS: Normal breath sounds bilaterally, no wheezing, rales,rhonchi or crepitation. No use of accessory muscles of respiration.  CARDIOVASCULAR: S1, S2 normal. No murmurs, rubs, or gallops.  ABDOMEN: Soft, nontender, nondistended. Bowel sounds present. No organomegaly or mass.  EXTREMITIES: No pedal edema, cyanosis, or clubbing.  NEUROLOGIC: Cranial nerves II through XII are intact. Muscle strength 5/5 in all extremities. Sensation intact. Gait not checked.  PSYCHIATRIC: The patient is alert and oriented x 3.  SKIN: No rash, lesion, or ulcer.   LABORATORY PANEL:   CBC Recent Labs  Lab 10/04/18 1618  WBC 7.5  HGB 14.4  HCT 42.8  PLT 210   ------------------------------------------------------------------------------------------------------------------  Chemistries  Recent Labs  Lab 10/04/18 1618  NA 133*  K 4.4  CL 92*  CO2 29  GLUCOSE 83  BUN 10  CREATININE 1.10  CALCIUM 9.5  AST 57*  ALT 56*  ALKPHOS 54  BILITOT 0.9   ------------------------------------------------------------------------------------------------------------------  Cardiac Enzymes Recent Labs  Lab 10/04/18 1618  TROPONINI <0.03   ------------------------------------------------------------------------------------------------------------------  RADIOLOGY:  Ct Head Wo Contrast  Result Date: 10/04/2018 CLINICAL DATA:  Slurred speech and unsteady gait. Headache and blurred vision for a week. EXAM: CT HEAD WITHOUT CONTRAST TECHNIQUE: Contiguous axial images were obtained from the base of the skull through the vertex without intravenous contrast. COMPARISON:  Apr 29, 2016 FINDINGS: Brain: No subdural, epidural, or subarachnoid hemorrhage. No mass effect or midline shift. Ventricles and sulci are normal. Cerebellum, brainstem, and basal cisterns are within normal limits. No acute cortical ischemia or infarct  identified. Vascular: No hyperdense vessel or unexpected calcification. Skull: Normal. Negative for fracture or focal lesion. Sinuses/Orbits: No acute finding. Other: None. IMPRESSION: No acute intracranial abnormalities. Electronically Signed   By: Dorise Bullion III M.D   On: 10/04/2018 17:05    EKG:   Normal sinus rhythm 80 bpm flipped T waves laterally  IMPRESSION AND PLAN:   1.  Aphasia and blurred vision.  Patient states this is similar to the stroke symptoms that he has had in the past.  With his homocystinuria he  could develop a venous thrombosis.  I will get an MRI of the brain and an MRA of the brain.  Echocardiogram with bubble study, carotid ultrasound and monitor on telemetry.  PT, OT and neurology consultations.  Continue aspirin full dose at this point and increase Lipitor to 40 mg daily and check a lipid profile.  2.  Homocystinuria.  Continue the patient's NAC, B12, Cystadane and B6 and folic acid supplementation 3.  Insomnia and severe depression continue his psychiatric medications 4.  History of hypertension.  Hold all antihypertensives at this time.  Blood pressure on the lower side which could be contributing to symptoms.  Allow permissive hypertension.  Gentle IV fluids. 5.  Progressive poly-neuropathy on both gabapentin and Lyrica. 6.  Hyperlipidemia unspecified check a lipid profile in the morning increase Lipitor to 40 mg 7.  Obesity and sleep apnea 8.  History of IBS 9.  History of avascular necrosis  All the records are reviewed and case discussed with ED provider. Management plans discussed with the patient, family and they are in agreement.  CODE STATUS: full code  TOTAL TIME TAKING CARE OF THIS PATIENT: 50 minutes.    Loletha Grayer M.D on 10/04/2018 at 6:54 PM  Between 7am to 6pm - Pager - [phone]  After 6pm call admission pager [phone]  [facility] Office  [phone]  CC: Primary care physician; Ronnell Freshwater, NP

## 2018-10-05 ENCOUNTER — Inpatient Hospital Stay: Payer: Medicare HMO

## 2018-10-05 ENCOUNTER — Inpatient Hospital Stay
Admit: 2018-10-05 | Discharge: 2018-10-05 | Disposition: A | Discharge status: Home / Self Care | Payer: Medicare HMO | Attending: Internal Medicine | Admitting: Internal Medicine

## 2018-10-05 DIAGNOSIS — R479 Unspecified speech disturbances: Secondary | ICD-10-CM

## 2018-10-05 LAB — BASIC METABOLIC PANEL
ANION GAP: 9 (ref 5–15)
BUN: 10 mg/dL (ref 6–20)
CHLORIDE: 99 mmol/L (ref 98–111)
CO2: 29 mmol/L (ref 22–32)
Calcium: 8.9 mg/dL (ref 8.9–10.3)
Creatinine, Ser: 0.99 mg/dL (ref 0.61–1.24)
GFR calc non Af Amer: >60 mL/min (ref >60)
Glucose, Bld: 87 mg/dL (ref 70–99)
POTASSIUM: 4.1 mmol/L (ref 3.5–5.1)
SODIUM: 137 mmol/L (ref 135–145)

## 2018-10-05 LAB — CBC
HEMATOCRIT: 37.8 % — AB (ref 39.0–52.0)
HEMOGLOBIN: 12.6 g/dL — AB (ref 13.0–17.0)
MCH: 29.9 pg (ref 26.0–34.0)
MCHC: 33.3 g/dL (ref 30.0–36.0)
MCV: 89.8 fL (ref 80.0–100.0)
NRBC: 0 % (ref 0.0–0.2)
Platelets: 174 10*3/uL (ref 150–400)
RBC: 4.21 MIL/uL — AB (ref 4.22–5.81)
RDW: 13.2 % (ref 11.5–15.5)
WBC: 5.3 10*3/uL (ref 4.0–10.5)

## 2018-10-05 LAB — LIPID PANEL
Cholesterol: 211 mg/dL — ABNORMAL HIGH (ref 0–200)
HDL: 28 mg/dL — AB (ref >40)
LDL Cholesterol: UNDETERMINED mg/dL (ref 0–99)
TRIGLYCERIDES: 409 mg/dL — AB (ref <150)
Total CHOL/HDL Ratio: 7.5 RATIO
VLDL: UNDETERMINED mg/dL (ref 0–40)

## 2018-10-05 MED ORDER — SERTRALINE HCL 50 MG PO TABS: 200.0000 mg | ORAL_TABLET | Freq: Every day | ORAL | Status: DC

## 2018-10-05 MED ORDER — LORAZEPAM 2 MG/ML IJ SOLN
INTRAMUSCULAR | Status: AC
  Filled 2018-10-05: qty 1

## 2018-10-05 MED ORDER — METOPROLOL TARTRATE 50 MG PO TABS
75.0000 mg | ORAL_TABLET | Freq: Two times a day (BID) | ORAL | Status: DC
  Administered 2018-10-05 – 2018-10-07 (×5): 75 mg via ORAL
  Filled 2018-10-05 (×6): qty 1

## 2018-10-05 MED ORDER — AMLODIPINE BESYLATE 5 MG PO TABS
5.0000 mg | ORAL_TABLET | Freq: Two times a day (BID) | ORAL | Status: DC
  Administered 2018-10-05 – 2018-10-07 (×4): 5 mg via ORAL
  Filled 2018-10-05 (×5): qty 1

## 2018-10-05 MED ORDER — PREGABALIN 50 MG PO CAPS
50.0000 mg | ORAL_CAPSULE | Freq: Two times a day (BID) | ORAL | Status: DC
  Administered 2018-10-05 – 2018-10-07 (×4): 50 mg via ORAL
  Filled 2018-10-05 (×5): qty 1

## 2018-10-05 MED ORDER — DICYCLOMINE HCL 10 MG PO CAPS
10.0000 mg | ORAL_CAPSULE | Freq: Four times a day (QID) | ORAL | Status: DC
  Administered 2018-10-05 – 2018-10-07 (×8): 10 mg via ORAL
  Filled 2018-10-05 (×11): qty 1

## 2018-10-05 MED ORDER — METOPROLOL TARTRATE 5 MG/5ML IV SOLN
INTRAVENOUS | Status: AC
  Administered 2018-10-05: 5 mg
  Filled 2018-10-05: qty 5

## 2018-10-05 MED ORDER — ZOLPIDEM TARTRATE 5 MG PO TABS: 10.0000 mg | ORAL_TABLET | Freq: Every evening | ORAL | Status: DC | PRN

## 2018-10-05 MED ORDER — FUROSEMIDE 20 MG PO TABS
20.0000 mg | ORAL_TABLET | Freq: Every day | ORAL | Status: DC
  Administered 2018-10-06 – 2018-10-07 (×2): 20 mg via ORAL
  Filled 2018-10-05 (×4): qty 1

## 2018-10-05 MED ORDER — LORAZEPAM 2 MG/ML IJ SOLN: 0.5000 mg | INTRAMUSCULAR | Status: DC | PRN

## 2018-10-05 MED ORDER — LORAZEPAM 2 MG/ML IJ SOLN
0.5000 mg | Freq: Once | INTRAMUSCULAR | Status: AC
  Administered 2018-10-05: 20:00:00 0.5 mg via INTRAVENOUS
  Filled 2018-10-05: qty 1

## 2018-10-05 MED ORDER — LORAZEPAM 2 MG/ML IJ SOLN
0.5000 mg | Freq: Once | INTRAMUSCULAR | Status: AC
  Administered 2018-10-05: 20:00:00 0.5 mg via INTRAVENOUS

## 2018-10-05 MED ORDER — LOSARTAN POTASSIUM 25 MG PO TABS
25.0000 mg | ORAL_TABLET | Freq: Every day | ORAL | Status: DC
  Administered 2018-10-06 – 2018-10-07 (×2): 25 mg via ORAL
  Filled 2018-10-05 (×4): qty 1

## 2018-10-05 NOTE — Progress Notes (Signed)
Physical Therapy Evaluation Patient Details Name: Jesus Crosby. MRN: [id number] DOB: Oct 20, 1966 Today's Date: 10/05/2018   History of Present Illness  Pt is 52 y/o male presenting with blurred vision, HA, unsteadiness, and difficulty come up with words. Pt admitted for aphasia and blurred vision on 10/04/18. PMH includes CVA at age 21, AVN L hip and L wrist, neuromuscular disorder, idiopathic progressive polyneuropathy, HTN, paresthesia, OCD and GERD among other comorbidities. Imaging showed no acute intracranial abnormalities, possible intracranial HTN appearance is stable, minor intracranial atherosclerotic changes for pt age, no large vessel occlusion, no hemodynamically significant stenosis, and <50% stenosis in right and left ICA.  Clinical Impression  Pt is a pleasant 52 year old male who was admitted for aphasia. Pt performs bed mobility Mod I, transfers and ambulation with CGA. Pt demonstrates safe bed mobility and transfers. When ambulating is unsteady on feet without apparent LOB, does require minimum of single UE support to improve steadiness (IV pole used). Pt noted to have decreased sensation to light touch bilaterally from L3-S1. Per pt feet and tips of fingers constantly experiences burning N/T sensations. Coordination tested, UE and LEs intact. Pt denies any visual problems this time. Pt demonstrates deficits with balance, mobility, sensation, and pain. Pt is not at his baseline. Would benefit from skilled PT to address above deficits and promote optimal return to PLOF. Upon dc from acute hospitalization, recommending HHPT and a shower bench for safety with hygiene d/t unsteadiness on feet.    Follow Up Recommendations Home health PT    Equipment Recommendations  Other (comment) (Tub bench)    Recommendations for Other Services       Precautions / Restrictions Precautions Precautions: Fall Precaution Comments: Noted cast on L wrist d/t AVN, avoided Wbing on L wrist t/o  session. Also has H/O L hip AVN. Per pt does not have WBing precautions. Restrictions Weight Bearing Restrictions: No      Mobility  Bed Mobility Overal bed mobility: Modified Independent             General bed mobility comments: Pt performs safely requiring increased time to perform  Transfers Overall transfer level: Needs assistance Equipment used: Rolling walker (2 wheeled) Transfers: Sit to/from Stand Sit to Stand: Min guard;From elevated surface         General transfer comment: Pt performs with good body mechanics, no apparent LOB, no dizziness reported. Performed with elevated surface d/t history of L hip AVN  Ambulation/Gait Ambulation/Gait assistance: Min guard Gait Distance (Feet): 60 Feet Assistive device: IV Pole Gait Pattern/deviations: Step-through pattern Gait velocity: decreased   General Gait Details: Pt ambulates with reciprocal gait. Noted to have occassional decreased dorsiflexion R>L.  Also noted occassional unsteadiness on feet, but no LOB. Uses IV pole for RUE support with ambulation.  Stairs            Wheelchair Mobility    Modified Rankin (Stroke Patients Only)       Balance Overall balance assessment: Needs assistance Sitting-balance support: No upper extremity supported;Feet supported Sitting balance-Leahy Scale: Good Sitting balance - Comments: Pt demonstrates good trunk control and tolerates mod-max weight shift without LOB.   Standing balance support: Single extremity supported;During functional activity Standing balance-Leahy Scale: Fair                               Pertinent Vitals/Pain Pain Assessment: 0-10 Pain Score: 7  Pain Location: Bilateral feet sometimes up into  LEs, also tips of fingers Pain Descriptors / Indicators: Burning;Numbness;Pins and needles;Tingling Pain Intervention(s): Limited activity within patient's tolerance;Monitored during session    [place] expects to be  discharged to:: Private residence Living Arrangements: Alone Available Help at Discharge: Family;Available PRN/intermittently Type of Home: Apartment Home Access: Stairs to enter Entrance Stairs-Rails: Right;Left(Uses railing with R hand only d/t AVN) Entrance Stairs-Number of Steps: 12 Home Layout: One level Home Equipment: Walker - 2 wheels;Cane - single point;Toilet riser      Prior Function Level of Independence: Independent with assistive device(s)         Comments: Per pt is independent with amb, ADL, and IADL. Reports that he only take showers every 4-5 days d/t fear of falling. Does not drive d/t neuropathy     Hand Dominance   Dominant Hand: Right    Extremity/Trunk Assessment   Upper Extremity Assessment Upper Extremity Assessment: Overall WFL for tasks assessed    Lower Extremity Assessment Lower Extremity Assessment: Overall WFL for tasks assessed       Communication   Communication: No difficulties(Some slurred speech)  Cognition Arousal/Alertness: Awake/alert Behavior During Therapy: WFL for tasks assessed/performed Overall Cognitive Status: Within Functional Limits for tasks assessed                                        General Comments General comments (skin integrity, edema, etc.): Supine BP measured 154/95, with standing activity 138/93.    Exercises     Assessment/Plan    PT Assessment Patient needs continued PT services  PT Problem List Decreased balance;Decreased mobility;Impaired sensation;Pain       PT Treatment Interventions DME instruction;Gait training;Stair training;Functional mobility training;Therapeutic activities;Therapeutic exercise;Balance training;Neuromuscular re-education;Patient/family education    PT Goals (Current goals can be found in the Care Plan section)  Acute Rehab PT Goals Patient Stated Goal: To increase independence PT Goal Formulation: With patient Time For Goal Achievement:  10/19/18 Potential to Achieve Goals: Fair    Frequency Min 2X/week   Barriers to discharge        Co-evaluation               AM-PAC PT "6 Clicks" Daily Activity  Outcome Measure Difficulty turning over in bed (including adjusting bedclothes, sheets and blankets)?: None Difficulty moving from lying on back to sitting on the side of the bed? : A Little Difficulty sitting down on and standing up from a chair with arms (e.g., wheelchair, bedside commode, etc,.)?: A Little Help needed moving to and from a bed to chair (including a wheelchair)?: A Little Help needed walking in hospital room?: A Little Help needed climbing 3-5 steps with a railing? : A Little 6 Click Score: 19    End of Session Equipment Utilized During Treatment: Gait belt Activity Tolerance: Patient tolerated treatment well Patient left: in bed;with call bell/phone within reach;with family/visitor present;with bed alarm set Nurse Communication: Mobility status PT Visit Diagnosis: Unsteadiness on feet (R26.81);Other abnormalities of gait and mobility (R26.89)    Time: 1125-1150 PT Time Calculation (min) (ACUTE ONLY): 25 min   Charges:            Algis Downs, SPT  10/05/2018, 2:47 PM

## 2018-10-05 NOTE — Progress Notes (Signed)
Jesus Crosby NAME: Jesus Crosby    MR#:  [id number]  DATE OF BIRTH:  02/01/1966  SUBJECTIVE:  came in with some visual changes in aphasia. He is speaking full sentences without any difficulty. She was with patient today. Just came back from EEG  REVIEW OF SYSTEMS:   Review of Systems  Constitutional: Negative for chills, fever and weight loss.  HENT: Negative for ear discharge, ear pain and nosebleeds.   Eyes: Negative for blurred vision, pain and discharge.  Respiratory: Negative for sputum production, shortness of breath, wheezing and stridor.   Cardiovascular: Negative for chest pain, palpitations, orthopnea and PND.  Gastrointestinal: Negative for abdominal pain, diarrhea, nausea and vomiting.  Genitourinary: Negative for frequency and urgency.  Musculoskeletal: Negative for back pain and joint pain.  Neurological: Positive for weakness. Negative for sensory change, speech change and focal weakness.  Psychiatric/Behavioral: Negative for depression and hallucinations. The patient is not nervous/anxious.    Tolerating Diet:yes Tolerating PT: pending  DRUG ALLERGIES:   Allergies  Allergen Reactions  . Pineapple Flavor Swelling    Lip swell  . Pineapple Swelling    Lips swelling  . Amoxicillin Nausea And Vomiting    REACTION: Nausea and vomiting  . Penicillins     VITALS:  Blood pressure 130/84, pulse (!) 106, temperature 97.6 F (36.4 C), temperature source Oral, resp. rate 16, height 6\' 3"  (1.905 m), weight 127.9 kg, SpO2 94 %.  PHYSICAL EXAMINATION:   Physical Exam  GENERAL:  51 y.o.-year-old patient lying in the bed with no acute distress. obese EYES: Pupils equal, round, reactive to light and accommodation. No scleral icterus. Extraocular muscles intact.  HEENT: Head atraumatic, normocephalic. Oropharynx and nasopharynx clear.  NECK:  Supple, no jugular venous distention. No thyroid enlargement, no  tenderness.  LUNGS: Normal breath sounds bilaterally, no wheezing, rales, rhonchi. No use of accessory muscles of respiration.  CARDIOVASCULAR: S1, S2 normal. No murmurs, rubs, or gallops.  ABDOMEN: Soft, nontender, nondistended. Bowel sounds present. No organomegaly or mass.  EXTREMITIES: No cyanosis, clubbing or edema b/l.    NEUROLOGIC: Cranial nerves II through XII are intact. Decreased sensation in both LE PSYCHIATRIC:  patient is alert and oriented x 3.  SKIN: No obvious rash, lesion, or ulcer.   LABORATORY PANEL:  CBC Recent Labs  Lab 10/05/18 0506  WBC 5.3  HGB 12.6*  HCT 37.8*  PLT 174    Chemistries  Recent Labs  Lab 10/04/18 1618 10/05/18 0506  NA 133* 137  K 4.4 4.1  CL 92* 99  CO2 29 29  GLUCOSE 83 87  BUN 10 10  CREATININE 1.10 0.99  CALCIUM 9.5 8.9  AST 57*  --   ALT 56*  --   ALKPHOS 54  --   BILITOT 0.9  --    Cardiac Enzymes Recent Labs  Lab 10/04/18 1618  TROPONINI <0.03   RADIOLOGY:  Ct Head Wo Contrast  Result Date: 10/04/2018 CLINICAL DATA:  Slurred speech and unsteady gait. Headache and blurred vision for a week. EXAM: CT HEAD WITHOUT CONTRAST TECHNIQUE: Contiguous axial images were obtained from the base of the skull through the vertex without intravenous contrast. COMPARISON:  Apr 29, 2016 FINDINGS: Brain: No subdural, epidural, or subarachnoid hemorrhage. No mass effect or midline shift. Ventricles and sulci are normal. Cerebellum, brainstem, and basal cisterns are within normal limits. No acute cortical ischemia or infarct identified. Vascular: No hyperdense vessel or unexpected calcification. Skull: Normal.  Negative for fracture or focal lesion. Sinuses/Orbits: No acute finding. Other: None. IMPRESSION: No acute intracranial abnormalities. Electronically Signed   By: Dorise Bullion III M.D   On: 10/04/2018 17:05   Mr Jodene Nam Head Wo Contrast  Result Date: 10/05/2018 CLINICAL DATA:  Initial evaluation for approximate 1 week history of blurry  vision, slurred speech, word difficulty. History of prior stroke. EXAM: MRI HEAD WITHOUT CONTRAST MRA HEAD WITHOUT CONTRAST TECHNIQUE: Multiplanar, multiecho pulse sequences of the brain and surrounding structures were obtained without intravenous contrast. Angiographic images of the head were obtained using MRA technique without contrast. COMPARISON:  Prior CT from earlier the same day as well as previous MRI from 06/03/2015. FINDINGS: MRI HEAD FINDINGS Brain: Cerebral volume within normal limits for age. No significant cerebral white matter changes. No abnormal foci of restricted diffusion to suggest or subacute ischemia. Gray-white matter differentiation maintained. No areas of remote cortical infarction. No acute or chronic intracranial hemorrhage. No mass lesion, midline shift or mass effect. No hydrocephalus. No extra-axial fluid collection. Incidental note made of an empty sella, similar to previous. Vascular: Major intracranial vascular flow voids maintained. Skull and upper cervical spine: Craniocervical junction normal. Upper cervical spine within normal limits. Bone marrow signal intensity normal. No scalp soft tissue abnormality. Sinuses/Orbits: Dilatation of the optic nerve sheaths noted bilaterally, similar to previous. Patient status post ocular lens replacement bilaterally. Paranasal sinuses are largely clear. No mastoid effusion. Inner ear structures grossly normal. Other: None. MRA HEAD FINDINGS ANTERIOR CIRCULATION: Distal cervical segments of the internal carotid arteries widely patent with antegrade flow. Petrous, cavernous, and supraclinoid segments widely patent without hemodynamically significant stenosis. Origin of the ophthalmic arteries patent bilaterally. ICA termini symmetric and well opacified. A1 segments patent bilaterally. Normal anterior communicating artery. Anterior cerebral arteries widely patent to their distal aspects. Short-segment mild stenoses at the proximal right M1 and  distal left M1 segments. Normal MCA bifurcations. Distal MCA branches well perfused and symmetric. POSTERIOR CIRCULATION: Vertebral arteries patent to the vertebrobasilar junction without stenosis. Left vertebral artery dominant. Posterior inferior cerebral arteries patent bilaterally. Basilar patent to its distal aspect without stenosis superior cerebral arteries patent bilaterally. Hypoplastic P1 segments with prominent bilateral posterior communicating arteries. PCAs well perfused to their distal aspects. No intracranial aneurysm or other vascular abnormality. IMPRESSION: MRI HEAD IMPRESSION: 1. No acute intracranial infarct or other abnormality identified. 2. Empty sella with dilatation of the optic nerve sheaths, suggesting possible idiopathic intracranial hypertension. Appearance is stable relative to previous exams. MRA HEAD IMPRESSION: Minor intracranial atherosclerotic change for patient age. No large vessel occlusion. No hemodynamically significant or correctable stenosis. Electronically Signed   By: Jeannine Boga M.D.   On: 10/05/2018 01:49   Mr Brain Wo Contrast  Result Date: 10/05/2018 CLINICAL DATA:  Initial evaluation for approximate 1 week history of blurry vision, slurred speech, word difficulty. History of prior stroke. EXAM: MRI HEAD WITHOUT CONTRAST MRA HEAD WITHOUT CONTRAST TECHNIQUE: Multiplanar, multiecho pulse sequences of the brain and surrounding structures were obtained without intravenous contrast. Angiographic images of the head were obtained using MRA technique without contrast. COMPARISON:  Prior CT from earlier the same day as well as previous MRI from 06/03/2015. FINDINGS: MRI HEAD FINDINGS Brain: Cerebral volume within normal limits for age. No significant cerebral white matter changes. No abnormal foci of restricted diffusion to suggest or subacute ischemia. Gray-white matter differentiation maintained. No areas of remote cortical infarction. No acute or chronic  intracranial hemorrhage. No mass lesion, midline shift or mass effect. No  hydrocephalus. No extra-axial fluid collection. Incidental note made of an empty sella, similar to previous. Vascular: Major intracranial vascular flow voids maintained. Skull and upper cervical spine: Craniocervical junction normal. Upper cervical spine within normal limits. Bone marrow signal intensity normal. No scalp soft tissue abnormality. Sinuses/Orbits: Dilatation of the optic nerve sheaths noted bilaterally, similar to previous. Patient status post ocular lens replacement bilaterally. Paranasal sinuses are largely clear. No mastoid effusion. Inner ear structures grossly normal. Other: None. MRA HEAD FINDINGS ANTERIOR CIRCULATION: Distal cervical segments of the internal carotid arteries widely patent with antegrade flow. Petrous, cavernous, and supraclinoid segments widely patent without hemodynamically significant stenosis. Origin of the ophthalmic arteries patent bilaterally. ICA termini symmetric and well opacified. A1 segments patent bilaterally. Normal anterior communicating artery. Anterior cerebral arteries widely patent to their distal aspects. Short-segment mild stenoses at the proximal right M1 and distal left M1 segments. Normal MCA bifurcations. Distal MCA branches well perfused and symmetric. POSTERIOR CIRCULATION: Vertebral arteries patent to the vertebrobasilar junction without stenosis. Left vertebral artery dominant. Posterior inferior cerebral arteries patent bilaterally. Basilar patent to its distal aspect without stenosis superior cerebral arteries patent bilaterally. Hypoplastic P1 segments with prominent bilateral posterior communicating arteries. PCAs well perfused to their distal aspects. No intracranial aneurysm or other vascular abnormality. IMPRESSION: MRI HEAD IMPRESSION: 1. No acute intracranial infarct or other abnormality identified. 2. Empty sella with dilatation of the optic nerve sheaths, suggesting  possible idiopathic intracranial hypertension. Appearance is stable relative to previous exams. MRA HEAD IMPRESSION: Minor intracranial atherosclerotic change for patient age. No large vessel occlusion. No hemodynamically significant or correctable stenosis. Electronically Signed   By: Jeannine Boga M.D.   On: 10/05/2018 01:49   US Carotid Bilateral (at [facility] And [facility] Only)  Result Date: 10/05/2018 CLINICAL DATA:  Aphasia EXAM: BILATERAL CAROTID DUPLEX ULTRASOUND TECHNIQUE: Pearline Cables scale imaging, color Doppler and duplex ultrasound were performed of bilateral carotid and vertebral arteries in the neck. COMPARISON:  None. FINDINGS: Criteria: Quantification of carotid stenosis is based on velocity parameters that correlate the residual internal carotid diameter with NASCET-based stenosis levels, using the diameter of the distal internal carotid lumen as the denominator for stenosis measurement. The following velocity measurements were obtained: RIGHT ICA: 74 cm/sec CCA: 629 cm/sec SYSTOLIC ICA/CCA RATIO:  0.6 ECA: 109 cm/sec LEFT ICA: 64 cm/sec CCA: 476 cm/sec SYSTOLIC ICA/CCA RATIO:  0.6 ECA: 122 cm/sec RIGHT CAROTID ARTERY: Little if any plaque in the bulb. Low resistance internal carotid Doppler pattern. RIGHT VERTEBRAL ARTERY:  Antegrade. LEFT CAROTID ARTERY: Minimal calcified plaque in the bulb. Low resistance internal carotid Doppler pattern. LEFT VERTEBRAL ARTERY:  Antegrade. IMPRESSION: Less than 50% stenosis in the right and left internal carotid arteries. Electronically Signed   By: Marybelle Killings M.D.   On: 10/05/2018 09:32   ASSESSMENT AND PLAN:  Danyon Mcginness  is a 52 y.o. male with a known history of homocystinuria and stroke at age 89 presents with blurred vision going on for over the last week.  Also having worsening slurred speech and trouble putting his words together.  1.  Aphasia and blurred vision.  Patient states this is similar to the stroke symptoms that he has had in the past.  With his  homocystinuria he could develop a venous thrombosis. - MRI of the brain and an MRA of the brain negative -  Echocardiogram with bubble study pending - carotid ultrasound no stenosis -  PT, OT and neurology consultations. -  Continue aspirin full dose at this point and  increase Lipitor to 40 mg daily and check a lipid profile.   2.  Homocystinuria.  Continue the patient's NAC, B12, Cystadane and B6 and folic acid supplementation -he has a neurologist who he follows for last 24 years at [facility]. Patient asked to keep that appointment.  3.  Insomnia and severe depression continue his psychiatric medications  4.  History of hypertension.  Resume all antihypertensives at this time.   5.  Progressive poly-neuropathy on both gabapentin and Lyrica.  6.  Hyperlipidemia unspecified check a lipid profile in the morning increase Lipitor to 40 mg  7.  Obesity and sleep apnea  8.  History of IBS  9.  History of avascular necrosis  Case discussed with Care Management/Social Worker. Management plans discussed with the patient, family and they are in agreement.  CODE STATUS: full DVT Prophylaxis: lovenox  TOTAL TIME TAKING CARE OF THIS PATIENT: *30* minutes.  >50% time spent on counselling and coordination of care  POSSIBLE D/C IN 1-2 DAYS, DEPENDING ON CLINICAL CONDITION.  Note: This dictation was prepared with Dragon dictation along with smaller phrase technology. Any transcriptional errors that result from this process are unintentional.  Fritzi Mandes M.D on 10/05/2018 at 3:24 PM  Between 7am to 6pm - Pager - [phone]  After 6pm go to [email or web address] - password [email or web address] [facility]  Office  [phone]  CC: Primary care physician; Ronnell Freshwater, NPPatient ID: Jesus Crosby., male   DOB: 1966-11-26, 52 y.o.   MRN: [id number]

## 2018-10-05 NOTE — Progress Notes (Signed)
Assigned Nurse, Patrice Paradise., and attending physcian rounded with pt. Pt discussed home medications and  How medication are taken at home. With conclusion of discussion MD provided verbal order to resume  Medications as taken at home.

## 2018-10-05 NOTE — Progress Notes (Signed)
Patient declined CPAP. Patient advised to have RT paged if he decided he wanted to go on his CPAP during the night, currently resting comfortably on 2L .

## 2018-10-05 NOTE — Progress Notes (Signed)
eeg completed ° °

## 2018-10-05 NOTE — Consult Note (Addendum)
Referring Physician: Loletha Grayer, MD    Chief Complaint: slurred speech, word finding difficulty, headache and blurred vision.  HPI: Jesus Crosby. is an 52 y.o. male with pertinent history as below presenting to the ED on 10/04/2018 with chief complaints of slurred speech, word finding difficulty, headache and blurred vision for the past 1 week. Patient report that he presented with similar episode when he was diagnosed with stroke 22 years ago. Denies associated symptoms of dizziness, nausea or vomiting, focal motor or sensory deficit, cranial nerve deficit or other complaints. He has hx of chronic pain being followed by [facility] pain management. Initial NIH stroke scale 2. Initial work up in the ED with CT head did not show acute intracranial abnormality. Follow up MRI/MRA brain show no acute intracranial infarct or other abnormality. Empty sella with dilation of the optic nerve sheath concerning for idiopathic intracranial hypertension is noted.  Date last known well: Unable to determine Time last known well: Unable to determine tPA Given: No: NIH stroke scale 2. Unable to determine date and time last known well.  Past Medical History:  Diagnosis Date  . Anxiety   . Arthritis    knees,Right wrist  . Avascular necrosis ([place])   . Brain bleed ([place])   . Cataract    bilateral repair with lens implants  . Depression   . Family history of adverse reaction to anesthesia    n/v-mom  . GERD (gastroesophageal reflux disease)   . 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 Hypertension   . Idiopathic progressive polyneuropathy   . Lens disease   . Neuromuscular disorder ([place])   . Obesity, Class II, BMI 35-39.9   . OCD (obsessive compulsive disorder)   . Paresthesia 06/10/2015  . Sleep apnea   . Stroke [facility])    with brain bleed at age 14-no deficits  . Umbilical hernia     Past Surgical History:  Procedure Laterality Date  . COLONOSCOPY WITH PROPOFOL N/A 07/30/2015   Procedure: COLONOSCOPY WITH PROPOFOL;   Surgeon: [name], MD;  Location: [facility];  Service: Endoscopy;  Laterality: N/A;  . DERMABRASION OF FACE     due to acne scars  . distal radius fracture of right hand (plates, screws, and pins)  2011  . ESOPHAGOGASTRODUODENOSCOPY (EGD) WITH PROPOFOL N/A 07/30/2015   Procedure: ESOPHAGOGASTRODUODENOSCOPY (EGD) WITH PROPOFOL;  Surgeon: [name], MD;  Location: [facility];  Service: Endoscopy;  Laterality: N/A;  . EYE SURGERY     LASER + SURG BIL   . GAS/FLUID EXCHANGE Right 09/17/2013   Procedure: GAS/FLUID EXCHANGE;  Surgeon: Hayden Pedro, MD;  Location: [place];  Service: Ophthalmology;  Laterality: Right;  . gastric sleeve surgery  08/17/2016  . HIP ARTHROPLASTY Left   . INTRAOCULAR LENS INSERTION Bilateral    lens disease due to homocysteinuria  . NASAL SEPTUM SURGERY  march 2016   @ [facility]  . PARS PLANA VITRECTOMY Right 09/17/2013   Procedure: PARS PLANA VITRECTOMY WITH 25G REMOVAL/SUTURE SECONDARY INTRAOCULAR LENS;  Surgeon: Hayden Pedro, MD;  Location: [place];  Service: Ophthalmology;  Laterality: Right;  . PHOTOCOAGULATION Right 09/17/2013   Procedure: PHOTOCOAGULATION;  Surgeon: Hayden Pedro, MD;  Location: [place];  Service: Ophthalmology;  Laterality: Right;  HEADSCOPE LASER  . scapholunate ligament reconstruction of right hand  2011  . UMBILICAL HERNIA REPAIR N/A 05/07/2015   Procedure: HERNIA REPAIR UMBILICAL ADULT;  Surgeon: Florene Glen, MD;  Location: [facility];  Service: General;  Laterality:  N/A;  . WRIST SURGERY Right     Family History  Problem Relation Age of Onset  . Breast cancer Mother   . Hypertension Mother   . Hypertension Father   . Stroke Maternal Grandmother   . Stroke Maternal Grandfather   . Stroke Paternal Grandmother   . Stroke Paternal Grandfather   . Stroke Other    Social History:  reports that he has never smoked. He has never used smokeless tobacco. He reports that he does not drink alcohol or use drugs.  Allergies:   Allergies  Allergen Reactions  . Pineapple Flavor Swelling    Lip swell  . Pineapple Swelling    Lips swelling  . Amoxicillin Nausea And Vomiting    REACTION: Nausea and vomiting  . Penicillins     Medications:  I have reviewed the patient's current medications. Prior to Admission:  Medications Prior to Admission  Medication Sig Dispense Refill Last Dose  . Acetylcysteine (NAC) 600 MG CAPS Take 1,800 mg by mouth at bedtime.    10/03/2018 at 2000  . ALPRAZolam (XANAX) 0.5 MG tablet Take 0.5 mg by mouth 3 (three) times daily.   10/03/2018 at Unknown time  . amLODipine (NORVASC) 5 MG tablet TAKE 1 TABLET(5 MG) BY MOUTH TWICE DAILY (Patient taking differently: Take 5 mg by mouth 2 (two) times daily. ) 180 tablet 0 10/03/2018 at 2000  . aspirin 81 MG chewable tablet Chew 81 mg by mouth daily.    10/03/2018 at Unknown time  . atorvastatin (LIPITOR) 10 MG tablet Take 1 tablet (10 mg total) by mouth daily. 90 tablet 3 10/03/2018 at Unknown time  . Betaine (CYSTADANE) POWD Take 8 Scoops by mouth 3 (three) times daily.   10/03/2018 at 1800  . Cyanocobalamin (VITAMIN B 12 PO) Take 1,000 mcg by mouth at bedtime.    10/03/2018 at 2000  . folic acid (FOLVITE) 024 MCG tablet Take 1,200 mcg by mouth at bedtime.    10/03/2018 at 2000  . furosemide (LASIX) 20 MG tablet Take 1 tablet (20 mg total) by mouth every morning. 30 tablet 3 10/03/2018 at 0800  . gabapentin (NEURONTIN) 600 MG tablet Take 1,200 mg by mouth 2 (two) times daily.    10/03/2018 at 2000  . losartan (COZAAR) 50 MG tablet Take 25 mg by mouth daily.   10/03/2018 at 0800  . metoprolol tartrate (LOPRESSOR) 50 MG tablet Take 1.5 tablets (75 mg total) by mouth 2 (two) times daily. 270 tablet 0 10/03/2018 at 2000  . NONFORMULARY OR COMPOUNDED ITEM See pharmacy note 120 each 2 As directed at As directed  . OLANZapine (ZYPREXA) 10 MG tablet Take 10 mg by mouth at bedtime.   10/03/2018 at 2000  . omeprazole (PRILOSEC) 20 MG capsule Take 20 mg by  mouth daily.   10/03/2018 at 0800  . pyridOXINE (VITAMIN B-6) 100 MG tablet Take 500 mg by mouth at bedtime.    10/03/2018 at 2000  . sertraline (ZOLOFT) 100 MG tablet Take 200 mg by mouth daily.    10/03/2018 at 0800  . traZODone (DESYREL) 100 MG tablet Take 200 mg by mouth at bedtime.   10/03/2018 at Unknown time  . dicyclomine (BENTYL) 10 MG capsule Take 1 capsule (10 mg total) by mouth every 6 (six) hours as needed (abdominal pain, cramping or diarrhea). (Patient not taking: Reported on 10/04/2018) 90 capsule 0 Not Taking at Unknown time  . losartan (COZAAR) 50 MG tablet Take half tab po qd (Patient not taking:  Reported on 10/04/2018) 45 tablet 3 Not Taking at Unknown time   Scheduled: Marland Kitchen Acetylcysteine  1,800 mg Oral QHS  . ALPRAZolam  0.5 mg Oral TID  . aspirin EC  325 mg Oral Daily  . atorvastatin  40 mg Oral q1800  . CYSTADANE  8 Scoop Oral TID  . dicyclomine  10 mg Oral Q6H  . enoxaparin (LOVENOX) injection  40 mg Subcutaneous Q24H  . folic acid  1,572 mcg Oral QHS  . gabapentin  1,200 mg Oral BID  . OLANZapine  10 mg Oral QHS  . pregabalin  50 mg Oral BID  . pyridOXINE  500 mg Oral QHS  . [START ON 10/06/2018] sertraline  200 mg Oral Daily  . traZODone  200 mg Oral QHS  . vitamin B-12  1,000 mcg Oral QHS    ROS: History obtained from the patient   General ROS: negative for - chills, fatigue, fever, night sweats, weight gain or weight loss Psychological ROS: negative for - behavioral disorder, hallucinations, memory difficulties, mood swings or suicidal ideation Ophthalmic ROS: negative for - blurry vision, double vision, eye pain or loss of vision ENT ROS: negative for - epistaxis, nasal discharge, oral lesions, sore throat, tinnitus or vertigo Allergy and Immunology ROS: negative for - hives or itchy/watery eyes Hematological and Lymphatic ROS: negative for - bleeding problems, bruising or swollen lymph nodes Endocrine ROS: negative for - galactorrhea, hair pattern changes,  polydipsia/polyuria or temperature intolerance Respiratory ROS: negative for - cough, hemoptysis, shortness of breath or wheezing Cardiovascular ROS: negative for - chest pain, dyspnea on exertion, edema or irregular heartbeat Gastrointestinal ROS: negative for - abdominal pain, diarrhea, hematemesis, nausea/vomiting or stool incontinence Genito-Urinary ROS: negative for - dysuria, hematuria, incontinence or urinary frequency/urgency Musculoskeletal ROS: negative for - joint swelling or muscular weakness Neurological ROS: as noted in HPI Dermatological ROS: negative for rash and skin lesion changes  Physical Examination: Blood pressure (!) 144/81, pulse (!) 101, temperature 97.9 F (36.6 C), temperature source Oral, resp. rate 20, height 6\' 3"  (1.905 m), weight 127.9 kg, SpO2 96 %.   HEENT-  Normocephalic, no lesions, without obvious abnormality.  Normal external eye and conjunctiva.  Normal TM's bilaterally.  Normal auditory canals and external ears. Normal external nose, mucus membranes and septum.  Normal pharynx. Cardiovascular- S1, S2 normal, pulses palpable throughout   Lungs- chest clear, no wheezing, rales, normal symmetric air entry Abdomen- soft, non-tender; bowel sounds normal; no masses,  no organomegaly Extremities- no edema Lymph-no adenopathy palpable Musculoskeletal-no joint tenderness, deformity or swelling Skin-warm and dry, no hyperpigmentation, vitiligo, or suspicious lesions  Neurological Exam    Mental Status: Alert, oriented, thought content appropriate.  Speech fluent without evidence of aphasia.  Patient initially with some stuttering but as he became more involved in the conversation all stuttering resolved.   Able to follow 3 step commands without difficulty. Attention span and concentration seemed appropriate  Cranial Nerves: II: Discs flat bilaterally; Visual fields grossly normal, pupils equal, round, reactive to light and accommodation III,IV, VI: ptosis not  present, extra-ocular motions intact bilaterally V,VII: smile symmetric, facial light touch sensation intact VIII: hearing normal bilaterally IX,X: gag reflex present XI: bilateral shoulder shrug XII: midline tongue extension Motor: Right :  Upper extremity   5/5 Without pronator drift      Left: Upper extremity   5/5 without pronator drift Patient unable to lift either lower extremity off the bed Sensory: Pinprick and light touch decreased in the lower extremities Deep  Tendon Reflexes: 2+ in the upper extremities and absent in the lower extremities Plantars: Right: mute                              Left: mute Cerebellar: Finger-to-nose testing intact bilaterally. Gait: not tested due to safety concerns  Data Reviewed  Laboratory Studies:  Basic Metabolic Panel: Recent Labs  Lab 10/04/18 1618 10/05/18 0506  NA 133* 137  K 4.4 4.1  CL 92* 99  CO2 29 29  GLUCOSE 83 87  BUN 10 10  CREATININE 1.10 0.99  CALCIUM 9.5 8.9    Liver Function Tests: Recent Labs  Lab 10/04/18 1618  AST 57*  ALT 56*  ALKPHOS 54  BILITOT 0.9  PROT 9.0*  ALBUMIN 4.7   No results for input(s): LIPASE, AMYLASE in the last 168 hours. No results for input(s): AMMONIA in the last 168 hours.  CBC: Recent Labs  Lab 10/04/18 1618 10/05/18 0506  WBC 7.5 5.3  NEUTROABS 3.8  --   HGB 14.4 12.6*  HCT 42.8 37.8*  MCV 89.4 89.8  PLT 210 174    Cardiac Enzymes: Recent Labs  Lab 10/04/18 1618  TROPONINI <0.03    BNP: Invalid input(s): POCBNP  CBG: Recent Labs  Lab 10/04/18 [place] 80    Microbiology: No results found for this or any previous visit.  Coagulation Studies: Recent Labs    10/04/18 1649  LABPROT 14.4  INR 1.13    Urinalysis: No results for input(s): COLORURINE, LABSPEC, PHURINE, GLUCOSEU, HGBUR, BILIRUBINUR, KETONESUR, PROTEINUR, UROBILINOGEN, NITRITE, LEUKOCYTESUR in the last 168 hours.  Invalid input(s): APPERANCEUR  Lipid Panel:    Component Value  Date/Time   CHOL 211 (H) 10/05/2018 0506   CHOL 198 08/29/2018 1121   TRIG 409 (H) 10/05/2018 0506   HDL 28 (L) 10/05/2018 0506   HDL 34 (L) 08/29/2018 1121   CHOLHDL 7.5 10/05/2018 0506   VLDL UNABLE TO CALCULATE IF TRIGLYCERIDE OVER 400 mg/dL 10/05/2018 0506   LDLCALC UNABLE TO CALCULATE IF TRIGLYCERIDE OVER 400 mg/dL 10/05/2018 0506   LDLCALC 133 (H) 08/29/2018 1121    HgbA1C:  Lab Results  Component Value Date   HGBA1C 5.1 10/04/2018    Urine Drug Screen:      Component Value Date/Time   LABOPIA NONE DETECTED 04/30/2016 0001   LABOPIA NONE DETECTED 08/15/2014 0230   COCAINSCRNUR NONE DETECTED 04/30/2016 0001   LABBENZ NONE DETECTED 04/30/2016 0001   LABBENZ NONE DETECTED 08/15/2014 0230   AMPHETMU NONE DETECTED 04/30/2016 0001   AMPHETMU NONE DETECTED 08/15/2014 0230   THCU NONE DETECTED 04/30/2016 0001   THCU NONE DETECTED 08/15/2014 0230   LABBARB NONE DETECTED 04/30/2016 0001   LABBARB NONE DETECTED 08/15/2014 0230    Alcohol Level: No results for input(s): ETH in the last 168 hours.  Other results: EKG: normal EKG, normal sinus rhythm, unchanged from previous tracings. Vent. rate 80 BPM PR interval 168 ms QRS duration 84 ms QT/QTc 378/435 ms P-R-T axes 17 9 13   Imaging: Ct Head Wo Contrast  Result Date: 10/04/2018 CLINICAL DATA:  Slurred speech and unsteady gait. Headache and blurred vision for a week. EXAM: CT HEAD WITHOUT CONTRAST TECHNIQUE: Contiguous axial images were obtained from the base of the skull through the vertex without intravenous contrast. COMPARISON:  Apr 29, 2016 FINDINGS: Brain: No subdural, epidural, or subarachnoid hemorrhage. No mass effect or midline shift. Ventricles and sulci are normal. Cerebellum, brainstem, and  basal cisterns are within normal limits. No acute cortical ischemia or infarct identified. Vascular: No hyperdense vessel or unexpected calcification. Skull: Normal. Negative for fracture or focal lesion. Sinuses/Orbits: No  acute finding. Other: None. IMPRESSION: No acute intracranial abnormalities. Electronically Signed   By: Dorise Bullion III M.D   On: 10/04/2018 17:05   Mr Jodene Nam Head Wo Contrast  Result Date: 10/05/2018 CLINICAL DATA:  Initial evaluation for approximate 1 week history of blurry vision, slurred speech, word difficulty. History of prior stroke. EXAM: MRI HEAD WITHOUT CONTRAST MRA HEAD WITHOUT CONTRAST TECHNIQUE: Multiplanar, multiecho pulse sequences of the brain and surrounding structures were obtained without intravenous contrast. Angiographic images of the head were obtained using MRA technique without contrast. COMPARISON:  Prior CT from earlier the same day as well as previous MRI from 06/03/2015. FINDINGS: MRI HEAD FINDINGS Brain: Cerebral volume within normal limits for age. No significant cerebral white matter changes. No abnormal foci of restricted diffusion to suggest or subacute ischemia. Gray-white matter differentiation maintained. No areas of remote cortical infarction. No acute or chronic intracranial hemorrhage. No mass lesion, midline shift or mass effect. No hydrocephalus. No extra-axial fluid collection. Incidental note made of an empty sella, similar to previous. Vascular: Major intracranial vascular flow voids maintained. Skull and upper cervical spine: Craniocervical junction normal. Upper cervical spine within normal limits. Bone marrow signal intensity normal. No scalp soft tissue abnormality. Sinuses/Orbits: Dilatation of the optic nerve sheaths noted bilaterally, similar to previous. Patient status post ocular lens replacement bilaterally. Paranasal sinuses are largely clear. No mastoid effusion. Inner ear structures grossly normal. Other: None. MRA HEAD FINDINGS ANTERIOR CIRCULATION: Distal cervical segments of the internal carotid arteries widely patent with antegrade flow. Petrous, cavernous, and supraclinoid segments widely patent without hemodynamically significant stenosis. Origin  of the ophthalmic arteries patent bilaterally. ICA termini symmetric and well opacified. A1 segments patent bilaterally. Normal anterior communicating artery. Anterior cerebral arteries widely patent to their distal aspects. Short-segment mild stenoses at the proximal right M1 and distal left M1 segments. Normal MCA bifurcations. Distal MCA branches well perfused and symmetric. POSTERIOR CIRCULATION: Vertebral arteries patent to the vertebrobasilar junction without stenosis. Left vertebral artery dominant. Posterior inferior cerebral arteries patent bilaterally. Basilar patent to its distal aspect without stenosis superior cerebral arteries patent bilaterally. Hypoplastic P1 segments with prominent bilateral posterior communicating arteries. PCAs well perfused to their distal aspects. No intracranial aneurysm or other vascular abnormality. IMPRESSION: MRI HEAD IMPRESSION: 1. No acute intracranial infarct or other abnormality identified. 2. Empty sella with dilatation of the optic nerve sheaths, suggesting possible idiopathic intracranial hypertension. Appearance is stable relative to previous exams. MRA HEAD IMPRESSION: Minor intracranial atherosclerotic change for patient age. No large vessel occlusion. No hemodynamically significant or correctable stenosis. Electronically Signed   By: Jeannine Boga M.D.   On: 10/05/2018 01:49   Mr Brain Wo Contrast  Result Date: 10/05/2018 CLINICAL DATA:  Initial evaluation for approximate 1 week history of blurry vision, slurred speech, word difficulty. History of prior stroke. EXAM: MRI HEAD WITHOUT CONTRAST MRA HEAD WITHOUT CONTRAST TECHNIQUE: Multiplanar, multiecho pulse sequences of the brain and surrounding structures were obtained without intravenous contrast. Angiographic images of the head were obtained using MRA technique without contrast. COMPARISON:  Prior CT from earlier the same day as well as previous MRI from 06/03/2015. FINDINGS: MRI HEAD FINDINGS  Brain: Cerebral volume within normal limits for age. No significant cerebral white matter changes. No abnormal foci of restricted diffusion to suggest or subacute ischemia. Gray-white matter differentiation  maintained. No areas of remote cortical infarction. No acute or chronic intracranial hemorrhage. No mass lesion, midline shift or mass effect. No hydrocephalus. No extra-axial fluid collection. Incidental note made of an empty sella, similar to previous. Vascular: Major intracranial vascular flow voids maintained. Skull and upper cervical spine: Craniocervical junction normal. Upper cervical spine within normal limits. Bone marrow signal intensity normal. No scalp soft tissue abnormality. Sinuses/Orbits: Dilatation of the optic nerve sheaths noted bilaterally, similar to previous. Patient status post ocular lens replacement bilaterally. Paranasal sinuses are largely clear. No mastoid effusion. Inner ear structures grossly normal. Other: None. MRA HEAD FINDINGS ANTERIOR CIRCULATION: Distal cervical segments of the internal carotid arteries widely patent with antegrade flow. Petrous, cavernous, and supraclinoid segments widely patent without hemodynamically significant stenosis. Origin of the ophthalmic arteries patent bilaterally. ICA termini symmetric and well opacified. A1 segments patent bilaterally. Normal anterior communicating artery. Anterior cerebral arteries widely patent to their distal aspects. Short-segment mild stenoses at the proximal right M1 and distal left M1 segments. Normal MCA bifurcations. Distal MCA branches well perfused and symmetric. POSTERIOR CIRCULATION: Vertebral arteries patent to the vertebrobasilar junction without stenosis. Left vertebral artery dominant. Posterior inferior cerebral arteries patent bilaterally. Basilar patent to its distal aspect without stenosis superior cerebral arteries patent bilaterally. Hypoplastic P1 segments with prominent bilateral posterior communicating  arteries. PCAs well perfused to their distal aspects. No intracranial aneurysm or other vascular abnormality. IMPRESSION: MRI HEAD IMPRESSION: 1. No acute intracranial infarct or other abnormality identified. 2. Empty sella with dilatation of the optic nerve sheaths, suggesting possible idiopathic intracranial hypertension. Appearance is stable relative to previous exams. MRA HEAD IMPRESSION: Minor intracranial atherosclerotic change for patient age. No large vessel occlusion. No hemodynamically significant or correctable stenosis. Electronically Signed   By: Jeannine Boga M.D.   On: 10/05/2018 01:49   US Carotid Bilateral (at [facility] Only)  Result Date: 10/05/2018 CLINICAL DATA:  Aphasia EXAM: BILATERAL CAROTID DUPLEX ULTRASOUND TECHNIQUE: Pearline Cables scale imaging, color Doppler and duplex ultrasound were performed of bilateral carotid and vertebral arteries in the neck. COMPARISON:  None. FINDINGS: Criteria: Quantification of carotid stenosis is based on velocity parameters that correlate the residual internal carotid diameter with NASCET-based stenosis levels, using the diameter of the distal internal carotid lumen as the denominator for stenosis measurement. The following velocity measurements were obtained: RIGHT ICA: 74 cm/sec CCA: 102 cm/sec SYSTOLIC ICA/CCA RATIO:  0.6 ECA: 109 cm/sec LEFT ICA: 64 cm/sec CCA: 585 cm/sec SYSTOLIC ICA/CCA RATIO:  0.6 ECA: 122 cm/sec RIGHT CAROTID ARTERY: Little if any plaque in the bulb. Low resistance internal carotid Doppler pattern. RIGHT VERTEBRAL ARTERY:  Antegrade. LEFT CAROTID ARTERY: Minimal calcified plaque in the bulb. Low resistance internal carotid Doppler pattern. LEFT VERTEBRAL ARTERY:  Antegrade. IMPRESSION: Less than 50% stenosis in the right and left internal carotid arteries. Electronically Signed   By: Marybelle Killings M.D.   On: 10/05/2018 09:32    This patient was staffed with Dr. Magda Paganini, Doy Mince who personally evaluated patient, reviewed  documentation and wrote assessment and plan of care as below.  Rufina Falco, DNP, FNP-BC Board certified Nurse Practitioner Neurology Department  Patient seen and examined.  Clinical course and management discussed.  Necessary edits performed.  I agree with the above.  Assessment and plan of care developed and discussed below.    Assessment: 52 y.o. male presenting with speech difficulties and blurred vision.  Reports that his speech continues to be an issue today.  MRI of the brain reviewed and shows  no acute changes.  After further conversation with the patient it appears that he has had multiple medication changes over the past month and I suspect much of that is what is causing his current presentation.    Stroke Risk Factors - hypertension and sleep apnea  Plan: 1. Continue ASA and statin 2. Restart Lyrica at 50mg  BID 3. PT consult 4. EEG  10/05/2018, 12:46 PM  Alexis Goodell, MD Neurology [phone]  10/05/2018  1:51 PM

## 2018-10-05 NOTE — Progress Notes (Addendum)
Patient ID: Jesus Crosby., male   DOB: 04/15/1966, 52 y.o.   MRN: [id number]   [facility] PROGRESS NOTE  Jesus Crosby Jesus Crosby. [id number] DOB: Dec 02, 1966 DOA: 10/04/2018 PCP: Ronnell Freshwater, NP  HPI/Subjective: Called urgently to see the patient because of shaking uncontrollably of his lower extremities and heart rate going up to 180.  The patient was talking and just could not slow his movements down of his lower extremities.  He was advised to take slow deep breaths.  He was given IV Ativan and this seemed to have helped.  I ended up giving another dose of IV Ativan and things settle down.  Objective: Vitals:   10/05/18 1933 10/05/18 1944  BP: (!) 186/98 (!) 132/92  Pulse: (!) 176 (!) 124  Resp:    Temp: 98.4 F (36.9 C)   SpO2: 98% 96%    Intake/Output Summary (Last 24 hours) at 10/05/2018 2013 Last data filed at 10/05/2018 1018 Gross per 24 hour  Intake 727.25 ml  Output -  Net 727.25 ml   Filed Weights   10/04/18 1612 10/04/18 2011  Weight: 127.9 kg 127.9 kg    ROS: Review of Systems  Respiratory: Positive for shortness of breath.   Cardiovascular: Positive for chest pain.  Musculoskeletal: Positive for joint pain and myalgias.   Exam: Physical Exam  Constitutional: He is oriented to person, place, and time.  HENT:  Nose: No mucosal edema.  Mouth/Throat: No oropharyngeal exudate or posterior oropharyngeal edema.  Eyes: Pupils are equal, round, and reactive to light. Conjunctivae, EOM and lids are normal.  Neck: No JVD present. Carotid bruit is not present. No edema present. No thyroid mass and no thyromegaly present.  Cardiovascular: S1 normal and S2 normal. Tachycardia present. Exam reveals no gallop.  No murmur heard. Pulses:      Dorsalis pedis pulses are 2+ on the right side, and 2+ on the left side.  Respiratory: No respiratory distress. He has no wheezes. He has no rhonchi. He has no rales.  GI: Soft. Bowel sounds are normal. There is no  tenderness.  Musculoskeletal:       Right ankle: He exhibits swelling.       Left ankle: He exhibits swelling.  Lymphadenopathy:    He has no cervical adenopathy.  Neurological: He is alert and oriented to person, place, and time. No cranial nerve deficit.  Patient with uncontrollable movements of the lower extremities.  Initially was very stiff but after IV Ativan things seem to have gotten better.  Then I was able to move his legs and muscles without a problem.  Skin: Skin is warm. No rash noted. Nails show no clubbing.  Psychiatric: He has a normal mood and affect.      Data Reviewed: Basic Metabolic Panel: Recent Labs  Lab 10/04/18 1618 10/05/18 0506  NA 133* 137  K 4.4 4.1  CL 92* 99  CO2 29 29  GLUCOSE 83 87  BUN 10 10  CREATININE 1.10 0.99  CALCIUM 9.5 8.9   Liver Function Tests: Recent Labs  Lab 10/04/18 1618  AST 57*  ALT 56*  ALKPHOS 54  BILITOT 0.9  PROT 9.0*  ALBUMIN 4.7   CBC: Recent Labs  Lab 10/04/18 1618 10/05/18 0506  WBC 7.5 5.3  NEUTROABS 3.8  --   HGB 14.4 12.6*  HCT 42.8 37.8*  MCV 89.4 89.8  PLT 210 174   Cardiac Enzymes: Recent Labs  Lab 10/04/18 1618  TROPONINI <0.03  CBG: Recent Labs  Lab 10/04/18 1614  GLUCAP 80     Studies: Ct Head Wo Contrast  Result Date: 10/04/2018 CLINICAL DATA:  Slurred speech and unsteady gait. Headache and blurred vision for a week. EXAM: CT HEAD WITHOUT CONTRAST TECHNIQUE: Contiguous axial images were obtained from the base of the skull through the vertex without intravenous contrast. COMPARISON:  Apr 29, 2016 FINDINGS: Brain: No subdural, epidural, or subarachnoid hemorrhage. No mass effect or midline shift. Ventricles and sulci are normal. Cerebellum, brainstem, and basal cisterns are within normal limits. No acute cortical ischemia or infarct identified. Vascular: No hyperdense vessel or unexpected calcification. Skull: Normal. Negative for fracture or focal lesion. Sinuses/Orbits: No acute  finding. Other: None. IMPRESSION: No acute intracranial abnormalities. Electronically Signed   By: Dorise Bullion III M.D   On: 10/04/2018 17:05   Mr Jesus Crosby Head Wo Contrast  Result Date: 10/05/2018 CLINICAL DATA:  Initial evaluation for approximate 1 week history of blurry vision, slurred speech, word difficulty. History of prior stroke. EXAM: MRI HEAD WITHOUT CONTRAST MRA HEAD WITHOUT CONTRAST TECHNIQUE: Multiplanar, multiecho pulse sequences of the brain and surrounding structures were obtained without intravenous contrast. Angiographic images of the head were obtained using MRA technique without contrast. COMPARISON:  Prior CT from earlier the same day as well as previous MRI from 06/03/2015. FINDINGS: MRI HEAD FINDINGS Brain: Cerebral volume within normal limits for age. No significant cerebral white matter changes. No abnormal foci of restricted diffusion to suggest or subacute ischemia. Gray-white matter differentiation maintained. No areas of remote cortical infarction. No acute or chronic intracranial hemorrhage. No mass lesion, midline shift or mass effect. No hydrocephalus. No extra-axial fluid collection. Incidental note made of an empty sella, similar to previous. Vascular: Major intracranial vascular flow voids maintained. Skull and upper cervical spine: Craniocervical junction normal. Upper cervical spine within normal limits. Bone marrow signal intensity normal. No scalp soft tissue abnormality. Sinuses/Orbits: Dilatation of the optic nerve sheaths noted bilaterally, similar to previous. Patient status post ocular lens replacement bilaterally. Paranasal sinuses are largely clear. No mastoid effusion. Inner ear structures grossly normal. Other: None. MRA HEAD FINDINGS ANTERIOR CIRCULATION: Distal cervical segments of the internal carotid arteries widely patent with antegrade flow. Petrous, cavernous, and supraclinoid segments widely patent without hemodynamically significant stenosis. Origin of the  ophthalmic arteries patent bilaterally. ICA termini symmetric and well opacified. A1 segments patent bilaterally. Normal anterior communicating artery. Anterior cerebral arteries widely patent to their distal aspects. Short-segment mild stenoses at the proximal right M1 and distal left M1 segments. Normal MCA bifurcations. Distal MCA branches well perfused and symmetric. POSTERIOR CIRCULATION: Vertebral arteries patent to the vertebrobasilar junction without stenosis. Left vertebral artery dominant. Posterior inferior cerebral arteries patent bilaterally. Basilar patent to its distal aspect without stenosis superior cerebral arteries patent bilaterally. Hypoplastic P1 segments with prominent bilateral posterior communicating arteries. PCAs well perfused to their distal aspects. No intracranial aneurysm or other vascular abnormality. IMPRESSION: MRI HEAD IMPRESSION: 1. No acute intracranial infarct or other abnormality identified. 2. Empty sella with dilatation of the optic nerve sheaths, suggesting possible idiopathic intracranial hypertension. Appearance is stable relative to previous exams. MRA HEAD IMPRESSION: Minor intracranial atherosclerotic change for patient age. No large vessel occlusion. No hemodynamically significant or correctable stenosis. Electronically Signed   By: Jeannine Boga M.D.   On: 10/05/2018 01:49   Mr Brain Wo Contrast  Result Date: 10/05/2018 CLINICAL DATA:  Initial evaluation for approximate 1 week history of blurry vision, slurred speech, word difficulty. History of  prior stroke. EXAM: MRI HEAD WITHOUT CONTRAST MRA HEAD WITHOUT CONTRAST TECHNIQUE: Multiplanar, multiecho pulse sequences of the brain and surrounding structures were obtained without intravenous contrast. Angiographic images of the head were obtained using MRA technique without contrast. COMPARISON:  Prior CT from earlier the same day as well as previous MRI from 06/03/2015. FINDINGS: MRI HEAD FINDINGS Brain:  Cerebral volume within normal limits for age. No significant cerebral white matter changes. No abnormal foci of restricted diffusion to suggest or subacute ischemia. Gray-white matter differentiation maintained. No areas of remote cortical infarction. No acute or chronic intracranial hemorrhage. No mass lesion, midline shift or mass effect. No hydrocephalus. No extra-axial fluid collection. Incidental note made of an empty sella, similar to previous. Vascular: Major intracranial vascular flow voids maintained. Skull and upper cervical spine: Craniocervical junction normal. Upper cervical spine within normal limits. Bone marrow signal intensity normal. No scalp soft tissue abnormality. Sinuses/Orbits: Dilatation of the optic nerve sheaths noted bilaterally, similar to previous. Patient status post ocular lens replacement bilaterally. Paranasal sinuses are largely clear. No mastoid effusion. Inner ear structures grossly normal. Other: None. MRA HEAD FINDINGS ANTERIOR CIRCULATION: Distal cervical segments of the internal carotid arteries widely patent with antegrade flow. Petrous, cavernous, and supraclinoid segments widely patent without hemodynamically significant stenosis. Origin of the ophthalmic arteries patent bilaterally. ICA termini symmetric and well opacified. A1 segments patent bilaterally. Normal anterior communicating artery. Anterior cerebral arteries widely patent to their distal aspects. Short-segment mild stenoses at the proximal right M1 and distal left M1 segments. Normal MCA bifurcations. Distal MCA branches well perfused and symmetric. POSTERIOR CIRCULATION: Vertebral arteries patent to the vertebrobasilar junction without stenosis. Left vertebral artery dominant. Posterior inferior cerebral arteries patent bilaterally. Basilar patent to its distal aspect without stenosis superior cerebral arteries patent bilaterally. Hypoplastic P1 segments with prominent bilateral posterior communicating  arteries. PCAs well perfused to their distal aspects. No intracranial aneurysm or other vascular abnormality. IMPRESSION: MRI HEAD IMPRESSION: 1. No acute intracranial infarct or other abnormality identified. 2. Empty sella with dilatation of the optic nerve sheaths, suggesting possible idiopathic intracranial hypertension. Appearance is stable relative to previous exams. MRA HEAD IMPRESSION: Minor intracranial atherosclerotic change for patient age. No large vessel occlusion. No hemodynamically significant or correctable stenosis. Electronically Signed   By: Jeannine Boga M.D.   On: 10/05/2018 01:49   US Carotid Bilateral (at [facility] Only)  Result Date: 10/05/2018 CLINICAL DATA:  Aphasia EXAM: BILATERAL CAROTID DUPLEX ULTRASOUND TECHNIQUE: Pearline Cables scale imaging, color Doppler and duplex ultrasound were performed of bilateral carotid and vertebral arteries in the neck. COMPARISON:  None. FINDINGS: Criteria: Quantification of carotid stenosis is based on velocity parameters that correlate the residual internal carotid diameter with NASCET-based stenosis levels, using the diameter of the distal internal carotid lumen as the denominator for stenosis measurement. The following velocity measurements were obtained: RIGHT ICA: 74 cm/sec CCA: 503 cm/sec SYSTOLIC ICA/CCA RATIO:  0.6 ECA: 109 cm/sec LEFT ICA: 64 cm/sec CCA: 546 cm/sec SYSTOLIC ICA/CCA RATIO:  0.6 ECA: 122 cm/sec RIGHT CAROTID ARTERY: Little if any plaque in the bulb. Low resistance internal carotid Doppler pattern. RIGHT VERTEBRAL ARTERY:  Antegrade. LEFT CAROTID ARTERY: Minimal calcified plaque in the bulb. Low resistance internal carotid Doppler pattern. LEFT VERTEBRAL ARTERY:  Antegrade. IMPRESSION: Less than 50% stenosis in the right and left internal carotid arteries. Electronically Signed   By: Marybelle Killings M.D.   On: 10/05/2018 09:32    Scheduled Meds: . LORazepam      . Acetylcysteine  1,800 mg Oral QHS  . ALPRAZolam  0.5 mg Oral TID   . amLODipine  5 mg Oral BID  . aspirin EC  325 mg Oral Daily  . atorvastatin  40 mg Oral q1800  . CYSTADANE  8 Scoop Oral TID  . dicyclomine  10 mg Oral Q6H  . enoxaparin (LOVENOX) injection  40 mg Subcutaneous Q24H  . folic acid  1,610 mcg Oral QHS  . furosemide  20 mg Oral Daily  . gabapentin  1,200 mg Oral BID  . losartan  25 mg Oral Daily  . metoprolol tartrate  75 mg Oral BID  . OLANZapine  10 mg Oral QHS  . pregabalin  50 mg Oral BID  . pyridOXINE  500 mg Oral QHS  . [START ON 10/06/2018] sertraline  200 mg Oral Daily  . traZODone  200 mg Oral QHS  . vitamin B-12  1,000 mcg Oral QHS   Continuous Infusions:  Assessment/Plan:  1. Uncontrollable muscle movements of the lower extremities.  This seemed to settle down when I gave him half milligram of Ativan and repeated the dose.  I put in a PRN IV Ativan.  I will hold off on Zyprexa and Zoloft just in case this is a serotonin syndrome.  Continue to monitor closely.  This was not a seizure since the patient was talking with me during the entire time he was having these uncontrollable movements. 2. Sinus tachycardia and SVT.  Settled down with stopping the uncontrollable movements of the lower extremities.  I gave 5 mg of IV metoprolol and his oral metoprolol. 3. Aphasia and blurred vision.  Stroke work-up was negative EEG negative 4. Homocystinuria.  Continue all the patient's usual medications 5. Insomnia and depression.  I am holding Zyprexa and Zoloft just in case this is a serotonin syndrome.  Spoke with Dr. Ina Kick to see the patient in the morning and she agreed with holding Zyprexa and Zoloft this evening.  Code Status:     Code Status Orders  (From admission, onward)         Start     Ordered   10/04/18 1847  Full code  Continuous     10/04/18 1847        Code Status History    Date Active Date Inactive Code Status Order ID Comments User Context   08/15/2014 0217 08/16/2014 2011 Full Code [id number]  Rise Patience, MD Inpatient     Family Communication: Family at bedside Disposition Plan: To be determined  Consultants:  Neurology  Time spent: 40 minutes  [place]

## 2018-10-05 NOTE — Progress Notes (Addendum)
Pt called out some time between 1850 and 1900, complaining of uncontrolled movement of legs. Upon approach to pt room. Pt was yelling out "I can't stop it, I can't stop it" repeatedly. Pt was lying on his back in the bed and both left and lower extreamities (Legs) were kicking in a similar fashion as some one whom would be swimming in water. Pt was asked to hold feet up in air and was able to lift feet controllable and keep his feet lifted as assigned nurse, myself, counted from 10 to 1. Secretary paged MD. All side rails were placed up on pt bed due to the reported uncontrolled muscle movement. Suction was set up at bedside. MD paged a second time. MD returned page and prescribed 0.5 mg ativan after receiving oral report on pt over the phone. STAT EKG ordered and MD stated he was on his way to unit. Once on site MD assessed pt. Pt heart rate was trending in the 170's when oral report was given. Upon arrival MD assessed pt. Following assessment he ordered 5 ml metoprolol. IV and another dose of 0.5 mg ativan. MD stated to hold other blood pressure medications as of current.   In room:  [facility]. RN    Elzie Rings., RN  Reino Bellis., RN  Katherine Roan,  RN  Patrice Paradise., RN     Vista Mink., NT  R. Earleen Newport, RN

## 2018-10-05 NOTE — Progress Notes (Signed)
*  PRELIMINARY RESULTS* Echocardiogram 2D Echocardiogram has been performed.  Sherrie Sport 10/05/2018, 1:10 PM

## 2018-10-05 NOTE — Progress Notes (Signed)
Stroke swallow screen negative for dysphagia. Pt able to tolerate liquids and food.  Pt does not use straws.  Pt has mild slurred speech and occasional difficulty finding words.  Pt has mild asymmetrical smile to right. Dorna Bloom RN

## 2018-10-05 NOTE — Procedures (Signed)
ELECTROENCEPHALOGRAM REPORT   Patient: Jesus Crosby.       Room #: 116A-AA EEG No. ID: [id number] Age: 52 y.o.        Sex: male Referring Physician: Posey Pronto Report Date:  10/05/2018        Interpreting Physician: Alexis Goodell  History: Jesus Crosby. is an 52 y.o. male with slurred speech  Medications:  Xanax, ASA, Lipitor, Bentyl, Folvite, Neurontin, B6, Desyrel, Zyprexa, B12  Conditions of Recording:  This is a 21 channel routine scalp EEG performed with bipolar and monopolar montages arranged in accordance to the international 10/20 system of electrode placement. One channel was dedicated to EKG recording.  The patient is in the awake state.  Description:  The waking background activity is dominated by a diffusely distributed rhythmical beta activity that waxes and wanes in amplitude.  This is continuous throughout the recording and is maintained despite eye opening and closure.  No epileptiform activity is noted.   Hyperventilation was not performed. Intermittent photic stimulation was performed but failed to illicit any change in the tracing.     IMPRESSION: This electroencephalogram is characterized by diffusely distributed beta activity.  This is consistent with patient's medications.  No epileptiform activity is noted.     Alexis Goodell, MD Neurology [phone] 10/05/2018, 2:02 PM

## 2018-10-05 NOTE — Plan of Care (Signed)
  Problem: Clinical Measurements: Goal: Ability to maintain clinical measurements within normal limits will improve Outcome: Progressing   

## 2018-10-05 NOTE — Evaluation (Signed)
Occupational Therapy Evaluation Patient Details Name: Jesus Crosby. MRN: [id number] DOB: [birth year] Today's Date: 10/05/2018    History of Present Illness Pt is 52 y/o male presenting with blurred vision, HA, unsteadiness, and difficulty come up with words. Pt admitted for aphasia and blurred vision on 10/04/18. PMH includes CVA at age 30, AVN L hip and L wrist, neuromuscular disorder, idiopathic progressive polyneuropathy, HTN, paresthesia, OCD and GERD among other comorbidities. Imaging showed no acute intracranial abnormalities, possible intracranial HTN appearance is stable, minor intracranial atherosclerotic changes for pt age, no large vessel occlusion, no hemodynamically significant stenosis, and <50% stenosis in right and left ICA.   Clinical Impression   Pt seen for OT evaluation this date. Pt is a 52 y/o male with a complicated medical history who was admitted for aphasia. Prior to admission, pt was on disability. Pt lives alone in an apartment with 12 steps to enter. Pt actively searching for an apartment on the first floor. Pt has family for assistance PRN. Upon start of OT session, pt was ambulating independently, without DME from bathroom to bed. Pt has history AVN in L wrist and L hip and a history of extensive fx in R wrist which impacts their functional use. No known precautions.Pt also has history of neuropathy in bilateral feet requiring daily use of orthopedic footwear and compression socks.  Pt noted fear of falling in/out of tub/shower due to previous falls which decreased his bathing hygiene.  Currently, pt demonstrates impairments with pain, activity tolerance and UE functional use. Pt requires MOD I assist for bathing. Pt instructed in home modifications and foot neuropathy mgt. Pt verbalized understanding of all education presented at this time. Pt will benefit from skilled OT services to address noted impairments in order to maximize safety and independence and minimize falls  risk. OT recommends HHOT upon discharge.     Follow Up Recommendations  Home health OT    Equipment Recommendations  Tub/shower bench(HH shower head)    Recommendations for Other Services       Precautions / Restrictions Precautions Precautions: Fall Restrictions Weight Bearing Restrictions: No      Mobility Bed Mobility Overal bed mobility: Independent             General bed mobility comments: Pt performed safely   Transfers          General transfer comment: Pt independently returning from using the restroom without use of walker upon start of OT session.     Balance        General Comment: Upon return to bed from bathroom, pt HR 116 and decreasing.                     ADL either performed or assessed with clinical judgement   ADL Overall ADL's : Needs assistance/impaired Eating/Feeding: Independent   Grooming: Independent   Upper Body Bathing: Modified independent;Sitting   Lower Body Bathing: Modified independent;Sitting/lateral leans   Upper Body Dressing : Independent   Lower Body Dressing: Modified independent;Sitting/lateral leans   Toilet Transfer: Independent           Functional mobility during ADLs: Independent       Vision Baseline Vision/History: Wears glasses Wears Glasses: At all times Patient Visual Report: Diplopia;Blurring of vision;No change from baseline(Pt noted blurred vision 2/2 to stopping of meds.  Diplopia at baseline. )       Perception     Praxis      Pertinent Vitals/Pain Pain Assessment:  0-10 Pain Score: 9  Pain Location: Bilateral feet and L hip Pain Descriptors / Indicators: Burning;Numbness;Pins and needles;Tingling Pain Intervention(s): Monitored during session;Limited activity within patient's tolerance     Hand Dominance Right   Extremity/Trunk Assessment Upper Extremity Assessment Upper Extremity Assessment: Overall WFL for tasks assessed;RUE deficits/detail;LUE  deficits/detail RUE Deficits / Details: History of significant wrist fracture; no known precautions LUE Deficits / Details: History of Avascular Necrosis; no known precautions LUE: Unable to fully assess due to immobilization LUE Sensation: history of peripheral neuropathy   Lower Extremity Assessment Lower Extremity Assessment: Overall WFL for tasks assessed;LLE deficits/detail;RLE deficits/detail RLE Deficits / Details: Pain in foot 2/2 neuropathy; pt wears orthopedic shoes and compression socks daily RLE Sensation: history of peripheral neuropathy LLE Deficits / Details: Pain in foot 2/2 neuropathy; pt wears orthopedic shoes and compression socks daily LLE Sensation: history of peripheral neuropathy       Communication Communication Communication: No difficulties(Occasional slurred speech. Pt has difficult time getting words out even though he knows what he wants to say. )   Cognition Arousal/Alertness: Awake/alert Behavior During Therapy: WFL for tasks assessed/performed Overall Cognitive Status: Within Functional Limits for tasks assessed                                     General Comments     Exercises Other Exercises Other Exercises: Pt educated in home modifications around bathing for increased safety/independence. Other Exercises: Pt educated in foot neuropathy mgt including footwear and skin integrity inspection.   Shoulder Instructions      Home Living Family/patient expects to be discharged to:: Private residence Living Arrangements: Alone Available Help at Discharge: Family;Available PRN/intermittently Type of Home: Apartment Home Access: Stairs to enter Entrance Stairs-Number of Steps: 12 Entrance Stairs-Rails: Right;Left(using right railing only due to AVN) Home Layout: One level     Bathroom Shower/Tub: Tub/shower unit;Curtain   Biochemist, clinical: Standard     Home Equipment: Environmental consultant - 2 wheels;Cane - single point;Toilet riser    Additional Comments: Pt is requesting and OT recommending a TTB. Pt takes shower every 4/5 days due to fear of falling.       Prior Functioning/Environment Level of Independence: Independent with assistive device(s)        Comments: Per pt is independent with amb, ADL, and IADL. Reports that he only take showers every 4-5 days d/t fear of falling. Does not drive d/t neuropathy        OT Problem List: Pain;Decreased activity tolerance;Impaired UE functional use      OT Treatment/Interventions: Self-care/ADL training;Therapeutic exercise;Patient/family education;DME and/or AE instruction    OT Goals(Current goals can be found in the care plan section) Acute Rehab OT Goals Patient Stated Goal: To increase independence OT Goal Formulation: With patient Time For Goal Achievement: 10/19/18 Potential to Achieve Goals: Good ADL Goals Pt Will Perform Lower Body Bathing: Independently;with adaptive equipment Additional ADL Goal #1: Pt will independenty utilize bathing hygiene strategies while using AE in shower.  OT Frequency: Min 1X/week   Barriers to D/C:            Co-evaluation              AM-PAC PT "6 Clicks" Daily Activity     Outcome Measure Help from another person eating meals?: None Help from another person taking care of personal grooming?: None Help from another person toileting, which includes using toliet, bedpan, or  urinal?: None Help from another person bathing (including washing, rinsing, drying)?: None Help from another person to put on and taking off regular upper body clothing?: None Help from another person to put on and taking off regular lower body clothing?: None 6 Click Score: 24   End of Session    Activity Tolerance: Patient tolerated treatment well Patient left: in bed;with call bell/phone within reach  OT Visit Diagnosis: Other abnormalities of gait and mobility (R26.89);Pain Pain - Right/Left: Right(both) Pain - part of body: Ankle and  joints of foot                Time: 3810-1751 OT Time Calculation (min): 19 min Charges:     Jadene Pierini OTS  10/05/2018, 4:17 PM

## 2018-10-05 NOTE — Care Management Obs Status (Signed)
[facility] NOTIFICATION   Patient Details  Name: Jesus Crosby. MRN: [id number] Date of Birth: 1965-12-24   Medicare Observation Status Notification Given:  Yes    Jenay Morici A Shatira Dobosz, RN 10/05/2018, 3:49 PM

## 2018-10-05 NOTE — Plan of Care (Addendum)
Medications approached with pt and Md. Orders adjusted according. Every medication currently perscribed as of 19:03 has been discussed one by one with pt and any questions addressed individually. Medications currently match home medication list provided on 10/05/2018 via pt's father. Pt is in agreeance with medication as currently perscribed. Acetylcysteine and Lyrica orders discussed independently with patient as these are orders which were added since admission. Pt verbalized understanding of the later two medications and purpose for administration. Medication list provided via patient father and verified by patient has been placed in patients chart. It is a typed document which is labeled with pt's name and address in top left conner. Pt label has been attached by RN, myself, to right top corner

## 2018-10-05 NOTE — Progress Notes (Signed)
SLP Cancellation Note  Patient Details Name: Jesus Crosby. MRN: [id number] DOB: 12-20-1965   Cancelled treatment:       Reason Eval/Treat Not Completed: SLP screened, no needs identified, will sign off(chart reviewed; consulted NSG, Neurology then met w/ pt). Pt denied any difficulty swallowing and is currently on a regular diet; tolerates swallowing pills w/ water per NSG.  Pt conversed at conversational level w/ SLP (and other staff) w/out any gross deficits noted; no aphasia noted but slight, inconsistent dysfluency of speech noted during initiation of conversation. As Neurologist noted, pt is able to converse in lengthy conversation w/ no language deficits and speech fluency actually improves as conversation continues - suspect pt relaxes vs confrontational speech responses. Per Neurologist's note, it appears that he has had multiple medication changes including multiple pain medications over the past month, and Neurologist suspects much of that is what is causing his current presentation this admission. Pt also has a baseline of OCD, Severe Depression, and Moderate Anxiety per chart notes(recent [facility] note 08/2018).   As pt is able to fully communicate in conversation and indicate all wants/needs appropriately, no further skilled ST services indicated during acute illness and admission. Once discharged home, if pt feels he is in need of further f/u for any dysfluency of his speech that has not returned to his baseline, his PCP can refer to [facility] services then. Encouraged pt to use recommended strategies of Slowing Down, Pausing, Overarticulating, and just taking his time during any conversation. Pt agreed. NSG to reconsult if any change in status.     Orinda Kenner, MS, CCC-SLP Toryn Dewalt 10/05/2018, 2:33 PM

## 2018-10-06 DIAGNOSIS — R479 Unspecified speech disturbances: Secondary | ICD-10-CM | POA: Diagnosis not present

## 2018-10-06 DIAGNOSIS — F429 Obsessive-compulsive disorder, unspecified: Secondary | ICD-10-CM | POA: Diagnosis not present

## 2018-10-06 LAB — BASIC METABOLIC PANEL
Anion gap: 7 (ref 5–15)
BUN: 10 mg/dL (ref 6–20)
CO2: 31 mmol/L (ref 22–32)
Calcium: 9.1 mg/dL (ref 8.9–10.3)
Chloride: 100 mmol/L (ref 98–111)
Creatinine, Ser: 1.02 mg/dL (ref 0.61–1.24)
GFR calc Af Amer: >60 mL/min (ref >60)
Glucose, Bld: 95 mg/dL (ref 70–99)
POTASSIUM: 4.4 mmol/L (ref 3.5–5.1)
SODIUM: 138 mmol/L (ref 135–145)

## 2018-10-06 LAB — HIV ANTIBODY (ROUTINE TESTING W REFLEX): HIV SCREEN 4TH GENERATION: NONREACTIVE

## 2018-10-06 LAB — CK: CK TOTAL: 77 U/L (ref 49–397)

## 2018-10-06 MED ORDER — SODIUM CHLORIDE 0.9% FLUSH
3.0000 mL | Freq: Two times a day (BID) | INTRAVENOUS | Status: DC
  Administered 2018-10-06 – 2018-10-07 (×3): 3 mL via INTRAVENOUS

## 2018-10-06 MED ORDER — ORAL CARE MOUTH RINSE
15.0000 mL | Freq: Two times a day (BID) | OROMUCOSAL | Status: DC
  Administered 2018-10-06 – 2018-10-07 (×3): 15 mL via OROMUCOSAL

## 2018-10-06 MED ORDER — SERTRALINE HCL 50 MG PO TABS
200.0000 mg | ORAL_TABLET | Freq: Every day | ORAL | Status: DC
  Administered 2018-10-06 – 2018-10-07 (×2): 200 mg via ORAL
  Filled 2018-10-06 (×3): qty 4

## 2018-10-06 MED ORDER — RAMELTEON 8 MG PO TABS
8.0000 mg | ORAL_TABLET | Freq: Every day | ORAL | Status: DC
  Administered 2018-10-06: 8 mg via ORAL
  Filled 2018-10-06 (×2): qty 1

## 2018-10-06 NOTE — Care Management Note (Signed)
Case Management Note  Patient Details  Name: Jesus Crosby. MRN: [id number] Date of Birth: 19-Nov-1966  Subjective/Objective:    PT recommends HH. Patient in agreement, he lives alone and is typically independent at baseline. There are some psychiatric concerns and a consult is pending. Patient prefers to use [facility]. Heads up referral placed with [place] from [place]. Will collaborate with care team to ensure safe transition of care plans.             Action/Plan:   Expected Discharge Date:                  Expected Discharge Plan:  [place]  In-House Referral:     Discharge planning Services  CM Consult  Post Acute Care Choice:  Home Health Choice offered to:  Patient  DME Arranged:    DME Agency:     HH Arranged:    [place] Agency:  [facility]  Status of Service:  In process, will continue to follow  If discussed at Long Length of Stay Meetings, dates discussed:    Additional Comments:  Latanya Maudlin, RN 10/06/2018, 9:39 AM

## 2018-10-06 NOTE — Progress Notes (Signed)
[place] [facility] [place] NAME: Jesus Crosby    MR#:  [id number]  DATE OF BIRTH:  29-Dec-1965  SUBJECTIVE:  patient episode of jerky leg movement yesterday. Relieved with IV Ativan. None today. Speech clear.  REVIEW OF SYSTEMS:   Review of Systems  Constitutional: Negative for chills, fever and weight loss.  HENT: Negative for ear discharge, ear pain and nosebleeds.   Eyes: Negative for blurred vision, pain and discharge.  Respiratory: Negative for sputum production, shortness of breath, wheezing and stridor.   Cardiovascular: Negative for chest pain, palpitations, orthopnea and PND.  Gastrointestinal: Negative for abdominal pain, diarrhea, nausea and vomiting.  Genitourinary: Negative for frequency and urgency.  Musculoskeletal: Negative for back pain and joint pain.  Neurological: Positive for weakness. Negative for sensory change, speech change and focal weakness.  Psychiatric/Behavioral: Negative for depression and hallucinations. The patient is not nervous/anxious.    Tolerating Diet:yes Tolerating PT: health PT  DRUG ALLERGIES:   Allergies  Allergen Reactions  . Pineapple Flavor Swelling    Lip swell  . Pineapple Swelling    Lips swelling  . Amoxicillin Nausea And Vomiting    REACTION: Nausea and vomiting  . Penicillins     VITALS:  Blood pressure 119/75, pulse 67, temperature 98.1 F (36.7 C), temperature source Oral, resp. rate 20, height 6\' 3"  (1.905 m), weight 127.9 kg, SpO2 96 %.  PHYSICAL EXAMINATION:   Physical Exam  GENERAL:  52 y.o.-year-old patient lying in the bed with no acute distress. obese EYES: Pupils equal, round, reactive to light and accommodation. No scleral icterus. Extraocular muscles intact.  HEENT: Head atraumatic, normocephalic. Oropharynx and nasopharynx clear.  NECK:  Supple, no jugular venous distention. No thyroid enlargement, no tenderness.  LUNGS: Normal breath sounds bilaterally, no  wheezing, rales, rhonchi. No use of accessory muscles of respiration.  CARDIOVASCULAR: S1, S2 normal. No murmurs, rubs, or gallops.  ABDOMEN: Soft, nontender, nondistended. Bowel sounds present. No organomegaly or mass.  EXTREMITIES: No cyanosis, clubbing or edema b/l.    NEUROLOGIC: Cranial nerves II through XII are intact. Decreased sensation in both LE PSYCHIATRIC:  patient is alert and oriented x 3.  SKIN: No obvious rash, lesion, or ulcer.   LABORATORY PANEL:  CBC Recent Labs  Lab 10/05/18 0506  WBC 5.3  HGB 12.6*  HCT 37.8*  PLT 174    Chemistries  Recent Labs  Lab 10/04/18 1618  10/06/18 0749  NA 133*   < > 138  K 4.4   < > 4.4  CL 92*   < > 100  CO2 29   < > 31  GLUCOSE 83   < > 95  BUN 10   < > 10  CREATININE 1.10   < > 1.02  CALCIUM 9.5   < > 9.1  AST 57*  --   --   ALT 56*  --   --   ALKPHOS 54  --   --   BILITOT 0.9  --   --    < > = values in this interval not displayed.   Cardiac Enzymes Recent Labs  Lab 10/04/18 1618  TROPONINI <0.03   RADIOLOGY:  Ct Head Wo Contrast  Result Date: 10/04/2018 CLINICAL DATA:  Slurred speech and unsteady gait. Headache and blurred vision for a week. EXAM: CT HEAD WITHOUT CONTRAST TECHNIQUE: Contiguous axial images were obtained from the base of the skull through the vertex without intravenous contrast. COMPARISON:  Apr 29, 2016 FINDINGS: Brain: No subdural, epidural, or subarachnoid hemorrhage. No mass effect or midline shift. Ventricles and sulci are normal. Cerebellum, brainstem, and basal cisterns are within normal limits. No acute cortical ischemia or infarct identified. Vascular: No hyperdense vessel or unexpected calcification. Skull: Normal. Negative for fracture or focal lesion. Sinuses/Orbits: No acute finding. Other: None. IMPRESSION: No acute intracranial abnormalities. Electronically Signed   By: Dorise Bullion III M.D   On: 10/04/2018 17:05   Mr Jodene Nam Head Wo Contrast  Result Date: 10/05/2018 CLINICAL DATA:   Initial evaluation for approximate 1 week history of blurry vision, slurred speech, word difficulty. History of prior stroke. EXAM: MRI HEAD WITHOUT CONTRAST MRA HEAD WITHOUT CONTRAST TECHNIQUE: Multiplanar, multiecho pulse sequences of the brain and surrounding structures were obtained without intravenous contrast. Angiographic images of the head were obtained using MRA technique without contrast. COMPARISON:  Prior CT from earlier the same day as well as previous MRI from 06/03/2015. FINDINGS: MRI HEAD FINDINGS Brain: Cerebral volume within normal limits for age. No significant cerebral white matter changes. No abnormal foci of restricted diffusion to suggest or subacute ischemia. Gray-white matter differentiation maintained. No areas of remote cortical infarction. No acute or chronic intracranial hemorrhage. No mass lesion, midline shift or mass effect. No hydrocephalus. No extra-axial fluid collection. Incidental note made of an empty sella, similar to previous. Vascular: Major intracranial vascular flow voids maintained. Skull and upper cervical spine: Craniocervical junction normal. Upper cervical spine within normal limits. Bone marrow signal intensity normal. No scalp soft tissue abnormality. Sinuses/Orbits: Dilatation of the optic nerve sheaths noted bilaterally, similar to previous. Patient status post ocular lens replacement bilaterally. Paranasal sinuses are largely clear. No mastoid effusion. Inner ear structures grossly normal. Other: None. MRA HEAD FINDINGS ANTERIOR CIRCULATION: Distal cervical segments of the internal carotid arteries widely patent with antegrade flow. Petrous, cavernous, and supraclinoid segments widely patent without hemodynamically significant stenosis. Origin of the ophthalmic arteries patent bilaterally. ICA termini symmetric and well opacified. A1 segments patent bilaterally. Normal anterior communicating artery. Anterior cerebral arteries widely patent to their distal aspects.  Short-segment mild stenoses at the proximal right M1 and distal left M1 segments. Normal MCA bifurcations. Distal MCA branches well perfused and symmetric. POSTERIOR CIRCULATION: Vertebral arteries patent to the vertebrobasilar junction without stenosis. Left vertebral artery dominant. Posterior inferior cerebral arteries patent bilaterally. Basilar patent to its distal aspect without stenosis superior cerebral arteries patent bilaterally. Hypoplastic P1 segments with prominent bilateral posterior communicating arteries. PCAs well perfused to their distal aspects. No intracranial aneurysm or other vascular abnormality. IMPRESSION: MRI HEAD IMPRESSION: 1. No acute intracranial infarct or other abnormality identified. 2. Empty sella with dilatation of the optic nerve sheaths, suggesting possible idiopathic intracranial hypertension. Appearance is stable relative to previous exams. MRA HEAD IMPRESSION: Minor intracranial atherosclerotic change for patient age. No large vessel occlusion. No hemodynamically significant or correctable stenosis. Electronically Signed   By: Jeannine Boga M.D.   On: 10/05/2018 01:49   Mr Brain Wo Contrast  Result Date: 10/05/2018 CLINICAL DATA:  Initial evaluation for approximate 1 week history of blurry vision, slurred speech, word difficulty. History of prior stroke. EXAM: MRI HEAD WITHOUT CONTRAST MRA HEAD WITHOUT CONTRAST TECHNIQUE: Multiplanar, multiecho pulse sequences of the brain and surrounding structures were obtained without intravenous contrast. Angiographic images of the head were obtained using MRA technique without contrast. COMPARISON:  Prior CT from earlier the same day as well as previous MRI from 06/03/2015. FINDINGS: MRI HEAD FINDINGS Brain: Cerebral volume within normal limits  for age. No significant cerebral white matter changes. No abnormal foci of restricted diffusion to suggest or subacute ischemia. Gray-white matter differentiation maintained. No areas of  remote cortical infarction. No acute or chronic intracranial hemorrhage. No mass lesion, midline shift or mass effect. No hydrocephalus. No extra-axial fluid collection. Incidental note made of an empty sella, similar to previous. Vascular: Major intracranial vascular flow voids maintained. Skull and upper cervical spine: Craniocervical junction normal. Upper cervical spine within normal limits. Bone marrow signal intensity normal. No scalp soft tissue abnormality. Sinuses/Orbits: Dilatation of the optic nerve sheaths noted bilaterally, similar to previous. Patient status post ocular lens replacement bilaterally. Paranasal sinuses are largely clear. No mastoid effusion. Inner ear structures grossly normal. Other: None. MRA HEAD FINDINGS ANTERIOR CIRCULATION: Distal cervical segments of the internal carotid arteries widely patent with antegrade flow. Petrous, cavernous, and supraclinoid segments widely patent without hemodynamically significant stenosis. Origin of the ophthalmic arteries patent bilaterally. ICA termini symmetric and well opacified. A1 segments patent bilaterally. Normal anterior communicating artery. Anterior cerebral arteries widely patent to their distal aspects. Short-segment mild stenoses at the proximal right M1 and distal left M1 segments. Normal MCA bifurcations. Distal MCA branches well perfused and symmetric. POSTERIOR CIRCULATION: Vertebral arteries patent to the vertebrobasilar junction without stenosis. Left vertebral artery dominant. Posterior inferior cerebral arteries patent bilaterally. Basilar patent to its distal aspect without stenosis superior cerebral arteries patent bilaterally. Hypoplastic P1 segments with prominent bilateral posterior communicating arteries. PCAs well perfused to their distal aspects. No intracranial aneurysm or other vascular abnormality. IMPRESSION: MRI HEAD IMPRESSION: 1. No acute intracranial infarct or other abnormality identified. 2. Empty sella with  dilatation of the optic nerve sheaths, suggesting possible idiopathic intracranial hypertension. Appearance is stable relative to previous exams. MRA HEAD IMPRESSION: Minor intracranial atherosclerotic change for patient age. No large vessel occlusion. No hemodynamically significant or correctable stenosis. Electronically Signed   By: Jeannine Boga M.D.   On: 10/05/2018 01:49   US Carotid Bilateral (at [facility] Only)  Result Date: 10/05/2018 CLINICAL DATA:  Aphasia EXAM: BILATERAL CAROTID DUPLEX ULTRASOUND TECHNIQUE: Pearline Cables scale imaging, color Doppler and duplex ultrasound were performed of bilateral carotid and vertebral arteries in the neck. COMPARISON:  None. FINDINGS: Criteria: Quantification of carotid stenosis is based on velocity parameters that correlate the residual internal carotid diameter with NASCET-based stenosis levels, using the diameter of the distal internal carotid lumen as the denominator for stenosis measurement. The following velocity measurements were obtained: RIGHT ICA: 74 cm/sec CCA: 740 cm/sec SYSTOLIC ICA/CCA RATIO:  0.6 ECA: 109 cm/sec LEFT ICA: 64 cm/sec CCA: 814 cm/sec SYSTOLIC ICA/CCA RATIO:  0.6 ECA: 122 cm/sec RIGHT CAROTID ARTERY: Little if any plaque in the bulb. Low resistance internal carotid Doppler pattern. RIGHT VERTEBRAL ARTERY:  Antegrade. LEFT CAROTID ARTERY: Minimal calcified plaque in the bulb. Low resistance internal carotid Doppler pattern. LEFT VERTEBRAL ARTERY:  Antegrade. IMPRESSION: Less than 50% stenosis in the right and left internal carotid arteries. Electronically Signed   By: Marybelle Killings M.D.   On: 10/05/2018 09:32   ASSESSMENT AND PLAN:  Jesus Crosby  is a 52 y.o. male with a known history of homocystinuria and stroke at age 34 presents with blurred vision going on for over the last week.  Also having worsening slurred speech and trouble putting his words together.  1.  Aphasia and blurred vision.  Patient states this is similar to the stroke  symptoms that he has had in the past.  With his homocystinuria he could  develop a venous thrombosis. - MRI of the brain and an MRA of the brain negative -  Echocardiogram with bubble study pending-- within normal limits - carotid ultrasound no stenosis -  PT, OT and neurology consultations. Appreciated -  Continue aspirin full dose at this point and increase Lipitor to 40 mg daily and check a lipid profile. -EEG negative for epileptiform activity  2.  Homocystinuria.  Continue the patient's NAC (although pt says he does not take it, and shortage of it in the hospital), B12, Cystadane and B6 and folic acid supplementation -he has a neurologist who he follows for last 24 years at [facility]. Patient asked to keep that appointment.  3.  Insomnia and severe depression continue his psychiatric medications -by Dr. Bary Leriche. Patient resume back on Zoloft. Trazodone on hold  4.  History of hypertension.  Resume all antihypertensives at this time.   5.  Hyperlipidemia unspecified check a lipid profile in the morning increase Lipitor to 40 mg  6.  Obesity and sleep apnea Oxygen at bedtime  7. History of avascular necrosis  Case discussed with Care Management/Social Worker. Management plans discussed with the patient, family and they are in agreement.  CODE STATUS: full DVT Prophylaxis: lovenox  TOTAL TIME TAKING CARE OF THIS PATIENT: *30* minutes.  >50% time spent on counselling and coordination of care  POSSIBLE D/C IN 1 DAYS, DEPENDING ON CLINICAL CONDITION.  Note: This dictation was prepared with Dragon dictation along with smaller phrase technology. Any transcriptional errors that result from this process are unintentional.  Fritzi Mandes M.D on 10/06/2018 at 2:48 PM  Between 7am to 6pm - Pager - [phone]  After 6pm go to [email or web address] - password [email or web address] [facility]  Office  [phone]  CC: Primary care physician; Ronnell Freshwater, NPPatient ID: Jesus Glassing., male   DOB: 04/22/1966, 52 y.o.   MRN: [id number]

## 2018-10-06 NOTE — Progress Notes (Signed)
Pt very focused on meds and gives history/use with discrepancies. Father brought in current med list and sent to pharmacy for review. Telemetry discontinued with NSR.  Zoloft 200 mg po started. Pt had episode of restless leg like activity with pt and dad reassured. Pt states he wears his 02 only at night; currently off.  Denies pain.

## 2018-10-06 NOTE — Progress Notes (Signed)
Subjective: Last night before start of Lyrica had involuntary movements of the lower extremities that required Ativan.  None are evident today.    Objective: Current vital signs: BP 119/75 (BP Location: Left Arm)   Pulse 74   Temp 97.6 F (36.4 C) (Oral)   Resp 20   Ht 6\' 3"  (1.905 m)   Wt 127.9 kg   SpO2 96%   BMI 35.24 kg/m  Vital signs in last 24 hours: Temp:  [97.5 F (36.4 C)-98.4 F (36.9 C)] 97.6 F (36.4 C) (11/02 0953) Pulse Rate:  [67-176] 74 (11/02 0953) Resp:  [16-20] 20 (11/02 0953) BP: (111-186)/(75-98) 119/75 (11/02 0953) SpO2:  [94 %-99 %] 96 % (11/02 0953)  Intake/Output from previous day: 11/01 0701 - 11/02 0700 In: 360 [P.O.:360] Out: 500 [Urine:500] Intake/Output this shift: Total I/O In: 120 [P.O.:120] Out: -  Nutritional status:  Diet Order            Diet 2 gram sodium Room service appropriate? Yes; Fluid consistency: Thin  Diet effective now              Neurologic Exam: Mental Status: Alert, oriented, thought content appropriate. Speech fluent without evidence of aphasia. Able to follow 3 step commands without difficulty.  Cranial Nerves: II: Discs flat bilaterally; Visual fields grossly normal, pupils equal, round, reactive to light and accommodation III,IV, VI: ptosis not present, extra-ocular motions intact bilaterally V,VII: smile symmetric, facial light touch sensationintact VIII: hearing normal bilaterally IX,X: gag reflex present XI: bilateral shoulder shrug XII: midline tongue extension Motor: Right :Upper extremity 5/5Without pronator driftLeft: Upper extremity 5/5 without pronator drift Patient unable to lift either lower extremity off the bed   Lab Results: Basic Metabolic Panel: Recent Labs  Lab 10/04/18 1618 10/05/18 0506 10/06/18 0749  NA 133* 137 138  K 4.4 4.1 4.4  CL 92* 99 100  CO2 29 29 31   GLUCOSE 83 87 95  BUN 10 10 10   CREATININE 1.10 0.99 1.02  CALCIUM 9.5 8.9 9.1    Liver  Function Tests: Recent Labs  Lab 10/04/18 1618  AST 57*  ALT 56*  ALKPHOS 54  BILITOT 0.9  PROT 9.0*  ALBUMIN 4.7   No results for input(s): LIPASE, AMYLASE in the last 168 hours. No results for input(s): AMMONIA in the last 168 hours.  CBC: Recent Labs  Lab 10/04/18 1618 10/05/18 0506  WBC 7.5 5.3  NEUTROABS 3.8  --   HGB 14.4 12.6*  HCT 42.8 37.8*  MCV 89.4 89.8  PLT 210 174    Cardiac Enzymes: Recent Labs  Lab 10/04/18 1618 10/06/18 0749  CKTOTAL  --  77  TROPONINI <0.03  --     Lipid Panel: Recent Labs  Lab 10/05/18 0506  CHOL 211*  TRIG 409*  HDL 28*  CHOLHDL 7.5  VLDL UNABLE TO CALCULATE IF TRIGLYCERIDE OVER 400 mg/dL  LDLCALC UNABLE TO CALCULATE IF TRIGLYCERIDE OVER 400 mg/dL    CBG: Recent Labs  Lab 10/04/18 [place] 80    Microbiology: No results found for this or any previous visit.  Coagulation Studies: Recent Labs    10/04/18 1649  LABPROT 14.4  INR 1.13    Imaging: Ct Head Wo Contrast  Result Date: 10/04/2018 CLINICAL DATA:  Slurred speech and unsteady gait. Headache and blurred vision for a week. EXAM: CT HEAD WITHOUT CONTRAST TECHNIQUE: Contiguous axial images were obtained from the base of the skull through the vertex without intravenous contrast. COMPARISON:  Apr 29, 2016 FINDINGS: Brain: No subdural, epidural, or subarachnoid hemorrhage. No mass effect or midline shift. Ventricles and sulci are normal. Cerebellum, brainstem, and basal cisterns are within normal limits. No acute cortical ischemia or infarct identified. Vascular: No hyperdense vessel or unexpected calcification. Skull: Normal. Negative for fracture or focal lesion. Sinuses/Orbits: No acute finding. Other: None. IMPRESSION: No acute intracranial abnormalities. Electronically Signed   By: Dorise Bullion III M.D   On: 10/04/2018 17:05   Mr Jodene Nam Head Wo Contrast  Result Date: 10/05/2018 CLINICAL DATA:  Initial evaluation for approximate 1 week history of blurry  vision, slurred speech, word difficulty. History of prior stroke. EXAM: MRI HEAD WITHOUT CONTRAST MRA HEAD WITHOUT CONTRAST TECHNIQUE: Multiplanar, multiecho pulse sequences of the brain and surrounding structures were obtained without intravenous contrast. Angiographic images of the head were obtained using MRA technique without contrast. COMPARISON:  Prior CT from earlier the same day as well as previous MRI from 06/03/2015. FINDINGS: MRI HEAD FINDINGS Brain: Cerebral volume within normal limits for age. No significant cerebral white matter changes. No abnormal foci of restricted diffusion to suggest or subacute ischemia. Gray-white matter differentiation maintained. No areas of remote cortical infarction. No acute or chronic intracranial hemorrhage. No mass lesion, midline shift or mass effect. No hydrocephalus. No extra-axial fluid collection. Incidental note made of an empty sella, similar to previous. Vascular: Major intracranial vascular flow voids maintained. Skull and upper cervical spine: Craniocervical junction normal. Upper cervical spine within normal limits. Bone marrow signal intensity normal. No scalp soft tissue abnormality. Sinuses/Orbits: Dilatation of the optic nerve sheaths noted bilaterally, similar to previous. Patient status post ocular lens replacement bilaterally. Paranasal sinuses are largely clear. No mastoid effusion. Inner ear structures grossly normal. Other: None. MRA HEAD FINDINGS ANTERIOR CIRCULATION: Distal cervical segments of the internal carotid arteries widely patent with antegrade flow. Petrous, cavernous, and supraclinoid segments widely patent without hemodynamically significant stenosis. Origin of the ophthalmic arteries patent bilaterally. ICA termini symmetric and well opacified. A1 segments patent bilaterally. Normal anterior communicating artery. Anterior cerebral arteries widely patent to their distal aspects. Short-segment mild stenoses at the proximal right M1 and  distal left M1 segments. Normal MCA bifurcations. Distal MCA branches well perfused and symmetric. POSTERIOR CIRCULATION: Vertebral arteries patent to the vertebrobasilar junction without stenosis. Left vertebral artery dominant. Posterior inferior cerebral arteries patent bilaterally. Basilar patent to its distal aspect without stenosis superior cerebral arteries patent bilaterally. Hypoplastic P1 segments with prominent bilateral posterior communicating arteries. PCAs well perfused to their distal aspects. No intracranial aneurysm or other vascular abnormality. IMPRESSION: MRI HEAD IMPRESSION: 1. No acute intracranial infarct or other abnormality identified. 2. Empty sella with dilatation of the optic nerve sheaths, suggesting possible idiopathic intracranial hypertension. Appearance is stable relative to previous exams. MRA HEAD IMPRESSION: Minor intracranial atherosclerotic change for patient age. No large vessel occlusion. No hemodynamically significant or correctable stenosis. Electronically Signed   By: Jeannine Boga M.D.   On: 10/05/2018 01:49   Mr Brain Wo Contrast  Result Date: 10/05/2018 CLINICAL DATA:  Initial evaluation for approximate 1 week history of blurry vision, slurred speech, word difficulty. History of prior stroke. EXAM: MRI HEAD WITHOUT CONTRAST MRA HEAD WITHOUT CONTRAST TECHNIQUE: Multiplanar, multiecho pulse sequences of the brain and surrounding structures were obtained without intravenous contrast. Angiographic images of the head were obtained using MRA technique without contrast. COMPARISON:  Prior CT from earlier the same day as well as previous MRI from 06/03/2015. FINDINGS: MRI HEAD FINDINGS Brain: Cerebral volume within normal limits  for age. No significant cerebral white matter changes. No abnormal foci of restricted diffusion to suggest or subacute ischemia. Gray-white matter differentiation maintained. No areas of remote cortical infarction. No acute or chronic  intracranial hemorrhage. No mass lesion, midline shift or mass effect. No hydrocephalus. No extra-axial fluid collection. Incidental note made of an empty sella, similar to previous. Vascular: Major intracranial vascular flow voids maintained. Skull and upper cervical spine: Craniocervical junction normal. Upper cervical spine within normal limits. Bone marrow signal intensity normal. No scalp soft tissue abnormality. Sinuses/Orbits: Dilatation of the optic nerve sheaths noted bilaterally, similar to previous. Patient status post ocular lens replacement bilaterally. Paranasal sinuses are largely clear. No mastoid effusion. Inner ear structures grossly normal. Other: None. MRA HEAD FINDINGS ANTERIOR CIRCULATION: Distal cervical segments of the internal carotid arteries widely patent with antegrade flow. Petrous, cavernous, and supraclinoid segments widely patent without hemodynamically significant stenosis. Origin of the ophthalmic arteries patent bilaterally. ICA termini symmetric and well opacified. A1 segments patent bilaterally. Normal anterior communicating artery. Anterior cerebral arteries widely patent to their distal aspects. Short-segment mild stenoses at the proximal right M1 and distal left M1 segments. Normal MCA bifurcations. Distal MCA branches well perfused and symmetric. POSTERIOR CIRCULATION: Vertebral arteries patent to the vertebrobasilar junction without stenosis. Left vertebral artery dominant. Posterior inferior cerebral arteries patent bilaterally. Basilar patent to its distal aspect without stenosis superior cerebral arteries patent bilaterally. Hypoplastic P1 segments with prominent bilateral posterior communicating arteries. PCAs well perfused to their distal aspects. No intracranial aneurysm or other vascular abnormality. IMPRESSION: MRI HEAD IMPRESSION: 1. No acute intracranial infarct or other abnormality identified. 2. Empty sella with dilatation of the optic nerve sheaths, suggesting  possible idiopathic intracranial hypertension. Appearance is stable relative to previous exams. MRA HEAD IMPRESSION: Minor intracranial atherosclerotic change for patient age. No large vessel occlusion. No hemodynamically significant or correctable stenosis. Electronically Signed   By: Jeannine Boga M.D.   On: 10/05/2018 01:49   US Carotid Bilateral (at [facility] And [facility] Only)  Result Date: 10/05/2018 CLINICAL DATA:  Aphasia EXAM: BILATERAL CAROTID DUPLEX ULTRASOUND TECHNIQUE: Pearline Cables scale imaging, color Doppler and duplex ultrasound were performed of bilateral carotid and vertebral arteries in the neck. COMPARISON:  None. FINDINGS: Criteria: Quantification of carotid stenosis is based on velocity parameters that correlate the residual internal carotid diameter with NASCET-based stenosis levels, using the diameter of the distal internal carotid lumen as the denominator for stenosis measurement. The following velocity measurements were obtained: RIGHT ICA: 74 cm/sec CCA: 169 cm/sec SYSTOLIC ICA/CCA RATIO:  0.6 ECA: 109 cm/sec LEFT ICA: 64 cm/sec CCA: 450 cm/sec SYSTOLIC ICA/CCA RATIO:  0.6 ECA: 122 cm/sec RIGHT CAROTID ARTERY: Little if any plaque in the bulb. Low resistance internal carotid Doppler pattern. RIGHT VERTEBRAL ARTERY:  Antegrade. LEFT CAROTID ARTERY: Minimal calcified plaque in the bulb. Low resistance internal carotid Doppler pattern. LEFT VERTEBRAL ARTERY:  Antegrade. IMPRESSION: Less than 50% stenosis in the right and left internal carotid arteries. Electronically Signed   By: Marybelle Killings M.D.   On: 10/05/2018 09:32    Medications:  I have reviewed the patient's current medications. Scheduled: Marland Kitchen Acetylcysteine  1,800 mg Oral QHS  . ALPRAZolam  0.5 mg Oral TID  . amLODipine  5 mg Oral BID  . aspirin EC  325 mg Oral Daily  . atorvastatin  40 mg Oral q1800  . CYSTADANE  8 Scoop Oral TID  . dicyclomine  10 mg Oral Q6H  . enoxaparin (LOVENOX) injection  40 mg Subcutaneous Q24H  .  folic  acid  1,103 mcg Oral QHS  . furosemide  20 mg Oral Daily  . gabapentin  1,200 mg Oral BID  . losartan  25 mg Oral Daily  . metoprolol tartrate  75 mg Oral BID  . pregabalin  50 mg Oral BID  . pyridOXINE  500 mg Oral QHS  . sertraline  200 mg Oral Daily  . sodium chloride flush  3 mL Intravenous Q12H  . vitamin B-12  1,000 mcg Oral QHS    Assessment/Plan: Speech improved today.  With involuntary movements last evening that may represent a severe RLS.  Patient now on low dose Lyrica which may help.  EEG shows no epileptiform activity.    Recommendations: 1. Continue low dose Lyrica   LOS: 1 day   Alexis Goodell, MD Neurology [phone] 10/06/2018  12:21 PM

## 2018-10-06 NOTE — Consult Note (Signed)
[facility] Consult   Reason for Consult: Serotonin syndrome   Referring Physician:  Dr. Posey Pronto Patient Identification: Jesus Crosby. MRN:  [id number] Principal Diagnosis: OCD (obsessive compulsive disorder) Diagnosis:   Patient Active Problem List   Diagnosis Date Noted  . OCD (obsessive compulsive disorder) [F42.9]     Priority: High  . Aphasia [R47.01] 10/04/2018  . Arthritis [M19.90] 04/26/2018  . Arthritis of knee [M17.10] 04/26/2018  . Bilateral cataracts [H26.9] 04/26/2018  . Class 1 obesity [E66.9] 04/26/2018  . Disorder of lens [H27.9] 04/26/2018  . Exomphalos [Q79.2] 04/26/2018  . OSA on CPAP [G47.33, Z99.89] 04/26/2018  . Prediabetes [R73.03] 04/26/2018  . History of total hip arthroplasty [Z96.649] 07/05/2017  . Spinal stenosis of lumbar region [M48.061] 04/25/2017  . Trochanteric bursitis [M70.60] 02/27/2017  . S/P laparoscopic sleeve gastrectomy [Z98.84] 08/17/2016  . Alcohol-induced mood disorder ([place]) [F10.94]   . Paresthesia [R20.2] 06/10/2015  . Diplopia [H53.2] 08/15/2014  . Sinus tachycardia [R00.0] 08/15/2014  . CVA (cerebral infarction) [I63.9] 08/14/2014  . Neuropathy [G62.9] 04/04/2014  . Intraocular lens dislocation [T85.22XA] 09/12/2013  . DDD (degenerative disc disease), lumbosacral [M51.37] 07/04/2013  . HTN (hypertension) [I10] 07/04/2013  . Major depressive disorder, single episode [F32.9] 07/03/2009  . 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 HYPERLIPIDEMIA [E78.5] 05/28/2008  . ANXIETY [F41.1] 05/28/2008  . OBSESSIVE-COMPULSIVE DISORDER [F42.9] 05/28/2008    Total Time spent with patient: 1 hour  Subjective:   Identifying data. Jesus Crosby is a 52 year old male with a history pf severe OCD.  Chief complaint. "Something happened to my legs."  History of present illness. Information was obtained from the patient and the chart. The patient was admitted to  Medical floor with symptoms  suggestive of stroke but was ruled out and his symptoms were attributed to abrupt discontinuation of Lyrica in the community. He was restarted on low dose Lyrica here. Last night, I was called by Dr. Leslye Peer that the patient developed unintentional and uncontrollable movements of lower extremities with some stiffness accompanied by tachycardia of 180 that eventually resolved with Ativan and Metoprolol. Since serotonin syndrome was a possibility, his Zyprexa, Zoloft and Trazodone were discontinued.   This morning, the patient no longer experiences involuntary leg movements. There is no stiffness but still slight jerking of both legs. The patient denies any symptoms of depression or psychosis. He is anxious and unsettled by new symptoms.   Past psychiatric history. The patient suffers homocystinuria which is often is accompanied with mental illness. In his case severe OCD for which he has been prescribed all SSRIs I could name. For the past 2 years, he has been taking Zoloft 200 mg for OCD and Trazodone 200 mg for sleep. He was taking 300 mg of Zoloft in the past. He has never experienced problems. Rather, medications stopped working after a period of time. He reports extremely poor sleep with Trazodone. He has been tried on Risperdal, Seroquel and Abilify in the past with no improvement. He claims that he has never been on Zyprexa that is listed as home medication. Denies ever being psychotic. He was hospitalized at [facility] for depression/anxiety. He has never felt suicidal.   He sees pain as a main obstacle. He has been in the care of [facility] pain clinic. There have been medication adjustments lately. He is not getting painkillers as he is on Xanax 0.5 mg TID for panic attacks. He understands that he may be asked to chose. He has been on  the Xanax for many years.  Family psychiatric history. None.  Social history. He is disabled from [place]. He lives alone in an apartment on the second floor. Due to  neuropathic pain, he has difficulties walking but every day spends 4 hours at his parent's house.   Risk to Self:   Risk to Others:   Prior Inpatient Therapy:   Prior Outpatient Therapy:    Past Medical History:  Past Medical History:  Diagnosis Date  . Anxiety   . Arthritis    knees,Right wrist  . Avascular necrosis ([place])   . Brain bleed ([place])   . Cataract    bilateral repair with lens implants  . Depression   . Family history of adverse reaction to anesthesia    n/v-mom  . GERD (gastroesophageal reflux disease)   . Homocystinuria ([place])   . Hypertension   . Idiopathic progressive polyneuropathy   . Lens disease   . Neuromuscular disorder ([place])   . Obesity, Class II, BMI 35-39.9   . OCD (obsessive compulsive disorder)   . Paresthesia 06/10/2015  . Sleep apnea   . Stroke [facility])    with brain bleed at age 70-no deficits  . Umbilical hernia     Past Surgical History:  Procedure Laterality Date  . COLONOSCOPY WITH PROPOFOL N/A 07/30/2015   Procedure: COLONOSCOPY WITH PROPOFOL;  Surgeon: [name], MD;  Location: [facility];  Service: Endoscopy;  Laterality: N/A;  . DERMABRASION OF FACE     due to acne scars  . distal radius fracture of right hand (plates, screws, and pins)  2011  . ESOPHAGOGASTRODUODENOSCOPY (EGD) WITH PROPOFOL N/A 07/30/2015   Procedure: ESOPHAGOGASTRODUODENOSCOPY (EGD) WITH PROPOFOL;  Surgeon: [name], MD;  Location: [facility];  Service: Endoscopy;  Laterality: N/A;  . EYE SURGERY     LASER + SURG BIL   . GAS/FLUID EXCHANGE Right 09/17/2013   Procedure: GAS/FLUID EXCHANGE;  Surgeon: Hayden Pedro, MD;  Location: [place];  Service: Ophthalmology;  Laterality: Right;  . gastric sleeve surgery  08/17/2016  . HIP ARTHROPLASTY Left   . INTRAOCULAR LENS INSERTION Bilateral    lens disease due to homocysteinuria  . NASAL SEPTUM SURGERY  march 2016   @ [facility]  . PARS PLANA VITRECTOMY Right 09/17/2013   Procedure: PARS PLANA VITRECTOMY WITH 25G  REMOVAL/SUTURE SECONDARY INTRAOCULAR LENS;  Surgeon: Hayden Pedro, MD;  Location: [place];  Service: Ophthalmology;  Laterality: Right;  . PHOTOCOAGULATION Right 09/17/2013   Procedure: PHOTOCOAGULATION;  Surgeon: Hayden Pedro, MD;  Location: [place];  Service: Ophthalmology;  Laterality: Right;  HEADSCOPE LASER  . scapholunate ligament reconstruction of right hand  2011  . UMBILICAL HERNIA REPAIR N/A 05/07/2015   Procedure: HERNIA REPAIR UMBILICAL ADULT;  Surgeon: Florene Glen, MD;  Location: [facility];  Service: General;  Laterality: N/A;  . WRIST SURGERY Right    Family History:  Family History  Problem Relation Age of Onset  . Breast cancer Mother   . Hypertension Mother   . Hypertension Father   . Stroke Maternal Grandmother   . Stroke Maternal Grandfather   . Stroke Paternal Grandmother   . Stroke Paternal Grandfather   . Stroke Other     Social History:  Social History   Substance and Sexual Activity  Alcohol Use No  . Alcohol/week: 0.0 standard drinks     Social History   Substance and Sexual Activity  Drug Use No    Social History   Socioeconomic  History  . Marital status: Single    Spouse name: n/a  . Number of children: 0  . Years of education: college  . Highest education level: Not on file  Occupational History  . Occupation: unemployed/disabled    Comment: Lexicographer  Social Needs  . Financial resource strain: Not hard at all  . Food insecurity:    Worry: Never true    Inability: Never true  . Transportation needs:    Medical: Yes    Non-medical: Yes  Tobacco Use  . Smoking status: Never Smoker  . Smokeless tobacco: Never Used  Substance and Sexual Activity  . Alcohol use: No    Alcohol/week: 0.0 standard drinks  . Drug use: No  . Sexual activity: Not on file  Lifestyle  . Physical activity:    Days per week: 0 days    Minutes per session: 0 min  . Stress: Not at all  Relationships  . Social connections:    Talks on phone:  Once a week    Gets together: Once a week    Attends religious service: Never    Active member of club or organization: No    Attends meetings of clubs or organizations: Never    Relationship status: Never married  Other Topics Concern  . Not on file  Social History Narrative   Lives alone. Parents live nearby.  Applying for disability (OCD) due to wrist injury, hearing upcoming.      Patient drinks 4-5 cups of caffeine daily.   Patient is right handed.   Additional Social History:    Allergies:   Allergies  Allergen Reactions  . Pineapple Flavor Swelling    Lip swell  . Pineapple Swelling    Lips swelling  . Amoxicillin Nausea And Vomiting    REACTION: Nausea and vomiting  . Penicillins     Labs:  Results for orders placed or performed during the hospital encounter of 10/04/18 (from the past 48 hour(s))  Glucose, capillary     Status: None   Collection Time: 10/04/18  4:14 PM  Result Value Ref Range   Glucose-Capillary 80 70 - 99 mg/dL  CBC     Status: None   Collection Time: 10/04/18  4:18 PM  Result Value Ref Range   WBC 7.5 4.0 - 10.5 K/uL   RBC 4.79 4.22 - 5.81 MIL/uL   Hemoglobin 14.4 13.0 - 17.0 g/dL   HCT 42.8 39.0 - 52.0 %   MCV 89.4 80.0 - 100.0 fL   MCH 30.1 26.0 - 34.0 pg   MCHC 33.6 30.0 - 36.0 g/dL   RDW 13.2 11.5 - 15.5 %   Platelets 210 150 - 400 K/uL   nRBC 0.0 0.0 - 0.2 %    Comment: Performed at [facility], [address] [place], [place] [address]  Differential     Status: None   Collection Time: 10/04/18  4:18 PM  Result Value Ref Range   Neutrophils Relative % 50 %   Neutro Abs 3.8 1.7 - 7.7 K/uL   Lymphocytes Relative 38 %   Lymphs Abs 2.9 0.7 - 4.0 K/uL   Monocytes Relative 10 %   Monocytes Absolute 0.7 0.1 - 1.0 K/uL   Eosinophils Relative 1 %   Eosinophils Absolute 0.1 0.0 - 0.5 K/uL   Basophils Relative 1 %   Basophils Absolute 0.0 0.0 - 0.1 K/uL   Immature Granulocytes 0 %   Abs Immature Granulocytes 0.02 0.00 -  0.07 K/uL  Comment: Performed at [facility], [address] [place], [place] [address]  Comprehensive metabolic panel     Status: Abnormal   Collection Time: 10/04/18  4:18 PM  Result Value Ref Range   Sodium 133 (L) 135 - 145 mmol/L   Potassium 4.4 3.5 - 5.1 mmol/L    Comment: HEMOLYSIS AT THIS LEVEL MAY AFFECT RESULT   Chloride 92 (L) 98 - 111 mmol/L   CO2 29 22 - 32 mmol/L   Glucose, Bld 83 70 - 99 mg/dL   BUN 10 6 - 20 mg/dL   Creatinine, Ser 1.10 0.61 - 1.24 mg/dL   Calcium 9.5 8.9 - 10.3 mg/dL   Total Protein 9.0 (H) 6.5 - 8.1 g/dL   Albumin 4.7 3.5 - 5.0 g/dL   AST 57 (H) 15 - 41 U/L   ALT 56 (H) 0 - 44 U/L   Alkaline Phosphatase 54 38 - 126 U/L   Total Bilirubin 0.9 0.3 - 1.2 mg/dL   GFR calc non Af Amer >60 >60 mL/min   GFR calc Af Amer >60 >60 mL/min    Comment: (NOTE) The eGFR has been calculated using the CKD EPI equation. This calculation has not been validated in all clinical situations. eGFR's persistently <60 mL/min signify possible Chronic Kidney Disease.    Anion gap 12 5 - 15    Comment: Performed at [facility], [address] [place], [place] [address]  Troponin I     Status: None   Collection Time: 10/04/18  4:18 PM  Result Value Ref Range   Troponin I <0.03 <0.03 ng/mL    Comment: Performed at [facility], [address] [place], [place] [address]  Protime-INR     Status: None   Collection Time: 10/04/18  4:49 PM  Result Value Ref Range   Prothrombin Time 14.4 11.4 - 15.2 seconds   INR 1.13     Comment: Performed at [facility], [address] [place], [place] [address]  APTT     Status: None   Collection Time: 10/04/18  4:49 PM  Result Value Ref Range   aPTT 30 24 - 36 seconds    Comment: Performed at [facility], [address] [place], [place] [address]  Hemoglobin A1c     Status: None   Collection Time: 10/04/18  6:47 PM  Result Value Ref Range   Hgb A1c MFr Bld 5.1 4.8 - 5.6 %     Comment: (NOTE) Pre diabetes:          5.7%-6.4% Diabetes:              >6.4% Glycemic control for   <7.0% adults with diabetes    Mean Plasma Glucose 99.67 mg/dL    Comment: Performed at [facility] [address], [place], [place] [address]  Basic metabolic panel     Status: None   Collection Time: 10/05/18  5:06 AM  Result Value Ref Range   Sodium 137 135 - 145 mmol/L   Potassium 4.1 3.5 - 5.1 mmol/L   Chloride 99 98 - 111 mmol/L   CO2 29 22 - 32 mmol/L   Glucose, Bld 87 70 - 99 mg/dL   BUN 10 6 - 20 mg/dL   Creatinine, Ser 0.99 0.61 - 1.24 mg/dL   Calcium 8.9 8.9 - 10.3 mg/dL   GFR calc non Af Amer >60 >60 mL/min   GFR calc Af Amer >60 >60 mL/min    Comment: (  NOTE) The eGFR has been calculated using the CKD EPI equation. This calculation has not been validated in all clinical situations. eGFR's persistently <60 mL/min signify possible Chronic Kidney Disease.    Anion gap 9 5 - 15    Comment: Performed at [facility], [address] [place], [place] [address]  CBC     Status: Abnormal   Collection Time: 10/05/18  5:06 AM  Result Value Ref Range   WBC 5.3 4.0 - 10.5 K/uL   RBC 4.21 (L) 4.22 - 5.81 MIL/uL   Hemoglobin 12.6 (L) 13.0 - 17.0 g/dL   HCT 37.8 (L) 39.0 - 52.0 %   MCV 89.8 80.0 - 100.0 fL   MCH 29.9 26.0 - 34.0 pg   MCHC 33.3 30.0 - 36.0 g/dL   RDW 13.2 11.5 - 15.5 %   Platelets 174 150 - 400 K/uL   nRBC 0.0 0.0 - 0.2 %    Comment: Performed at [facility], [address] [place], [place] [address]  Lipid panel     Status: Abnormal   Collection Time: 10/05/18  5:06 AM  Result Value Ref Range   Cholesterol 211 (H) 0 - 200 mg/dL   Triglycerides 409 (H) <150 mg/dL   HDL 28 (L) >40 mg/dL   Total CHOL/HDL Ratio 7.5 RATIO   VLDL UNABLE TO CALCULATE IF TRIGLYCERIDE OVER 400 mg/dL 0 - 40 mg/dL   LDL Cholesterol UNABLE TO CALCULATE IF TRIGLYCERIDE OVER 400 mg/dL 0 - 99 mg/dL    Comment:        Total Cholesterol/HDL:CHD  Risk Coronary Heart Disease Risk Table                     Men   Women  1/2 Average Risk   3.4   3.3  Average Risk       5.0   4.4  2 X Average Risk   9.6   7.1  3 X Average Risk  23.4   11.0        Use the calculated Patient Ratio above and the CHD Risk Table to determine the patient's CHD Risk.        ATP III CLASSIFICATION (LDL):  <100     mg/dL   Optimal  100-129  mg/dL   Near or Above                    Optimal  130-159  mg/dL   Borderline  160-189  mg/dL   High  >190     mg/dL   Very High Performed at [facility], [address], [place] [address]   HIV Antibody (routine testing w rflx)     Status: None   Collection Time: 10/05/18  5:06 AM  Result Value Ref Range   HIV Screen 4th Generation wRfx Non Reactive Non Reactive    Comment: (NOTE) Performed At: [facility] [place], [place] [id number] Rush Farmer MD HC:[id number]   CK     Status: None   Collection Time: 10/06/18  7:49 AM  Result Value Ref Range   Total CK 77 49 - 397 U/L    Comment: Performed at [facility], [address] [place], [place] [address]  Basic metabolic panel     Status: None   Collection Time: 10/06/18  7:49 AM  Result Value Ref Range   Sodium 138 135 - 145 mmol/L   Potassium 4.4 3.5 - 5.1  mmol/L   Chloride 100 98 - 111 mmol/L   CO2 31 22 - 32 mmol/L   Glucose, Bld 95 70 - 99 mg/dL   BUN 10 6 - 20 mg/dL   Creatinine, Ser 1.02 0.61 - 1.24 mg/dL   Calcium 9.1 8.9 - 10.3 mg/dL   GFR calc non Af Amer >60 >60 mL/min   GFR calc Af Amer >60 >60 mL/min    Comment: (NOTE) The eGFR has been calculated using the CKD EPI equation. This calculation has not been validated in all clinical situations. eGFR's persistently <60 mL/min signify possible Chronic Kidney Disease.    Anion gap 7 5 - 15    Comment: Performed at [facility], [address] [place], [place] [address]    Current Facility-Administered Medications   Medication Dose Route Frequency Provider Last Rate Last Dose  . acetaminophen (TYLENOL) tablet 650 mg  650 mg Oral Q6H PRN Loletha Grayer, MD       Or  . acetaminophen (TYLENOL) suppository 650 mg  650 mg Rectal Q6H PRN Wieting, Richard, MD      . Acetylcysteine CAPS 1,800 mg  1,800 mg Oral QHS Wieting, Richard, MD      . ALPRAZolam Duanne Moron) tablet 0.5 mg  0.5 mg Oral TID Loletha Grayer, MD   0.5 mg at 10/06/18 0956  . amLODipine (NORVASC) tablet 5 mg  5 mg Oral BID Fritzi Mandes, MD   5 mg at 10/06/18 0957  . aspirin EC tablet 325 mg  325 mg Oral Daily Lance Coon, MD   325 mg at 10/06/18 0957  . atorvastatin (LIPITOR) tablet 40 mg  40 mg Oral q1800 Loletha Grayer, MD   40 mg at 10/05/18 1833  . CYSTADANE POWD 8 Scoop  8 Scoop Oral TID Loletha Grayer, MD   8 Scoop at 10/06/18 1005  . dicyclomine (BENTYL) capsule 10 mg  10 mg Oral Q6H Fritzi Mandes, MD   10 mg at 10/06/18 0603  . enoxaparin (LOVENOX) injection 40 mg  40 mg Subcutaneous Q24H Loletha Grayer, MD   40 mg at 10/05/18 2244  . folic acid (FOLVITE) tablet 1 mg  1,000 mcg Oral QHS Loletha Grayer, MD   1 mg at 10/05/18 2245  . furosemide (LASIX) tablet 20 mg  20 mg Oral Daily Fritzi Mandes, MD   20 mg at 10/06/18 0956  . gabapentin (NEURONTIN) tablet 1,200 mg  1,200 mg Oral BID Lance Coon, MD   1,200 mg at 10/06/18 0956  . LORazepam (ATIVAN) injection 0.5 mg  0.5 mg Intravenous Q1H PRN Loletha Grayer, MD      . losartan (COZAAR) tablet 25 mg  25 mg Oral Daily Fritzi Mandes, MD   25 mg at 10/06/18 0956  . metoprolol tartrate (LOPRESSOR) tablet 75 mg  75 mg Oral BID Fritzi Mandes, MD   75 mg at 10/06/18 0957  . ondansetron (ZOFRAN) tablet 4 mg  4 mg Oral Q6H PRN Loletha Grayer, MD       Or  . ondansetron [facility]) injection 4 mg  4 mg Intravenous Q6H PRN Wieting, Richard, MD      . pregabalin (LYRICA) capsule 50 mg  50 mg Oral BID Alexis Goodell, MD   50 mg at 10/06/18 0957  . pyridOXINE (VITAMIN B-6) tablet 500 mg  500 mg  Oral QHS Wieting, Richard, MD      . vitamin B-12 (CYANOCOBALAMIN) tablet 1,000 mcg  1,000 mcg Oral QHS Loletha Grayer, MD   1,000 mcg at 10/05/18  2244    Musculoskeletal: Strength & Muscle Tone: within normal limits Gait & Station: normal Patient leans: N/A  Psychiatric Specialty Exam: Physical Exam  ROS  Blood pressure 119/75, pulse 74, temperature 97.6 F (36.4 C), temperature source Oral, resp. rate 20, height '6\' 3"'$  (1.905 m), weight 127.9 kg, SpO2 96 %.Body mass index is 35.24 kg/m.  General Appearance: Casual  Eye Contact:  Good  Speech:  Clear and Coherent  Volume:  Normal  Mood:  Anxious and Euthymic  Affect:  Appropriate  Thought Process:  Goal Directed and Descriptions of Associations: Intact  Orientation:  Full (Time, Place, and Person)  Thought Content:  WDL  Suicidal Thoughts:  No  Homicidal Thoughts:  No  Memory:  Immediate;   Fair Recent;   Fair Remote;   Fair  Judgement:  Fair  Insight:  Fair  Psychomotor Activity:  Normal  Concentration:  Concentration: Fair and Attention Span: Fair  Recall:  [employer] of Knowledge:  Fair  Language:  Fair  Akathisia:  No  Handed:  Right  AIMS (if indicated):     Assets:  Communication Skills Desire for Improvement Financial Resources/Insurance Housing Resilience Social Support  ADL's:  Intact  Cognition:  WNL  Sleep:        Treatment Plan Summary: Daily contact with patient to assess and evaluate symptoms and progress in treatment and Medication management   PLAN: 1. We discontinued Zyprexa, Zoloft and Trazodone.  2. I will restart Zoloft 200 mg today because of history of severe OCD. He would benefit from additional day in the hospital.  3. We will avoid any other medications with serotonergic activity.  4. Due to sleep apnea, there is limited choice of sleeping aids. Refuses Seroquel.   5. If still here, I will follow up tomorrow.    Disposition: No evidence of imminent risk to self or others at  present.   Patient does not meet criteria for psychiatric inpatient admission. Supportive therapy provided about ongoing stressors. Discussed crisis plan, support from social network, calling 911, coming to the Emergency Department, and calling Suicide Hotline.  Orson Slick, MD 10/06/2018 11:42 AM

## 2018-10-07 DIAGNOSIS — F429 Obsessive-compulsive disorder, unspecified: Secondary | ICD-10-CM | POA: Diagnosis not present

## 2018-10-07 DIAGNOSIS — R479 Unspecified speech disturbances: Secondary | ICD-10-CM | POA: Diagnosis not present

## 2018-10-07 MED ORDER — PREGABALIN 50 MG PO CAPS: 50.0000 mg | ORAL_CAPSULE | Freq: Two times a day (BID) | ORAL | 0 refills | Status: DC

## 2018-10-07 MED ORDER — RAMELTEON 8 MG PO TABS: 8.0000 mg | ORAL_TABLET | Freq: Every day | ORAL | 0 refills | Status: DC

## 2018-10-07 NOTE — Discharge Summary (Signed)
[facility] [place] NAME: Jesus Crosby    MR#:  [id number]  DATE OF BIRTH:  02/26/1966  DATE OF ADMISSION:  10/04/2018 ADMITTING PHYSICIAN: Jesus Grayer, MD  DATE OF DISCHARGE: 10/07/2018  PRIMARY CARE PHYSICIAN: Jesus Freshwater, NP    ADMISSION DIAGNOSIS:  Aphasia [R47.01] Speech disturbance, unspecified type [R47.9]  DISCHARGE DIAGNOSIS:  Aphasia and blurred vision--resolved Bilateral LE RLS H/o Homocystinuria  SECONDARY DIAGNOSIS:   Past Medical History:  Diagnosis Date  . Anxiety   . Arthritis    knees,Right wrist  . Avascular necrosis ([place])   . Brain bleed ([place])   . Cataract    bilateral repair with lens implants  . Depression   . Family history of adverse reaction to anesthesia    n/v-mom  . GERD (gastroesophageal reflux disease)   . Homocystinuria ([place])   . Hypertension   . Idiopathic progressive polyneuropathy   . Lens disease   . Neuromuscular disorder ([place])   . Obesity, Class II, BMI 35-39.9   . OCD (obsessive compulsive disorder)   . Paresthesia 06/10/2015  . Sleep apnea   . Stroke [facility])    with brain bleed at age 52-no deficits  . Umbilical hernia     HOSPITAL COURSE:   [name] a52 y.o.malewith a known history of homocystinuria and stroke at age 52 presents with blurred vision going on for over the last week. Also having worsening slurred speech and trouble putting his words together.  1. Aphasia and blurred vision--Ruled out STROKEPatient states this is similar to the stroke symptoms that he has had in the past.  - MRI of the brain and an MRA of the brain negative - Echocardiogram with bubble study pending-- within normal limits - carotid ultrasound no stenosis - PT, OT and neurology consultations. Appreciated - Continue aspirin full dose at this point and increase Lipitor to 40 mg daily and check a lipid profile. -EEG negative for epileptiform activity -on lyrica--per  neurology rec  2. Homocystinuria. Continue the patient's NAC , B12, Cystadane and B6 and folic acid supplementation -he has a neurologist who he follows for last 24 years at [facility]. Patient asked to keep that appointment.  3. Insomnia and severe depression continue his psychiatric medications -by Dr. Bary Crosby. Patient resume back on Zoloft - Trazodone on hold -Remeron started  4. History of hypertension.  Resume all antihypertensives at this time.   5. Hyperlipidemia unspecified check a lipid profile in the morning increase Lipitor to 40 mg  6. Obesity and sleep apnea Oxygen at bedtime  7.History of avascular necrosis  D/c home today Overall improving CONSULTS OBTAINED:  Treatment Team:  Jesus Goodell, MD Jesus Crosby, Jesus Honour, MD  DRUG ALLERGIES:   Allergies  Allergen Reactions  . Pineapple Flavor Swelling    Lip swell  . Pineapple Swelling    Lips swelling  . Amoxicillin Nausea And Vomiting    REACTION: Nausea and vomiting  . Penicillins     DISCHARGE MEDICATIONS:   Allergies as of 10/07/2018      Reactions   Pineapple Flavor Swelling   Lip swell   Pineapple Swelling   Lips swelling   Amoxicillin Nausea And Vomiting   REACTION: Nausea and vomiting   Penicillins       Medication List    STOP taking these medications   OLANZapine 10 MG tablet Commonly known as:  ZYPREXA   traZODone 100 MG tablet Commonly known as:  DESYREL  TAKE these medications   ALPRAZolam 0.5 MG tablet Commonly known as:  XANAX Take 0.5 mg by mouth 3 (three) times daily.   amLODipine 5 MG tablet Commonly known as:  NORVASC TAKE 1 TABLET(5 MG) BY MOUTH TWICE DAILY What changed:  See the new instructions.   aspirin 81 MG chewable tablet Chew 81 mg by mouth daily.   atorvastatin 10 MG tablet Commonly known as:  LIPITOR Take 1 tablet (10 mg total) by mouth daily.   cycloSPORINE 0.05 % ophthalmic emulsion Commonly known as:  RESTASIS Place 1 drop  into both eyes 2 (two) times daily.   CYSTADANE Powd Take 8 Scoops by mouth 3 (three) times daily.   dicyclomine 10 MG capsule Commonly known as:  BENTYL Take 1 capsule (10 mg total) by mouth every 6 (six) hours as needed (abdominal pain, cramping or diarrhea).   folic acid 161 MCG tablet Commonly known as:  FOLVITE Take 1,200 mcg by mouth at bedtime.   furosemide 20 MG tablet Commonly known as:  LASIX Take 1 tablet (20 mg total) by mouth every morning.   gabapentin 600 MG tablet Commonly known as:  NEURONTIN Take 1,200 mg by mouth 2 (two) times daily.   losartan 50 MG tablet Commonly known as:  COZAAR Take half tab po qd What changed:  Another medication with the same name was removed. Continue taking this medication, and follow the directions you see here.   metoprolol tartrate 50 MG tablet Commonly known as:  LOPRESSOR Take 1.5 tablets (75 mg total) by mouth 2 (two) times daily.   NAC 600 MG Caps Generic drug:  Acetylcysteine Take 1,800 mg by mouth at bedtime.   NONFORMULARY OR COMPOUNDED ITEM See pharmacy note   omeprazole 20 MG capsule Commonly known as:  PRILOSEC Take 20 mg by mouth as needed.   pregabalin 50 MG capsule Commonly known as:  LYRICA Take 1 capsule (50 mg total) by mouth 2 (two) times daily.   pyridOXINE 100 MG tablet Commonly known as:  VITAMIN B-6 Take 500 mg by mouth at bedtime.   ramelteon 8 MG tablet Commonly known as:  ROZEREM Take 1 tablet (8 mg total) by mouth at bedtime.   sertraline 100 MG tablet Commonly known as:  ZOLOFT Take 200 mg by mouth daily.   VITAMIN B 12 PO Take 1,000 mcg by mouth at bedtime.       If you experience worsening of your admission symptoms, develop shortness of breath, life threatening emergency, suicidal or homicidal thoughts you must seek medical attention immediately by calling 911 or calling your MD immediately  if symptoms less severe.  You Must read complete instructions/literature along with  all the possible adverse reactions/side effects for all the Medicines you take and that have been prescribed to you. Take any new Medicines after you have completely understood and accept all the possible adverse reactions/side effects.   Please note  You were cared for by a hospitalist during your hospital stay. If you have any questions about your discharge medications or the care you received while you were in the hospital after you are discharged, you can call the unit and asked to speak with the hospitalist on call if the hospitalist that took care of you is not available. Once you are discharged, your primary care physician will handle any further medical issues. Please note that NO REFILLS for any discharge medications will be authorized once you are discharged, as it is imperative that you return to your  primary care physician (or establish a relationship with a primary care physician if you do not have one) for your aftercare needs so that they can reassess your need for medications and monitor your lab values. Today   SUBJECTIVE   Having diarrhea. Has IBS  VITAL SIGNS:  Blood pressure 132/90, pulse 70, temperature 97.8 F (36.6 C), temperature source Oral, resp. rate 16, height 6\' 3"  (1.905 m), weight 127.9 kg, SpO2 96 %.  I/O:    Intake/Output Summary (Last 24 hours) at 10/07/2018 1214 Last data filed at 10/06/2018 1418 Gross per 24 hour  Intake 240 ml  Output -  Net 240 ml    PHYSICAL EXAMINATION:  GENERAL:  52 y.o.-year-old patient lying in the bed with no acute distress. obese EYES: Pupils equal, round, reactive to light and accommodation. No scleral icterus. Extraocular muscles intact.  HEENT: Head atraumatic, normocephalic. Oropharynx and nasopharynx clear.  NECK:  Supple, no jugular venous distention. No thyroid enlargement, no tenderness.  LUNGS: Normal breath sounds bilaterally, no wheezing, rales,rhonchi or crepitation. No use of accessory muscles of respiration.   CARDIOVASCULAR: S1, S2 normal. No murmurs, rubs, or gallops.  ABDOMEN: Soft, non-tender, non-distended. Bowel sounds present. No organomegaly or mass.  EXTREMITIES: No pedal edema, cyanosis, or clubbing.  NEUROLOGIC: Cranial nerves II through XII are intact. Muscle strength 5/5 in all extremities. Sensation intact. Gait not checked.  PSYCHIATRIC: The patient is alert and oriented x 3.  SKIN: No obvious rash, lesion, or ulcer.   DATA REVIEW:   CBC  Recent Labs  Lab 10/05/18 0506  WBC 5.3  HGB 12.6*  HCT 37.8*  PLT 174    Chemistries  Recent Labs  Lab 10/04/18 1618  10/06/18 0749  NA 133*   < > 138  K 4.4   < > 4.4  CL 92*   < > 100  CO2 29   < > 31  GLUCOSE 83   < > 95  BUN 10   < > 10  CREATININE 1.10   < > 1.02  CALCIUM 9.5   < > 9.1  AST 57*  --   --   ALT 56*  --   --   ALKPHOS 54  --   --   BILITOT 0.9  --   --    < > = values in this interval not displayed.    Microbiology Results   No results found for this or any previous visit (from the past 240 hour(s)).  RADIOLOGY:  No results found.   Management plans discussed with the patient, family and they are in agreement.  CODE STATUS:     Code Status Orders  (From admission, onward)         Start     Ordered   10/04/18 1847  Full code  Continuous     10/04/18 1847        Code Status History    Date Active Date Inactive Code Status Order ID Comments User Context   08/15/2014 0217 08/16/2014 2011 Full Code [id number]  Rise Patience, MD Inpatient      TOTAL TIME TAKING CARE OF THIS PATIENT: 40 minutes.    Fritzi Mandes M.D on 10/07/2018 at 12:14 PM  Between 7am to 6pm - Pager - [phone] After 6pm go to [email or web address] - password [email or web address] [facility]  Office  [phone]  CC: Primary care physician; Jesus Freshwater, NP

## 2018-10-07 NOTE — Progress Notes (Signed)
Subjective: LE tremors improved.  Patient tolerating Lyrica.  Speech improved as well.    Objective: Current vital signs: BP 132/90   Pulse 70   Temp 97.8 F (36.6 C) (Oral)   Resp 16   Ht 6\' 3"  (1.905 m)   Wt 127.9 kg   SpO2 96%   BMI 35.24 kg/m  Vital signs in last 24 hours: Temp:  [97.8 F (36.6 C)-98.1 F (36.7 C)] 97.8 F (36.6 C) (11/03 0523) Pulse Rate:  [67-82] 70 (11/03 0807) Resp:  [16-20] 16 (11/03 0523) BP: (112-135)/(72-90) 132/90 (11/03 0807) SpO2:  [94 %-96 %] 96 % (11/03 0523)  Intake/Output from previous day: 11/02 0701 - 11/03 0700 In: 360 [P.O.:360] Out: -  Intake/Output this shift: No intake/output data recorded. Nutritional status:  Diet Order            Diet - low sodium heart healthy        Diet 2 gram sodium Room service appropriate? Yes; Fluid consistency: Thin  Diet effective now              Neurologic Exam: Mental Status: Alert, oriented, thought content appropriate. Speech fluent without evidence of aphasia.Able to follow 3 step commands without difficulty.  Cranial Nerves: II: Discs flat bilaterally; Visual fields grossly normal, pupils equal, round, reactive to light and accommodation III,IV, VI: ptosis not present, extra-ocular motions intact bilaterally V,VII: smile symmetric, facial light touch sensationintact VIII: hearing normal bilaterally IX,X: gag reflex present XI: bilateral shoulder shrug XII: midline tongue extension Motor: Right :Upper extremity 5/5Without pronator driftLeft: Upper extremity 5/5 without pronator drift Able to lift lower extremities off the bed but does not maintain   Lab Results: Basic Metabolic Panel: Recent Labs  Lab 10/04/18 1618 10/05/18 0506 10/06/18 0749  NA 133* 137 138  K 4.4 4.1 4.4  CL 92* 99 100  CO2 29 29 31   GLUCOSE 83 87 95  BUN 10 10 10   CREATININE 1.10 0.99 1.02  CALCIUM 9.5 8.9 9.1    Liver Function Tests: Recent Labs  Lab 10/04/18 1618  AST  57*  ALT 56*  ALKPHOS 54  BILITOT 0.9  PROT 9.0*  ALBUMIN 4.7   No results for input(s): LIPASE, AMYLASE in the last 168 hours. No results for input(s): AMMONIA in the last 168 hours.  CBC: Recent Labs  Lab 10/04/18 1618 10/05/18 0506  WBC 7.5 5.3  NEUTROABS 3.8  --   HGB 14.4 12.6*  HCT 42.8 37.8*  MCV 89.4 89.8  PLT 210 174    Cardiac Enzymes: Recent Labs  Lab 10/04/18 1618 10/06/18 0749  CKTOTAL  --  77  TROPONINI <0.03  --     Lipid Panel: Recent Labs  Lab 10/05/18 0506  CHOL 211*  TRIG 409*  HDL 28*  CHOLHDL 7.5  VLDL UNABLE TO CALCULATE IF TRIGLYCERIDE OVER 400 mg/dL  LDLCALC UNABLE TO CALCULATE IF TRIGLYCERIDE OVER 400 mg/dL    CBG: Recent Labs  Lab 10/04/18 [place] 80    Microbiology: No results found for this or any previous visit.  Coagulation Studies: Recent Labs    10/04/18 1649  LABPROT 14.4  INR 1.13    Imaging: No results found.  Medications:  I have reviewed the patient's current medications. Scheduled: Marland Kitchen Acetylcysteine  1,800 mg Oral QHS  . ALPRAZolam  0.5 mg Oral TID  . amLODipine  5 mg Oral BID  . aspirin EC  325 mg Oral Daily  . atorvastatin  40 mg Oral  q1800  . CYSTADANE  8 Scoop Oral TID  . dicyclomine  10 mg Oral Q6H  . enoxaparin (LOVENOX) injection  40 mg Subcutaneous Q24H  . folic acid  7,494 mcg Oral QHS  . furosemide  20 mg Oral Daily  . gabapentin  1,200 mg Oral BID  . losartan  25 mg Oral Daily  . mouth rinse  15 mL Mouth Rinse BID  . metoprolol tartrate  75 mg Oral BID  . pregabalin  50 mg Oral BID  . pyridOXINE  500 mg Oral QHS  . ramelteon  8 mg Oral QHS  . sertraline  200 mg Oral Daily  . sodium chloride flush  3 mL Intravenous Q12H  . vitamin B-12  1,000 mcg Oral QHS    Assessment/Plan: Patient to continue Lyrica at 50mg  BID.  May eventually be able to taper completely off but will need follow up with neurology for them to direct this.  Patient wishes to follow up at [facility].   LOS: 1 day    Alexis Goodell, MD Neurology [phone] 10/07/2018  1:04 PM

## 2018-10-07 NOTE — Progress Notes (Signed)
cpap refused 

## 2018-10-07 NOTE — Progress Notes (Signed)
[facility] MD Progress Note  10/07/2018 11:39 AM Jesus Crosby.  MRN:  [id number]  Subjective:  Follow up on a consult done 11/2. Jesus Crosby denies any ;problems today.   We held Zoloft 200 mg and Trazodone 200 mg nightly for one night as there were concerns about possibility of serotonin syndrome. We restarted Zoloft last night necessary for severe OCD. We discontinued Trazodone and gave Rozarem 8 mg.   Patient reports two more episodes on involuntary leg movement last evening not serious enough to alert his nurse. Slept 5 hours.   Principal Problem: OCD (obsessive compulsive disorder) Diagnosis:   Patient Active Problem List   Diagnosis Date Noted  . OCD (obsessive compulsive disorder) [F42.9]     Priority: High  . Aphasia [R47.01] 10/04/2018  . Arthritis [M19.90] 04/26/2018  . Arthritis of knee [M17.10] 04/26/2018  . Bilateral cataracts [H26.9] 04/26/2018  . Class 1 obesity [E66.9] 04/26/2018  . Disorder of lens [H27.9] 04/26/2018  . Exomphalos [Q79.2] 04/26/2018  . OSA on CPAP [G47.33, Z99.89] 04/26/2018  . Prediabetes [R73.03] 04/26/2018  . History of total hip arthroplasty [Z96.649] 07/05/2017  . Spinal stenosis of lumbar region [M48.061] 04/25/2017  . Trochanteric bursitis [M70.60] 02/27/2017  . S/P laparoscopic sleeve gastrectomy [Z98.84] 08/17/2016  . Alcohol-induced mood disorder ([place]) [F10.94]   . Paresthesia [R20.2] 06/10/2015  . Diplopia [H53.2] 08/15/2014  . Sinus tachycardia [R00.0] 08/15/2014  . CVA (cerebral infarction) [I63.9] 08/14/2014  . Neuropathy [G62.9] 04/04/2014  . Intraocular lens dislocation [T85.22XA] 09/12/2013  . DDD (degenerative disc disease), lumbosacral [M51.37] 07/04/2013  . HTN (hypertension) [I10] 07/04/2013  . Major depressive disorder, single episode [F32.9] 07/03/2009  . 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 HYPERLIPIDEMIA [E78.5] 05/28/2008  . ANXIETY [F41.1] 05/28/2008  . OBSESSIVE-COMPULSIVE  DISORDER [F42.9] 05/28/2008   Total Time spent with patient: 15 minutes  Past Psychiatric History: OVD  Past Medical History:  Past Medical History:  Diagnosis Date  . Anxiety   . Arthritis    knees,Right wrist  . Avascular necrosis ([place])   . Brain bleed ([place])   . Cataract    bilateral repair with lens implants  . Depression   . Family history of adverse reaction to anesthesia    n/v-mom  . GERD (gastroesophageal reflux disease)   . 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 Hypertension   . Idiopathic progressive polyneuropathy   . Lens disease   . Neuromuscular disorder ([place])   . Obesity, Class II, BMI 35-39.9   . OCD (obsessive compulsive disorder)   . Paresthesia 06/10/2015  . Sleep apnea   . Stroke [facility])    with brain bleed at age 31-no deficits  . Umbilical hernia     Past Surgical History:  Procedure Laterality Date  . COLONOSCOPY WITH PROPOFOL N/A 07/30/2015   Procedure: COLONOSCOPY WITH PROPOFOL;  Surgeon: [name], MD;  Location: [facility];  Service: Endoscopy;  Laterality: N/A;  . DERMABRASION OF FACE     due to acne scars  . distal radius fracture of right hand (plates, screws, and pins)  2011  . ESOPHAGOGASTRODUODENOSCOPY (EGD) WITH PROPOFOL N/A 07/30/2015   Procedure: ESOPHAGOGASTRODUODENOSCOPY (EGD) WITH PROPOFOL;  Surgeon: [name], MD;  Location: [facility];  Service: Endoscopy;  Laterality: N/A;  . EYE SURGERY     LASER + SURG BIL   . GAS/FLUID EXCHANGE Right 09/17/2013   Procedure: GAS/FLUID EXCHANGE;  Surgeon: Hayden Pedro, MD;  Location: [place];  Service: Ophthalmology;  Laterality:  Right;  . gastric sleeve surgery  08/17/2016  . HIP ARTHROPLASTY Left   . INTRAOCULAR LENS INSERTION Bilateral    lens disease due to homocysteinuria  . NASAL SEPTUM SURGERY  march 2016   @ [facility]  . PARS PLANA VITRECTOMY Right 09/17/2013   Procedure: PARS PLANA VITRECTOMY WITH 25G REMOVAL/SUTURE SECONDARY INTRAOCULAR LENS;  Surgeon: Hayden Pedro, MD;  Location: [place];  Service: Ophthalmology;  Laterality: Right;  . PHOTOCOAGULATION Right 09/17/2013   Procedure: PHOTOCOAGULATION;  Surgeon: Hayden Pedro, MD;  Location: [place];  Service: Ophthalmology;  Laterality: Right;  HEADSCOPE LASER  . scapholunate ligament reconstruction of right hand  2011  . UMBILICAL HERNIA REPAIR N/A 05/07/2015   Procedure: HERNIA REPAIR UMBILICAL ADULT;  Surgeon: Florene Glen, MD;  Location: [facility];  Service: General;  Laterality: N/A;  . WRIST SURGERY Right    Family History:  Family History  Problem Relation Age of Onset  . Breast cancer Mother   . Hypertension Mother   . Hypertension Father   . Stroke Maternal Grandmother   . Stroke Maternal Grandfather   . Stroke Paternal Grandmother   . Stroke Paternal Grandfather   . Stroke Other    Family Psychiatric  History: none Social History:  Social History   Substance and Sexual Activity  Alcohol Use No  . Alcohol/week: 0.0 standard drinks     Social History   Substance and Sexual Activity  Drug Use No    Social History   Socioeconomic History  . Marital status: Single    Spouse name: n/a  . Number of children: 0  . Years of education: college  . Highest education level: Not on file  Occupational History  . Occupation: unemployed/disabled    Comment: Lexicographer  Social Needs  . Financial resource strain: Not hard at all  . Food insecurity:    Worry: Never true    Inability: Never true  . Transportation needs:    Medical: Yes    Non-medical: Yes  Tobacco Use  . Smoking status: Never Smoker  . Smokeless tobacco: Never Used  Substance and Sexual Activity  . Alcohol use: No    Alcohol/week: 0.0 standard drinks  . Drug use: No  . Sexual activity: Not on file  Lifestyle  . Physical activity:    Days per week: 0 days    Minutes per session: 0 min  . Stress: Not at all  Relationships  . Social connections:    Talks on phone: Once a week    Gets together: Once a week    Attends  religious service: Never    Active member of club or organization: No    Attends meetings of clubs or organizations: Never    Relationship status: Never married  Other Topics Concern  . Not on file  Social History Narrative   Lives alone. Parents live nearby.  Applying for disability (OCD) due to wrist injury, hearing upcoming.      Patient drinks 4-5 cups of caffeine daily.   Patient is right handed.   Additional Social History:                         Sleep: Fair  Appetite:  Fair  Current Medications: Current Facility-Administered Medications  Medication Dose Route Frequency Provider Last Rate Last Dose  . acetaminophen (TYLENOL) tablet 650 mg  650 mg Oral Q6H PRN Loletha Grayer, MD       Or  .  acetaminophen (TYLENOL) suppository 650 mg  650 mg Rectal Q6H PRN Loletha Grayer, MD      . Acetylcysteine CAPS 1,800 mg  1,800 mg Oral QHS Wieting, Richard, MD      . ALPRAZolam Duanne Moron) tablet 0.5 mg  0.5 mg Oral TID Loletha Grayer, MD   0.5 mg at 10/07/18 0808  . amLODipine (NORVASC) tablet 5 mg  5 mg Oral BID Fritzi Mandes, MD   5 mg at 10/07/18 2951  . aspirin EC tablet 325 mg  325 mg Oral Daily Lance Coon, MD   325 mg at 10/07/18 8841  . atorvastatin (LIPITOR) tablet 40 mg  40 mg Oral q1800 Loletha Grayer, MD   40 mg at 10/06/18 1755  . CYSTADANE POWD 8 Scoop  8 Scoop Oral TID Loletha Grayer, MD   8 Scoop at 10/07/18 0809  . dicyclomine (BENTYL) capsule 10 mg  10 mg Oral Q6H Fritzi Mandes, MD   10 mg at 10/07/18 0603  . enoxaparin (LOVENOX) injection 40 mg  40 mg Subcutaneous Q24H Loletha Grayer, MD   40 mg at 10/06/18 2206  . folic acid (FOLVITE) tablet 1 mg  1,000 mcg Oral QHS Loletha Grayer, MD   1 mg at 10/06/18 2205  . furosemide (LASIX) tablet 20 mg  20 mg Oral Daily Fritzi Mandes, MD   20 mg at 10/07/18 6606  . gabapentin (NEURONTIN) tablet 1,200 mg  1,200 mg Oral BID Lance Coon, MD   1,200 mg at 10/07/18 3016  . losartan (COZAAR) tablet 25 mg  25 mg  Oral Daily Fritzi Mandes, MD   25 mg at 10/07/18 0109  . MEDLINE mouth rinse  15 mL Mouth Rinse BID Fritzi Mandes, MD   15 mL at 10/07/18 0810  . metoprolol tartrate (LOPRESSOR) tablet 75 mg  75 mg Oral BID Fritzi Mandes, MD   75 mg at 10/07/18 3235  . ondansetron (ZOFRAN) tablet 4 mg  4 mg Oral Q6H PRN Loletha Grayer, MD   4 mg at 10/07/18 0843   Or  . ondansetron (ZOFRAN) injection 4 mg  4 mg Intravenous Q6H PRN Wieting, Richard, MD      . pregabalin (LYRICA) capsule 50 mg  50 mg Oral BID Alexis Goodell, MD   50 mg at 10/07/18 0808  . pyridOXINE (VITAMIN B-6) tablet 500 mg  500 mg Oral QHS Wieting, Richard, MD      . ramelteon (ROZEREM) tablet 8 mg  8 mg Oral QHS Pucilowska, Jolanta B, MD   8 mg at 10/06/18 2206  . sertraline (ZOLOFT) tablet 200 mg  200 mg Oral Daily Pucilowska, Jolanta B, MD   200 mg at 10/07/18 0808  . sodium chloride flush (NS) 0.9 % injection 3 mL  3 mL Intravenous Q12H Dustin Flock, MD   3 mL at 10/07/18 0809  . vitamin B-12 (CYANOCOBALAMIN) tablet 1,000 mcg  1,000 mcg Oral QHS Loletha Grayer, MD   1,000 mcg at 10/06/18 2205    Lab Results:  Results for orders placed or performed during the hospital encounter of 10/04/18 (from the past 48 hour(s))  CK     Status: None   Collection Time: 10/06/18  7:49 AM  Result Value Ref Range   Total CK 77 49 - 397 U/L    Comment: Performed at [facility], [address], [place], [place] [address]  Basic metabolic panel     Status: None   Collection Time: 10/06/18  7:49 AM  Result Value Ref Range  Sodium 138 135 - 145 mmol/L   Potassium 4.4 3.5 - 5.1 mmol/L   Chloride 100 98 - 111 mmol/L   CO2 31 22 - 32 mmol/L   Glucose, Bld 95 70 - 99 mg/dL   BUN 10 6 - 20 mg/dL   Creatinine, Ser 1.02 0.61 - 1.24 mg/dL   Calcium 9.1 8.9 - 10.3 mg/dL   GFR calc non Af Amer >60 >60 mL/min   GFR calc Af Amer >60 >60 mL/min    Comment: (NOTE) The eGFR has been calculated using the CKD EPI equation. This calculation has not  been validated in all clinical situations. eGFR's persistently <60 mL/min signify possible Chronic Kidney Disease.    Anion gap 7 5 - 15    Comment: Performed at [facility], [address] [place],  [address]    Blood Alcohol level:  Lab Results  Component Value Date   ETH 177 (H) 04/29/2016   ETH <11 78/58/8502    Metabolic Disorder Labs: Lab Results  Component Value Date   HGBA1C 5.1 10/04/2018   MPG 99.67 10/04/2018   MPG 111 08/15/2014   No results found for: PROLACTIN Lab Results  Component Value Date   CHOL 211 (H) 10/05/2018   TRIG 409 (H) 10/05/2018   HDL 28 (L) 10/05/2018   CHOLHDL 7.5 10/05/2018   VLDL UNABLE TO CALCULATE IF TRIGLYCERIDE OVER 400 mg/dL 10/05/2018   LDLCALC UNABLE TO CALCULATE IF TRIGLYCERIDE OVER 400 mg/dL 10/05/2018   LDLCALC 133 (H) 08/29/2018    Physical Findings: AIMS:  , ,  ,  ,    CIWA:    COWS:     Musculoskeletal: Strength & Muscle Tone: within normal limits Gait & Station: unsteady Patient leans: N/A  Psychiatric Specialty Exam: Physical Exam  Nursing note and vitals reviewed. Psychiatric: He has a normal mood and affect. His speech is normal and behavior is normal. Judgment normal. Cognition and memory are normal.    Review of Systems  Musculoskeletal: Positive for joint pain.  Neurological: Negative.   Psychiatric/Behavioral: The patient is nervous/anxious.   All other systems reviewed and are negative.   Blood pressure 132/90, pulse 70, temperature 97.8 F (36.6 C), temperature source Oral, resp. rate 16, height '6\' 3"'$  (1.905 m), weight 127.9 kg, SpO2 96 %.Body mass index is 35.24 kg/m.  General Appearance: Casual  Eye Contact:  Good  Speech:  Clear and Coherent  Volume:  Normal  Mood:  Euthymic  Affect:  Appropriate  Thought Process:  Goal Directed and Descriptions of Associations: Intact  Orientation:  Full (Time, Place, and Person)  Thought Content:  WDL  Suicidal Thoughts:  No  Homicidal  Thoughts:  No  Memory:  Immediate;   Fair Recent;   Fair Remote;   Fair  Judgement:  Fair  Insight:  Shallow  Psychomotor Activity:  Decreased  Concentration:  Concentration: Fair and Attention Span: Fair  Recall:  [employer] of Knowledge:  Fair  Language:  Fair  Akathisia:  No  Handed:  Right  AIMS (if indicated):     Assets:  Communication Skills Desire for Improvement Financial Resources/Insurance Housing Resilience Social Support  ADL's:  Intact  Cognition:  WNL  Sleep:        Treatment Plan Summary: Daily contact with patient to assess and evaluate symptoms and progress in treatment and Medication management   PLAN: Patient does not meet criteria for IVC. Please discharge as appropriate.  Continue Zoloft 200 mg nightly, Rozerem 8  mg nightly. He may consider new sleeping aid, Suvorexant, which he discussed with Dr. Reece Levy his primary psychiatrist already.  Case discussed with Dr. Posey Pronto.  Orson Slick, MD 10/07/2018, 11:39 AM

## 2018-10-07 NOTE — Progress Notes (Signed)
No tremors or restless legs observed all morning. Zofran effective for nausea while actively eating breakfast. Reported 1 diarrhea stool with MD notified with Dad bringing pt " imodium from home" and gave it to him- education done with pt and dad in regards to Medication safety/rules/laws/consequence/harmful effects. Case manager, Merrily Pew, has contacted pt/family with referral in place. Ready for discharge home to home/self care with [facility]

## 2018-10-16 ENCOUNTER — Telehealth: Payer: Self-pay

## 2018-10-16 ENCOUNTER — Telehealth: Payer: Self-pay | Admitting: Physician Assistant

## 2018-10-16 NOTE — Telephone Encounter (Signed)
Patient feels there is a definite connection between his Cystadane and his diarrhea. Bentyl helps to create normal to "constipated" stools. He starts his day feeling he is constipated. He takes Cystadane and then has "water" bowel movements.  He will try taking Bentyl closer to the time he has to take the powder to see if it gives him more control.

## 2018-10-16 NOTE — Telephone Encounter (Signed)
Gave verbal order to [facility] medication management ,Physical therapy,OT speech therapy and pt had appt tomorrow with adam and advised pt to bring all his med

## 2018-10-17 ENCOUNTER — Encounter: Payer: Self-pay | Admitting: Adult Health

## 2018-10-17 ENCOUNTER — Ambulatory Visit (INDEPENDENT_AMBULATORY_CARE_PROVIDER_SITE_OTHER): Payer: Medicare HMO | Admitting: Adult Health

## 2018-10-17 VITALS — BP 140/80 | HR 80 | Resp 16 | Ht 75.0 in | Wt 276.0 lb

## 2018-10-17 DIAGNOSIS — I10 Essential (primary) hypertension: Secondary | ICD-10-CM

## 2018-10-17 DIAGNOSIS — G629 Polyneuropathy, unspecified: Secondary | ICD-10-CM

## 2018-10-17 DIAGNOSIS — G4700 Insomnia, unspecified: Secondary | ICD-10-CM

## 2018-10-17 DIAGNOSIS — K521 Toxic gastroenteritis and colitis: Secondary | ICD-10-CM | POA: Diagnosis not present

## 2018-10-17 DIAGNOSIS — F411 Generalized anxiety disorder: Secondary | ICD-10-CM

## 2018-10-17 DIAGNOSIS — F339 Major depressive disorder, recurrent, unspecified: Secondary | ICD-10-CM

## 2018-10-17 MED ORDER — PREGABALIN 25 MG PO CAPS: 25.0000 mg | ORAL_CAPSULE | Freq: Two times a day (BID) | ORAL | 0 refills | Status: DC

## 2018-10-17 MED ORDER — RAMELTEON 8 MG PO TABS: 8.0000 mg | ORAL_TABLET | Freq: Every day | ORAL | 0 refills | Status: DC

## 2018-10-17 NOTE — Patient Instructions (Signed)

## 2018-10-17 NOTE — Progress Notes (Signed)
[facility] [place], [place] [address]  Internal MEDICINE  Office Visit Note  Patient Name: Jesus Crosby  [id number]  [id number]  Date of Service: 10/17/2018  Chief Complaint  Patient presents with  . Anxiety  . Depression  . Hypertension  . Hospitalization Follow-up    HPI  Patient is here for hospital follow-up.  He was admitted on October 31 and discharged on November 3.  He was treated for aphasia and blurred vision that resolved while hospitalized.  He also had bilateral lower extremity restless leg syndrome.  The hospital physicians determined that the patient's Lyrica was stopped to aggressively and likely was having this nerve reaction based on that.  They reinstated his Lyrica and sent him to [place] his PCP to taper off his Lyrica.  He is currently taking 50 g twice daily.  His anxiety and depression are fairly controlled at this time.  His blood pressure appears stable on current medication regimen.   Current Medication: Outpatient Encounter Medications as of 10/17/2018  Medication Sig Note  . Alpha-Lipoic Acid 200 MG CAPS Take by mouth. Take 3 cap daily   . ALPRAZolam (XANAX) 0.5 MG tablet Take 0.5 mg by mouth 3 (three) times daily. 03/25/2014: .   Marland Kitchen amLODipine (NORVASC) 5 MG tablet TAKE 1 TABLET(5 MG) BY MOUTH TWICE DAILY (Patient taking differently: Take 5 mg by mouth 2 (two) times daily. )   . aspirin EC 81 MG tablet Take 81 mg by mouth daily.   Marland Kitchen atorvastatin (LIPITOR) 10 MG tablet Take 1 tablet (10 mg total) by mouth daily.   . Betaine (CYSTADANE) POWD Take 8 Scoops by mouth 3 (three) times daily.   . Cyanocobalamin (VITAMIN B 12 PO) Take 1,000 mcg by mouth at bedtime.    . cycloSPORINE (RESTASIS) 0.05 % ophthalmic emulsion Place 1 drop into both eyes 2 (two) times daily.   Marland Kitchen dicyclomine (BENTYL) 10 MG capsule Take 1 capsule (10 mg total) by mouth every 6 (six) hours as needed (abdominal pain, cramping or diarrhea).   . folic acid (FOLVITE)  734 MCG tablet Take 1,200 mcg by mouth at bedtime.    . furosemide (LASIX) 20 MG tablet Take 1 tablet (20 mg total) by mouth every morning.   . gabapentin (NEURONTIN) 600 MG tablet Take 1,200 mg by mouth 2 (two) times daily.    Marland Kitchen losartan (COZAAR) 50 MG tablet Take half tab po qd   . metoprolol tartrate (LOPRESSOR) 50 MG tablet Take 1.5 tablets (75 mg total) by mouth 2 (two) times daily.   Marland Kitchen pyridOXINE (VITAMIN B-6) 100 MG tablet Take 500 mg by mouth at bedtime.    . ramelteon (ROZEREM) 8 MG tablet Take 1 tablet (8 mg total) by mouth at bedtime.   . sertraline (ZOLOFT) 100 MG tablet Take 200 mg by mouth daily.    . [DISCONTINUED] pregabalin (LYRICA) 50 MG capsule Take 1 capsule (50 mg total) by mouth 2 (two) times daily.   . [DISCONTINUED] ramelteon (ROZEREM) 8 MG tablet Take 1 tablet (8 mg total) by mouth at bedtime.   . pregabalin (LYRICA) 25 MG capsule Take 1 capsule (25 mg total) by mouth 2 (two) times daily.   . [DISCONTINUED] Acetylcysteine (NAC) 600 MG CAPS Take 1,800 mg by mouth at bedtime.    . [DISCONTINUED] aspirin 81 MG chewable tablet Chew 81 mg by mouth daily.    . [DISCONTINUED] NONFORMULARY OR COMPOUNDED ITEM See pharmacy note (Patient not taking: Reported on  10/17/2018)   . [DISCONTINUED] omeprazole (PRILOSEC) 20 MG capsule Take 20 mg by mouth as needed.     No facility-administered encounter medications on file as of 10/17/2018.     Surgical History: Past Surgical History:  Procedure Laterality Date  . COLONOSCOPY WITH PROPOFOL N/A 07/30/2015   Procedure: COLONOSCOPY WITH PROPOFOL;  Surgeon: [name], MD;  Location: [facility];  Service: Endoscopy;  Laterality: N/A;  . DERMABRASION OF FACE     due to acne scars  . distal radius fracture of right hand (plates, screws, and pins)  2011  . ESOPHAGOGASTRODUODENOSCOPY (EGD) WITH PROPOFOL N/A 07/30/2015   Procedure: ESOPHAGOGASTRODUODENOSCOPY (EGD) WITH PROPOFOL;  Surgeon: [name], MD;  Location: [facility];   Service: Endoscopy;  Laterality: N/A;  . EYE SURGERY     LASER + SURG BIL   . GAS/FLUID EXCHANGE Right 09/17/2013   Procedure: GAS/FLUID EXCHANGE;  Surgeon: Hayden Pedro, MD;  Location: [place];  Service: Ophthalmology;  Laterality: Right;  . gastric sleeve surgery  08/17/2016  . HIP ARTHROPLASTY Left   . INTRAOCULAR LENS INSERTION Bilateral    lens disease due to homocysteinuria  . NASAL SEPTUM SURGERY  march 2016   @ [facility]  . PARS PLANA VITRECTOMY Right 09/17/2013   Procedure: PARS PLANA VITRECTOMY WITH 25G REMOVAL/SUTURE SECONDARY INTRAOCULAR LENS;  Surgeon: Hayden Pedro, MD;  Location: [place];  Service: Ophthalmology;  Laterality: Right;  . PHOTOCOAGULATION Right 09/17/2013   Procedure: PHOTOCOAGULATION;  Surgeon: Hayden Pedro, MD;  Location: [place];  Service: Ophthalmology;  Laterality: Right;  HEADSCOPE LASER  . scapholunate ligament reconstruction of right hand  2011  . UMBILICAL HERNIA REPAIR N/A 05/07/2015   Procedure: HERNIA REPAIR UMBILICAL ADULT;  Surgeon: Florene Glen, MD;  Location: [facility];  Service: General;  Laterality: N/A;  . WRIST SURGERY Right     Medical History: Past Medical History:  Diagnosis Date  . Anxiety   . Arthritis    knees,Right wrist  . Avascular necrosis ([place])   . Brain bleed ([place])   . Cataract    bilateral repair with lens implants  . Depression   . Family history of adverse reaction to anesthesia    n/v-mom  . GERD (gastroesophageal reflux disease)   . 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 Hypertension   . Idiopathic progressive polyneuropathy   . Lens disease   . Neuromuscular disorder ([place])   . Obesity, Class II, BMI 35-39.9   . OCD (obsessive compulsive disorder)   . Paresthesia 06/10/2015  . Sleep apnea   . Stroke [facility])    with brain bleed at age 66-no deficits  . Umbilical hernia     Family History: Family History  Problem Relation Age of Onset  . Breast cancer Mother   . Hypertension Mother   . Hypertension Father   . Stroke  Maternal Grandmother   . Stroke Maternal Grandfather   . Stroke Paternal Grandmother   . Stroke Paternal Grandfather   . Stroke Other     Social History   Socioeconomic History  . Marital status: Single    Spouse name: n/a  . Number of children: 0  . Years of education: college  . Highest education level: Not on file  Occupational History  . Occupation: unemployed/disabled    Comment: Lexicographer  Social Needs  . Financial resource strain: Not hard at all  . Food insecurity:    Worry: Never true    Inability: Never true  .  Transportation needs:    Medical: Yes    Non-medical: Yes  Tobacco Use  . Smoking status: Never Smoker  . Smokeless tobacco: Never Used  Substance and Sexual Activity  . Alcohol use: No    Alcohol/week: 0.0 standard drinks  . Drug use: No  . Sexual activity: Not on file  Lifestyle  . Physical activity:    Days per week: 0 days    Minutes per session: 0 min  . Stress: Not at all  Relationships  . Social connections:    Talks on phone: Once a week    Gets together: Once a week    Attends religious service: Never    Active member of club or organization: No    Attends meetings of clubs or organizations: Never    Relationship status: Never married  . Intimate partner violence:    Fear of current or ex partner: No    Emotionally abused: No    Physically abused: No    Forced sexual activity: No  Other Topics Concern  . Not on file  Social History Narrative   Lives alone. Parents live nearby.  Applying for disability (OCD) due to wrist injury, hearing upcoming.      Patient drinks 4-5 cups of caffeine daily.   Patient is right handed.      Review of Systems  Constitutional: Negative.  Negative for chills, fatigue and unexpected weight change.  HENT: Negative.  Negative for congestion, rhinorrhea, sneezing and sore throat.   Eyes: Negative for redness.  Respiratory: Negative.  Negative for cough, chest tightness and shortness of  breath.   Cardiovascular: Negative.  Negative for chest pain and palpitations.  Gastrointestinal: Negative.  Negative for abdominal pain, constipation, diarrhea, nausea and vomiting.  Endocrine: Negative.   Genitourinary: Negative.  Negative for dysuria and frequency.  Musculoskeletal: Negative.  Negative for arthralgias, back pain, joint swelling and neck pain.  Skin: Negative.  Negative for rash.  Allergic/Immunologic: Negative.   Neurological: Negative.  Negative for tremors and numbness.  Hematological: Negative for adenopathy. Does not bruise/bleed easily.  Psychiatric/Behavioral: Negative.  Negative for behavioral problems, sleep disturbance and suicidal ideas. The patient is not nervous/anxious.     Vital Signs: BP 140/80 (BP Location: Right Arm, Patient Position: Sitting, Cuff Size: Large)   Pulse 80   Resp 16   Ht 6\' 3"  (1.905 m)   Wt 276 lb (125.2 kg)   SpO2 98%   BMI 34.50 kg/m    Physical Exam  Constitutional: He is oriented to person, place, and time. He appears well-developed and well-nourished. No distress.  HENT:  Head: Normocephalic and atraumatic.  Mouth/Throat: Oropharynx is clear and moist. No oropharyngeal exudate.  Eyes: Pupils are equal, round, and reactive to light. EOM are normal.  Neck: Normal range of motion. Neck supple. No JVD present. No tracheal deviation present. No thyromegaly present.  Cardiovascular: Normal rate, regular rhythm and normal heart sounds. Exam reveals no gallop and no friction rub.  No murmur heard. Pulmonary/Chest: Effort normal and breath sounds normal. No respiratory distress. He has no wheezes. He has no rales. He exhibits no tenderness.  Abdominal: Soft. There is no tenderness. There is no guarding.  Musculoskeletal: Normal range of motion.  Lymphadenopathy:    He has no cervical adenopathy.  Neurological: He is alert and oriented to person, place, and time. No cranial nerve deficit.  Skin: Skin is warm and dry. He is not  diaphoretic.  Psychiatric: He has a normal mood and  affect. His behavior is normal. Judgment and thought content normal.  Nursing note and vitals reviewed.   Assessment/Plan:  1. Neuropathy Encourage patient to take 25 mg Lyrica twice daily for 1 week.  At which point he can drop down to 125 mg Lyrica daily.  He can then safely stop the Lyrica.  Patient is very interested in stopping Lyrica as he feels like it is contributing to his anxiety as well as his profuse diarrhea. - pregabalin (LYRICA) 25 MG capsule; Take 1 capsule (25 mg total) by mouth 2 (two) times daily.  Dispense: 21 capsule; Refill: 0  2. Insomnia, unspecified type Patient was started on [place] in the hospital.  He reports good results with this as he had stopped taking his trazodone due to serotonin uptake. - ramelteon (ROZEREM) 8 MG tablet; Take 1 tablet (8 mg total) by mouth at bedtime.  Dispense: 30 tablet; Refill: 0  3. Diarrhea due to drug Patient is aware that diarrhea could be called from his medications and he is attempting to taper off Lyrica in hopes to help resolve some of his diarrhea.  4. Essential hypertension, benign Blood pressure is stable, continue current medications and supportive therapy.  5. GAD (generalized anxiety disorder) Encourage patient to continue taking his anxiety medications, he reports he like to get off of his Xanax however other medications have not worked for him and he would like to do this over time.  It was decided at this visit that we should get him off his Lyrica first and make sure he will do okay and that we can control his neuropathy before we adjust his anxiety medications.  6. Depression, recurrent ([place]) Stable, patient smiling laughing in exam room and reports that he is doing well at this time.  General Counseling: tabias swayze understanding of the findings of todays visit and agrees with plan of treatment. I have discussed any further diagnostic evaluation that may be  needed or ordered today. We also reviewed his medications today. he has been encouraged to call the office with any questions or concerns that should arise related to todays visit.    No orders of the defined types were placed in this encounter.   Meds ordered this encounter  Medications  . pregabalin (LYRICA) 25 MG capsule    Sig: Take 1 capsule (25 mg total) by mouth 2 (two) times daily.    Dispense:  21 capsule    Refill:  0  . ramelteon (ROZEREM) 8 MG tablet    Sig: Take 1 tablet (8 mg total) by mouth at bedtime.    Dispense:  30 tablet    Refill:  0    Time spent: 25 Minutes   This patient was seen by Orson Gear AGNP-C in Collaboration with Dr [name] as a part of collaborative care agreement     Kendell Bane AGNP-C Internal medicine

## 2018-10-22 ENCOUNTER — Telehealth: Payer: Self-pay | Admitting: Student in an Organized Health Care Education/Training Program

## 2018-10-22 ENCOUNTER — Telehealth: Payer: Self-pay | Admitting: Physician Assistant

## 2018-10-22 ENCOUNTER — Other Ambulatory Visit: Payer: Self-pay | Admitting: Physician Assistant

## 2018-10-22 NOTE — Telephone Encounter (Signed)
Largest cyst 6.2 cm. See the CT report from 09/27/18. Seen by Nicoletta Ba on 09/21/18 for abdominal pain. Amy has told him that she did not feel the cysts were the cause of his symptoms. He wants me to ask again.

## 2018-10-22 NOTE — Telephone Encounter (Signed)
Pt calling regarding message that he sent through my chart. He stated that he looked at the results of ct scan and noticed that it mentioned a cyst on his liver so wants to know if this if ok. Pls call him.

## 2018-10-22 NOTE — Telephone Encounter (Signed)
Patient called stating the vitamin Dr. Holley Raring suggested he start taking has not improved his pain. He wants to try the lidocaine infusion that was discussed. I scheduled patient for follow up appt with Dr. Holley Raring to discuss. He will need approval for procedure as he has [employer] ins.

## 2018-10-23 NOTE — Telephone Encounter (Signed)
I reviewed her note, the CT report and images.  I doubt that the cysts in his liver are causing any symptoms.

## 2018-10-23 NOTE — Telephone Encounter (Signed)
No answer. Patient message sent through the patient portal in My Chart.

## 2018-10-25 ENCOUNTER — Encounter: Payer: Self-pay | Admitting: Student in an Organized Health Care Education/Training Program

## 2018-10-25 ENCOUNTER — Other Ambulatory Visit: Payer: Self-pay

## 2018-10-25 ENCOUNTER — Ambulatory Visit
Payer: Medicare HMO | Attending: Student in an Organized Health Care Education/Training Program | Admitting: Student in an Organized Health Care Education/Training Program

## 2018-10-25 VITALS — BP 134/103 | HR 83 | Temp 98.4°F | Resp 18 | Ht 75.0 in | Wt 272.0 lb

## 2018-10-25 DIAGNOSIS — Z8673 Personal history of transient ischemic attack (TIA), and cerebral infarction without residual deficits: Secondary | ICD-10-CM | POA: Diagnosis not present

## 2018-10-25 DIAGNOSIS — M79671 Pain in right foot: Secondary | ICD-10-CM | POA: Diagnosis present

## 2018-10-25 DIAGNOSIS — F419 Anxiety disorder, unspecified: Secondary | ICD-10-CM | POA: Diagnosis not present

## 2018-10-25 DIAGNOSIS — E7211 Homocystinuria: Secondary | ICD-10-CM | POA: Insufficient documentation

## 2018-10-25 DIAGNOSIS — F329 Major depressive disorder, single episode, unspecified: Secondary | ICD-10-CM | POA: Diagnosis not present

## 2018-10-25 DIAGNOSIS — Z803 Family history of malignant neoplasm of breast: Secondary | ICD-10-CM | POA: Diagnosis not present

## 2018-10-25 DIAGNOSIS — G620 Drug-induced polyneuropathy: Secondary | ICD-10-CM | POA: Insufficient documentation

## 2018-10-25 DIAGNOSIS — F429 Obsessive-compulsive disorder, unspecified: Secondary | ICD-10-CM | POA: Insufficient documentation

## 2018-10-25 DIAGNOSIS — Z8249 Family history of ischemic heart disease and other diseases of the circulatory system: Secondary | ICD-10-CM | POA: Insufficient documentation

## 2018-10-25 DIAGNOSIS — G709 Myoneural disorder, unspecified: Secondary | ICD-10-CM | POA: Diagnosis not present

## 2018-10-25 DIAGNOSIS — G4730 Sleep apnea, unspecified: Secondary | ICD-10-CM | POA: Insufficient documentation

## 2018-10-25 DIAGNOSIS — I10 Essential (primary) hypertension: Secondary | ICD-10-CM | POA: Diagnosis not present

## 2018-10-25 DIAGNOSIS — G629 Polyneuropathy, unspecified: Secondary | ICD-10-CM

## 2018-10-25 DIAGNOSIS — Z6834 Body mass index (BMI) 34.0-34.9, adult: Secondary | ICD-10-CM | POA: Insufficient documentation

## 2018-10-25 DIAGNOSIS — Z7982 Long term (current) use of aspirin: Secondary | ICD-10-CM | POA: Insufficient documentation

## 2018-10-25 DIAGNOSIS — Z96642 Presence of left artificial hip joint: Secondary | ICD-10-CM | POA: Diagnosis not present

## 2018-10-25 DIAGNOSIS — Z880 Allergy status to penicillin: Secondary | ICD-10-CM | POA: Diagnosis not present

## 2018-10-25 DIAGNOSIS — E669 Obesity, unspecified: Secondary | ICD-10-CM | POA: Diagnosis not present

## 2018-10-25 DIAGNOSIS — Z79899 Other long term (current) drug therapy: Secondary | ICD-10-CM | POA: Insufficient documentation

## 2018-10-25 DIAGNOSIS — M879 Osteonecrosis, unspecified: Secondary | ICD-10-CM | POA: Diagnosis not present

## 2018-10-25 DIAGNOSIS — Z9884 Bariatric surgery status: Secondary | ICD-10-CM | POA: Insufficient documentation

## 2018-10-25 DIAGNOSIS — K219 Gastro-esophageal reflux disease without esophagitis: Secondary | ICD-10-CM | POA: Insufficient documentation

## 2018-10-25 DIAGNOSIS — T452X5A Adverse effect of vitamins, initial encounter: Secondary | ICD-10-CM | POA: Insufficient documentation

## 2018-10-25 DIAGNOSIS — G603 Idiopathic progressive neuropathy: Secondary | ICD-10-CM | POA: Diagnosis not present

## 2018-10-25 DIAGNOSIS — Z96649 Presence of unspecified artificial hip joint: Secondary | ICD-10-CM

## 2018-10-25 DIAGNOSIS — M48061 Spinal stenosis, lumbar region without neurogenic claudication: Secondary | ICD-10-CM | POA: Diagnosis not present

## 2018-10-25 NOTE — Progress Notes (Signed)
Safety precautions to be maintained throughout the outpatient stay will include: orient to surroundings, keep bed in low position, maintain call bell within reach at all times, provide assistance with transfer out of bed and ambulation.  

## 2018-10-25 NOTE — Progress Notes (Signed)
Patient's Name: Jesus Crosby.  MRN: [id number]  Referring Provider: Ronnell Freshwater, NP  DOB: 04-23-1966  PCP: Ronnell Freshwater, NP  DOS: 10/25/2018  Note by: Gillis Santa, MD  Service setting: Ambulatory outpatient  Specialty: Interventional Pain Management  Location: [facility] (AMB) Pain Management Facility    Patient type: Established   Primary Reason(s) for Visit: Evaluation of chronic illnesses with exacerbation, or progression (Level of risk: moderate) CC: Foot Pain (bilaterally, right is worse)  HPI  Jesus Crosby is a 52 y.o. year old, male patient, who comes today for a follow-up evaluation. 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[place]); HYPERLIPIDEMIA; ANXIETY; OBSESSIVE-COMPULSIVE DISORDER; DDD (degenerative disc disease), lumbosacral; HTN (hypertension); Intraocular lens dislocation; CVA (cerebral infarction); Diplopia; Sinus tachycardia; Paresthesia; Alcohol-induced mood disorder ([place]); OCD (obsessive compulsive disorder); Arthritis; Arthritis of knee; Bilateral cataracts; Class 1 obesity; Disorder of lens; Exomphalos; History of total hip arthroplasty; Major depressive disorder, single episode; Neuropathy; OSA on CPAP; Prediabetes; S/P laparoscopic sleeve gastrectomy; Spinal stenosis of lumbar region; Trochanteric bursitis; and Aphasia on their problem list. Jesus Crosby was last seen on 10/22/2018. His primarily concern today is the Foot Pain (bilaterally, right is worse)  Pain Assessment: Location: Right, Left Foot Radiating: to shin, bilaterally, right side is worse; also numbess in hands; right hand is worse Onset: More than a month ago Duration: Chronic pain Quality: Numbness, Throbbing, Constant Severity: 8 /10 (subjective, self-reported pain score)  Note: Reported level is inconsistent with clinical observations. Clinically the patient looks like a 3/10 A 3/10 is viewed as "Moderate" and described as significantly interfering with activities of daily living (ADL). It  becomes difficult to feed, bathe, get dressed, get on and off the toilet or to perform personal hygiene functions. Difficult to get in and out of bed or a chair without assistance. Very distracting. With effort, it can be ignored when deeply involved in activities. Information on the proper use of the pain scale provided to the patient today. When using our objective Pain Scale, levels between 6 and 10/10 are said to belong in an emergency room, as it progressively worsens from a 6/10, described as severely limiting, requiring emergency care not usually available at an outpatient pain management facility. At a 6/10 level, communication becomes difficult and requires great effort. Assistance to reach the emergency department may be required. Facial flushing and profuse sweating along with potentially dangerous increases in heart rate and blood pressure will be evident. Effect on ADL: limits daily activities Timing: Constant Modifying factors: "meds help a little bit" BP: (!) 134/103  HR: 83  Further details on both, my assessment(s), as well as the proposed treatment plan, please see below.    Patient follows up today for his second patient visit.  Since his first visit, patient was hospitalized for blurry vision and slurred speech.Ultrasound of his carotids, MRA of his head, MRI brain, CT of head were all done which were negative.  Medical team thought this was related to his Lyrica and trazodone.  He is currently weaning his Lyrica and is off of his trazodone.  He states that the pain in his feet is getting worse and they feel like "dead weight" at night.  Laboratory Chemistry  Inflammation Markers (CRP: Acute Phase) (ESR: Chronic Phase) Lab Results  Component Value Date   CRP 0.2 (L) 09/21/2018   ESRSEDRATE 9 06/03/2008  Rheumatology Markers Lab Results  Component Value Date   ANA NEG 08/03/2015                        Renal Function Markers Lab Results  Component  Value Date   BUN 10 10/06/2018   CREATININE 1.02 10/06/2018   BCR 13 08/29/2018   GFRAA >60 10/06/2018   GFRNONAA >60 10/06/2018                             Hepatic Function Markers Lab Results  Component Value Date   AST 57 (H) 10/04/2018   ALT 56 (H) 10/04/2018   ALBUMIN 4.7 10/04/2018   ALKPHOS 54 10/04/2018   HCVAB NEGATIVE 08/03/2015                        Electrolytes Lab Results  Component Value Date   NA 138 10/06/2018   K 4.4 10/06/2018   CL 100 10/06/2018   CALCIUM 9.1 10/06/2018                        Neuropathy Markers Lab Results  Component Value Date   HGBA1C 5.1 10/04/2018   HIV Non Reactive 10/05/2018                        CNS Tests No results found for: COLORCSF, APPEARCSF, RBCCOUNTCSF, WBCCSF, POLYSCSF, LYMPHSCSF, EOSCSF, PROTEINCSF, GLUCCSF, JCVIRUS, CSFOLI, IGGCSF                      Bone Pathology Markers No results found for: VD25OH, [id number], [id number], [id number], 25OHVITD1, 25OHVITD2, 25OHVITD3, TESTOFREE, TESTOSTERONE                       Coagulation Parameters Lab Results  Component Value Date   INR 1.13 10/04/2018   LABPROT 14.4 10/04/2018   APTT 30 10/04/2018   PLT 174 10/05/2018   DDIMER 0.28 08/15/2014                        Cardiovascular Markers Lab Results  Component Value Date   CKTOTAL 77 10/06/2018   TROPONINI <0.03 10/04/2018   HGB 12.6 (L) 10/05/2018   HCT 37.8 (L) 10/05/2018                         CA Markers No results found for: CEA, CA125, LABCA2                      Note: Lab results reviewed.  Recent Diagnostic Imaging Review  Lumbosacral Imaging: Lumbar MR wo contrast:  Results for orders placed during the hospital encounter of 01/12/05  MR Lumbar Spine Wo Contrast   Narrative Clinical Data: Low back pain radiating down the left leg.   MRI OF THE LUMBAR SPINE WITHOUT CONTRAST - 01/12/05:  No prior studies.   Technique: Multiplanar and multisequence images were obtained through the lumbar spine.  For purposes of this dictation, I assume that L5 is the lowest segmental lumbar type non-rib bearing vertebra.   Findings: There is intervertebral disc desiccation at L2-3, L4-5 and at L5-S1. There is loss of intervertebral disc height at L5-S1 with Modic type II degenerative end plate changes. The conus medullaris is at the L1-2 level and  appears unremarkable.   Additional findings at individual levels are as follows:  L2-3: Broad-based disc bulge is present at this level without significant central or foraminal stenosis.   L3-4: Minimal disc bulge L4-5. Broad-based disc bulge is present along with facet overgrowth and ligamentum flavum redundancy. There is mild left subarticular lateral recess stenosis.   L5-S1: Broad-based disc bulge is present causing mild right and moderate left foraminal stenosis and also slightly posteriorly displacing the left S1 nerve root in the lateral recess.   IMPRESSION:   Bilateral foraminal stenosis at L5-S1, left greater than right, due to foraminal component of broad-based disc bulge and facet overgrowth.   Mild subarticular lateral recess stenosis on the left at L4-5 and borderline left subarticular lateral recess stenosis at L5-S1.  Provider: Loni Beckwith   Lumbar MR w/wo contrast:  Results for orders placed during the hospital encounter of 05/23/13  MR Lumbar Spine W Wo Contrast   Narrative *RADIOLOGY REPORT*  Clinical Data: Low back and right leg pain with bladder symptoms.  MRI LUMBAR SPINE WITHOUT AND WITH CONTRAST  Technique:  Multiplanar and multiecho pulse sequences of the lumbar spine were obtained without and with intravenous contrast.  Contrast: 49m MULTIHANCE GADOBENATE DIMEGLUMINE 529 MG/ML IV SOLN  Comparison: Lumbar MRI January 12, 2005.  Lumbar spine radiographs 05/23/2013.  Findings: There is chronic disc and endplate degeneration at L5-S1 which appears mildly progressive.  There is no evidence of acute fracture or pars defect.  The  alignment is anatomic.  The conus medullaris extends to the L2 level and appears normal. There is no abnormal intradural enhancement.  There are no paraspinal abnormalities.  There is minimal disc desiccation at the L1-L2 and L2-L3 levels. However, from T12-T1 through L3-L4, there is no disc herniation, spinal stenosis or nerve root encroachment.  L4-L5:  Mild annular disc bulging is stable.  There is mildly progressive facet and ligamentous hypertrophy with small bilateral facet joint effusions.  Minimal narrowing of the lateral recesses is stable.  There is no foraminal compromise or exiting nerve root encroachment.  L5-S1: There is chronic degenerative disc disease with progressive loss of disc height, annular bulging and paraspinal osteophytes. Mild facet and ligamentous hypertrophy bilaterally is unchanged. Likewise, the mild right and moderate left foraminal stenosis is similar to the prior examination.  There is possible encroachment on either exiting L5 nerve root.  No S1 nerve root displacement is identified.  IMPRESSION:  1.  Mildly progressive chronic degenerative disc disease at L5-S1 with associated chronic left greater than right foraminal stenosis. There is possible chronic L5 nerve root encroachment. 2.  No other significant spinal stenosis or nerve root encroachment. 3.  No acute findings are identified to explain the patient's radicular symptoms. 4.  Mildly progressive facet joint effusions bilaterally at L4-L5.   Original Report Authenticated By: WRichardean Sale M.D.     Lumbar DG 2-3 views:  Results for orders placed during the hospital encounter of 05/23/13  DG Lumbar Spine 2-3 Views   Narrative *RADIOLOGY REPORT*  Clinical Data: Low back pain.  LUMBAR SPINE - 2-3 VIEW  Comparison: Lumbar spine MRI 01/12/2005.  Findings: Three views of the lumbar spine demonstrate no definite acute displaced fracture or compression type fracture.  Alignment is  anatomic.  Multilevel degenerative disc disease and facet arthropathy, most severe at L5-S1.  IMPRESSION: 1.  No acute radiographic abnormality of the lumbar spine. 2.  Multilevel degenerative disc disease and lumbar spondylosis, most severe at L5-S1.   Original Report  Authenticated By: Vinnie Langton, M.D.    Knee-R DG 4 views:  Results for orders placed during the hospital encounter of 07/22/15  DG Knee Complete 4 Views Right   Narrative CLINICAL DATA:  Bilateral knee pain.  Fall.  Initial evaluation .  EXAM: RIGHT KNEE - COMPLETE 4+ VIEW  COMPARISON:  None.  FINDINGS: Small knee joint effusion cannot be excluded. No acute bony or joint abnormality identified. No evidence of fracture or dislocation.  IMPRESSION: Small knee joint effusion cannot be excluded. No acute bony or joint abnormality identified.   Electronically Signed   By: Marcello Moores  Register   On: 07/22/2015 14:36    Knee-L DG 4 views:  Results for orders placed during the hospital encounter of 07/22/15  DG Knee Complete 4 Views Left   Narrative CLINICAL DATA:  Bilateral knee pain for 1 week after a fall 1 week ago, landing on both knees, initial encounter.  EXAM: LEFT KNEE - COMPLETE 4+ VIEW  COMPARISON:  11/02/2014.  FINDINGS: No joint effusion or fracture.  Trace patellofemoral osteophytosis.  IMPRESSION: 1. No acute findings. 2. Trace patellofemoral osteophytosis.   Electronically Signed   By: Lorin Picket M.D.   On: 07/22/2015 14:35    Hand Imaging: Hand-R DG Complete:  Results for orders placed during the hospital encounter of 03/06/14  DG Hand Complete Right   Narrative CLINICAL DATA:  Status post fall on right hand; right posterior lateral hand and thumb pain.  EXAM: RIGHT HAND - COMPLETE 3+ VIEW  COMPARISON:  Right wrist radiographs performed 03/05/2014  FINDINGS: There is no evidence of fracture or dislocation. The joint spaces are preserved; the soft tissues are  unremarkable in appearance.  Mild apparent widening of the scapholunate interspace is better characterized on recent wrist radiograph. A plate and screws are again noted along the distal radius. The carpal rows are otherwise grossly intact.  IMPRESSION: No evidence of fracture or dislocation.   Electronically Signed   By: Garald Balding M.D.   On: 03/06/2014 22:16    Hand-L DG Complete: No results found for this or any previous visit.  Complexity Note: Imaging results reviewed. Results shared with Jesus Crosby, using [employer].                         Meds   Current Outpatient Medications:  .  Alpha-Lipoic Acid 200 MG CAPS, Take by mouth. Take 3 cap daily, Disp: , Rfl:  .  ALPRAZolam (XANAX) 0.5 MG tablet, Take 0.5 mg by mouth 3 (three) times daily., Disp: , Rfl:  .  amLODipine (NORVASC) 5 MG tablet, TAKE 1 TABLET(5 MG) BY MOUTH TWICE DAILY (Patient taking differently: Take 5 mg by mouth 2 (two) times daily. ), Disp: 180 tablet, Rfl: 0 .  aspirin EC 81 MG tablet, Take 81 mg by mouth daily., Disp: , Rfl:  .  atorvastatin (LIPITOR) 10 MG tablet, Take 1 tablet (10 mg total) by mouth daily., Disp: 90 tablet, Rfl: 3 .  Betaine (CYSTADANE) POWD, Take 8 Sco[place] by mouth 3 (three) times daily., Disp: , Rfl:  .  Cyanocobalamin (VITAMIN B 12 PO), Take 1,000 mcg by mouth at bedtime. , Disp: , Rfl:  .  cycloSPORINE (RESTASIS) 0.05 % ophthalmic emulsion, Place 1 drop into both eyes 2 (two) times daily., Disp: , Rfl:  .  dicyclomine (BENTYL) 10 MG capsule, Take 1 capsule (10 mg total) by mouth every 6 (six) hours as needed for spasms., Disp: 90  capsule, Rfl: 2 .  folic acid (FOLVITE) 161 MCG tablet, Take 1,200 mcg by mouth at bedtime. , Disp: , Rfl:  .  furosemide (LASIX) 20 MG tablet, Take 1 tablet (20 mg total) by mouth every morning., Disp: 30 tablet, Rfl: 3 .  gabapentin (NEURONTIN) 600 MG tablet, Take 1,200 mg by mouth 2 (two) times daily. , Disp: , Rfl:  .  losartan (COZAAR) 50 MG tablet,  Take half tab po qd, Disp: 45 tablet, Rfl: 3 .  metoprolol tartrate (LOPRESSOR) 50 MG tablet, Take 1.5 tablets (75 mg total) by mouth 2 (two) times daily., Disp: 270 tablet, Rfl: 0 .  pregabalin (LYRICA) 25 MG capsule, Take 1 capsule (25 mg total) by mouth 2 (two) times daily. (Patient taking differently: Take 25 mg by mouth daily. ), Disp: 21 capsule, Rfl: 0 .  pyridOXINE (VITAMIN B-6) 100 MG tablet, Take 500 mg by mouth at bedtime. , Disp: , Rfl:  .  sertraline (ZOLOFT) 100 MG tablet, Take 200 mg by mouth daily. , Disp: , Rfl:  .  ramelteon (ROZEREM) 8 MG tablet, Take 1 tablet (8 mg total) by mouth at bedtime. (Patient not taking: Reported on 10/25/2018), Disp: 30 tablet, Rfl: 0  ROS  Constitutional: Denies any fever or chills Gastrointestinal: No reported hemesis, hematochezia, vomiting, or acute GI distress Musculoskeletal: Denies any acute onset joint swelling, redness, loss of ROM, or weakness Neurological: No reported episodes of acute onset apraxia, aphasia, dysarthria, agnosia, amnesia, paralysis, loss of coordination, or loss of consciousness  Allergies  Jesus Crosby is allergic to pineapple flavor; pineapple; amoxicillin; and penicillins.  [place]  Drug: Jesus Crosby  reports that he does not use drugs. Alcohol:  reports that he does not drink alcohol. Tobacco:  reports that he has never smoked. He has never used smokeless tobacco. 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 Surgical: Mr. Boeve  has a past surgical history that includes Intraocular lens insertion  (Bilateral); Wrist surgery (Right); Dermabrasion of face; Eye surgery; Pars plana vitrectomy (Right, 09/17/2013); Photocoagulation (Right, 09/17/2013); Gas/fluid exchange (Right, 09/17/2013); Nasal septum surgery (march 2016); Umbilical hernia repair (N/A, 05/07/2015); Colonoscopy with propofol (N/A, 07/30/2015); Esophagogastroduodenoscopy (egd) with propofol (N/A, 07/30/2015); scapholunate ligament reconstruction of right hand (2011); distal radius fracture of right hand (plates, screws, and pins) (2011); gastric sleeve surgery (08/17/2016); and Hip Arthroplasty (Left). Family: family history includes Breast cancer in his mother; Hypertension in his father and mother; Stroke in his maternal grandfather, maternal grandmother, other, paternal grandfather, and paternal grandmother.  Constitutional Exam  General appearance: Well nourished, well developed, and well hydrated. In no apparent acute distress Vitals:   10/25/18 1134  BP: (!) 134/103  Pulse: 83  Resp: 18  Temp: 98.4 F (36.9 C)  TempSrc: Oral  SpO2: 93%  Weight: 272 lb (123.4 kg)  Height: '6\' 3"'$  (1.905 m)   BMI Assessment: Estimated body mass index is 34 kg/m as calculated from the following:   Height as of this encounter: '6\' 3"'$  (1.905 m).   Weight as of this encounter: 272 lb (123.4 kg).  BMI interpretation table: BMI level Category Range association with higher incidence of chronic pain  <18 kg/m2 Underweight   18.5-24.9 kg/m2 Ideal  body weight   25-29.9 kg/m2 Overweight Increased incidence by 20%  30-34.9 kg/m2 Obese (Class I) Increased incidence by 68%  35-39.9 kg/m2 Severe obesity (Class II) Increased incidence by 136%  >40 kg/m2 Extreme obesity (Class III) Increased incidence by 254%   Patient's current BMI Ideal Body weight  Body mass index is 34 kg/m. Ideal body weight: 84.5 kg (186 lb 4.6 oz) Adjusted ideal body weight: 100.1 kg (220 lb 9.2 oz)   BMI Readings from Last 4 Encounters:  10/25/18 34.00 kg/m  10/17/18  34.50 kg/m  10/04/18 35.24 kg/m  10/05/18 35.12 kg/m   Wt Readings from Last 4 Encounters:  10/25/18 272 lb (123.4 kg)  10/17/18 276 lb (125.2 kg)  10/04/18 281 lb 15.5 oz (127.9 kg)  10/05/18 281 lb (127.5 kg)  Psych/Mental status: Alert, oriented x 3 (person, place, & time)       Eyes: PERLA Respiratory: No evidence of acute respiratory distress  Cervical Spine Area Exam  Skin & Axial Inspection: No masses, redness, edema, swelling, or associated skin lesions Alignment: Symmetrical Functional ROM: Unrestricted ROM      Stability: No instability detected Muscle Tone/Strength: Functionally intact. No obvious neuro-muscular anomalies detected. Sensory (Neurological): Unimpaired Palpation: No palpable anomalies              Upper Extremity (UE) Exam    Side: Right upper extremity  Side: Left upper extremity  Skin & Extremity Inspection: Skin color, temperature, and hair growth are WNL. No peripheral edema or cyanosis. No masses, redness, swelling, asymmetry, or associated skin lesions. No contractures.  Skin & Extremity Inspection: Skin color, temperature, and hair growth are WNL. No peripheral edema or cyanosis. No masses, redness, swelling, asymmetry, or associated skin lesions. No contractures.  Functional ROM: Unrestricted ROM          Functional ROM: Unrestricted ROM          Muscle Tone/Strength: Functionally intact. No obvious neuro-muscular anomalies detected.  Muscle Tone/Strength: Functionally intact. No obvious neuro-muscular anomalies detected.  Sensory (Neurological): Unimpaired          Sensory (Neurological): Unimpaired          Palpation: No palpable anomalies              Palpation: No palpable anomalies              Provocative Test(s):  Phalen's test: deferred Tinel's test: deferred Apley's scratch test (touch opposite shoulder):  Action 1 (Across chest): deferred Action 2 (Overhead): deferred Action 3 (LB reach): deferred   Provocative Test(s):  Phalen's  test: deferred Tinel's test: deferred Apley's scratch test (touch opposite shoulder):  Action 1 (Across chest): deferred Action 2 (Overhead): deferred Action 3 (LB reach): deferred    Thoracic Spine Area Exam  Skin & Axial Inspection: No masses, redness, or swelling Alignment: Symmetrical Functional ROM: Unrestricted ROM Stability: No instability detected Muscle Tone/Strength: Functionally intact. No obvious neuro-muscular anomalies detected. Sensory (Neurological): Unimpaired Muscle strength & Tone: No palpable anomalies  Lumbar Spine Area Exam  Skin & Axial Inspection: No masses, redness, or swelling Alignment: Symmetrical Functional ROM: Unrestricted ROM       Stability: No instability detected Muscle Tone/Strength: Functionally intact. No obvious neuro-muscular anomalies detected. Sensory (Neurological): Unimpaired Palpation: No palpable anomalies       Provocative Tests: Hyperextension/rotation test: deferred today       Lumbar quadrant test (Kemp's test): deferred today       Lateral bending test: deferred today  Patrick's Maneuver: deferred today                   FABER test: deferred today                   S-I anterior distraction/compression test: deferred today         S-I lateral compression test: deferred today         S-I Thigh-thrust test: deferred today         S-I Gaenslen's test: deferred today          Gait & Posture Assessment  Ambulation: Unassisted Gait: Relatively normal for age and body habitus Posture: WNL   Lower Extremity Exam    Side: Right lower extremity  Side: Left lower extremity  Stability: No instability observed          Stability: No instability observed          Skin & Extremity Inspection: Skin color, temperature, and hair growth are WNL. No peripheral edema or cyanosis. No masses, redness, swelling, asymmetry, or associated skin lesions. No contractures.  Skin & Extremity Inspection: Skin color, temperature, and hair growth are  WNL. No peripheral edema or cyanosis. No masses, redness, swelling, asymmetry, or associated skin lesions. No contractures.  Functional ROM: Unrestricted ROM                  Functional ROM: Unrestricted ROM                  Muscle Tone/Strength: Functionally intact. No obvious neuro-muscular anomalies detected.  Muscle Tone/Strength: Functionally intact. No obvious neuro-muscular anomalies detected.  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 Diagnoses of Spinal stenosis of lumbar region, unspecified whether neurogenic claudication present, Obsessive-compulsive disorder, unspecified type, History of total hip replacement, unspecified laterality, and Current episode of major depressive disorder without prior episode, unspecified depression episode severity were also pertinent to this visit.  Status Diagnosis  Persistent Persistent Persistent 1. 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 Spinal stenosis of lumbar region, unspecified whether neurogenic claudication present   3. Obsessive-compulsive disorder, unspecified type   4. History of total hip replacement, unspecified laterality   5. Current episode of major depressive disorder without prior episode, unspecified depression episode severity      General Recommendations: The pain condition that the patient suffers from is best treated with a multidisciplinary approach that involves an increase in physical activity to prevent de-conditioning and worsening of the pain cycle, as well as psychological counseling  (formal and/or informal) to address the co-morbid psychological affects of pain. Treatment will often involve judicious use of pain medications and interventional procedures to decrease the pain, allowing the patient to participate in the physical activity that will ultimately produce long-lasting pain reductions. The goal of the multidisciplinary approach is to return the patient to a higher level of overall function and to restore their ability to perform activities of daily living.  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  Patient is tried various neuropathic medications  including Lyrica 150 mg twice daily which resulted in blurry vision.  Patient has a history of multiple eye surgeries including lens implants and replacements.  Patient is also tried Topamax, Cymbalta which resulted in side effects of nausea and were primarily ineffective.  He does find benefit with gabapentin 1200 mg twice daily.  Patient has been evaluated at [facility] pain clinic in psychology for spinal cord stimulation but was not deemed a candidate.  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  Patient endorsed understanding.  Patient could be a candidate for buprenorphine therapy and possibly even levorphanol with its NMDA antagonism that could be effective for his neuropathic pain.  Patient's QTC on his EKG is 435 ms.  No history of arrhythmias or significant cardiac history.  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  Patient would  like to proceed.  Plan of Care   Lab-work, procedure(s), and/or referral(s): Orders Placed This Encounter  Procedures  . LIDOCAINE INFUSION   Provider-requested follow-up: Return for Procedure.  Time Note: Greater than 50% of the 25 minute(s) of face-to-face time spent with Jesus Crosby, was spent in counseling/coordination of care regarding: the appropriate use of the pain scale, Jesus Crosby primary cause of pain, the treatment plan, treatment alternatives, the risks and possible complications of proposed treatment, going over the informed consent, realistic expectations and the goals of pain management (increased in functionality).  Future Appointments  Date Time Provider [place]  11/15/2018  8:45 AM Versie Starks, Audie Clear, NP [place] None    Primary Care Physician: Ronnell Freshwater, NP Location: [facility] Note by: Gillis Santa, M.D Date: 10/25/2018; Time: 1:04 PM  There are no Patient Instructions on file for this visit.

## 2018-10-29 ENCOUNTER — Telehealth: Payer: Self-pay

## 2018-10-29 NOTE — Telephone Encounter (Signed)
Nothing further to add. Thanks

## 2018-10-29 NOTE — Telephone Encounter (Signed)
The pt has been constipated after taking bentyl and has had hard stool with some BR rectal bleeding. This bleeding is off/on with no other symptoms other than hard stool.  He says he has a history of hemorrhoids several years ago, history of chronic diarrhea. He was advised to use bentyl only as needed and try 1/2 cap of miralax until his bowels are softer. He will also add fiber to his regimen. He will call in 1 week to update on his condition and he was also advised to go to ED or urgent care if bleeding becomes heavy or he develops SOB, pain or other symptoms.     Dr Ardis Hughs please review and advise of any further req's.

## 2018-10-31 ENCOUNTER — Encounter: Payer: Self-pay | Admitting: Student in an Organized Health Care Education/Training Program

## 2018-10-31 ENCOUNTER — Other Ambulatory Visit: Payer: Self-pay

## 2018-10-31 ENCOUNTER — Ambulatory Visit
Payer: Medicare HMO | Attending: Student in an Organized Health Care Education/Training Program | Admitting: Student in an Organized Health Care Education/Training Program

## 2018-10-31 VITALS — BP 134/80 | HR 68 | Temp 98.4°F | Resp 16 | Ht 75.0 in | Wt 274.2 lb

## 2018-10-31 DIAGNOSIS — Z91018 Allergy to other foods: Secondary | ICD-10-CM | POA: Insufficient documentation

## 2018-10-31 DIAGNOSIS — M79671 Pain in right foot: Secondary | ICD-10-CM | POA: Diagnosis present

## 2018-10-31 DIAGNOSIS — Z880 Allergy status to penicillin: Secondary | ICD-10-CM | POA: Insufficient documentation

## 2018-10-31 DIAGNOSIS — G629 Polyneuropathy, unspecified: Secondary | ICD-10-CM | POA: Insufficient documentation

## 2018-10-31 DIAGNOSIS — Z9102 Food additives allergy status: Secondary | ICD-10-CM | POA: Insufficient documentation

## 2018-10-31 MED ORDER — LIDOCAINE IN D5W 4-5 MG/ML-% IV SOLN
INTRAVENOUS | Status: AC
  Filled 2018-10-31: qty 500

## 2018-10-31 MED ORDER — LIDOCAINE IN D5W 4-5 MG/ML-% IV SOLN
4.0000 mg/min | INTRAVENOUS | Status: AC
  Administered 2018-10-31: 4 mg/min via INTRAVENOUS

## 2018-10-31 MED ORDER — MIDAZOLAM HCL 5 MG/5ML IJ SOLN
1.0000 mg | INTRAMUSCULAR | Status: DC | PRN
  Administered 2018-10-31: 1 mg via INTRAVENOUS

## 2018-10-31 MED ORDER — MIDAZOLAM HCL 5 MG/5ML IJ SOLN
INTRAMUSCULAR | Status: AC
  Filled 2018-10-31: qty 5

## 2018-10-31 MED ORDER — LACTATED RINGERS IV SOLN
1000.0000 mL | Freq: Once | INTRAVENOUS | Status: AC
  Administered 2018-10-31: 1000 mL via INTRAVENOUS

## 2018-10-31 NOTE — Progress Notes (Signed)
Safety precautions to be maintained throughout the outpatient stay will include: orient to surroundings, keep bed in low position, maintain call bell within reach at all times, provide assistance with transfer out of bed and ambulation.  

## 2018-10-31 NOTE — Progress Notes (Signed)
Patient's Name: Jesus Crosby.  MRN: [id number]  Referring Provider: Gillis Santa, MD  DOB: [birth year]  PCP: Ronnell Freshwater, NP  DOS: 10/31/2018  Note by: Gillis Santa, MD  Service setting: Ambulatory outpatient  Specialty: Interventional Pain Management  Patient type: Established  Location: [facility] (AMB) Pain Management Facility  Visit type: Interventional Procedure   Primary Reason for Visit: Interventional Pain Management Treatment. CC: Foot Pain (bilaterally) and Hand Pain (bilaterally)  Procedure:            Type: Diagnostic Intravenous lidocaine infusion Region: Systemic Level: Upper Extremity IV access Laterality: Please see nurses note.    Position: Supine   Indications: 1. Neuropathy (secondary to B6 toxicity)    Pain Score: Pre-procedure: 8 /10 Post-procedure: 4 /10  Pre-op Assessment:  Jesus Crosby is a 52 y.o. (year old), male patient, seen today for interventional treatment. He  has a past surgical history that includes Intraocular lens insertion (Bilateral); Wrist surgery (Right); Dermabrasion of face; Eye surgery; Pars plana vitrectomy (Right, 09/17/2013); Photocoagulation (Right, 09/17/2013); Gas/fluid exchange (Right, 09/17/2013); Nasal septum surgery (march 2016); Umbilical hernia repair (N/A, 05/07/2015); Colonoscopy with propofol (N/A, 07/30/2015); Esophagogastroduodenoscopy (egd) with propofol (N/A, 07/30/2015); scapholunate ligament reconstruction of right hand (2011); distal radius fracture of right hand (plates, screws, and pins) (2011); gastric sleeve surgery (08/17/2016); and Hip Arthroplasty (Left). Jesus Crosby has a current medication list which includes the following prescription(s): alpha-lipoic acid, alprazolam, amlodipine, aspirin ec, atorvastatin, cystadane, cyanocobalamin, cyclosporine, dicyclomine, folic acid, furosemide, gabapentin, losartan, metoprolol tartrate, pyridoxine, ramelteon, sertraline, and pregabalin, and the following Facility-Administered Medications:  midazolam. His primarily concern today is the Foot Pain (bilaterally) and Hand Pain (bilaterally)  Initial Vital Signs:  Pulse/HCG Rate: 75ECG Heart Rate: 65 Temp: 98.4 F (36.9 C) Resp: 16 BP: 124/80 SpO2: 95 %  BMI: Estimated body mass index is 34.27 kg/m as calculated from the following:   Height as of this encounter: 6\' 3"  (1.905 m).   Weight as of this encounter: 274 lb 3.2 oz (124.4 kg).  Risk Assessment: Allergies: Reviewed. He is allergic to pineapple flavor; pineapple; amoxicillin; and penicillins.  Allergy Precautions: None required Coagulopathies: Reviewed. None identified.  Blood-thinner therapy: None at this time Active Infection(s): Reviewed. None identified. Jesus Crosby is afebrile  Site Confirmation: Jesus Crosby was asked to confirm the procedure and laterality before marking the site Procedure checklist: Completed Consent: Before the procedure and under the influence of no sedative(s), amnesic(s), or anxiolytics, the patient was informed of the treatment options, risks and possible complications. To fulfill our ethical and legal obligations, as recommended by the American Medical Association's Code of Ethics, I have informed the patient of my clinical impression; the nature and purpose of the treatment or procedure; the risks, benefits, and possible complications of the intervention; the alternatives, including doing nothing; the risk(s) and benefit(s) of the alternative treatment(s) or procedure(s); and the risk(s) and benefit(s) of doing nothing. The patient was provided information about the general risks and possible complications associated with any invasive procedure. These may include, but are not limited to: failure to achieve desired goals; pain; worsening of initial condition; infections; bleeding; organ or nerve damage; allergic reactions; and death. In addition, the patient was informed of those risks and complications associated to this procedure, such as failure to  decrease pain; infection; bleeding; phlebitis; vascular extravasation; tissue necrosis; tenderness at IV access site; skin or nerve damage with subsequent damage to sensory, motor, and/or autonomic systems; worsening of the pain; persistent pain, numbness, and/or  weakness of one or several areas of the body; cardiac dysrhythmias; seizure; stroke; and/or death. Furthermore, the patient was informed of those risks and complications associated with the medications used during the procedure. These include, but are not limited to: allergic reactions (i.e.: anaphylactic or anaphylactoid reactions); cardiac conduction blockade; poisoning; toxicity; CNS depression; cardiovascular depression and collapse; muscle twitching; tonic-clonic seizures; convulsions; loss of consciousness; coma; respiratory depression; arrest; and/or death. Finally, the patient was informed that Medicine is not an exact science; therefore, there is also the possibility of unforeseen or unpredictable risks and/or possible complications that may result in a catastrophic outcome. The patient indicated having understood very clearly. We have given the patient no guarantees and we have made no promises. Enough time was given to the patient to ask questions, all of which were answered to the patient's satisfaction. Jesus Crosby has indicated that he wanted to continue with the procedure. Attestation: I, the ordering provider, attest that I have discussed with the patient the benefits, risks, side-effects, alternatives, likelihood of achieving goals, and potential problems during recovery for the procedure that I have provided informed consent. Date  Time: 10/31/2018  9:06 AM  Pre-Procedure Preparation:  Monitoring: As per clinic protocol. Respiration, ETCO2, SpO2, BP, heart rate and rhythm monitor placed and checked for adequate function Safety Precautions: Patient was assessed for positional comfort and pressure points before starting the  procedure. Time-out: I initiated and conducted the "Time-out" before starting the procedure, as per protocol. The patient was asked to participate by confirming the accuracy of the "Time Out" information. Verification of the correct person, site, and procedure were performed and confirmed by me, the nursing staff, and the patient. "Time-out" conducted as per Joint Commission's Universal Protocol (UP.01.01.01). Time: 1007  Description of Procedure:          Target Area: Intravenous Approach: Intravenous angiocath approach. Area Prepped: Antecubital Prepping solution: Isopropyl Alcohol (70%) Safety Precautions: Medications properly checked for expiration dates. SDV (single dose vial) medications used.  Dose Calculation: Lidocaine preparation: 2 grams of IV lidocaine in 500 mL of D5W (4 mg/mL) (0.4% Lidocaine) Maximum Lidocaine Dose: 4 mg/kg x 274 lb 3.2 oz (124.4 kg) =  496 mg.  Calculated infusion rate: 440 mg divided by 4 mg/mL = 110 mL in 60 minutes (110 mL/hr) Set rate: 110 mL/hr Duration of Infusion: 1 hour  Description of the Procedure: Protocol guidelines were followed. The patient was placed in position. Informed consent was obtained.  He was cautioned to be on the look out for prodromal symptoms of a possible impending seizure, such as: new onset tinnitus; perioral, circumoral, or tongue numbness; or a metallic taste in his mouth, lightheadedness; dizziness; visual or auditory disturbances; disorientation; profound drowsiness; or muscle twitching. An IV was started and the patient's dose calculated as above. The infusion was carried out with a nurse at his side 100% of the time, monitoring his vitals as per protocol. Pre-procedure sedation was started 5-10 minutes before infusion and midazolam was kept at bedside during the entire procedure, along with CPR equipment, as per protocol.  Vitals:   10/31/18 1057 10/31/18 1102 10/31/18 1107 10/31/18 1115  BP: 129/83 132/84 135/88 134/80   Pulse: 67 66 70 68  Resp: 18 16 18 16   Temp:      TempSrc:      SpO2: 95% 96% 95% 94%  Weight:      Height:        Start Time: 1007 hrs. End Time: 1107 hrs. Materials: IV  infusion pump Medication(s): IV Lidocaine.  Imaging Guidance:          Type of Imaging Technique: None used Indication(s): N/A Exposure Time: No patient exposure Contrast: None used. Fluoroscopic Guidance: N/A Ultrasound Guidance: N/A Interpretation: N/A  Antibiotic Prophylaxis:   Anti-infectives (From admission, onward)   None     Indication(s): None identified  Post-operative Assessment:  Post-procedure Vital Signs:  Pulse/HCG Rate: 6868 Temp: 98.4 F (36.9 C) Resp: 16 BP: 134/80 SpO2: 94 %  EBL: None  Complications: No immediate post-treatment complications observed by team, or reported by patient.  Note: The patient tolerated the entire procedure well. A repeat set of vitals were taken after the procedure and the patient was kept under observation following institutional policy, for this type of procedure. Post-procedural neurological assessment was performed, showing return to baseline, prior to discharge. The patient was provided with post-procedure discharge instructions, including a section on how to identify potential problems. Should any problems arise concerning this procedure, the patient was given instructions to immediately contact us, at any time, without hesitation. In any case, we plan to contact the patient by telephone for a follow-up status report regarding this interventional procedure.  Comments:  No additional relevant information.  Plan of Care   Procedure Orders    No procedure(s) ordered today    Medications ordered for procedure: Meds ordered this encounter  Medications  . lidocaine (cardiac) 2000 mg in dextrose 5% 500 mL (4mg /mL) IV infusion    IV Lidocaine 2 grams in 500 mL of D5W. (4 mg/mL) (0.4% Lidocaine)  . lactated ringers infusion 1,000 mL  . midazolam  (VERSED) 5 MG/5ML injection 1-2 mg    Make sure Flumazenil is available in the pyxis when using this medication. If oversedation occurs, administer 0.2 mg IV over 15 sec. If after 45 sec no response, administer 0.2 mg again over 1 min; may repeat at 1 min intervals; not to exceed 4 doses (1 mg)   Medications administered: We administered lidocaine, lactated ringers, and midazolam.  See the medical record for exact dosing rate.  New Prescriptions   No medications on file   Disposition: Discharge home  Discharge Date & Time: 10/31/2018; 1120 hrs.   Physician-requested Follow-up: Return in about 6 weeks (around 12/12/2018) for Post Procedure Evaluation.  Future Appointments  Date Time Provider [place]  11/15/2018  8:45 AM Kendell Bane, NP [facility] None  12/11/2018 11:30 AM Gillis Santa, MD [facility] None   Primary Care Physician: Ronnell Freshwater, NP Location: [facility] Note by: Gillis Santa, MD Date: 10/31/2018; Time: 12:52 PM  Disclaimer:  Medicine is not an exact science. The only guarantee in medicine is that nothing is guaranteed. It is important to note that the decision to proceed with this intervention was based on the information collected from the patient. The Data and conclusions were drawn from the patient's questionnaire, the interview, and the physical examination. Because the information was provided in large part by the patient, it cannot be guaranteed that it has not been purposely or unconsciously manipulated. Every effort has been made to obtain as much relevant data as possible for this evaluation. It is important to note that the conclusions that lead to this procedure are derived in large part from the available data. Always take into account that the treatment will also be dependent on availability of resources and existing treatment guidelines, considered by other Pain Management Practitioners as being common knowledge and  practice, at the  time of the intervention. For Medico-Legal purposes, it is also important to point out that variation in procedural techniques and pharmacological choices are the acceptable norm. The indications, contraindications, technique, and results of the above procedure should only be interpreted and judged by a Board-Certified Interventional Pain Specialist with extensive familiarity and expertise in the same exact procedure and technique.

## 2018-10-31 NOTE — Patient Instructions (Signed)

## 2018-11-05 ENCOUNTER — Telehealth: Payer: Self-pay | Admitting: Student in an Organized Health Care Education/Training Program

## 2018-11-05 NOTE — Telephone Encounter (Signed)
Pt said that Dr. Holley Raring increased alpha liporic acid to 1200mg  and he wanted to know if he was supposed to take this all at one time or space it out.

## 2018-11-05 NOTE — Telephone Encounter (Signed)
Dr. Holley Raring- Please advise and I will call patient. Thank you- Anderson Malta

## 2018-11-06 ENCOUNTER — Telehealth: Payer: Self-pay | Admitting: Student in an Organized Health Care Education/Training Program

## 2018-11-06 DIAGNOSIS — G629 Polyneuropathy, unspecified: Secondary | ICD-10-CM

## 2018-11-06 NOTE — Telephone Encounter (Signed)
Patient notified of above  And will schedule a Lidocaine infusion for next week.  Pre procedure instructions given.

## 2018-11-06 NOTE — Telephone Encounter (Signed)
Please see note that I placed yesterday- ok for patient to take 1200 mg at once. Nursing staff can call pt to let him know.  Also patient can have repeat infusion next week. Order placed.  Please call patient to have this scheduled.   PRN order placed but can repeat next week.  Orders Placed This Encounter  Procedures  . LIDOCAINE INFUSION    Standing Status:   Standing    Number of Occurrences:   18    Standing Expiration Date:   05/07/2020    Scheduling Instructions:     Laterality: Does not apply     Level(s): Peripheral vascular access     Sedation: With IV Sedation     Scheduling Timeframe: The patient will call to schedule (PRN)     NOTE: Continuous cardiovascular monitoring required (ECG, NIBPM, SpO2)    Order Specific Question:   Where will this procedure be performed?    Answer:   [facility]

## 2018-11-06 NOTE — Telephone Encounter (Signed)
1200 mg once after meal is fine.

## 2018-11-06 NOTE — Telephone Encounter (Signed)
Patient is scheduled for infusion on 11-12-18 at 8:45

## 2018-11-06 NOTE — Telephone Encounter (Signed)
Patient lvmail at 1:39 stating the lidocain infusion worked real well for 2 days. Was wondering when he could get another one. Also wanted to know if Dr. Holley Raring wanted him to take the entire dosage of Alpha-Lipoic Acid at one time or divide it into 2 times daily? Please call him and let him know.

## 2018-11-12 ENCOUNTER — Ambulatory Visit (HOSPITAL_BASED_OUTPATIENT_CLINIC_OR_DEPARTMENT_OTHER): Payer: Medicare HMO | Admitting: Student in an Organized Health Care Education/Training Program

## 2018-11-12 ENCOUNTER — Ambulatory Visit
Admission: RE | Admit: 2018-11-12 | Discharge: 2018-11-12 | Disposition: A | Discharge status: Home / Self Care | Payer: Medicare HMO | Source: Ambulatory Visit | Attending: Student in an Organized Health Care Education/Training Program | Admitting: Student in an Organized Health Care Education/Training Program

## 2018-11-12 ENCOUNTER — Other Ambulatory Visit: Payer: Self-pay

## 2018-11-12 ENCOUNTER — Encounter: Payer: Self-pay | Admitting: Student in an Organized Health Care Education/Training Program

## 2018-11-12 ENCOUNTER — Other Ambulatory Visit: Payer: Self-pay | Admitting: Student in an Organized Health Care Education/Training Program

## 2018-11-12 VITALS — BP 121/74 | HR 72 | Temp 98.2°F | Resp 17 | Ht 75.0 in | Wt 271.0 lb

## 2018-11-12 DIAGNOSIS — Z96642 Presence of left artificial hip joint: Secondary | ICD-10-CM | POA: Insufficient documentation

## 2018-11-12 DIAGNOSIS — Z7982 Long term (current) use of aspirin: Secondary | ICD-10-CM | POA: Insufficient documentation

## 2018-11-12 DIAGNOSIS — Z79899 Other long term (current) drug therapy: Secondary | ICD-10-CM | POA: Insufficient documentation

## 2018-11-12 DIAGNOSIS — G629 Polyneuropathy, unspecified: Secondary | ICD-10-CM | POA: Diagnosis present

## 2018-11-12 DIAGNOSIS — Z880 Allergy status to penicillin: Secondary | ICD-10-CM | POA: Insufficient documentation

## 2018-11-12 DIAGNOSIS — R52 Pain, unspecified: Secondary | ICD-10-CM

## 2018-11-12 MED ORDER — MIDAZOLAM HCL 5 MG/5ML IJ SOLN
1.0000 mg | INTRAMUSCULAR | Status: DC | PRN
  Administered 2018-11-12: 1 mg via INTRAVENOUS

## 2018-11-12 MED ORDER — MIDAZOLAM HCL 5 MG/5ML IJ SOLN
INTRAMUSCULAR | Status: AC
  Filled 2018-11-12: qty 5

## 2018-11-12 MED ORDER — LIDOCAINE IN D5W 4-5 MG/ML-% IV SOLN
INTRAVENOUS | Status: AC
  Filled 2018-11-12: qty 500

## 2018-11-12 MED ORDER — LIDOCAINE IN D5W 4-5 MG/ML-% IV SOLN
4.0000 mg/min | INTRAVENOUS | Status: AC
  Administered 2018-11-12: 7.333 mg/min via INTRAVENOUS

## 2018-11-12 NOTE — Progress Notes (Signed)
Patient's Name: Jesus Crosby.  MRN: [id number]  Referring Provider: Ronnell Freshwater, NP  DOB: [birth year]  PCP: Ronnell Freshwater, NP  DOS: 11/12/2018  Note by: Gillis Santa, MD  Service setting: Ambulatory outpatient  Specialty: Interventional Pain Management  Patient type: Established  Location: [facility] (AMB) Pain Management Facility  Visit type: Interventional Procedure   Primary Reason for Visit: Interventional Pain Management Treatment. CC: Procedure (Lidocaon infusion ) and Foot Pain  Procedure:            Type: Diagnostic Intravenous lidocaine infusion Region: Systemic Level: Upper Extremity IV access Laterality: Please see nurses note.    Position: Supine   Indications: 1. 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 (year old), male patient, seen today for interventional treatment. He  has a past surgical history that includes Intraocular lens insertion (Bilateral); Wrist surgery (Right); Dermabrasion of face; Eye surgery; Pars plana vitrectomy (Right, 09/17/2013); Photocoagulation (Right, 09/17/2013); Gas/fluid exchange (Right, 09/17/2013); Nasal septum surgery (march 2016); Umbilical hernia repair (N/A, 05/07/2015); Colonoscopy with propofol (N/A, 07/30/2015); Esophagogastroduodenoscopy (egd) with propofol (N/A, 07/30/2015); scapholunate ligament reconstruction of right hand (2011); distal radius fracture of right hand (plates, screws, and pins) (2011); gastric sleeve surgery (08/17/2016); and Hip Arthroplasty (Left). Jesus Crosby has a current medication list which includes the following prescription(s): alpha-lipoic acid, alprazolam, amlodipine, aspirin ec, atorvastatin, cystadane, cyanocobalamin, cyclosporine, dicyclomine, folic acid, furosemide, gabapentin, losartan, metoprolol tartrate, pyridoxine, ramelteon, sertraline, and pregabalin, and the following Facility-Administered Medications:  midazolam. His primarily concern today is the Procedure (Lidocaon infusion ) and Foot Pain  Initial Vital Signs:  Pulse/HCG Rate: 90  Temp: 98.7 F (37.1 C) Resp: 18 BP: 138/83 SpO2: 95 %  BMI: Estimated body mass index is 33.87 kg/m as calculated from the following:   Height as of this encounter: 6\' 3"  (1.905 m).   Weight as of this encounter: 271 lb (122.9 kg).  Risk Assessment: Allergies: Reviewed. He is allergic to pineapple flavor; pineapple; amoxicillin; and penicillins.  Allergy Precautions: None required Coagulopathies: Reviewed. None identified.  Blood-thinner therapy: None at this time Active Infection(s): Reviewed. None identified. Jesus Crosby is afebrile  Site Confirmation: Jesus Crosby was asked to confirm the procedure and laterality before marking the site Procedure checklist: Completed Consent: Before the procedure and under the influence of no sedative(s), amnesic(s), or anxiolytics, the patient was informed of the treatment options, risks and possible complications. To fulfill our ethical and legal obligations, as recommended by the American Medical Association's Code of Ethics, I have informed the patient of my clinical impression; the nature and purpose of the treatment or procedure; the risks, benefits, and possible complications of the intervention; the alternatives, including doing nothing; the risk(s) and benefit(s) of the alternative treatment(s) or procedure(s); and the risk(s) and benefit(s) of doing nothing. The patient was provided information about the general risks and possible complications associated with any invasive procedure. These may include, but are not limited to: failure to achieve desired goals; pain; worsening of initial condition; infections; bleeding; organ or nerve damage; allergic reactions; and death. In addition, the patient was informed of those risks and complications associated to this procedure, such as failure to decrease pain; infection;  bleeding; phlebitis; vascular extravasation; tissue necrosis; tenderness at IV access site; skin or nerve damage with subsequent damage to sensory, motor, and/or autonomic systems; worsening of the pain; persistent pain, numbness, and/or weakness of one  or several areas of the body; cardiac dysrhythmias; seizure; stroke; and/or death. Furthermore, the patient was informed of those risks and complications associated with the medications used during the procedure. These include, but are not limited to: allergic reactions (i.e.: anaphylactic or anaphylactoid reactions); cardiac conduction blockade; poisoning; toxicity; CNS depression; cardiovascular depression and collapse; muscle twitching; tonic-clonic seizures; convulsions; loss of consciousness; coma; respiratory depression; arrest; and/or death. Finally, the patient was informed that Medicine is not an exact science; therefore, there is also the possibility of unforeseen or unpredictable risks and/or possible complications that may result in a catastrophic outcome. The patient indicated having understood very clearly. We have given the patient no guarantees and we have made no promises. Enough time was given to the patient to ask questions, all of which were answered to the patient's satisfaction. Jesus Crosby has indicated that he wanted to continue with the procedure. Attestation: I, the ordering provider, attest that I have discussed with the patient the benefits, risks, side-effects, alternatives, likelihood of achieving goals, and potential problems during recovery for the procedure that I have provided informed consent. Date  Time:   Pre-Procedure Preparation:  Monitoring: As per clinic protocol. Respiration, ETCO2, SpO2, BP, heart rate and rhythm monitor placed and checked for adequate function Safety Precautions: Patient was assessed for positional comfort and pressure points before starting the procedure. Time-out: I initiated and conducted the  "Time-out" before starting the procedure, as per protocol. The patient was asked to participate by confirming the accuracy of the "Time Out" information. Verification of the correct person, site, and procedure were performed and confirmed by me, the nursing staff, and the patient. "Time-out" conducted as per Joint Commission's Universal Protocol (UP.01.01.01). Time: 0922  Description of Procedure:          Target Area: Intravenous Approach: Intravenous angiocath approach. Area Prepped: Antecubital Prepping solution: Isopropyl Alcohol (70%) Safety Precautions: Medications properly checked for expiration dates. SDV (single dose vial) medications used.  Dose Calculation: Lidocaine preparation: 2 grams of IV lidocaine in 500 mL of D5W (4 mg/mL) (0.4% Lidocaine) Maximum Lidocaine Dose: 4 mg/kg x 271 lb (122.9 kg) =  496 mg.  Calculated infusion rate: 440 mg divided by 4 mg/mL = 110 mL in 60 minutes (110 mL/hr) Set rate: 110 mL/hr Duration of Infusion: 1 hour  Description of the Procedure: Protocol guidelines were followed. The patient was placed in position. Informed consent was obtained.  He was cautioned to be on the look out for prodromal symptoms of a possible impending seizure, such as: new onset tinnitus; perioral, circumoral, or tongue numbness; or a metallic taste in his mouth, lightheadedness; dizziness; visual or auditory disturbances; disorientation; profound drowsiness; or muscle twitching. An IV was started and the patient's dose calculated as above. The infusion was carried out with a nurse at his side 100% of the time, monitoring his vitals as per protocol. Pre-procedure sedation was started 5-10 minutes before infusion and midazolam was kept at bedside during the entire procedure, along with CPR equipment, as per protocol.  Vitals:   11/12/18 1030 11/12/18 1035 11/12/18 1040 11/12/18 1045  BP: 133/83 136/82 132/79 121/74  Pulse: 79 77 74 72  Resp: 16 16 16 17   Temp:    98.2 F  (36.8 C)  TempSrc:    Oral  SpO2: 95% 96% 93% 94%  Weight:      Height:        Start Time: 0928 hrs. End Time: 1028 hrs. Materials: IV infusion pump Medication(s): IV Lidocaine.  Imaging Guidance:          Type of Imaging Technique: None used Indication(s): N/A Exposure Time: No patient exposure Contrast: None used. Fluoroscopic Guidance: N/A Ultrasound Guidance: N/A Interpretation: N/A  Antibiotic Prophylaxis:   Anti-infectives (From admission, onward)   None     Indication(s): None identified  Post-operative Assessment:  Post-procedure Vital Signs:  Pulse/HCG Rate: 72  Temp: 98.2 F (36.8 C) Resp: 17 BP: 121/74 SpO2: 94 %  EBL: None  Complications: No immediate post-treatment complications observed by team, or reported by patient.  Note: The patient tolerated the entire procedure well. A repeat set of vitals were taken after the procedure and the patient was kept under observation following institutional policy, for this type of procedure. Post-procedural neurological assessment was performed, showing return to baseline, prior to discharge. The patient was provided with post-procedure discharge instructions, including a section on how to identify potential problems. Should any problems arise concerning this procedure, the patient was given instructions to immediately contact us, at any time, without hesitation. In any case, we plan to contact the patient by telephone for a follow-up status report regarding this interventional procedure.  Comments:  No additional relevant information.  Plan of Care    Procedure Orders     LIDOCAINE INFUSION  Medications ordered for procedure: Meds ordered this encounter  Medications  . lidocaine (cardiac) 2000 mg in dextrose 5% 500 mL (4mg /mL) IV infusion    IV Lidocaine 2 grams in 500 mL of D5W. (4 mg/mL) (0.4% Lidocaine)  . midazolam (VERSED) 5 MG/5ML injection 1-2 mg    Make sure Flumazenil is available in the pyxis when  using this medication. If oversedation occurs, administer 0.2 mg IV over 15 sec. If after 45 sec no response, administer 0.2 mg again over 1 min; may repeat at 1 min intervals; not to exceed 4 doses (1 mg)   Medications administered: We administered lidocaine and midazolam.  See the medical record for exact dosing rate.  New Prescriptions   No medications on file   Disposition: Discharge home  Discharge Date & Time: 11/12/2018; 1053 hrs.   Physician-requested Follow-up: Return in about 4 weeks (around 12/10/2018).  Future Appointments  Date Time Provider [place]  11/14/2018  9:00 PM [name], MD [facility] None  11/15/2018  8:45 AM Kendell Bane, NP [facility] None  12/10/2018  8:45 AM Gillis Santa, MD [facility] None   Primary Care Physician: Ronnell Freshwater, NP Location: [facility] Out[facility] Management Facility Note by: Gillis Santa, MD Date: 11/12/2018; Time: 12:00 PM  Disclaimer:  Medicine is not an exact science. The only guarantee in medicine is that nothing is guaranteed. It is important to note that the decision to proceed with this intervention was based on the information collected from the patient. The Data and conclusions were drawn from the patient's questionnaire, the interview, and the physical examination. Because the information was provided in large part by the patient, it cannot be guaranteed that it has not been purposely or unconsciously manipulated. Every effort has been made to obtain as much relevant data as possible for this evaluation. It is important to note that the conclusions that lead to this procedure are derived in large part from the available data. Always take into account that the treatment will also be dependent on availability of resources and existing treatment guidelines, considered by other Pain Management Practitioners as being common knowledge and practice, at the time of the intervention. For Medico-Legal purposes, it is  also  important to point out that variation in procedural techniques and pharmacological choices are the acceptable norm. The indications, contraindications, technique, and results of the above procedure should only be interpreted and judged by a Board-Certified Interventional Pain Specialist with extensive familiarity and expertise in the same exact procedure and technique.

## 2018-11-12 NOTE — Progress Notes (Signed)
Safety precautions to be maintained throughout the outpatient stay will include: orient to surroundings, keep bed in low position, maintain call bell within reach at all times, provide assistance with transfer out of bed and ambulation.  

## 2018-11-12 NOTE — Patient Instructions (Signed)
____________________________________________________________________________________________  Preparing for Procedure with Sedation  Instructions: . Oral Intake: Do not eat or drink anything for at least 8 hours prior to your procedure. . Transportation: Public transportation is not allowed. Bring an adult driver. The driver must be physically present in our waiting room before any procedure can be started. . Physical Assistance: Bring an adult physically capable of assisting you, in the event you need help. This adult should keep you company at home for at least 6 hours after the procedure. . Blood Pressure Medicine: Take your blood pressure medicine with a sip of water the morning of the procedure. . Blood thinners: Notify our staff if you are taking any blood thinners. Depending on which one you take, there will be specific instructions on how and when to stop it. . Diabetics on insulin: Notify the staff so that you can be scheduled 1st case in the morning. If your diabetes requires high dose insulin, take only  of your normal insulin dose the morning of the procedure and notify the staff that you have done so. . Preventing infections: Shower with an antibacterial soap the morning of your procedure. . Build-up your immune system: Take 1000 mg of Vitamin C with every meal (3 times a day) the day prior to your procedure. . Antibiotics: Inform the staff if you have a condition or reason that requires you to take antibiotics before dental procedures. . Pregnancy: If you are pregnant, call and cancel the procedure. . Sickness: If you have a cold, fever, or any active infections, call and cancel the procedure. . Arrival: You must be in the facility at least 30 minutes prior to your scheduled procedure. . Children: Do not bring children with you. . Dress appropriately: Bring dark clothing that you would not mind if they get stained. . Valuables: Do not bring any jewelry or valuables.  Procedure  appointments are reserved for interventional treatments only. . No Prescription Refills. . No medication changes will be discussed during procedure appointments. . No disability issues will be discussed.  Reasons to call and reschedule or cancel your procedure: (Following these recommendations will minimize the risk of a serious complication.) . Surgeries: Avoid having procedures within 2 weeks of any surgery. (Avoid for 2 weeks before or after any surgery). . Flu Shots: Avoid having procedures within 2 weeks of a flu shots or . (Avoid for 2 weeks before or after immunizations). . Barium: Avoid having a procedure within 7-10 days after having had a radiological study involving the use of radiological contrast. (Myelograms, Barium swallow or enema study). . Heart attacks: Avoid any elective procedures or surgeries for the initial 6 months after a "Myocardial Infarction" (Heart Attack). . Blood thinners: It is imperative that you stop these medications before procedures. Let us know if you if you take any blood thinner.  . Infection: Avoid procedures during or within two weeks of an infection (including chest colds or gastrointestinal problems). Symptoms associated with infections include: Localized redness, fever, chills, night sweats or profuse sweating, burning sensation when voiding, cough, congestion, stuffiness, runny nose, sore throat, diarrhea, nausea, vomiting, cold or Flu symptoms, recent or current infections. It is specially important if the infection is over the area that we intend to treat. . Heart and lung problems: Symptoms that may suggest an active cardiopulmonary problem include: cough, chest pain, breathing difficulties or shortness of breath, dizziness, ankle swelling, uncontrolled high or unusually low blood pressure, and/or palpitations. If you are experiencing any of these symptoms, cancel   your procedure and contact your primary care physician for an evaluation.  Remember:   Regular Business hours are:  Monday to Thursday 8:00 AM to 4:00 PM  Provider's Schedule: Francisco Naveira, MD:  Procedure days: Tuesday and Thursday 7:30 AM to 4:00 PM  Bilal Lateef, MD:  Procedure days: Monday and Wednesday 7:30 AM to 4:00 PM ____________________________________________________________________________________________    

## 2018-11-13 ENCOUNTER — Telehealth: Payer: Self-pay

## 2018-11-13 NOTE — Telephone Encounter (Signed)
Post procedure phone call.  LM 

## 2018-11-14 ENCOUNTER — Telehealth: Payer: Self-pay | Admitting: Student in an Organized Health Care Education/Training Program

## 2018-11-14 ENCOUNTER — Encounter: Payer: Self-pay | Admitting: Internal Medicine

## 2018-11-14 NOTE — Telephone Encounter (Signed)
Patient lvmail stating he did get some relief from Lidocaine infusion 24 to 36 hours. Not burning or tingling. Is still having some pretty severe cramping. Would like some suggestions on what he can do to help with this.

## 2018-11-14 NOTE — Telephone Encounter (Signed)
Continue to monitor. If no weakness, it could be parathesias that he is experiencing from the lidocaine infusion.

## 2018-11-14 NOTE — Telephone Encounter (Signed)
Please advise 

## 2018-11-14 NOTE — Telephone Encounter (Signed)
Pt called and said he had a lidocain infusion Monday and that his feet and ankles are stiff and feel weird, he wanted to know if this was normal or if there was anything he could do or take for this?

## 2018-11-15 ENCOUNTER — Other Ambulatory Visit: Payer: Self-pay | Admitting: Student in an Organized Health Care Education/Training Program

## 2018-11-15 ENCOUNTER — Ambulatory Visit: Payer: Self-pay | Admitting: Adult Health

## 2018-11-15 NOTE — Telephone Encounter (Signed)
Patient advised to apply heat to area of cramping.

## 2018-11-15 NOTE — Progress Notes (Unsigned)
ctm LE sx

## 2018-11-19 ENCOUNTER — Telehealth: Payer: Self-pay | Admitting: Gastroenterology

## 2018-11-19 ENCOUNTER — Telehealth: Payer: Self-pay | Admitting: Student in an Organized Health Care Education/Training Program

## 2018-11-19 ENCOUNTER — Encounter: Payer: Self-pay | Admitting: Gastroenterology

## 2018-11-19 NOTE — Telephone Encounter (Signed)
Pt wants to know if he can increase his dose of alpha laporic acid? He said it is helping with the burning pain from neuropathy

## 2018-11-19 NOTE — Telephone Encounter (Signed)
Patient wants states that he is having rectal bleeding wants to discuss with nurse.

## 2018-11-19 NOTE — Telephone Encounter (Signed)
Called patient back to let him know that Dr Holley Raring wants to continue at 1200 mg for now.

## 2018-11-19 NOTE — Telephone Encounter (Signed)
Calls with complaints of anal discomfort and blood on tissue after a bowel movement. Some difficulty moving his bowels stating the stool is "very hard." Bowels move daily, sometimes twice. Ongoing issue and he has called with this before. Appointment made for evaluation.

## 2018-11-19 NOTE — Telephone Encounter (Signed)
No, continue alpha lipoic acid at 1200 mg for now

## 2018-11-19 NOTE — Telephone Encounter (Signed)
Patient was increased from 600 mg to 1200 mg approx 4 weeks ago and it is helping with the burning.  He would like to know if he could increase to see if it will continue to improve.

## 2018-11-21 ENCOUNTER — Other Ambulatory Visit: Payer: Self-pay | Admitting: Internal Medicine

## 2018-11-21 DIAGNOSIS — I10 Essential (primary) hypertension: Secondary | ICD-10-CM

## 2018-11-22 ENCOUNTER — Other Ambulatory Visit: Payer: Self-pay

## 2018-11-22 DIAGNOSIS — I10 Essential (primary) hypertension: Secondary | ICD-10-CM

## 2018-11-22 MED ORDER — AMLODIPINE BESYLATE 5 MG PO TABS: ORAL_TABLET | ORAL | 0 refills | Status: DC

## 2018-12-04 ENCOUNTER — Telehealth: Payer: Self-pay | Admitting: Student in an Organized Health Care Education/Training Program

## 2018-12-04 ENCOUNTER — Ambulatory Visit: Payer: Medicare HMO | Admitting: Physician Assistant

## 2018-12-04 DIAGNOSIS — G629 Polyneuropathy, unspecified: Secondary | ICD-10-CM

## 2018-12-04 NOTE — Telephone Encounter (Signed)
Patient called to cancel scheduled Lidocaine appt. He says he cannot afford  $300 every 2 weeks and it only last couple days. What is next step? SCS and if so what needed to get started ?

## 2018-12-10 ENCOUNTER — Telehealth: Payer: Self-pay | Admitting: Student in an Organized Health Care Education/Training Program

## 2018-12-10 ENCOUNTER — Ambulatory Visit: Payer: Medicare HMO | Admitting: Student in an Organized Health Care Education/Training Program

## 2018-12-10 NOTE — Telephone Encounter (Signed)
Patient will need referral to [facility], [place], [place] # [phone] for 2nd psych evaluation since established psych hx.  Orders Placed This Encounter  Procedures  . Ambulatory referral to Psychology    Referral Priority:   Routine    Referral Type:   Psychiatric    Referral Reason:   Specialty Services Required    Referred to Provider:   Lajoyce Lauber, MD    Requested Specialty:   Psychology    Number of Visits Requested:   1

## 2018-12-10 NOTE — Telephone Encounter (Signed)
Patient notified that this can be discussed at follow-up appointment.

## 2018-12-10 NOTE — Telephone Encounter (Signed)
Pt called and stated that he would like a nurse to call him back and talk about SCS and what the next steps would be to have this done. Pt states that he is having to pay $300 every time he has an infusion and it doesn't help that much and he cant afford to keep paying that every month.

## 2018-12-11 ENCOUNTER — Ambulatory Visit: Payer: Medicare HMO | Admitting: Student in an Organized Health Care Education/Training Program

## 2018-12-12 ENCOUNTER — Telehealth: Payer: Self-pay | Admitting: Student in an Organized Health Care Education/Training Program

## 2018-12-12 NOTE — Telephone Encounter (Signed)
Pt wants to know if he can increase his over the counter medication alpha laporic acid from 1200mg  to 1800mg ?

## 2018-12-12 NOTE — Telephone Encounter (Signed)
Please advise 

## 2018-12-12 NOTE — Telephone Encounter (Signed)
Instructed not to increase dose.

## 2018-12-17 ENCOUNTER — Telehealth: Payer: Self-pay | Admitting: Student in an Organized Health Care Education/Training Program

## 2018-12-17 NOTE — Telephone Encounter (Signed)
Pt left a voicemail stating that he wanted to let Dr. Holley Raring know that he has an appt with Dr. Arlana Pouch on [facility] January 22nd at [place].m. he is the psychologist we referred him to for the SCS.

## 2018-12-26 ENCOUNTER — Telehealth: Payer: Self-pay | Admitting: *Deleted

## 2018-12-26 ENCOUNTER — Telehealth: Payer: Self-pay | Admitting: Student in an Organized Health Care Education/Training Program

## 2018-12-26 NOTE — Telephone Encounter (Signed)
Spoke with patient, states that he had psych appt and he was approved for SCS trial and the paper work would be sent over.  Patient does have f/up appt after psych appt on February 4 with Dr Holley Raring.  Patient is wanting to know if he can come any earlier and if the trial could go ahead and be scheduled.  I told him I would let Dr Holley Raring know but that I think he will need to come for f/up first before any scheduling can be made.

## 2018-12-26 NOTE — Telephone Encounter (Signed)
Called patient back to let him know that Dr Holley Raring would prefer that he keep his f/up appt.  Patient states that he can't come on February 4th d/t transportation issues and would like to see if there is anything earlier with Dr Holley Raring for f/up.  I told him I would send this to scheduling to see if they can reschedule for an earlier appt or one that is more suitable  For patient.

## 2018-12-26 NOTE — Telephone Encounter (Signed)
Pt called and stated that he was seen by Dr. Arlana Pouch today and he told the pt to call and let Dr. Holley Raring know that he has recommended the pt has the SCS and would be sending over records within the next day and pt also requests a nurse to call him back and discuss this.

## 2019-01-01 ENCOUNTER — Telehealth: Payer: Self-pay | Admitting: Gastroenterology

## 2019-01-01 NOTE — Telephone Encounter (Signed)
Pt states that he is still experiencing some IBS sxs despite medication, he also says that sometimes he has substantial bleeding with bms, needs some advise.

## 2019-01-02 ENCOUNTER — Other Ambulatory Visit (INDEPENDENT_AMBULATORY_CARE_PROVIDER_SITE_OTHER): Payer: Medicare HMO

## 2019-01-02 ENCOUNTER — Encounter: Payer: Self-pay | Admitting: Physician Assistant

## 2019-01-02 ENCOUNTER — Ambulatory Visit: Payer: Medicare HMO | Admitting: Physician Assistant

## 2019-01-02 VITALS — BP 124/74 | HR 70 | Ht 75.0 in | Wt 286.4 lb

## 2019-01-02 DIAGNOSIS — K602 Anal fissure, unspecified: Secondary | ICD-10-CM

## 2019-01-02 DIAGNOSIS — K625 Hemorrhage of anus and rectum: Secondary | ICD-10-CM

## 2019-01-02 DIAGNOSIS — R109 Unspecified abdominal pain: Secondary | ICD-10-CM | POA: Diagnosis not present

## 2019-01-02 DIAGNOSIS — Z86010 Personal history of colonic polyps: Secondary | ICD-10-CM | POA: Diagnosis not present

## 2019-01-02 DIAGNOSIS — R197 Diarrhea, unspecified: Secondary | ICD-10-CM

## 2019-01-02 LAB — CBC WITH DIFFERENTIAL/PLATELET
BASOS PCT: 1 % (ref 0.0–3.0)
Basophils Absolute: 0.1 10*3/uL (ref 0.0–0.1)
EOS PCT: 0.8 % (ref 0.0–5.0)
Eosinophils Absolute: 0.1 10*3/uL (ref 0.0–0.7)
HCT: 42.1 % (ref 39.0–52.0)
Hemoglobin: 14.4 g/dL (ref 13.0–17.0)
LYMPHS ABS: 1.7 10*3/uL (ref 0.7–4.0)
Lymphocytes Relative: 21.3 % (ref 12.0–46.0)
MCHC: 34.2 g/dL (ref 30.0–36.0)
MCV: 92.8 fl (ref 78.0–100.0)
MONO ABS: 0.9 10*3/uL (ref 0.1–1.0)
Monocytes Relative: 11.7 % (ref 3.0–12.0)
NEUTROS ABS: 5.1 10*3/uL (ref 1.4–7.7)
NEUTROS PCT: 65.2 % (ref 43.0–77.0)
PLATELETS: 264 10*3/uL (ref 150.0–400.0)
RBC: 4.53 Mil/uL (ref 4.22–5.81)
RDW: 15 % (ref 11.5–15.5)
WBC: 7.8 10*3/uL (ref 4.0–10.5)

## 2019-01-02 LAB — HIGH SENSITIVITY CRP: CRP, High Sensitivity: 1.03 mg/L (ref 0.000–5.000)

## 2019-01-02 LAB — SEDIMENTATION RATE: Sed Rate: 52 mm/h — ABNORMAL HIGH (ref 0–20)

## 2019-01-02 MED ORDER — PEG 3350-KCL-NA BICARB-NACL 420 G PO SOLR: ORAL | 0 refills | Status: DC

## 2019-01-02 MED ORDER — AMBULATORY NON FORMULARY MEDICATION: 2 refills | Status: DC

## 2019-01-02 NOTE — Telephone Encounter (Signed)
Pt states he had quite a bit of BRBPR yesterday when he had a BM. Pt wonders if he has a tear and if he need to have a colon done. Offered pt an appt for today but he declined. Requested appt for next Wednesday. Pt scheduled to see Tye Savoy NP 01/09/19@10 :30am. Pt aware of  appt.

## 2019-01-02 NOTE — Patient Instructions (Addendum)
We sent the colonoscopy prep prescription to [facility].     Your provider has requested that you go to the basement level for lab work before leaving today. Press "B" on the elevator. The lab is located at the first door on the left as you exit the elevator.  Take Miralax 17 grams in 8 oz of water daily.  We are sending a prescription to a compounding pharmacy, [facility], [place], [facility].   Diltiazem 2 A%/ Lidocaine 5 %.  Apply just inside the anus 3 times daily.  They will run your insurance but if this compounded drug is not covered the cash price is $ 41.99 plus tax.   You have been scheduled for a colonoscopy. Please follow written instructions given to you at your visit today.  Please pick up your prep supplies at the pharmacy within the next 1-3 days. If you use inhalers (even only as needed), please bring them with you on the day of your procedure. Normal BMI (Body Mass Index- based on height and weight) is between 19 and 25. Your BMI today is Body mass index is 35.8 kg/m. Marland Kitchen Please consider follow up  regarding your BMI with your Primary Care Provider.

## 2019-01-03 ENCOUNTER — Ambulatory Visit
Payer: Medicare HMO | Attending: Student in an Organized Health Care Education/Training Program | Admitting: Student in an Organized Health Care Education/Training Program

## 2019-01-03 ENCOUNTER — Encounter: Payer: Self-pay | Admitting: Student in an Organized Health Care Education/Training Program

## 2019-01-03 ENCOUNTER — Other Ambulatory Visit: Payer: Self-pay

## 2019-01-03 ENCOUNTER — Telehealth: Payer: Self-pay | Admitting: Physician Assistant

## 2019-01-03 ENCOUNTER — Encounter: Payer: Self-pay | Admitting: Physician Assistant

## 2019-01-03 VITALS — BP 113/80 | HR 62 | Temp 98.1°F | Resp 18 | Ht 75.0 in | Wt 285.0 lb

## 2019-01-03 DIAGNOSIS — G5772 Causalgia of left lower limb: Secondary | ICD-10-CM | POA: Insufficient documentation

## 2019-01-03 DIAGNOSIS — G5793 Unspecified mononeuropathy of bilateral lower limbs: Secondary | ICD-10-CM | POA: Insufficient documentation

## 2019-01-03 DIAGNOSIS — G5771 Causalgia of right lower limb: Secondary | ICD-10-CM | POA: Diagnosis present

## 2019-01-03 DIAGNOSIS — G5773 Causalgia of bilateral lower limbs: Secondary | ICD-10-CM | POA: Diagnosis present

## 2019-01-03 DIAGNOSIS — G629 Polyneuropathy, unspecified: Secondary | ICD-10-CM | POA: Diagnosis present

## 2019-01-03 NOTE — Patient Instructions (Addendum)
[employer] SCS trial   Patient has been evaluated by psychologist, Shanon Brow [place].  Have requested records.  Psychologist to fax over Monday, February 3.  Wash with chlorhexidine cleaning solution the night before procedure as well as the morning of procedure

## 2019-01-03 NOTE — Progress Notes (Signed)
Subjective:    Patient ID: Jesus Glassing., male    DOB: 1966-09-22, 53 y.o.   MRN: [id number]  HPI Jesus Crosby is a pleasant 53 year old white male, established with Dr. Ardis Hughs, who was last seen in the office by myself in October 2019 with complaints of fairly chronic diarrhea with an acute exacerbation and associated generalized abdominal discomfort.  At that time he had had some very recent med changes including initiation of Cymbalta and tapering dose of Lyrica.  He felt that his symptoms started immediately after he had started the Cymbalta which he stopped.  It was felt symptoms were likely Acacian induced. He had undergone colonoscopy in August 2016 with finding of mild nonspecific inflammation from the rectum to 50 cm, biopsy showed no clear inflammation.  He had one sessile polyp removed which was adenomatous, no internal hemorrhoids noted. He also had EGD in August 2016 which was normal. Patient is status post gastric sleeve in 2017, has history of EtOH abuse which he says he has discontinued since he was seen in the office last fall also with chronic anxiety and OCD, severe peripheral neuropathy, prior CVA and history of homocystinuria.  Pt comes back in today with concerns about rectal bleeding.  He says he has been noticing small amounts of blood with bowel movements, a few times per week over the past several months.  At times he has been having hard stools.  He continues to have intermittent diarrhea. Today he developed acute abdominal cramping blood by loose stools containing a lot of bright red blood in the commode.  He did not have any further episodes during the night but this morning had a loose bowel movement with blood mixed in.  He says he was alarmed by the amount of blood that he saw yesterday.  He is also noticed some discomfort on the left side of his rectum.  Patient mentions that he has been approved for a spinal stimulator and anticipates having the file device placed in the  very near future. Patient has not had any recent medication changes, no recent antibiotics.  Review of Systems Pertinent positive and negative review of systems were noted in the above HPI section.  All other review of systems was otherwise negative.  Outpatient Encounter Medications as of 01/02/2019  Medication Sig  . Alpha-Lipoic Acid 200 MG CAPS Take by mouth. Take 6 cap daily. (1200mg /day)  . ALPRAZolam (XANAX) 0.5 MG tablet Take 0.5 mg by mouth 3 (three) times daily.  Marland Kitchen amLODipine (NORVASC) 5 MG tablet TAKE 1 TABLET(5 MG) BY MOUTH TWICE DAILY  . aspirin EC 81 MG tablet Take 81 mg by mouth daily.  Marland Kitchen atorvastatin (LIPITOR) 10 MG tablet Take 1 tablet (10 mg total) by mouth daily.  . Betaine (CYSTADANE) POWD Take 8 Scoops by mouth 3 (three) times daily.  . Cyanocobalamin (VITAMIN B 12 PO) Take 1,000 mcg by mouth at bedtime.   . cycloSPORINE (RESTASIS) 0.05 % ophthalmic emulsion Place 1 drop into both eyes 2 (two) times daily.  Marland Kitchen dicyclomine (BENTYL) 10 MG capsule Take 1 capsule (10 mg total) by mouth every 6 (six) hours as needed for spasms.  . folic acid (FOLVITE) 681 MCG tablet Take 1,200 mcg by mouth at bedtime.   . furosemide (LASIX) 20 MG tablet Take 1 tablet (20 mg total) by mouth every morning.  . gabapentin (NEURONTIN) 600 MG tablet Take 1,200 mg by mouth 2 (two) times daily.   Marland Kitchen losartan (COZAAR) 50 MG tablet TAKE  1/2 TABLET BY MOUTH EVERY DAY  . metoprolol tartrate (LOPRESSOR) 50 MG tablet Take 1.5 tablets (75 mg total) by mouth 2 (two) times daily.  . pregabalin (LYRICA) 25 MG capsule Take 1 capsule (25 mg total) by mouth 2 (two) times daily. (Patient not taking: Reported on 01/03/2019)  . pyridOXINE (VITAMIN B-6) 100 MG tablet Take 500 mg by mouth at bedtime.   . ramelteon (ROZEREM) 8 MG tablet Take 1 tablet (8 mg total) by mouth at bedtime.  . sertraline (ZOLOFT) 100 MG tablet Take 200 mg by mouth daily.   . AMBULATORY NON FORMULARY MEDICATION Medication Name:  Diltiazem 2 %/  Lidocaine 5 % Apply pea size amount 1 inch inside the anus 3 times daily. For 3 weeks. Or until symptoms resolve.  . polyethylene glycol-electrolytes (NULYTELY/GOLYTELY) 420 g solution Take as directed for colonoscopy prep. (Patient not taking: Reported on 01/03/2019)   No facility-administered encounter medications on file as of 01/02/2019.    Allergies  Allergen Reactions  . Pineapple Flavor Swelling    Lip swell  . Pineapple Swelling    Lips swelling  . Amoxicillin Nausea And Vomiting    REACTION: Nausea and vomiting  . Penicillins    Patient Active Problem List   Diagnosis Date Noted  . Aphasia 10/04/2018  . Arthritis 04/26/2018  . Arthritis of knee 04/26/2018  . Bilateral cataracts 04/26/2018  . Class 1 obesity 04/26/2018  . Disorder of lens 04/26/2018  . Exomphalos 04/26/2018  . OSA on CPAP 04/26/2018  . Prediabetes 04/26/2018  . History of total hip arthroplasty 07/05/2017  . Spinal stenosis of lumbar region 04/25/2017  . Trochanteric bursitis 02/27/2017  . S/P laparoscopic sleeve gastrectomy 08/17/2016  . Alcohol-induced mood disorder ([place])   . OCD (obsessive compulsive disorder)   . Paresthesia 06/10/2015  . Diplopia 08/15/2014  . Sinus tachycardia 08/15/2014  . CVA (cerebral infarction) 08/14/2014  . Neuropathy 04/04/2014  . Intraocular lens dislocation 09/12/2013  . DDD (degenerative disc disease), lumbosacral 07/04/2013  . HTN (hypertension) 07/04/2013  . Major depressive disorder, single episode 07/03/2009  . 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 HYPERLIPIDEMIA 05/28/2008  . ANXIETY 05/28/2008  . OBSESSIVE-COMPULSIVE DISORDER 05/28/2008   Social History   Socioeconomic History  . Marital status: Single    Spouse name: n/a  . Number of children: 0  . Years of education: college  . Highest education level: Not on file  Occupational History  . Occupation: unemployed/disabled    Comment: Lexicographer  Social  Needs  . Financial resource strain: Not hard at all  . Food insecurity:    Worry: Never true    Inability: Never true  . Transportation needs:    Medical: Yes    Non-medical: Yes  Tobacco Use  . Smoking status: Never Smoker  . Smokeless tobacco: Never Used  Substance and Sexual Activity  . Alcohol use: No    Alcohol/week: 0.0 standard drinks  . Drug use: No  . Sexual activity: Not on file  Lifestyle  . Physical activity:    Days per week: 0 days    Minutes per session: 0 min  . Stress: Not at all  Relationships  . Social connections:    Talks on phone: Once a week    Gets together: Once a week    Attends religious service: Never    Active member of club or organization: No    Attends meetings of clubs or organizations: Never    Relationship status:  Never married  . Intimate partner violence:    Fear of current or ex partner: No    Emotionally abused: No    Physically abused: No    Forced sexual activity: No  Other Topics Concern  . Not on file  Social History Narrative   Lives alone. Parents live nearby.  Applying for disability (OCD) due to wrist injury, hearing upcoming.      Patient drinks 4-5 cups of caffeine daily.   Patient is right handed.    Mr. Drummer family history includes Breast cancer in his mother; Hypertension in his father and mother; Stroke in his maternal grandfather, maternal grandmother, paternal grandfather, paternal grandmother, and another family member.      Objective:    Vitals:   01/02/19 1453  BP: 124/74  Pulse: 70    Physical Exam; well-developed older white male in no acute distress, pleasant, height 6 foot 3, weight 286, BMI 35.8.  HEENT; nontraumatic normocephalic EOMI PERRLA sclera anicteric, oral mucosa moist, Cardiovascular; regular rate and rhythm with S1-S2.  Pulmonary; clear bilaterally, Abdomen; soft, obese, nondistended bowel sounds are present, no palpable mass or hepatosplenomegaly, he is tender, rather generally right  and left lower quadrants no rebound.  Rectal ;exam no external lesions noted, on digital exam he does have a fissure on the left at about the 2 o'clock position, there is no gross blood on the examining glove and no stool in the rectum mucus is heme positive,.  Extremities; no clubbing cyanosis or edema skin warm and dry, Neuropsych ;alert and oriented, grossly nonfocal mood and affect appropriate       Assessment & Plan:   #14 53 year old white male with acute left anal fissure which accounts for the bright red blood he has noticed over the past 24 hours. However patient also had abdominal cramping yesterday followed by loose stools has some generalized abdominal discomfort.  He also has intermittent diarrhea which is been chronic, and has noted intermittent blood in his stools Over the past Several Months. Rule out underlying IBD, occult colonic lesion.,  Doubt current episode represents segmental ischemic colitis but cannot rule out.  #2 tree of adenomatous colon polyps-last colonoscopy August 2016 #3 status post sleeve gastrectomy 4.  Obesity 5.  OCD and chronic anxiety 6.  Prior history of EtOH abuse-inactive 7.  Obstructive sleep apnea 8.  Prior CVA #9 chronic neuropathy-awaiting spinal cord stimulator  Plan; patient will be scheduled for colonoscopy with Dr. Ardis Hughs.  Procedure was discussed in detail with the patient including indications risks and benefits and he is agreeable to proceed. He is advised to start MiraLAX 17 g in 8 ounces of water daily to avoid hard stools, decrease this to half dose per day as needed. Start compounded lidocaine 5% mixed with diltiazem 2% apply 1 inch inside the anus 3 times daily x4 to 6 weeks until symptoms have completely resolved. To new dicyclomine 10 mg 2-3 times daily as needed for cramping CBC with differential, sed rate and CRP.  Sylvanna Burggraf S Jakia Kennebrew PA-C 01/03/2019   Cc: Ronnell Freshwater, NP

## 2019-01-03 NOTE — Progress Notes (Signed)
Patient's Name: Jesus Crosby.  MRN: [id number]  Referring Provider: Ronnell Freshwater, NP  DOB: [birth year]  PCP: Ronnell Freshwater, NP  DOS: 01/03/2019  Note by: Gillis Santa, MD  Service setting: Ambulatory outpatient  Specialty: Interventional Pain Management  Location: [facility] (AMB) Pain Management Facility    Patient type: Established   Primary Reason(s) for Visit: Evaluation of chronic illnesses with exacerbation, or progression (Level of risk: moderate) CC: Hand Pain (bilateral) and Foot Pain (bilateral)  HPI  Jesus Crosby is a 53 y.o. year old, male patient, who comes today for a follow-up evaluation. 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[place]); HYPERLIPIDEMIA; ANXIETY; OBSESSIVE-COMPULSIVE DISORDER; DDD (degenerative disc disease), lumbosacral; HTN (hypertension); Intraocular lens dislocation; CVA (cerebral infarction); Diplopia; Sinus tachycardia; Paresthesia; Alcohol-induced mood disorder ([place]); OCD (obsessive compulsive disorder); Arthritis; Arthritis of knee; Bilateral cataracts; Class 1 obesity; Disorder of lens; Exomphalos; History of total hip arthroplasty; Major depressive disorder, single episode; Neuropathy; OSA on CPAP; Prediabetes; S/P laparoscopic sleeve gastrectomy; Spinal stenosis of lumbar region; Trochanteric bursitis; Aphasia; Complex regional pain syndrome type 2 of both lower extremities; and Neuropathic pain of both legs on their problem list. Jesus Crosby was last seen on 12/26/2018. His primarily concern today is the Hand Pain (bilateral) and Foot Pain (bilateral)  Pain Assessment: Location: Right, Left(right is worse) Foot Radiating: right is worse; pain up to calves bilaterally, tops of ankles feel stiff Onset: More than a month ago Duration: Chronic pain Quality: Tingling, Burning, Constant Severity: 9 /10 (subjective, self-reported pain score)  Note: Reported level is inconsistent with clinical observations. Clinically the patient looks like a 3/10 A 3/10  is viewed as "Moderate" and described as significantly interfering with activities of daily living (ADL). It becomes difficult to feed, bathe, get dressed, get on and off the toilet or to perform personal hygiene functions. Difficult to get in and out of bed or a chair without assistance. Very distracting. With effort, it can be ignored when deeply involved in activities. Information on the proper use of the pain scale provided to the patient today. When using our objective Pain Scale, levels between 6 and 10/10 are said to belong in an emergency room, as it progressively worsens from a 6/10, described as severely limiting, requiring emergency care not usually available at an outpatient pain management facility. At a 6/10 level, communication becomes difficult and requires great effort. Assistance to reach the emergency department may be required. Facial flushing and profuse sweating along with potentially dangerous increases in heart rate and blood pressure will be evident. Effect on ADL: limits daily activities Timing: Constant Modifying factors: sitting with feet elevated, using pillows BP: 113/80  HR: 62  Further details on both, my assessment(s), as well as the proposed treatment plan, please see below.  Laboratory Chemistry  Inflammation Markers (CRP: Acute Phase) (ESR: Chronic Phase) Lab Results  Component Value Date   CRP 0.2 (L) 09/21/2018   ESRSEDRATE 52 (H) 01/02/2019                         Rheumatology Markers Lab Results  Component Value Date   ANA NEG 08/03/2015                        Renal Function Markers Lab Results  Component Value Date   BUN 10 10/06/2018   CREATININE 1.02 10/06/2018   BCR 13 08/29/2018   GFRAA >60 10/06/2018   GFRNONAA >60  10/06/2018                             Hepatic Function Markers Lab Results  Component Value Date   AST 57 (H) 10/04/2018   ALT 56 (H) 10/04/2018   ALBUMIN 4.7 10/04/2018   ALKPHOS 54 10/04/2018   HCVAB NEGATIVE  08/03/2015                        Electrolytes Lab Results  Component Value Date   NA 138 10/06/2018   K 4.4 10/06/2018   CL 100 10/06/2018   CALCIUM 9.1 10/06/2018                        Neuropathy Markers Lab Results  Component Value Date   HGBA1C 5.1 10/04/2018   HIV Non Reactive 10/05/2018                        CNS Tests No results found for: COLORCSF, APPEARCSF, RBCCOUNTCSF, WBCCSF, POLYSCSF, LYMPHSCSF, EOSCSF, PROTEINCSF, GLUCCSF, JCVIRUS, CSFOLI, IGGCSF                      Bone Pathology Markers No results found for: VD25OH, [id number], [id number], [id number], 25OHVITD1, 25OHVITD2, 25OHVITD3, TESTOFREE, TESTOSTERONE                       Coagulation Parameters Lab Results  Component Value Date   INR 1.13 10/04/2018   LABPROT 14.4 10/04/2018   APTT 30 10/04/2018   PLT 264.0 01/02/2019   DDIMER 0.28 08/15/2014                        Cardiovascular Markers Lab Results  Component Value Date   CKTOTAL 77 10/06/2018   TROPONINI <0.03 10/04/2018   HGB 14.4 01/02/2019   HCT 42.1 01/02/2019                         CA Markers No results found for: CEA, CA125, LABCA2                      Note: Lab results reviewed.  Recent Diagnostic Imaging Review    Thoracic DG Myelogram views: No results found for this or any previous visit.  Lumbosacral Imaging: Lumbar MR wo contrast:  Results for orders placed during the hospital encounter of 01/12/05  MR Lumbar Spine Wo Contrast   Narrative Clinical Data: Low back pain radiating down the left leg.   MRI OF THE LUMBAR SPINE WITHOUT CONTRAST - 01/12/05:  No prior studies.   Technique: Multiplanar and multisequence images were obtained through the lumbar spine. For purposes of this dictation, I assume that L5 is the lowest segmental lumbar type non-rib bearing vertebra.   Findings: There is intervertebral disc desiccation at L2-3, L4-5 and at L5-S1. There is loss of intervertebral disc height at L5-S1 with Modic type II  degenerative end plate changes. The conus medullaris is at the L1-2 level and appears unremarkable.   Additional findings at individual levels are as follows:  L2-3: Broad-based disc bulge is present at this level without significant central or foraminal stenosis.   L3-4: Minimal disc bulge L4-5. Broad-based disc bulge is present along with facet overgrowth and ligamentum flavum redundancy. There is mild left subarticular lateral recess  stenosis.   L5-S1: Broad-based disc bulge is present causing mild right and moderate left foraminal stenosis and also slightly posteriorly displacing the left S1 nerve root in the lateral recess.   IMPRESSION:   Bilateral foraminal stenosis at L5-S1, left greater than right, due to foraminal component of broad-based disc bulge and facet overgrowth.   Mild subarticular lateral recess stenosis on the left at L4-5 and borderline left subarticular lateral recess stenosis at L5-S1.  Provider: Loni Beckwith    Lumbar MR w/wo contrast:  Results for orders placed during the hospital encounter of 05/23/13  MR Lumbar Spine W Wo Contrast   Narrative *RADIOLOGY REPORT*  Clinical Data: Low back and right leg pain with bladder symptoms.  MRI LUMBAR SPINE WITHOUT AND WITH CONTRAST  Technique:  Multiplanar and multiecho pulse sequences of the lumbar spine were obtained without and with intravenous contrast.  Contrast: 40m MULTIHANCE GADOBENATE DIMEGLUMINE 529 MG/ML IV SOLN  Comparison: Lumbar MRI January 12, 2005.  Lumbar spine radiographs 05/23/2013.  Findings: There is chronic disc and endplate degeneration at L5-S1 which appears mildly progressive.  There is no evidence of acute fracture or pars defect.  The alignment is anatomic.  The conus medullaris extends to the L2 level and appears normal. There is no abnormal intradural enhancement.  There are no paraspinal abnormalities.  There is minimal disc desiccation at the L1-L2 and L2-L3 levels. However, from  T12-T1 through L3-L4, there is no disc herniation, spinal stenosis or nerve root encroachment.  L4-L5:  Mild annular disc bulging is stable.  There is mildly progressive facet and ligamentous hypertrophy with small bilateral facet joint effusions.  Minimal narrowing of the lateral recesses is stable.  There is no foraminal compromise or exiting nerve root encroachment.  L5-S1: There is chronic degenerative disc disease with progressive loss of disc height, annular bulging and paraspinal osteophytes. Mild facet and ligamentous hypertrophy bilaterally is unchanged. Likewise, the mild right and moderate left foraminal stenosis is similar to the prior examination.  There is possible encroachment on either exiting L5 nerve root.  No S1 nerve root displacement is identified.  IMPRESSION:  1.  Mildly progressive chronic degenerative disc disease at L5-S1 with associated chronic left greater than right foraminal stenosis. There is possible chronic L5 nerve root encroachment. 2.  No other significant spinal stenosis or nerve root encroachment. 3.  No acute findings are identified to explain the patient's radicular symptoms. 4.  Mildly progressive facet joint effusions bilaterally at L4-L5.   Original Report Authenticated By: WRichardean Sale M.D.     Results for orders placed during the hospital encounter of 05/23/13  DG Lumbar Spine 2-3 Views   Narrative *RADIOLOGY REPORT*  Clinical Data: Low back pain.  LUMBAR SPINE - 2-3 VIEW  Comparison: Lumbar spine MRI 01/12/2005.  Findings: Three views of the lumbar spine demonstrate no definite acute displaced fracture or compression type fracture.  Alignment is anatomic.  Multilevel degenerative disc disease and facet arthropathy, most severe at L5-S1.  IMPRESSION: 1.  No acute radiographic abnormality of the lumbar spine. 2.  Multilevel degenerative disc disease and lumbar spondylosis, most severe at L5-S1.   Original Report  Authenticated By: DVinnie Langton M.D.     Results for orders placed during the hospital encounter of 07/22/15  DG Knee Complete 4 Views Right   Narrative CLINICAL DATA:  Bilateral knee pain.  Fall.  Initial evaluation .  EXAM: RIGHT KNEE - COMPLETE 4+ VIEW  COMPARISON:  None.  FINDINGS: Small knee joint effusion  cannot be excluded. No acute bony or joint abnormality identified. No evidence of fracture or dislocation.  IMPRESSION: Small knee joint effusion cannot be excluded. No acute bony or joint abnormality identified.   Electronically Signed   By: Marcello Moores  Register   On: 07/22/2015 14:36    Knee-L DG 4 views:  Results for orders placed during the hospital encounter of 07/22/15  DG Knee Complete 4 Views Left   Narrative CLINICAL DATA:  Bilateral knee pain for 1 week after a fall 1 week ago, landing on both knees, initial encounter.  EXAM: LEFT KNEE - COMPLETE 4+ VIEW  COMPARISON:  11/02/2014.  FINDINGS: No joint effusion or fracture.  Trace patellofemoral osteophytosis.  IMPRESSION: 1. No acute findings. 2. Trace patellofemoral osteophytosis.   Electronically Signed   By: Lorin Picket M.D.   On: 07/22/2015 14:35      Hand Imaging: Hand-R DG Complete:  Results for orders placed during the hospital encounter of 03/06/14  DG Hand Complete Right   Narrative CLINICAL DATA:  Status post fall on right hand; right posterior lateral hand and thumb pain.  EXAM: RIGHT HAND - COMPLETE 3+ VIEW  COMPARISON:  Right wrist radiographs performed 03/05/2014  FINDINGS: There is no evidence of fracture or dislocation. The joint spaces are preserved; the soft tissues are unremarkable in appearance.  Mild apparent widening of the scapholunate interspace is better characterized on recent wrist radiograph. A plate and screws are again noted along the distal radius. The carpal rows are otherwise grossly intact.  IMPRESSION: No evidence of fracture or  dislocation.   Electronically Signed   By: Garald Balding M.D.   On: 03/06/2014 22:16     Complexity Note: Imaging results reviewed. Results shared with Mr. Bouie, using [employer].                         Meds   Current Outpatient Medications:  .  Alpha-Lipoic Acid 200 MG CAPS, Take by mouth. Take 6 cap daily. ('1200mg'$ /day), Disp: , Rfl:  .  ALPRAZolam (XANAX) 0.5 MG tablet, Take 0.5 mg by mouth 3 (three) times daily., Disp: , Rfl:  .  AMBULATORY NON FORMULARY MEDICATION, Medication Name:  Diltiazem 2 %/ Lidocaine 5 % Apply pea size amount 1 inch inside the anus 3 times daily. For 3 weeks. Or until symptoms resolve., Disp: 30 g, Rfl: 2 .  amLODipine (NORVASC) 5 MG tablet, TAKE 1 TABLET(5 MG) BY MOUTH TWICE DAILY, Disp: 180 tablet, Rfl: 0 .  aspirin EC 81 MG tablet, Take 81 mg by mouth daily., Disp: , Rfl:  .  atorvastatin (LIPITOR) 10 MG tablet, Take 1 tablet (10 mg total) by mouth daily., Disp: 90 tablet, Rfl: 3 .  Betaine (CYSTADANE) POWD, Take 8 Scoops by mouth 3 (three) times daily., Disp: , Rfl:  .  Cyanocobalamin (VITAMIN B 12 PO), Take 1,000 mcg by mouth at bedtime. , Disp: , Rfl:  .  cycloSPORINE (RESTASIS) 0.05 % ophthalmic emulsion, Place 1 drop into both eyes 2 (two) times daily., Disp: , Rfl:  .  dicyclomine (BENTYL) 10 MG capsule, Take 1 capsule (10 mg total) by mouth every 6 (six) hours as needed for spasms., Disp: 90 capsule, Rfl: 2 .  folic acid (FOLVITE) 850 MCG tablet, Take 1,200 mcg by mouth at bedtime. , Disp: , Rfl:  .  furosemide (LASIX) 20 MG tablet, Take 1 tablet (20 mg total) by mouth every morning., Disp: 30 tablet, Rfl: 3 .  gabapentin (NEURONTIN) 600 MG tablet, Take 1,200 mg by mouth 2 (two) times daily. , Disp: , Rfl:  .  losartan (COZAAR) 50 MG tablet, TAKE 1/2 TABLET BY MOUTH EVERY DAY, Disp: 45 tablet, Rfl: 3 .  metoprolol tartrate (LOPRESSOR) 50 MG tablet, Take 1.5 tablets (75 mg total) by mouth 2 (two) times daily., Disp: 270 tablet, Rfl: 0 .   pyridOXINE (VITAMIN B-6) 100 MG tablet, Take 500 mg by mouth at bedtime. , Disp: , Rfl:  .  ramelteon (ROZEREM) 8 MG tablet, Take 1 tablet (8 mg total) by mouth at bedtime., Disp: 30 tablet, Rfl: 0 .  sertraline (ZOLOFT) 100 MG tablet, Take 200 mg by mouth daily. , Disp: , Rfl:   ROS  Constitutional: Denies any fever or chills Gastrointestinal: No reported hemesis, hematochezia, vomiting, or acute GI distress Musculoskeletal: Denies any acute onset joint swelling, redness, loss of ROM, or weakness Neurological: No reported episodes of acute onset apraxia, aphasia, dysarthria, agnosia, amnesia, paralysis, loss of coordination, or loss of consciousness  Allergies  Mr. Strassman is allergic to pineapple flavor; pineapple; amoxicillin; and penicillins.  [place]  Drug: Mr. Arneson  reports no history of drug use. Alcohol:  reports no history of alcohol use. Tobacco:  reports that he has never smoked. He has never used smokeless tobacco. 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 Surgical: Mr. Urton  has a past surgical history that includes Intraocular lens insertion (Bilateral); Wrist surgery (Right); Dermabrasion of face; Eye surgery; Pars plana vitrectomy (Right, 09/17/2013); Photocoagulation (Right, 09/17/2013); Gas/fluid exchange (Right, 09/17/2013); Nasal septum surgery (march 2016); Umbilical hernia repair (N/A, 05/07/2015); Colonoscopy with propofol (N/A, 07/30/2015); Esophagogastroduodenoscopy (egd) with propofol (N/A, 07/30/2015); scapholunate ligament reconstruction of right hand  (2011); distal radius fracture of right hand (plates, screws, and pins) (2011); gastric sleeve surgery (08/17/2016); and Hip Arthroplasty (Left). Family: family history includes Breast cancer in his mother; Hypertension in his father and mother; Stroke in his maternal grandfather, maternal grandmother, paternal grandfather, paternal grandmother, and another family member.  Constitutional Exam  General appearance: Well nourished, well developed, and well hydrated. In no apparent acute distress Vitals:   01/03/19 1033  BP: 113/80  Pulse: 62  Resp: 18  Temp: 98.1 F (36.7 C)  TempSrc: Oral  SpO2: 97%  Weight: 285 lb (129.3 kg)  Height: '6\' 3"'$  (1.905 m)   BMI Assessment: Estimated body mass index is 35.62 kg/m as calculated from the following:   Height as of this encounter: '6\' 3"'$  (1.905 m).   Weight as of this encounter: 285 lb (129.3 kg).  BMI interpretation table: BMI level Category Range association with higher incidence of chronic pain  <18 kg/m2 Underweight   18.5-24.9 kg/m2 Ideal body weight   25-29.9 kg/m2 Overweight Increased incidence by 20%  30-34.9 kg/m2 Obese (Class I) Increased incidence by 68%  35-39.9 kg/m2 Severe obesity (Class II) Increased incidence by 136%  >40 kg/m2 Extreme obesity (Class III) Increased incidence by 254%   Patient's current BMI Ideal Body weight  Body mass index is 35.62 kg/m. Ideal body weight: 84.5 kg (186 lb 4.6 oz) Adjusted ideal body weight: 102.4 kg (225 lb 12.4 oz)   BMI Readings from Last 4 Encounters:  01/03/19 35.62 kg/m  01/02/19 35.80 kg/m  11/12/18 33.87 kg/m  10/31/18 34.27 kg/m   Wt Readings from Last 4 Encounters:  01/03/19 285 lb (129.3 kg)  01/02/19 286 lb 7 oz (129.9 kg)  11/12/18 271 lb (122.9 kg)  10/31/18 274 lb 3.2 oz (124.4 kg)  Psych/Mental status: Alert, oriented x 3 (person, place, & time)       Eyes: PERLA Respiratory: No evidence of acute respiratory distress  Cervical Spine Area Exam  Skin & Axial  Inspection: No masses, redness, edema, swelling, or associated skin lesions Alignment: Symmetrical Functional ROM: Unrestricted ROM      Stability: No instability detected Muscle Tone/Strength: Functionally intact. No obvious neuro-muscular anomalies detected. Sensory (Neurological): Unimpaired Palpation: No palpable anomalies              Upper Extremity (UE) Exam    Side: Right upper extremity  Side: Left upper extremity  Skin & Extremity Inspection: Skin color, temperature, and hair growth are WNL. No peripheral edema or cyanosis. No masses, redness, swelling, asymmetry, or associated skin lesions. No contractures.  Skin & Extremity Inspection: Skin color, temperature, and hair growth are WNL. No peripheral edema or cyanosis. No masses, redness, swelling, asymmetry, or associated skin lesions. No contractures.  Functional ROM: Unrestricted ROM          Functional ROM: Unrestricted ROM          Muscle Tone/Strength: Functionally intact. No obvious neuro-muscular anomalies detected.  Muscle Tone/Strength: Functionally intact. No obvious neuro-muscular anomalies detected.  Sensory (Neurological): Unimpaired          Sensory (Neurological): Unimpaired          Palpation: No palpable anomalies              Palpation: No palpable anomalies              Provocative Test(s):  Phalen's test: deferred Tinel's test: deferred Apley's scratch test (touch opposite shoulder):  Action 1 (Across chest): deferred Action 2 (Overhead): deferred Action 3 (LB reach): deferred   Provocative Test(s):  Phalen's test: deferred Tinel's test: deferred Apley's scratch test (touch opposite shoulder):  Action 1 (Across chest): deferred Action 2 (Overhead): deferred Action 3 (LB reach): deferred    Thoracic Spine Area Exam  Skin & Axial Inspection: No masses, redness, or swelling Alignment: Symmetrical Functional ROM: Unrestricted ROM Stability: No instability detected Muscle Tone/Strength: Functionally  intact. No obvious neuro-muscular anomalies detected. Sensory (Neurological): Unimpaired Muscle strength & Tone: No palpable anomalies  Lumbar Spine Area Exam  Skin & Axial Inspection: No masses, redness, or swelling Alignment: Symmetrical Functional ROM: Unrestricted ROM       Stability: No instability detected Muscle Tone/Strength: Functionally intact. No obvious neuro-muscular anomalies detected. Sensory (Neurological): Unimpaired Palpation: No palpable anomalies       Provocative Tests: Hyperextension/rotation test: deferred today       Lumbar quadrant test (Kemp's test): deferred today       Lateral bending test: deferred today       Patrick's Maneuver: deferred today                   FABER* test: deferred today                   S-I anterior distraction/compression test: deferred today         S-I lateral compression test: deferred today         S-I Thigh-thrust test: deferred today  S-I Gaenslen's test: deferred today         *(Flexion, ABduction and External Rotation)  Gait & Posture Assessment  Ambulation: Unassisted Gait: Relatively normal for age and body habitus Posture: WNL   Lower Extremity Exam    Side: Right lower extremity  Side: Left lower extremity  Stability: No instability observed          Stability: No instability observed          Skin & Extremity Inspection: Skin color, temperature, and hair growth are WNL. No peripheral edema or cyanosis. No masses, redness, swelling, asymmetry, or associated skin lesions. No contractures.  Skin & Extremity Inspection: Skin color, temperature, and hair growth are WNL. No peripheral edema or cyanosis. No masses, redness, swelling, asymmetry, or associated skin lesions. No contractures.  Functional ROM: Pain restricted ROM for all joints of the lower extremity          Functional ROM: Pain restricted ROM for all joints of the lower extremity          Muscle Tone/Strength: Functionally intact. No obvious  neuro-muscular anomalies detected.  Muscle Tone/Strength: Functionally intact. No obvious neuro-muscular anomalies detected.  Sensory (Neurological): Neuropathic pain pattern top of foot (L5)  Sensory (Neurological): Neuropathic pain pattern top of foot (L5)  DTR: Patellar: 1+: trace Achilles: 1+: trace Plantar: deferred today  DTR: Patellar: 1+: trace Achilles: 1+: trace Plantar: deferred today  Palpation: No palpable anomalies  Palpation: No palpable anomalies   Assessment  Primary Diagnosis & Pertinent Problem List: The primary encounter diagnosis was Causalgia of both lower extremities. 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  Status Diagnosis  Worsening Worsening Worsening 1. Causalgia of both lower extremities   2. Complex regional pain syndrome type 2 of both lower extremities   3. Neuropathic pain of both legs   4. 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 Patient is tried various neuropathic medications including Lyrica 150 mg twice daily which resulted in blurry vision. Patient has a history of multiple eye surgeries including lens implants and replacements. Patient is also tried Topamax, Cymbalta which resulted in side effects of nausea and were primarily ineffective. He does find benefit with gabapentin 1200 mg twice daily.  Patient is also tried lidocaine infusions for his refractory neuropathy.  Patient's EMG studies show severe axonal sensorimotor neuropathy in the legs, right median neuropathy at the wrist, right ulnar neuropathy.  Lower  extremity symptoms are concerning for CRPS type II given extreme allodynia, intermittent swelling, increased sweat at times, hypersensitivity.  Patient has been evaluated by psychologist, Shanon Brow mount for SCS trial and is deemed a candidate from a psych standpoint.  Today we spent the majority of the visit discussing details of spinal cord stimulation including logistics during trial day, need to keep his IPG and SCS trial leads clean and dry and the need to take p.o. antibiotics during the course of his trial to minimize infection.  Patient endorsed understanding.  Plan: -[employer] spinal cord stimulator trial.  Lab-work, procedure(s), and/or referral(s): Orders Placed This Encounter  Procedures  . [place] TRIAL  Provider-requested follow-up: Return for Procedure.  Time Note: Greater than 50% of the 25 minute(s) of face-to-face time spent with Mr. Marchuk, was spent in counseling/coordination of care regarding: Risks and benefits of spinal cord stimulation, Mr. Pautz primary cause of pain, the results of his recent test(s), the treatment plan, treatment alternatives, the risks and possible complications of proposed treatment, going over the informed consent, realistic expectations and the goals of pain management (increased in functionality).  Future Appointments  Date Time Provider [place]  01/08/2019  4:00 PM [name], MD [facility]    Primary Care Physician: Ronnell Freshwater, NP Location: [facility] Note by: Gillis Santa, M.D Date: 01/03/2019; Time: 2:52 PM  Patient Instructions  [employer] SCS trial   Patient has been evaluated by psychologist, Shanon Brow mount.  Have requested records.  Psychologist to fax over Monday, February 3.  Wash with chlorhexidine cleaning solution the night before procedure as well as the morning of procedure

## 2019-01-03 NOTE — Progress Notes (Signed)
Safety precautions to be maintained throughout the outpatient stay will include: orient to surroundings, keep bed in low position, maintain call bell within reach at all times, provide assistance with transfer out of bed and ambulation.  

## 2019-01-04 NOTE — Progress Notes (Signed)
I agree with the above note, plan 

## 2019-01-04 NOTE — Telephone Encounter (Signed)
Questions about further testing due to the elevated ESR.  He will have a colonoscopy next.

## 2019-01-08 ENCOUNTER — Ambulatory Visit: Payer: Medicare HMO | Admitting: Student in an Organized Health Care Education/Training Program

## 2019-01-08 ENCOUNTER — Ambulatory Visit (AMBULATORY_SURGERY_CENTER): Payer: Medicare HMO | Admitting: Gastroenterology

## 2019-01-08 ENCOUNTER — Encounter: Payer: Self-pay | Admitting: Gastroenterology

## 2019-01-08 VITALS — BP 127/78 | HR 67 | Temp 99.3°F | Resp 17 | Ht 75.0 in | Wt 285.0 lb

## 2019-01-08 DIAGNOSIS — D123 Benign neoplasm of transverse colon: Secondary | ICD-10-CM

## 2019-01-08 DIAGNOSIS — D126 Benign neoplasm of colon, unspecified: Secondary | ICD-10-CM | POA: Diagnosis not present

## 2019-01-08 DIAGNOSIS — K922 Gastrointestinal hemorrhage, unspecified: Secondary | ICD-10-CM

## 2019-01-08 DIAGNOSIS — R197 Diarrhea, unspecified: Secondary | ICD-10-CM

## 2019-01-08 DIAGNOSIS — K602 Anal fissure, unspecified: Secondary | ICD-10-CM

## 2019-01-08 DIAGNOSIS — D122 Benign neoplasm of ascending colon: Secondary | ICD-10-CM

## 2019-01-08 DIAGNOSIS — D125 Benign neoplasm of sigmoid colon: Secondary | ICD-10-CM | POA: Diagnosis not present

## 2019-01-08 DIAGNOSIS — R195 Other fecal abnormalities: Secondary | ICD-10-CM

## 2019-01-08 MED ORDER — SODIUM CHLORIDE 0.9 % IV SOLN: 500.0000 mL | INTRAVENOUS | Status: DC

## 2019-01-08 NOTE — Patient Instructions (Addendum)
Handouts Provided:  Polyps  Dr. Ardis Hughs recommends starting Citracal once daily.  This Fiber supplement is over the counter at your local pharmacy.  YOU HAD AN ENDOSCOPIC PROCEDURE TODAY AT [facility]:   Refer to the procedure report that was given to you for any specific questions about what was found during the examination.  If the procedure report does not answer your questions, please call your gastroenterologist to clarify.  If you requested that your care partner not be given the details of your procedure findings, then the procedure report has been included in a sealed envelope for you to review at your convenience later.  YOU SHOULD EXPECT: Some feelings of bloating in the abdomen. Passage of more gas than usual.  Walking can help get rid of the air that was put into your GI tract during the procedure and reduce the bloating. If you had a lower endoscopy (such as a colonoscopy or flexible sigmoidoscopy) you may notice spotting of blood in your stool or on the toilet paper. If you underwent a bowel prep for your procedure, you may not have a normal bowel movement for a few days.  Please Note:  You might notice some irritation and congestion in your nose or some drainage.  This is from the oxygen used during your procedure.  There is no need for concern and it should clear up in a day or so.  SYMPTOMS TO REPORT IMMEDIATELY:   Following lower endoscopy (colonoscopy or flexible sigmoidoscopy):  Excessive amounts of blood in the stool  Significant tenderness or worsening of abdominal pains  Swelling of the abdomen that is new, acute  Fever of 100F or higher  For urgent or emergent issues, a gastroenterologist can be reached at any hour by calling [phone].   DIET:  We do recommend a small meal at first, but then you may proceed to your regular diet.  Drink plenty of fluids but you should avoid alcoholic beverages for 24 hours.  ACTIVITY:  You should plan to take it  easy for the rest of today and you should NOT DRIVE or use heavy machinery until tomorrow (because of the sedation medicines used during the test).    FOLLOW UP: Our staff will call the number listed on your records the next business day following your procedure to check on you and address any questions or concerns that you may have regarding the information given to you following your procedure. If we do not reach you, we will leave a message.  However, if you are feeling well and you are not experiencing any problems, there is no need to return our call.  We will assume that you have returned to your regular daily activities without incident.  If any biopsies were taken you will be contacted by phone or by letter within the next 1-3 weeks.  Please call us at [phone] if you have not heard about the biopsies in 3 weeks.    SIGNATURES/CONFIDENTIALITY: You and/or your care partner have signed paperwork which will be entered into your electronic medical record.  These signatures attest to the fact that that the information above on your After Visit Summary has been reviewed and is understood.  Full responsibility of the confidentiality of this discharge information lies with you and/or your care-partner.

## 2019-01-08 NOTE — Progress Notes (Signed)
To PACU, VSS. Report to [email or web address] 

## 2019-01-08 NOTE — Op Note (Signed)
[place] Patient Name: Jesus Crosby Procedure Date: 01/08/2019 3:32 PM MRN: [id number] Endoscopist: [name] , MD Age: 53 Referring MD:  Date of Birth: 12/20/1965 Gender: Male Account #: [id number] Procedure:                Colonoscopy Indications:              Hematochezia, chronic intermittent loose stools Medicines:                Monitored Anesthesia Care Procedure:                Pre-Anesthesia Assessment:                           - Prior to the procedure, a History and Physical                            was performed, and patient medications and                            allergies were reviewed. The patient's tolerance of                            previous anesthesia was also reviewed. The risks                            and benefits of the procedure and the sedation                            options and risks were discussed with the patient.                            All questions were answered, and informed consent                            was obtained. Prior Anticoagulants: The patient has                            taken no previous anticoagulant or antiplatelet                            agents. ASA Grade Assessment: II - A patient with                            mild systemic disease. After reviewing the risks                            and benefits, the patient was deemed in                            satisfactory condition to undergo the procedure.                           After obtaining informed consent, the colonoscope  was passed under direct vision. Throughout the                            procedure, the patient's blood pressure, pulse, and                            oxygen saturations were monitored continuously. The                            Colonoscope was introduced through the anus and                            advanced to the the terminal ileum, identified by                            appendiceal orifice  and ileocecal valve, and TI.                            The colonoscopy was performed without difficulty.                            The patient tolerated the procedure well. The                            quality of the bowel preparation was good. The                            ileocecal valve, appendiceal orifice, TI, and                            rectum were photographed. Scope In: 3:39:37 PM Scope Out: 3:56:12 PM Scope Withdrawal Time: 0 hours 14 minutes 59 seconds  Total Procedure Duration: 0 hours 16 minutes 35 seconds  Findings:                 Small anal fissure.                           Terminal ileum was normal.                           Three sessile polyps were found in the sigmoid                            colon, transverse colon and ascending colon. The                            polyps were 2 to 3 mm in size. These polyps were                            removed with a cold snare. Resection and retrieval                            were complete.  The exam was otherwise without abnormality on                            direct and retroflexion views.                           Biopsies for histology were taken with a cold                            forceps from the entire colon for evaluation of                            microscopic colitis. Complications:            No immediate complications. Estimated blood loss:                            None. Estimated Blood Loss:     Estimated blood loss: none. Impression:               - Small anal fissure                           - Terminal ileum was normal.                           - Three 2 to 3 mm polyps in the sigmoid colon, in                            the transverse colon and in the ascending colon,                            removed with a cold snare. Resected and retrieved.                           - The examination was otherwise normal on direct                            and retroflexion  views.                           - Biopsies were taken with a cold forceps from the                            entire colon for evaluation of microscopic colitis. Recommendation:           - Patient has a contact number available for                            emergencies. The signs and symptoms of potential                            delayed complications were discussed with the                            patient.  Return to normal activities tomorrow.                            Written discharge instructions were provided to the                            patient.                           - Resume previous diet.                           - Continue present medications, especially the                            topical medicines for the anal fissure (they seem                            to be helping).                           You will receive a letter within 2-3 weeks with the                            pathology results and my final recommendations.                           If the polyp(s) is proven to be 'pre-cancerous' on                            pathology, you will need repeat colonoscopy in 3-5                            years. If the polyp(s) is NOT 'precancerous' on                            pathology then you should repeat colon cancer                            screening in 10 years with colonoscopy without need                            for colon cancer screening by any method prior to                            then (including stool testing). [name], MD 01/08/2019 4:03:27 PM This report has been signed electronically.

## 2019-01-09 ENCOUNTER — Telehealth: Payer: Self-pay

## 2019-01-09 ENCOUNTER — Telehealth: Payer: Self-pay | Admitting: Student in an Organized Health Care Education/Training Program

## 2019-01-09 ENCOUNTER — Ambulatory Visit: Payer: Medicare HMO | Admitting: Nurse Practitioner

## 2019-01-09 NOTE — Telephone Encounter (Signed)
Manuela Schwartz called regarding a PA for SCS trial and stated that the paperwork she received doesn't have the CPT codes on request and she needs those to complete the PA.

## 2019-01-09 NOTE — Telephone Encounter (Signed)
  Follow up Call-  Call back number 01/08/2019  Post procedure Call Back phone  # [phone]  Permission to leave phone message Yes  Some recent data might be hidden     Patient questions:  Do you have a fever, pain , or abdominal swelling? No. Pain Score  0 *  Have you tolerated food without any problems? Yes.    Have you been able to return to your normal activities? Yes.    Do you have any questions about your discharge instructions: Diet   No. Medications  No. Follow up visit  No.  Do you have questions or concerns about your Care? No.  Actions: * If pain score is 4 or above: No action needed, pain <4.

## 2019-01-10 ENCOUNTER — Other Ambulatory Visit: Payer: Self-pay

## 2019-01-10 MED ORDER — FUROSEMIDE 20 MG PO TABS: 20.0000 mg | ORAL_TABLET | Freq: Every morning | ORAL | 3 refills | Status: DC

## 2019-01-15 ENCOUNTER — Telehealth: Payer: Self-pay | Admitting: Student in an Organized Health Care Education/Training Program

## 2019-01-15 NOTE — Telephone Encounter (Signed)
Pt called and left a message stating he has questions about the SCS trial he's having put in tomorrow.

## 2019-01-16 ENCOUNTER — Ambulatory Visit (HOSPITAL_BASED_OUTPATIENT_CLINIC_OR_DEPARTMENT_OTHER): Payer: Medicare HMO | Admitting: Student in an Organized Health Care Education/Training Program

## 2019-01-16 ENCOUNTER — Ambulatory Visit: Payer: Medicare HMO | Admitting: Student in an Organized Health Care Education/Training Program

## 2019-01-16 ENCOUNTER — Ambulatory Visit
Admission: RE | Admit: 2019-01-16 | Discharge: 2019-01-16 | Disposition: A | Discharge status: Home / Self Care | Payer: Medicare HMO | Source: Ambulatory Visit | Attending: Student in an Organized Health Care Education/Training Program | Admitting: Student in an Organized Health Care Education/Training Program

## 2019-01-16 ENCOUNTER — Encounter: Payer: Self-pay | Admitting: Gastroenterology

## 2019-01-16 ENCOUNTER — Encounter: Payer: Self-pay | Admitting: Student in an Organized Health Care Education/Training Program

## 2019-01-16 ENCOUNTER — Other Ambulatory Visit: Payer: Self-pay

## 2019-01-16 VITALS — BP 117/66 | HR 56 | Temp 99.0°F | Resp 15 | Ht 75.0 in | Wt 285.0 lb

## 2019-01-16 DIAGNOSIS — G5772 Causalgia of left lower limb: Secondary | ICD-10-CM | POA: Diagnosis present

## 2019-01-16 DIAGNOSIS — G5771 Causalgia of right lower limb: Secondary | ICD-10-CM

## 2019-01-16 DIAGNOSIS — G5773 Causalgia of bilateral lower limbs: Secondary | ICD-10-CM | POA: Diagnosis present

## 2019-01-16 DIAGNOSIS — G629 Polyneuropathy, unspecified: Secondary | ICD-10-CM

## 2019-01-16 DIAGNOSIS — G5793 Unspecified mononeuropathy of bilateral lower limbs: Secondary | ICD-10-CM | POA: Insufficient documentation

## 2019-01-16 MED ORDER — FENTANYL CITRATE (PF) 100 MCG/2ML IJ SOLN
25.0000 ug | INTRAMUSCULAR | Status: DC | PRN
  Administered 2019-01-16: 75 ug via INTRAVENOUS
  Filled 2019-01-16: qty 2

## 2019-01-16 MED ORDER — DEXTROSE 5 % IV SOLN
3.0000 g | Freq: Once | INTRAVENOUS | Status: AC
  Administered 2019-01-16: 3 g via INTRAVENOUS
  Filled 2019-01-16: qty 3000

## 2019-01-16 MED ORDER — CEPHALEXIN 500 MG PO CAPS: 500.0000 mg | ORAL_CAPSULE | Freq: Four times a day (QID) | ORAL | 0 refills | Status: AC

## 2019-01-16 MED ORDER — CEFAZOLIN SODIUM 1 G IJ SOLR
INTRAMUSCULAR | Status: AC
  Filled 2019-01-16: qty 30

## 2019-01-16 MED ORDER — LACTATED RINGERS IV SOLN
1000.0000 mL | Freq: Once | INTRAVENOUS | Status: AC
  Administered 2019-01-16: 1000 mL via INTRAVENOUS

## 2019-01-16 MED ORDER — MIDAZOLAM HCL 5 MG/5ML IJ SOLN
1.0000 mg | INTRAMUSCULAR | Status: DC | PRN
  Administered 2019-01-16: 1 mg via INTRAVENOUS
  Filled 2019-01-16: qty 5

## 2019-01-16 MED ORDER — DEXTROSE 5 % IV SOLN: 3.0000 g | Freq: Once | INTRAVENOUS | Status: DC

## 2019-01-16 MED ORDER — LIDOCAINE HCL 2 % IJ SOLN
20.0000 mL | Freq: Once | INTRAMUSCULAR | Status: AC
  Administered 2019-01-16: 400 mg
  Filled 2019-01-16: qty 40

## 2019-01-16 MED ORDER — ROPIVACAINE HCL 2 MG/ML IJ SOLN
10.0000 mL | Freq: Once | INTRAMUSCULAR | Status: AC
  Administered 2019-01-16: 10 mL
  Filled 2019-01-16: qty 10

## 2019-01-16 NOTE — Progress Notes (Signed)
LEFT Enter at L1 L2 T9, RIGHT Enter at L2 L3T10) by Ignatius Specking, RN

## 2019-01-16 NOTE — Progress Notes (Signed)
Safety precautions to be maintained throughout the outpatient stay will include: orient to surroundings, keep bed in low position, maintain call bell within reach at all times, provide assistance with transfer out of bed and ambulation.  

## 2019-01-16 NOTE — Progress Notes (Signed)
Patient's Name: Jesus Crosby.  MRN: [id number]  Referring Provider: Gillis Santa, MD  DOB: 05-12-1966  PCP: Ronnell Freshwater, NP  DOS: 01/16/2019  Note by: Gillis Santa, MD  Service setting: Ambulatory outpatient  Specialty: Interventional Pain Management  Patient type: Established  Location: [facility] (AMB) Pain Management Facility  Visit type: Interventional Procedure   Primary Reason for Admission: Surgical management of chronic pain condition.  Procedure:  Anesthesia, Analgesia, Anxiolysis:  Type: Trial Spinal Cord Neurostimulator Implant (Percutaneous, interlaminar, posterior epidural placement) Purpose: To determine if a permanent implant may be effective in controlling some or all of Jesus Crosby's chronic pain symptoms.  Region: Lumbar Level: Left: L1/2 Entry, Final Position superiorT9             Right: L2/3 Entry, Final Position inferior T9 Laterality: Bilateral Paramedial  Type: Moderate (Conscious) Sedation combined with Local Anesthesia Indication(s): Analgesia and Anxiety Route: Intravenous (IV) IV Access: Secured Sedation: Meaningful verbal contact was maintained at all times during the procedure  Local Anesthetic: Lidocaine 1-2%   Indications: 1. Neuropathic pain of both legs   2. Complex regional pain syndrome type 2 of both lower extremities   3. 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 (year old), male patient, seen today for interventional treatment. He  has a past surgical history that includes Intraocular lens insertion (Bilateral); Wrist surgery (Right); Dermabrasion of face; Eye surgery; Pars plana vitrectomy (Right, 09/17/2013); Photocoagulation (Right, 09/17/2013); Gas/fluid exchange (Right, 09/17/2013); Nasal septum surgery (march 2016); Umbilical hernia repair (N/A, 05/07/2015); Colonoscopy with propofol (N/A, 07/30/2015); Esophagogastroduodenoscopy (egd) with propofol  (N/A, 07/30/2015); scapholunate ligament reconstruction of right hand (2011); distal radius fracture of right hand (plates, screws, and pins) (2011); gastric sleeve surgery (08/17/2016); and Hip Arthroplasty (Left).  Initial Vital Signs:  Pulse/EKG Rate: 66  Temp: 99.3 F (37.4 C) Resp: 18 BP: 110/67 SpO2: 98 %  BMI: Estimated body mass index is 35.62 kg/m as calculated from the following:   Height as of this encounter: 6\' 3"  (1.905 m).   Weight as of this encounter: 285 lb (129.3 kg).  Risk Assessment: Allergies: Reviewed. He is allergic to pineapple flavor; pineapple; and amoxicillin.  Allergy Precautions: None required Coagulopathies: Reviewed. None identified.  Blood-thinner therapy: None at this time Active Infection(s): Reviewed. None identified. Jesus Crosby is afebrile  Site Confirmation: Jesus Crosby was asked to confirm the procedure and laterality before marking the site, which he did. Procedure checklist: Completed Consent: Before the procedure and under the influence of no sedative(s), amnesic(s), or anxiolytics, the patient was informed of the treatment options, risks and possible complications. To fulfill our ethical and legal obligations, as recommended by the American Medical Association's Code of Ethics, I have informed the patient of my clinical impression; the nature and purpose of the treatment or procedure; the risks, benefits, and possible complications of the intervention; the alternatives, including doing nothing; the risk(s) and benefit(s) of the alternative treatment(s) or procedure(s); and the risk(s) and benefit(s) of doing nothing.  Jesus Crosby was provided with information about the general risks and possible complications associated with most interventional procedures. These include, but are not limited to: failure to achieve desired goals, infection, bleeding, organ or nerve damage, allergic reactions, paralysis, and/or death.  In addition, he was informed of those  risks and possible complications associated to this particular procedure, which include, but are not limited to: damage to  the implant; failure to decrease pain; local, systemic, or serious CNS infections, intraspinal abscess with possible cord compression and paralysis, or life-threatening such as meningitis; intrathecal and/or epidural bleeding with formation of hematoma with possible spinal cord compression and permanent paralysis; organ damage; nerve injury or damage with subsequent sensory, motor, and/or autonomic system dysfunction, resulting in transient or permanent pain, numbness, and/or weakness of one or several areas of the body; allergic reactions, either minor or major life-threatening, such as anaphylactic or anaphylactoid reactions.  Furthermore, Jesus Crosby was informed of those risks and complications associated with the medications. These include, but are not limited to: allergic reactions (i.e.: anaphylactic or anaphylactoid reactions); arrhythmia;  Hypotension/hypertension; cardiovascular collapse; respiratory depression and/or shortness of breath; swelling or edema; medication-induced neural toxicity; particulate matter embolism and blood vessel occlusion with resultant organ, and/or nervous system infarction and permanent paralysis.  Finally, he was informed that Medicine is not an exact science; therefore, there is also the possibility of unforeseen or unpredictable risks and/or possible complications that may result in a catastrophic outcome. The patient indicated having understood very clearly. We have given the patient no guarantees and we have made no promises. Enough time was given to the patient to ask questions, all of which were answered to the patient's satisfaction. Jesus Crosby has indicated that he wanted to continue with the procedure. Attestation: I, the ordering provider, attest that I have discussed with the patient the benefits, risks, side-effects, alternatives, likelihood of  achieving goals, and potential problems during recovery for the procedure that I have provided informed consent. Date  Time: 01/16/2019  7:55 AM  Pre-Procedure Preparation:  Monitoring: As per clinic protocol. Respiration, ETCO2, SpO2, BP, heart rate and rhythm monitor placed and checked for adequate function Safety Precautions: Patient was assessed for positional comfort and pressure points before starting the procedure. Time-out: I initiated and conducted the "Time-out" before starting the procedure, as per protocol. The patient was asked to participate by confirming the accuracy of the "Time Out" information. Verification of the correct person, site, and procedure were performed and confirmed by me, the nursing staff, and the patient. "Time-out" conducted as per Joint Commission's Universal Protocol (UP.01.01.01). Time: 32  Description of Procedure Process:   Position: Prone Target Area: Posterior epidural space Approach: Posterior percutaneous, paramedial, interlaminar approach Area Prepped: Bilateral thoraco-lumbar Region Prepping solution: ChloraPrep (2% chlorhexidine gluconate and 70% isopropyl alcohol) Safety Precautions: Safe injection practices and needle disposal techniques used. Medications properly checked for expiration dates. SDV (single dose vial) medications used. Aspiration looking for blood return and/or CSF was conducted prior to all injections. At no point did I inject any substances, as a needle was being advanced. No attempts were made at seeking any paresthesias.  Description of the Procedure: Availability of a responsible, adult driver, and NPO status confirmed. Informed consent was obtained after having discussed risks and possible complications. An IV was started. The patient was then taken to the fluoroscopy suite, where the patient was placed in position for the procedure, over the fluoroscopy table. The patient was then monitored in the usual manner. Fluoroscopy was  manipulated to obtain the best possible view of the target. Parallex error was corrected before commencing the procedure. Once a clear view of the target had been obtained, the skin and deeper tissues over the procedure site were infiltrated using lidocaine, loaded in a 10 cc luer-loc syringe with a 0.5 inch, 25-G needle. The introducer needle(s) was/were then inserted through the skin and deeper tissues. A paramidline  approach was used to enter the posterior epidural space at a 30 angle, using "Loss-of-resistance Technique" with 3 ml of PF-NaCl (0.9% NSS). Correct needle placement was confirmed in the antero-posterior and lateral fluoroscopic views. The lead was gently introduced and manipulated under real-time fluoroscopy, constantly assessing for pain, discomfort, or paresthesias, until the tip rested at the desired level. Both sides were done in identical fashion. Electrode placement was tested until appropriate coverage was attained. Once the patient confirmed that the stimulation was over the desired area, the lead(s) was/were secured in place and the introducer needles removed. This was done under real-time fluoroscopy while observing the electrode tip to avoid unintended migration. The area was covered with a non-occlusive dressing and the patient transported to recovery for further programming.  Vitals:   01/16/19 1010 01/16/19 1015 01/16/19 1025 01/16/19 1035  BP: 127/90 113/61 115/68 117/66  Pulse: 61 63 62 (!) 56  Resp: 15 18 15 15   Temp:  99.3 F (37.4 C)  99 F (37.2 C)  TempSrc:  Temporal  Temporal  SpO2: 98% 96% 97% 98%  Weight:      Height:       Start Time: 0840 hrs. End Time: 1001 hrs.  Neurostimulator Details:   Lead(s):  Brand: Medtronic Epidural Access Level:  L1-2 L2-3  Lead implant:  Left Right  No. of Electrodes/Lead:  8 8  Laterality:  Left Right  Top electrode location:  T9 (superior body) T9 (inferior body)  Bottom electrode location:  T10 (inferior body) T11  (mid body)  Model No.:   [id number]       [id number]   Length:  60 cm       60 cm  Lot No.:   [id number]         VA [id number]  MRI compatibility:  Yes           External Neurostimulator    Model No.:   [id number]   Serial No.:   N LJ [id number] N      Imaging Guidance (Spinal):          Type of Imaging Technique: Fluoroscopy Guidance (Spinal) Indication(s): Assistance in needle guidance and placement for procedures requiring needle placement in or near specific anatomical locations not easily accessible without such assistance. Exposure Time: Please see nurses notes. Contrast: None used. Fluoroscopic Guidance: I was personally present during the use of fluoroscopy. "Tunnel Vision Technique" used to obtain the best possible view of the target area. Parallax error corrected before commencing the procedure. "Direction-depth-direction" technique used to introduce the needle under continuous pulsed fluoroscopy. Once target was reached, antero-posterior, oblique, and lateral fluoroscopic projection used confirm needle placement in all planes. Images permanently stored in EMR. Interpretation: No contrast injected. I personally interpreted the imaging intraoperatively. Adequate needle placement confirmed in multiple planes. Permanent images saved into the patient's record.  Antibiotic Prophylaxis:   Anti-infectives (From admission, onward)   Start     Dose/Rate Route Frequency Ordered Stop   01/16/19 0830  ceFAZolin (ANCEF) 3 g in dextrose 5 % 50 mL IVPB     3 g 100 mL/hr over 30 Minutes Intravenous  Once 01/16/19 0818 01/16/19 0852   01/16/19 0830  ceFAZolin (ANCEF) 3 g in dextrose 5 % 50 mL IVPB  Status:  Discontinued     3 g 100 mL/hr over 30 Minutes Intravenous  Once 01/16/19 0818 01/16/19 0824   01/16/19 0000  cephALEXin (KEFLEX) 500 MG capsule     500 mg Oral 4 times  daily 01/16/19 0816 01/23/19 2359     Indication(s): Procedural Prophylaxis.  Post-operative Assessment:  Post-procedure Vital Signs:   Pulse/HCG Rate: (!) 56  Temp: 99 F (37.2 C) Resp: 15 BP: 117/66 SpO2: 98 %  Complications: No immediate post-treatment complications observed by team, or reported by patient.  Note: The patient tolerated the entire procedure well. A repeat set of vitals were taken after the procedure and the patient was kept under observation following institutional policy, for this type of procedure. Post-procedural neurological assessment was performed, showing return to baseline, prior to discharge. The patient was provided with post-procedure discharge instructions, including a section on how to identify potential problems. Should any problems arise concerning this procedure, the patient was given instructions to immediately contact us, at any time, without hesitation. In any case, we plan to contact the patient by telephone for a follow-up status report regarding this interventional procedure.  Comments:  No additional relevant information.  Plan of Care    Imaging Orders     DG C-Arm 1-60 Min-No Report Procedure Orders    No procedure(s) ordered today    Medications ordered for procedure: Meds ordered this encounter  Medications  . lactated ringers infusion 1,000 mL  . midazolam (VERSED) 5 MG/5ML injection 1-2 mg    Make sure Flumazenil is available in the pyxis when using this medication. If oversedation occurs, administer 0.2 mg IV over 15 sec. If after 45 sec no response, administer 0.2 mg again over 1 min; may repeat at 1 min intervals; not to exceed 4 doses (1 mg)  . fentaNYL (SUBLIMAZE) injection 25-100 mcg    Make sure Narcan is available in the pyxis when using this medication. In the event of respiratory depression (RR< 8/min): Titrate NARCAN (naloxone) in increments of 0.1 to 0.2 mg IV at 2-3 minute intervals, until desired degree of reversal.  . lidocaine (XYLOCAINE) 2 % (with pres) injection 400 mg  . ropivacaine (PF) 2 mg/mL (0.2%) (NAROPIN) injection 10 mL  . ceFAZolin (ANCEF)  3 g in dextrose 5 % 50 mL IVPB    Order Specific Question:   Antibiotic Indication:    Answer:   Surgical Prophylaxis    Order Specific Question:   Other Indication:    Answer:   Procedure Prophylaxis  . DISCONTD: ceFAZolin (ANCEF) 3 g in dextrose 5 % 50 mL IVPB    Order Specific Question:   Antibiotic Indication:    Answer:   Surgical Prophylaxis    Order Specific Question:   Other Indication:    Answer:   Procedure Prophylaxis  . cephALEXin (KEFLEX) 500 MG capsule    Sig: Take 1 capsule (500 mg total) by mouth 4 (four) times daily for 7 days.    Dispense:  28 capsule    Refill:  0   Medications administered: We administered lactated ringers, midazolam, fentaNYL, lidocaine, ropivacaine (PF) 2 mg/mL (0.2%), and ceFAZolin.  See the medical record for exact dosing, route, and time of administration.  Disposition: Discharge home  Discharge Date & Time: 01/16/2019; 1050 hrs.   Physician-requested Follow-up: Return in about 1 week (around 01/23/2019).  Post Procedure Instructions: Today we did the following -We have done a Spinal Cord Stimulator Trial with MEDTRONIC  -As long as the leads are in place, do not bathe or shower. You may sponge bathe.  -While the lead is in place, please limit the bending, lifting, or twisting because the lead can move.  -The things we want to see is if your  pain improves (and by what percentage), if you can do more activity (don't overdo it), and if you can use less of your "as needed" medicine. Do not stop long acting medicines like methadone, oxycontin, MS Contin, etc without checking with [place].  -It is VERY important that you pick up the antibiotics we prescribed, Keflex, on your way home from the trial and take them as prescribed(4 times a day), starting today, for as long as the lead is in place.  -The Spinal Cord Stimulator Representative- SUSAN will be in contact with you while the lead is in place to make sure the trial goes as well as  possible.  -Please contact us with any questions or concerns at any time during the trial.   -If you start running a fever over 100 degrees, have severe back pain, or new pain running down the legs, or drainage coming from the lead site, contact us immediately and/or go to the emergency room.  -Please do not restart any sort of medication that can thin your blood such as Aspirin, ibuprofen, motrin, aleve, plavix, coumadin, etc. If you aren't sure, call and ask.  -We will have you return on  01/23/19 to have the lead removed. If this is successful, at that point we can go over the referral to Neurosurgery for permament implant.    Future Appointments  Date Time Provider [place]  01/23/2019  9:45 AM Gillis Santa, MD [facility] None   Primary Care Physician: Ronnell Freshwater, NP Location: [facility] Note by: Gillis Santa, MD Date: 01/16/2019; Time: 11:27 AM  Disclaimer:  Medicine is not an exact science. The only guarantee in medicine is that nothing is guaranteed. It is important to note that the decision to proceed with this intervention was based on the information collected from the patient. The Data and conclusions were drawn from the patient's questionnaire, the interview, and the physical examination. Because the information was provided in large part by the patient, it cannot be guaranteed that it has not been purposely or unconsciously manipulated. Every effort has been made to obtain as much relevant data as possible for this evaluation. It is important to note that the conclusions that lead to this procedure are derived in large part from the available data. Always take into account that the treatment will also be dependent on availability of resources and existing treatment guidelines, considered by other Pain Management Practitioners as being common knowledge and practice, at the time of the intervention. For Medico-Legal purposes, it is also  important to point out that variation in procedural techniques and pharmacological choices are the acceptable norm. The indications, contraindications, technique, and results of the above procedure should only be interpreted and judged by a Board-Certified Interventional Pain Specialist with extensive familiarity and expertise in the same exact procedure and technique.

## 2019-01-16 NOTE — Patient Instructions (Addendum)
Today we did the following -We have done a Spinal Cord Stimulator Trial with MEDTRONIC  -As long as the leads are in place, do not bathe or shower. You may sponge bathe.  -While the lead is in place, please limit the bending, lifting, or twisting because the lead can move.  -The things we want to see is if your pain improves (and by what percentage), if you can do more activity (don't overdo it), and if you can use less of your "as needed" medicine. Do not stop long acting medicines like methadone, oxycontin, MS Contin, etc without checking with [place].  -It is VERY important that you pick up the antibiotics we prescribed, Keflex, on your way home from the trial and take them as prescribed(4 times a day), starting today, for as long as the lead is in place.  -The Spinal Cord Stimulator Representative- SUSAN will be in contact with you while the lead is in place to make sure the trial goes as well as possible.  -Please contact us with any questions or concerns at any time during the trial.   -If you start running a fever over 100 degrees, have severe back pain, or new pain running down the legs, or drainage coming from the lead site, contact us immediately and/or go to the emergency room.  -Please do not restart any sort of medication that can thin your blood such as Aspirin, ibuprofen, motrin, aleve, plavix, coumadin, etc. If you aren't sure, call and ask.  -We will have you return on  01/23/19 to have the lead removed. If this is successful, at that point we can go over the referral to Neurosurgery for permament implant.  ____________________________________________________________________________________________  Post-Procedure Discharge Instructions  Instructions:  Apply ice: Fill a plastic sandwich bag with crushed ice. Cover it with a small towel and apply to injection site. Apply for 15 minutes then remove x 15 minutes. Repeat sequence on day of procedure, until you go to bed. The purpose  is to minimize swelling and discomfort after procedure.  Apply heat: Apply heat to procedure site starting the day following the procedure. The purpose is to treat any soreness and discomfort from the procedure.  Food intake: Start with clear liquids (like water) and advance to regular food, as tolerated.   Physical activities: Keep activities to a minimum for the first 8 hours after the procedure.   Driving: If you have received any sedation, you are not allowed to drive for 24 hours after your procedure.  Blood thinner: Restart your blood thinner 6 hours after your procedure. (Only for those taking blood thinners)  Insulin: As soon as you can eat, you may resume your normal dosing schedule. (Only for those taking insulin)  Infection prevention: Keep procedure site clean and dry.  Post-procedure Pain Diary: Extremely important that this be done correctly and accurately. Recorded information will be used to determine the next step in treatment.  Pain evaluated is that of treated area only. Do not include pain from an untreated area.  Complete every hour, on the hour, for the initial 8 hours. Set an alarm to help you do this part accurately.  Do not go to sleep and have it completed later. It will not be accurate.  Follow-up appointment: Keep your follow-up appointment after the procedure. Usually 2 weeks for most procedures. (6 weeks in the case of radiofrequency.) Bring you pain diary.   Expect:  From numbing medicine (AKA: Local Anesthetics): Numbness or decrease in pain.  Onset:  Full effect within 15 minutes of injected.  Duration: It will depend on the type of local anesthetic used. On the average, 1 to 8 hours.   From steroids: Decrease in swelling or inflammation. Once inflammation is improved, relief of the pain will follow.  Onset of benefits: Depends on the amount of swelling present. The more swelling, the longer it will take for the benefits to be seen. In some cases,  up to 10 days.  Duration: Steroids will stay in the system x 2 weeks. Duration of benefits will depend on multiple posibilities including persistent irritating factors.  Occasional side-effects: Facial flushing (red, warm cheeks) , cramps (if present, drink Gatorade and take over-the-counter Magnesium 450-500 mg once to twice a day).  From procedure: Some discomfort is to be expected once the numbing medicine wears off. This should be minimal if ice and heat are applied as instructed.  Call if:  You experience numbness and weakness that gets worse with time, as opposed to wearing off.  New onset bowel or bladder incontinence. (This applies to Spinal procedures only)  Emergency Numbers:  [facility] business hours (Monday - Thursday, 8:00 AM - 4:00 PM) (Friday, 9:00 AM - 12:00 Noon): [phone]  After hours: [phone] ____________________________________________________________________________________________

## 2019-01-17 ENCOUNTER — Telehealth: Payer: Self-pay

## 2019-01-17 NOTE — Telephone Encounter (Signed)
Denies any needs at this time. States that he is feeling so much better, patient also with dr Holley Raring as a follow up.

## 2019-01-21 ENCOUNTER — Ambulatory Visit: Payer: Medicare HMO | Admitting: Student in an Organized Health Care Education/Training Program

## 2019-01-22 ENCOUNTER — Telehealth: Payer: Self-pay | Admitting: Student in an Organized Health Care Education/Training Program

## 2019-01-22 NOTE — Telephone Encounter (Signed)
Pt left a voicemail stating he is coming in tomorrow to have the SCS removed and wanted to know if he was supposed to not eat after midnight or what he was supposed to do.

## 2019-01-22 NOTE — Telephone Encounter (Signed)
Patient informed no need to be NPO for SCS removal.

## 2019-01-23 ENCOUNTER — Other Ambulatory Visit: Payer: Self-pay

## 2019-01-23 ENCOUNTER — Encounter: Payer: Self-pay | Admitting: Student in an Organized Health Care Education/Training Program

## 2019-01-23 ENCOUNTER — Ambulatory Visit
Admission: RE | Admit: 2019-01-23 | Discharge: 2019-01-23 | Disposition: A | Discharge status: Home / Self Care | Payer: Medicare HMO | Source: Ambulatory Visit | Attending: Student in an Organized Health Care Education/Training Program | Admitting: Student in an Organized Health Care Education/Training Program

## 2019-01-23 ENCOUNTER — Ambulatory Visit (HOSPITAL_BASED_OUTPATIENT_CLINIC_OR_DEPARTMENT_OTHER): Payer: Medicare HMO | Admitting: Student in an Organized Health Care Education/Training Program

## 2019-01-23 VITALS — BP 122/78 | HR 63 | Temp 98.3°F | Ht 75.0 in | Wt 285.0 lb

## 2019-01-23 DIAGNOSIS — G5793 Unspecified mononeuropathy of bilateral lower limbs: Secondary | ICD-10-CM | POA: Insufficient documentation

## 2019-01-23 DIAGNOSIS — G629 Polyneuropathy, unspecified: Secondary | ICD-10-CM | POA: Insufficient documentation

## 2019-01-23 DIAGNOSIS — G5772 Causalgia of left lower limb: Secondary | ICD-10-CM | POA: Insufficient documentation

## 2019-01-23 DIAGNOSIS — G5771 Causalgia of right lower limb: Secondary | ICD-10-CM | POA: Insufficient documentation

## 2019-01-23 DIAGNOSIS — M48061 Spinal stenosis, lumbar region without neurogenic claudication: Secondary | ICD-10-CM | POA: Diagnosis present

## 2019-01-23 NOTE — Progress Notes (Signed)
Patient's Name: Jesus Crosby.  MRN: [id number]  Referring Provider: Ronnell Freshwater, NP  DOB: 03/09/1966  PCP: Ronnell Freshwater, NP  DOS: 01/23/2019  Note by: Gillis Santa, MD  Service setting: Ambulatory outpatient  Specialty: Interventional Pain Management  Location: [facility] (AMB) Pain Management Facility    Patient type: Established   Primary Reason(s) for Visit: Encounter for post-procedure evaluation of chronic illness with mild to moderate exacerbation CC: toe and finger pain bilaterally (numbness)  HPI  Jesus Crosby is a 53 y.o. year old, male patient, who comes today for a post-procedure evaluation. 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[place]); HYPERLIPIDEMIA; ANXIETY; OBSESSIVE-COMPULSIVE DISORDER; DDD (degenerative disc disease), lumbosacral; HTN (hypertension); Intraocular lens dislocation; CVA (cerebral infarction); Diplopia; Sinus tachycardia; Paresthesia; Alcohol-induced mood disorder ([place]); OCD (obsessive compulsive disorder); Arthritis; Arthritis of knee; Bilateral cataracts; Class 1 obesity; Disorder of lens; Exomphalos; History of total hip arthroplasty; Major depressive disorder, single episode; Neuropathy; OSA on CPAP; Prediabetes; S/P laparoscopic sleeve gastrectomy; Spinal stenosis of lumbar region; Trochanteric bursitis; Aphasia; Complex regional pain syndrome type 2 of both lower extremities; and Neuropathic pain of both legs on their problem list. His primarily concern today is the toe and finger pain bilaterally (numbness)  Pain Assessment: Location: Right, Left Other (Comment)(toes and fingers bilaterally) Radiating:   Onset: More than a month ago Duration: Chronic pain Quality: Numbness Severity: 3 /10 (subjective, self-reported pain score)  Note: Reported level is compatible with observation.                         When using our objective Pain Scale, levels between 6 and 10/10 are said to belong in an emergency room, as it progressively worsens from a  6/10, described as severely limiting, requiring emergency care not usually available at an outpatient pain management facility. At a 6/10 level, communication becomes difficult and requires great effort. Assistance to reach the emergency department may be required. Facial flushing and profuse sweating along with potentially dangerous increases in heart rate and blood pressure will be evident. Effect on ADL: limits daily activities Timing: Intermittent Modifying factors: SCS, sitting with feet elevated BP: 122/78  HR: 63  [name] comes in today for post-procedure evaluation.  Further details on both, my assessment(s), as well as the proposed treatment plan, please see below.  Post-Procedure Assessment  01/22/2019 Procedure: [employer] spinal Cord Stimulator Trial Removal              Left: L1/2 Entry, Final Position superiorT9             Right: L2/3 Entry, Final Position inferior T9      benefits:   Analgesia: 75-100 %            Function: Somewhat improved ROM: Somewhat improved   Interpretation: Results would suggest a successful spinal cord stimulator trial.  Spinal cord stimulator trial leads were removed under live fluoroscopy.  Tips intact.  No erythema or redness noticed at battery or insertion site.  Will refer patient to neurosurgery for spinal cord stimulator implant.  Plan:  Please see "Plan of Care" for details.                Laboratory Chemistry  Inflammation Markers (CRP: Acute Phase) (ESR: Chronic Phase) Lab Results  Component Value Date   CRP 0.2 (L) 09/21/2018   ESRSEDRATE 52 (H) 01/02/2019  Rheumatology Markers Lab Results  Component Value Date   ANA NEG 08/03/2015                        Renal Function Markers Lab Results  Component Value Date   BUN 10 10/06/2018   CREATININE 1.02 10/06/2018   BCR 13 08/29/2018   GFRAA >60 10/06/2018   GFRNONAA >60 10/06/2018                             Hepatic Function Markers Lab  Results  Component Value Date   AST 57 (H) 10/04/2018   ALT 56 (H) 10/04/2018   ALBUMIN 4.7 10/04/2018   ALKPHOS 54 10/04/2018   HCVAB NEGATIVE 08/03/2015                        Electrolytes Lab Results  Component Value Date   NA 138 10/06/2018   K 4.4 10/06/2018   CL 100 10/06/2018   CALCIUM 9.1 10/06/2018                        Neuropathy Markers Lab Results  Component Value Date   HGBA1C 5.1 10/04/2018   HIV Non Reactive 10/05/2018                        CNS Tests No results found for: COLORCSF, APPEARCSF, RBCCOUNTCSF, WBCCSF, POLYSCSF, LYMPHSCSF, EOSCSF, PROTEINCSF, GLUCCSF, JCVIRUS, CSFOLI, IGGCSF                      Bone Pathology Markers No results found for: VD25OH, [id number], [id number], [id number], 25OHVITD1, 25OHVITD2, 25OHVITD3, TESTOFREE, TESTOSTERONE                       Coagulation Parameters Lab Results  Component Value Date   INR 1.13 10/04/2018   LABPROT 14.4 10/04/2018   APTT 30 10/04/2018   PLT 264.0 01/02/2019   DDIMER 0.28 08/15/2014                        Cardiovascular Markers Lab Results  Component Value Date   CKTOTAL 77 10/06/2018   TROPONINI <0.03 10/04/2018   HGB 14.4 01/02/2019   HCT 42.1 01/02/2019                         CA Markers No results found for: CEA, CA125, LABCA2                      Endocrine Markers Lab Results  Component Value Date   TSH 1.240 08/29/2018   FREET4 1.11 08/29/2018                        Note: Lab results reviewed.  Recent Diagnostic Imaging Results  DG C-Arm 1-60 Min-No Report Fluoroscopy was utilized by the requesting physician.  No radiographic  interpretation.   Complexity Note: Imaging results reviewed. Results shared with Jesus Crosby, using [employer].                         Meds   Current Outpatient Medications:  .  Alpha-Lipoic Acid 200 MG CAPS, Take by mouth. Take 6 cap daily. ('1200mg'$ /day), Disp: , Rfl:  .  ALPRAZolam (XANAX) 0.5 MG tablet, Take 0.5 mg by mouth 3 (three)  times daily., Disp: , Rfl:  .  AMBULATORY NON FORMULARY MEDICATION, Medication Name:  Diltiazem 2 %/ Lidocaine 5 % Apply pea size amount 1 inch inside the anus 3 times daily. For 3 weeks. Or until symptoms resolve., Disp: 30 g, Rfl: 2 .  amLODipine (NORVASC) 5 MG tablet, TAKE 1 TABLET(5 MG) BY MOUTH TWICE DAILY, Disp: 180 tablet, Rfl: 0 .  aspirin EC 81 MG tablet, Take 81 mg by mouth daily., Disp: , Rfl:  .  atorvastatin (LIPITOR) 10 MG tablet, Take 1 tablet (10 mg total) by mouth daily., Disp: 90 tablet, Rfl: 3 .  Betaine (CYSTADANE) POWD, Take 8 Scoops by mouth 3 (three) times daily., Disp: , Rfl:  .  cephALEXin (KEFLEX) 500 MG capsule, Take 1 capsule (500 mg total) by mouth 4 (four) times daily for 7 days., Disp: 28 capsule, Rfl: 0 .  Cyanocobalamin (VITAMIN B 12 PO), Take 1,000 mcg by mouth at bedtime. , Disp: , Rfl:  .  cycloSPORINE (RESTASIS) 0.05 % ophthalmic emulsion, Place 1 drop into both eyes 2 (two) times daily., Disp: , Rfl:  .  dicyclomine (BENTYL) 10 MG capsule, Take 1 capsule (10 mg total) by mouth every 6 (six) hours as needed for spasms., Disp: 90 capsule, Rfl: 2 .  folic acid (FOLVITE) 268 MCG tablet, Take 1,200 mcg by mouth at bedtime. , Disp: , Rfl:  .  furosemide (LASIX) 20 MG tablet, Take 1 tablet (20 mg total) by mouth every morning., Disp: 30 tablet, Rfl: 3 .  gabapentin (NEURONTIN) 600 MG tablet, Take 1,200 mg by mouth 2 (two) times daily. , Disp: , Rfl:  .  losartan (COZAAR) 50 MG tablet, TAKE 1/2 TABLET BY MOUTH EVERY DAY, Disp: 45 tablet, Rfl: 3 .  metoprolol tartrate (LOPRESSOR) 50 MG tablet, Take 1.5 tablets (75 mg total) by mouth 2 (two) times daily., Disp: 270 tablet, Rfl: 0 .  pyridOXINE (VITAMIN B-6) 100 MG tablet, Take 500 mg by mouth at bedtime. , Disp: , Rfl:  .  ramelteon (ROZEREM) 8 MG tablet, Take 1 tablet (8 mg total) by mouth at bedtime., Disp: 30 tablet, Rfl: 0 .  sertraline (ZOLOFT) 100 MG tablet, Take 200 mg by mouth daily. , Disp: , Rfl:   ROS    Constitutional: Denies any fever or chills Gastrointestinal: No reported hemesis, hematochezia, vomiting, or acute GI distress Musculoskeletal: Denies any acute onset joint swelling, redness, loss of ROM, or weakness Neurological: No reported episodes of acute onset apraxia, aphasia, dysarthria, agnosia, amnesia, paralysis, loss of coordination, or loss of consciousness  Allergies  Mr. Helzer is allergic to pineapple flavor; pineapple; and amoxicillin.  [place]  Drug: Mr. Magnan  reports no history of drug use. Alcohol:  reports no history of alcohol use. Tobacco:  reports that he has never smoked. He has never used smokeless tobacco. 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 Surgical: Mr. Haugan  has a past surgical history that includes Intraocular lens insertion (Bilateral); Wrist surgery (Right); Dermabrasion of face; Eye surgery; Pars plana vitrectomy (Right, 09/17/2013); Photocoagulation (Right, 09/17/2013); Gas/fluid exchange (Right, 09/17/2013); Nasal septum surgery (march 2016); Umbilical hernia repair (  N/A, 05/07/2015); Colonoscopy with propofol (N/A, 07/30/2015); Esophagogastroduodenoscopy (egd) with propofol (N/A, 07/30/2015); scapholunate ligament reconstruction of right hand (2011); distal radius fracture of right hand (plates, screws, and pins) (2011); gastric sleeve surgery (08/17/2016); and Hip Arthroplasty (Left). Family: family history includes Breast cancer in his mother; Hypertension in his father and mother; Stroke in his maternal grandfather, maternal  grandmother, paternal grandfather, paternal grandmother, and another family member.  Constitutional Exam  General appearance: Well nourished, well developed, and well hydrated. In no apparent acute distress Vitals:   01/23/19 0942  BP: 122/78  Pulse: 63  Temp: 98.3 F (36.8 C)  TempSrc: Oral  SpO2: 99%  Weight: 285 lb (129.3 kg)  Height: '6\' 3"'$  (1.905 m)   BMI Assessment: Estimated body mass index is 35.62 kg/m as calculated from the following:   Height as of this encounter: '6\' 3"'$  (1.905 m).   Weight as of this encounter: 285 lb (129.3 kg).  BMI interpretation table: BMI level Category Range association with higher incidence of chronic pain  <18 kg/m2 Underweight   18.5-24.9 kg/m2 Ideal body weight   25-29.9 kg/m2 Overweight Increased incidence by 20%  30-34.9 kg/m2 Obese (Class I) Increased incidence by 68%  35-39.9 kg/m2 Severe obesity (Class II) Increased incidence by 136%  >40 kg/m2 Extreme obesity (Class III) Increased incidence by 254%   Patient's current BMI Ideal Body weight  Body mass index is 35.62 kg/m. Ideal body weight: 84.5 kg (186 lb 4.6 oz) Adjusted ideal body weight: 102.4 kg (225 lb 12.4 oz)   BMI Readings from Last 4 Encounters:  01/23/19 35.62 kg/m  01/16/19 35.62 kg/m  01/08/19 35.62 kg/m  01/03/19 35.62 kg/m   Wt Readings from Last 4 Encounters:  01/23/19 285 lb (129.3 kg)  01/16/19 285 lb (129.3 kg)  01/08/19 285 lb (129.3 kg)  01/03/19 285 lb (129.3 kg)  Psych/Mental status: Alert, oriented x 3 (person, place, & time)       Eyes: PERLA Respiratory: No evidence of acute respiratory distress  Cervical Spine Area Exam  Skin & Axial Inspection: No masses, redness, edema, swelling, or associated skin lesions Alignment: Symmetrical Functional ROM: Unrestricted ROM      Stability: No instability detected Muscle Tone/Strength: Functionally intact. No obvious neuro-muscular anomalies detected. Sensory (Neurological): Unimpaired Palpation: No  palpable anomalies              Upper Extremity (UE) Exam    Side: Right upper extremity  Side: Left upper extremity  Skin & Extremity Inspection: Skin color, temperature, and hair growth are WNL. No peripheral edema or cyanosis. No masses, redness, swelling, asymmetry, or associated skin lesions. No contractures.  Skin & Extremity Inspection: Skin color, temperature, and hair growth are WNL. No peripheral edema or cyanosis. No masses, redness, swelling, asymmetry, or associated skin lesions. No contractures.  Functional ROM: Unrestricted ROM          Functional ROM: Unrestricted ROM          Muscle Tone/Strength: Functionally intact. No obvious neuro-muscular anomalies detected.  Muscle Tone/Strength: Functionally intact. No obvious neuro-muscular anomalies detected.  Sensory (Neurological): Unimpaired          Sensory (Neurological): Unimpaired          Palpation: No palpable anomalies              Palpation: No palpable anomalies              Provocative Test(s):  Phalen's test: deferred Tinel's test: deferred Apley's scratch test (touch  opposite shoulder):  Action 1 (Across chest): deferred Action 2 (Overhead): deferred Action 3 (LB reach): deferred   Provocative Test(s):  Phalen's test: deferred Tinel's test: deferred Apley's scratch test (touch opposite shoulder):  Action 1 (Across chest): deferred Action 2 (Overhead): deferred Action 3 (LB reach): deferred    Thoracic Spine Area Exam  Skin & Axial Inspection: No masses, redness, or swelling Alignment: Symmetrical Functional ROM: Unrestricted ROM Stability: No instability detected Muscle Tone/Strength: Functionally intact. No obvious neuro-muscular anomalies detected. Sensory (Neurological): Unimpaired Muscle strength & Tone: No palpable anomalies  Lumbar Spine Area Exam  Skin & Axial Inspection: No masses, redness, or swelling Alignment: Symmetrical Functional ROM: Unrestricted ROM       Stability: No instability  detected Muscle Tone/Strength: Functionally intact. No obvious neuro-muscular anomalies detected. Sensory (Neurological): Improved during SCS trial Palpation: No palpable anomalies       Provocative Tests: Hyperextension/rotation test: deferred today       Lumbar quadrant test (Kemp's test): deferred today       Lateral bending test: deferred today       Patrick's Maneuver: deferred today                   FABER* test: deferred today                   S-I anterior distraction/compression test: deferred today         S-I lateral compression test: deferred today         S-I Thigh-thrust test: deferred today         S-I Gaenslen's test: deferred today         *(Flexion, ABduction and External Rotation)  Gait & Posture Assessment  Ambulation: Unassisted Gait: Relatively normal for age and body habitus Posture: WNL   Lower Extremity Exam    Side: Right lower extremity  Side: Left lower extremity  Stability: No instability observed          Stability: No instability observed          Skin & Extremity Inspection: Skin color, temperature, and hair growth are WNL. No peripheral edema or cyanosis. No masses, redness, swelling, asymmetry, or associated skin lesions. No contractures.  Skin & Extremity Inspection: Skin color, temperature, and hair growth are WNL. No peripheral edema or cyanosis. No masses, redness, swelling, asymmetry, or associated skin lesions. No contractures.  Functional ROM: Unrestricted ROM                  Functional ROM: Unrestricted ROM                  Muscle Tone/Strength: Functionally intact. No obvious neuro-muscular anomalies detected.  Muscle Tone/Strength: Functionally intact. No obvious neuro-muscular anomalies detected.  Sensory (Neurological): Improved during SCS trial        Sensory (Neurological): Improved during SCS trial        DTR: Patellar: deferred today Achilles: deferred today Plantar: deferred today  DTR: Patellar: deferred today Achilles: deferred  today Plantar: deferred today  Palpation: No palpable anomalies  Palpation: No palpable anomalies   Assessment   Status Diagnosis  Responding Responding Responding 1. Neuropathic pain of both legs   2. 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 Complex regional pain syndrome type 2 of both lower extremities   4. 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 Patient has tried various neuropathic medications including Lyrica 150 mg twice daily which resulted in blurry vision. Patient has a history of multiple eye surgeries including lens implants and replacements. Patient has also tried Topamax, Cymbalta which resulted in side effects of nausea and were primarily ineffective. He does find benefit with gabapentin 1200 mg twice daily.  Patient has also tried lidocaine infusions for his refractory neuropathy.Patient's EMG studies show severe axonal sensorimotor neuropathy in the legs, right median neuropathy at the wrist, right ulnar neuropathy.  Lower extremity symptoms are concerning for CRPS type II given extreme allodynia, intermittent swelling, increased sweat at times, hypersensitivity.  Patient has been evaluated by psychologist, Shanon Brow mount for SCS trial and was cleared from psych standpoint.   Patient follows up today for spinal cord stimulator trial lead removal.  Patient states that he had a very successful trial.  He endorses greater than 75% pain relief in regards to his paresthesias in his lower extremities.  He also endorses improved sleep as a result of the pain relief that he was experiencing from the spinal cord stimulator trial.  Lead placement was as follows which resulted in effective stimulation/coverage             Left trial lead: L1/2 Entry, Final Position superiorT9             Right trial  lead: L2/3 Entry, Final Position inferior T9  Leads were removed under live fluoroscopy with tips intact.  I will refer the patient to Dr. Cari Caraway for spinal cord stimulator permanent implant.  Provider-requested follow-up: Return if symptoms worsen or fail to improve.  Time Note: Greater than 50% of the 25 minute(s) of face-to-face time spent with Mr. Koplin, was spent in counseling/coordination of care regarding: Mr. Hallas primary cause of pain, the treatment plan, the risks and possible complications of proposed treatment, going over the informed consent, the results, interpretation and significance of  his recent diagnostic interventional treatment(s) and realistic expectations.  Orders Placed This Encounter  Procedures  . DG C-Arm 1-60 Min-No Report    Intraoperative interpretation by procedural physician at [place].    Standing Status:   Standing    Number of Occurrences:   1    Order Specific Question:   Reason for exam:    Answer:   Assistance in needle guidance and placement for procedures requiring needle placement in or near specific anatomical locations not easily accessible without such assistance.  . Ambulatory referral to Neurosurgery    Referral Priority:   Routine    Referral Type:   Surgical    Referral Reason:   Specialty Services Required    Referred to Provider:   Meade Maw, MD    Requested Specialty:   Neurosurgery    Number of Visits Requested:   1    No future appointments.  Primary Care Physician: Ronnell Freshwater, NP Location: [facility] Note by: Gillis Santa, M.D Date: 01/23/2019; Time: 10:23 AM  There are no Patient Instructions on file for this visit.

## 2019-01-23 NOTE — Progress Notes (Signed)
Safety precautions to be maintained throughout the outpatient stay will include: orient to surroundings, keep bed in low position, maintain call bell within reach at all times, provide assistance with transfer out of bed and ambulation.  

## 2019-01-24 ENCOUNTER — Other Ambulatory Visit: Payer: Self-pay | Admitting: Neurosurgery

## 2019-01-24 DIAGNOSIS — G894 Chronic pain syndrome: Secondary | ICD-10-CM

## 2019-01-28 ENCOUNTER — Ambulatory Visit: Payer: Medicare HMO | Admitting: Student in an Organized Health Care Education/Training Program

## 2019-01-28 ENCOUNTER — Telehealth: Payer: Self-pay | Admitting: Student in an Organized Health Care Education/Training Program

## 2019-01-28 NOTE — Telephone Encounter (Signed)
Pt called and stated that he is having the actual spinal cord stimulator put in on March 11th and would like to know if there is anything he can take for pain between now and then?

## 2019-01-28 NOTE — Telephone Encounter (Signed)
Left message

## 2019-01-28 NOTE — Telephone Encounter (Signed)
No- I'd continue with the current medications he is on.  No new suggestions for medications.

## 2019-01-28 NOTE — Telephone Encounter (Signed)
Dr Holley Raring, is this something he should discuss with the surgeon or does he need to make an appointment to discuss with you?

## 2019-01-30 ENCOUNTER — Telehealth: Payer: Self-pay | Admitting: Student in an Organized Health Care Education/Training Program

## 2019-01-30 NOTE — Telephone Encounter (Signed)
Patient is having scs w/ dr Izora Ribas on March 11. He is wondering if he can increase the vitam alpha liploic acid or if you know of anything that will help with balance issues he is having since SCS Trial. Please let him know

## 2019-01-31 NOTE — Telephone Encounter (Signed)
Suggestions?

## 2019-01-31 NOTE — Telephone Encounter (Signed)
Patient called back today and said he is having really bad cramps in his feet. Please call

## 2019-02-01 ENCOUNTER — Encounter
Admission: RE | Admit: 2019-02-01 | Discharge: 2019-02-01 | Disposition: A | Discharge status: Home / Self Care | Payer: Medicare HMO | Source: Ambulatory Visit | Attending: Neurosurgery | Admitting: Neurosurgery

## 2019-02-01 ENCOUNTER — Other Ambulatory Visit: Payer: Self-pay

## 2019-02-01 ENCOUNTER — Ambulatory Visit
Admission: RE | Admit: 2019-02-01 | Discharge: 2019-02-01 | Disposition: A | Discharge status: Home / Self Care | Payer: Medicare HMO | Source: Ambulatory Visit | Attending: Neurosurgery | Admitting: Neurosurgery

## 2019-02-01 DIAGNOSIS — G894 Chronic pain syndrome: Secondary | ICD-10-CM

## 2019-02-01 LAB — DIFFERENTIAL
Abs Immature Granulocytes: 0.04 10*3/uL (ref 0.00–0.07)
BASOS ABS: 0 10*3/uL (ref 0.0–0.1)
Basophils Relative: 1 %
EOS ABS: 0.1 10*3/uL (ref 0.0–0.5)
Eosinophils Relative: 1 %
IMMATURE GRANULOCYTES: 1 %
Lymphocytes Relative: 37 %
Lymphs Abs: 2.3 10*3/uL (ref 0.7–4.0)
Monocytes Absolute: 0.7 10*3/uL (ref 0.1–1.0)
Monocytes Relative: 10 %
Neutro Abs: 3.1 10*3/uL (ref 1.7–7.7)
Neutrophils Relative %: 50 %

## 2019-02-01 LAB — BASIC METABOLIC PANEL
Anion gap: 12 (ref 5–15)
BUN: 10 mg/dL (ref 6–20)
CALCIUM: 8.6 mg/dL — AB (ref 8.9–10.3)
CO2: 24 mmol/L (ref 22–32)
Chloride: 97 mmol/L — ABNORMAL LOW (ref 98–111)
Creatinine, Ser: 0.74 mg/dL (ref 0.61–1.24)
Glucose, Bld: 83 mg/dL (ref 70–99)
Potassium: 4 mmol/L (ref 3.5–5.1)
Sodium: 133 mmol/L — ABNORMAL LOW (ref 135–145)

## 2019-02-01 LAB — CBC
HCT: 39.8 % (ref 39.0–52.0)
Hemoglobin: 13.6 g/dL (ref 13.0–17.0)
MCH: 31.2 pg (ref 26.0–34.0)
MCHC: 34.2 g/dL (ref 30.0–36.0)
MCV: 91.3 fL (ref 80.0–100.0)
Platelets: 237 10*3/uL (ref 150–400)
RBC: 4.36 MIL/uL (ref 4.22–5.81)
RDW: 13.2 % (ref 11.5–15.5)
WBC: 6.2 10*3/uL (ref 4.0–10.5)
nRBC: 0 % (ref 0.0–0.2)

## 2019-02-01 LAB — URINALYSIS, ROUTINE W REFLEX MICROSCOPIC
Bilirubin Urine: NEGATIVE
GLUCOSE, UA: NEGATIVE mg/dL
Hgb urine dipstick: NEGATIVE
Ketones, ur: NEGATIVE mg/dL
Leukocytes,Ua: NEGATIVE
Nitrite: NEGATIVE
Protein, ur: NEGATIVE mg/dL
Specific Gravity, Urine: 1.003 — ABNORMAL LOW (ref 1.005–1.030)
pH: 7 (ref 5.0–8.0)

## 2019-02-01 LAB — SURGICAL PCR SCREEN
MRSA, PCR: NEGATIVE
Staphylococcus aureus: POSITIVE — AB

## 2019-02-01 LAB — APTT: aPTT: 32 seconds (ref 24–36)

## 2019-02-01 LAB — PROTIME-INR
INR: 1 (ref 0.8–1.2)
Prothrombin Time: 12.8 seconds (ref 11.4–15.2)

## 2019-02-01 NOTE — Pre-Procedure Instructions (Signed)
Pt states other than problems with neuropathy he would be able to walk a mile or climb a flight of stairs w/o SOB/CP.

## 2019-02-01 NOTE — Pre-Procedure Instructions (Signed)
Positive Staph PCR faxed to [facility] at Dr Izora Ribas office

## 2019-02-01 NOTE — Patient Instructions (Signed)
Your procedure is scheduled on: Wednesday 02/13/2019 Report to [facility]. To find out your arrival time please call [phone] between 1PM - 3PM on Tuesday 02/12/2019.  Remember: Instructions that are not followed completely may result in serious medical risk, up to and including death, or upon the discretion of your surgeon and anesthesiologist your surgery may need to be rescheduled.     _X__ 1. Do not eat food after midnight the night before your procedure.                 No gum chewing or hard candies. You may drink clear liquids up to 2 hours                 before you are scheduled to arrive for your surgery- DO not drink clear                 liquids within 2 hours of the start of your surgery.                 Clear Liquids include:  water, apple juice without pulp, clear carbohydrate                 drink such as Clearfast or Gatorade, Black Coffee or Tea (Do not add                 anything to coffee or tea).  __X__2.  On the morning of surgery brush your teeth with toothpaste and water, you                 may rinse your mouth with mouthwash if you wish.  Do not swallow any              toothpaste of mouthwash.     _X__ 3.  No Alcohol for 24 hours before or after surgery.   _X__ 4.  Do Not Smoke or use e-cigarettes For 24 Hours Prior to Your Surgery.                 Do not use any chewable tobacco products for at least 6 hours prior to                 surgery.  ____  5.  Bring all medications with you on the day of surgery if instructed.   __X__  6.  Notify your doctor if there is any change in your medical condition      (cold, fever, infections).     Do not wear jewelry, make-up, hairpins, clips or nail polish. Do not wear lotions, powders, or perfumes.  Do not shave 48 hours prior to surgery. Men may shave face and neck. Do not bring valuables to the hospital.    [facility] is not responsible for any belongings  or valuables.  Contacts, dentures/partials or body piercings may not be worn into surgery. Bring a case for your contacts, glasses or hearing aids, a denture cup will be supplied. Leave your suitcase in the car. After surgery it may be brought to your room. For patients admitted to the hospital, discharge time is determined by your treatment team.   Patients discharged the day of surgery will not be allowed to drive home.   Please read over the following fact sheets that you were given:   MRSA Information  __X__ Take these medicines the morning of surgery with A SIP OF WATER:  1. ALPRAZolam Duanne Moron)   2. amLODipine (NORVASC)  3. gabapentin (NEURONTIN)   4. metoprolol tartrate (LOPRESSOR)   5. sertraline (ZOLOFT  6.  ____ Fleet Enema (as directed)   __X__ Use CHG Soap/SAGE wipes as directed  ____ Use inhalers on the day of surgery  ____ Stop metformin/Janumet/Farxiga 2 days prior to surgery    ____ Take 1/2 of usual insulin dose the night before surgery. No insulin the morning          of surgery.   ____ Stop Blood Thinners Coumadin/Plavix/Xarelto/Pleta/Pradaxa/Eliquis/Effient/Aspirin  on   Or contact your Surgeon, Cardiologist or Medical Doctor regarding  ability to stop your blood thinners  __X__ Stop Anti-inflammatories 7 days before surgery such as Advil, Ibuprofen, Motrin,  BC or Goodies Powder, Naprosyn, Naproxen, Aleve, Aspirin    __X__ Stop all herbal supplements, fish oil or vitamin E until after surgery.    __X__ Bring C-Pap to the hospital.

## 2019-02-05 ENCOUNTER — Ambulatory Visit (INDEPENDENT_AMBULATORY_CARE_PROVIDER_SITE_OTHER): Payer: Medicare HMO | Admitting: Adult Health

## 2019-02-05 ENCOUNTER — Encounter: Payer: Self-pay | Admitting: Adult Health

## 2019-02-05 VITALS — BP 116/78 | HR 70 | Resp 16 | Ht 75.0 in | Wt 291.0 lb

## 2019-02-05 DIAGNOSIS — Z01818 Encounter for other preprocedural examination: Secondary | ICD-10-CM

## 2019-02-05 DIAGNOSIS — I10 Essential (primary) hypertension: Secondary | ICD-10-CM | POA: Diagnosis not present

## 2019-02-05 DIAGNOSIS — F411 Generalized anxiety disorder: Secondary | ICD-10-CM

## 2019-02-05 NOTE — Patient Instructions (Signed)

## 2019-02-05 NOTE — Progress Notes (Signed)
[facility] [place], [place] [address]  Internal MEDICINE  Office Visit Note  Patient Name: Jesus Crosby  [id number]  [id number]  Date of Service: 02/05/2019  Chief Complaint  Patient presents with  . Medical Clearance    surgery on next week, pre admissions did a ekg wants ekg check for clearance     HPI  Pt is here because he had a pre op appointment for his spinal stimulator surgery.  He had an EKG that shows Normal sinus rhythm Possible Acute pericarditis Abnormal ECG When compared with ECG of 04-Oct-2018 16:08, ST elevation now present in Anterolateral leads Nonspecific T wave abnormality no longer evident in Inferior leads Nonspecific T wave abnormality no longer evident in Lateral leads He does report that he has been worrying about his moms health a lot lately.  He has been having lots of stress, and he has anxiety.  His blood pressure is well controlled.  The surgeon is looking for clearance due to his EKG. I have confered with Dr. Clayborn Bigness and we approve of this patients surgical clearance at this time, provided he does not have HTN or chest pain between now and the procedure.    Current Medication: Outpatient Encounter Medications as of 02/05/2019  Medication Sig Note  . Alpha-Lipoic Acid 200 MG CAPS Take 1,600 mg by mouth daily. 1200 mg qam and 400 mg in the afternoon   . ALPRAZolam (XANAX) 0.5 MG tablet Take 0.5 mg by mouth 3 (three) times daily. 03/25/2014: .   Marland Kitchen AMBULATORY NON FORMULARY MEDICATION Medication Name:  Diltiazem 2 %/ Lidocaine 5 % Apply pea size amount 1 inch inside the anus 3 times daily. For 3 weeks. Or until symptoms resolve.   Marland Kitchen amLODipine (NORVASC) 5 MG tablet TAKE 1 TABLET(5 MG) BY MOUTH TWICE DAILY   . aspirin EC 81 MG tablet Take 81 mg by mouth at bedtime.    Marland Kitchen atorvastatin (LIPITOR) 10 MG tablet Take 1 tablet (10 mg total) by mouth daily. (Patient taking differently: Take 10 mg by mouth at bedtime. )   . Betaine  (CYSTADANE) POWD Take 8 Scoops by mouth 3 (three) times daily.   . cycloSPORINE (RESTASIS) 0.05 % ophthalmic emulsion Place 2 drops into both eyes 2 (two) times daily.    Marland Kitchen dicyclomine (BENTYL) 10 MG capsule Take 1 capsule (10 mg total) by mouth every 6 (six) hours as needed for spasms.   . folic acid (FOLVITE) 093 MCG tablet Take 1,200 mcg by mouth at bedtime.    . furosemide (LASIX) 20 MG tablet Take 1 tablet (20 mg total) by mouth every morning.   . gabapentin (NEURONTIN) 600 MG tablet Take 1,200 mg by mouth 2 (two) times daily.    Marland Kitchen losartan (COZAAR) 50 MG tablet TAKE 1/2 TABLET BY MOUTH EVERY DAY (Patient taking differently: Take 25 mg by mouth at bedtime. )   . metoprolol tartrate (LOPRESSOR) 50 MG tablet Take 1.5 tablets (75 mg total) by mouth 2 (two) times daily.   Marland Kitchen pyridOXINE (VITAMIN B-6) 100 MG tablet Take 500 mg by mouth at bedtime.    . ramelteon (ROZEREM) 8 MG tablet Take 1 tablet (8 mg total) by mouth at bedtime. (Patient not taking: Reported on 01/25/2019)   . sertraline (ZOLOFT) 100 MG tablet Take 300 mg by mouth daily.    . Suvorexant (BELSOMRA) 20 MG TABS Take 20 mg by mouth at bedtime.   . vitamin B-12 (CYANOCOBALAMIN) 1000 MCG tablet Take  1,000 mcg by mouth at bedtime.    No facility-administered encounter medications on file as of 02/05/2019.     Surgical History: Past Surgical History:  Procedure Laterality Date  . COLONOSCOPY WITH PROPOFOL N/A 07/30/2015   Procedure: COLONOSCOPY WITH PROPOFOL;  Surgeon: [name], MD;  Location: [facility];  Service: Endoscopy;  Laterality: N/A;  . DERMABRASION OF FACE     due to acne scars  . distal radius fracture of right hand (plates, screws, and pins)  2011  . ESOPHAGOGASTRODUODENOSCOPY (EGD) WITH PROPOFOL N/A 07/30/2015   Procedure: ESOPHAGOGASTRODUODENOSCOPY (EGD) WITH PROPOFOL;  Surgeon: [name], MD;  Location: [facility];  Service: Endoscopy;  Laterality: N/A;  . EYE SURGERY     LASER + SURG BIL   .  GAS/FLUID EXCHANGE Right 09/17/2013   Procedure: GAS/FLUID EXCHANGE;  Surgeon: Hayden Pedro, MD;  Location: [place];  Service: Ophthalmology;  Laterality: Right;  . gastric sleeve surgery  08/17/2016  . HIP ARTHROPLASTY Left   . INTRAOCULAR LENS INSERTION Bilateral    lens disease due to homocysteinuria  . JOINT REPLACEMENT Left    THR  . NASAL SEPTUM SURGERY  march 2016   @ [facility]  . PARS PLANA VITRECTOMY Right 09/17/2013   Procedure: PARS PLANA VITRECTOMY WITH 25G REMOVAL/SUTURE SECONDARY INTRAOCULAR LENS;  Surgeon: Hayden Pedro, MD;  Location: [place];  Service: Ophthalmology;  Laterality: Right;  . PHOTOCOAGULATION Right 09/17/2013   Procedure: PHOTOCOAGULATION;  Surgeon: Hayden Pedro, MD;  Location: [place];  Service: Ophthalmology;  Laterality: Right;  HEADSCOPE LASER  . scapholunate ligament reconstruction of right hand  2011  . UMBILICAL HERNIA REPAIR N/A 05/07/2015   Procedure: HERNIA REPAIR UMBILICAL ADULT;  Surgeon: Florene Glen, MD;  Location: [facility];  Service: General;  Laterality: N/A;  . WRIST SURGERY Right     Medical History: Past Medical History:  Diagnosis Date  . Anxiety   . Arthritis    knees,Right wrist  . Avascular necrosis ([place])   . Brain bleed ([place])   . Cataract    bilateral repair with lens implants  . Depression   . Family history of adverse reaction to anesthesia    n/v-mom  . GERD (gastroesophageal reflux disease)   . Homocystinuria ([place])   . Hypertension   . Idiopathic progressive polyneuropathy   . Lens disease   . Neuromuscular disorder ([place])   . Obesity, Class II, BMI 35-39.9   . OCD (obsessive compulsive disorder)   . Paresthesia 06/10/2015  . Sleep apnea   . Stroke [facility])    with brain bleed at age 47-no deficits  . Umbilical hernia     Family History: Family History  Problem Relation Age of Onset  . Breast cancer Mother   . Hypertension Mother   . Hypertension Father   . Stroke Maternal Grandmother   . Stroke Maternal  Grandfather   . Stroke Paternal Grandmother   . Stroke Paternal Grandfather   . Stroke Other   . Colon cancer Neg Hx   . Esophageal cancer Neg Hx   . Rectal cancer Neg Hx   . Stomach cancer Neg Hx     Social History   Socioeconomic History  . Marital status: Single    Spouse name: n/a  . Number of children: 0  . Years of education: college  . Highest education level: Not on file  Occupational History  . Occupation: unemployed/disabled    Comment: Lexicographer  Social Needs  .  Financial resource strain: Not hard at all  . Food insecurity:    Worry: Never true    Inability: Never true  . Transportation needs:    Medical: Yes    Non-medical: Yes  Tobacco Use  . Smoking status: Never Smoker  . Smokeless tobacco: Never Used  Substance and Sexual Activity  . Alcohol use: No    Alcohol/week: 0.0 standard drinks  . Drug use: No  . Sexual activity: Not on file  Lifestyle  . Physical activity:    Days per week: 0 days    Minutes per session: 0 min  . Stress: Not at all  Relationships  . Social connections:    Talks on phone: Once a week    Gets together: Once a week    Attends religious service: Never    Active member of club or organization: No    Attends meetings of clubs or organizations: Never    Relationship status: Never married  . Intimate partner violence:    Fear of current or ex partner: No    Emotionally abused: No    Physically abused: No    Forced sexual activity: No  Other Topics Concern  . Not on file  Social History Narrative   Lives alone. Parents live nearby.  Applying for disability (OCD) due to wrist injury, hearing upcoming.      Patient drinks 4-5 cups of caffeine daily.   Patient is right handed.      Review of Systems  Constitutional: Negative.  Negative for chills, fatigue and unexpected weight change.  HENT: Negative.  Negative for congestion, rhinorrhea, sneezing and sore throat.   Eyes: Negative for redness.  Respiratory:  Negative.  Negative for cough, chest tightness and shortness of breath.   Cardiovascular: Negative.  Negative for chest pain and palpitations.  Gastrointestinal: Negative.  Negative for abdominal pain, constipation, diarrhea, nausea and vomiting.  Endocrine: Negative.   Genitourinary: Negative.  Negative for dysuria and frequency.  Musculoskeletal: Negative.  Negative for arthralgias, back pain, joint swelling and neck pain.  Skin: Negative.  Negative for rash.  Allergic/Immunologic: Negative.   Neurological: Negative.  Negative for tremors and numbness.  Hematological: Negative for adenopathy. Does not bruise/bleed easily.  Psychiatric/Behavioral: Negative.  Negative for behavioral problems, sleep disturbance and suicidal ideas. The patient is not nervous/anxious.     Vital Signs: BP 116/78   Pulse 70   Resp 16   Ht 6\' 3"  (1.905 m)   Wt 291 lb (132 kg)   SpO2 93%   BMI 36.37 kg/m    Physical Exam Vitals signs and nursing note reviewed.  Constitutional:      General: He is not in acute distress.    Appearance: He is well-developed. He is not diaphoretic.  HENT:     Head: Normocephalic and atraumatic.     Mouth/Throat:     Pharynx: No oropharyngeal exudate.  Eyes:     Pupils: Pupils are equal, round, and reactive to light.  Neck:     Musculoskeletal: Normal range of motion and neck supple.     Thyroid: No thyromegaly.     Vascular: No JVD.     Trachea: No tracheal deviation.  Cardiovascular:     Rate and Rhythm: Normal rate and regular rhythm.     Heart sounds: Normal heart sounds. No murmur. No friction rub. No gallop.   Pulmonary:     Effort: Pulmonary effort is normal. No respiratory distress.     Breath sounds: Normal  breath sounds. No wheezing or rales.  Chest:     Chest wall: No tenderness.  Abdominal:     Palpations: Abdomen is soft.     Tenderness: There is no abdominal tenderness. There is no guarding.  Musculoskeletal: Normal range of motion.   Lymphadenopathy:     Cervical: No cervical adenopathy.  Skin:    General: Skin is warm and dry.  Neurological:     Mental Status: He is alert and oriented to person, place, and time.     Cranial Nerves: No cranial nerve deficit.  Psychiatric:        Behavior: Behavior normal.        Thought Content: Thought content normal.        Judgment: Judgment normal.    Assessment/Plan: 1. Preoperative examination Patient's EKG reviewed with Dr. Clayborn Bigness.  Patient has surgical clearance at this time. He does not have any chest pain, shortness of breath, or other issues between now his procedure.  2. GAD (generalized anxiety disorder) Stable, patient has been dealing with stress due to his mother's declining health.  He appears to be doing well with that at this time.  3. Essential hypertension, benign Stable, continue current medications as prescribed.  General Counseling: lamere lightner understanding of the findings of todays visit and agrees with plan of treatment. I have discussed any further diagnostic evaluation that may be needed or ordered today. We also reviewed his medications today. he has been encouraged to call the office with any questions or concerns that should arise related to todays visit.    No orders of the defined types were placed in this encounter.   No orders of the defined types were placed in this encounter.   Time spent: 25 Minutes   This patient was seen by Orson Gear AGNP-C in Collaboration with Dr [name] as a part of collaborative care agreement     Kendell Bane AGNP-C Internal medicine

## 2019-02-06 ENCOUNTER — Ambulatory Visit: Payer: Self-pay | Admitting: Adult Health

## 2019-02-06 ENCOUNTER — Telehealth: Payer: Self-pay

## 2019-02-06 NOTE — Telephone Encounter (Signed)
Faxed SX Clearance form to [facility] and put in scan . Beth

## 2019-02-07 ENCOUNTER — Ambulatory Visit: Payer: Self-pay | Admitting: Adult Health

## 2019-02-07 ENCOUNTER — Encounter: Payer: Self-pay | Admitting: Adult Health

## 2019-02-11 NOTE — Pre-Procedure Instructions (Signed)
PATIENT CONCERNED RER POSITIVE STAPH NASAL SWAB. INFORMED HIM DR Izora Ribas HAS COVERED WITH ANTIBIOTICS DAY OF SURGERY

## 2019-02-13 ENCOUNTER — Ambulatory Visit: Payer: Medicare HMO

## 2019-02-13 ENCOUNTER — Encounter
Admission: RE | Disposition: A | Discharge status: Home / Self Care | Payer: Self-pay | Source: Ambulatory Visit | Attending: Neurosurgery

## 2019-02-13 ENCOUNTER — Ambulatory Visit: Payer: Medicare HMO | Admitting: Anesthesiology

## 2019-02-13 ENCOUNTER — Other Ambulatory Visit: Payer: Self-pay

## 2019-02-13 ENCOUNTER — Ambulatory Visit
Admission: RE | Admit: 2019-02-13 | Discharge: 2019-02-13 | Disposition: A | Discharge status: Home / Self Care | Payer: Medicare HMO | Source: Ambulatory Visit | Attending: Neurosurgery | Admitting: Neurosurgery

## 2019-02-13 DIAGNOSIS — I10 Essential (primary) hypertension: Secondary | ICD-10-CM | POA: Insufficient documentation

## 2019-02-13 DIAGNOSIS — F411 Generalized anxiety disorder: Secondary | ICD-10-CM | POA: Insufficient documentation

## 2019-02-13 DIAGNOSIS — G4730 Sleep apnea, unspecified: Secondary | ICD-10-CM | POA: Diagnosis not present

## 2019-02-13 DIAGNOSIS — G894 Chronic pain syndrome: Secondary | ICD-10-CM | POA: Diagnosis present

## 2019-02-13 DIAGNOSIS — F329 Major depressive disorder, single episode, unspecified: Secondary | ICD-10-CM | POA: Insufficient documentation

## 2019-02-13 DIAGNOSIS — K219 Gastro-esophageal reflux disease without esophagitis: Secondary | ICD-10-CM | POA: Diagnosis not present

## 2019-02-13 DIAGNOSIS — Z8673 Personal history of transient ischemic attack (TIA), and cerebral infarction without residual deficits: Secondary | ICD-10-CM | POA: Diagnosis not present

## 2019-02-13 DIAGNOSIS — Z79899 Other long term (current) drug therapy: Secondary | ICD-10-CM | POA: Insufficient documentation

## 2019-02-13 DIAGNOSIS — Z7982 Long term (current) use of aspirin: Secondary | ICD-10-CM | POA: Diagnosis not present

## 2019-02-13 DIAGNOSIS — Z419 Encounter for procedure for purposes other than remedying health state, unspecified: Secondary | ICD-10-CM

## 2019-02-13 DIAGNOSIS — Z96642 Presence of left artificial hip joint: Secondary | ICD-10-CM | POA: Insufficient documentation

## 2019-02-13 HISTORY — PX: PULSE GENERATOR IMPLANT: SHX5370

## 2019-02-13 SURGERY — UNILATERAL PULSE GENERATOR IMPLANT
Anesthesia: General | Site: Back

## 2019-02-13 MED ORDER — KETAMINE HCL 50 MG/ML IJ SOLN
INTRAMUSCULAR | Status: DC | PRN
  Administered 2019-02-13: 25 mg via INTRAMUSCULAR
  Administered 2019-02-13: 25 mg via INTRAVENOUS

## 2019-02-13 MED ORDER — BUPIVACAINE HCL 0.5 % IJ SOLN
INTRAMUSCULAR | Status: DC | PRN
  Administered 2019-02-13: 10 mL

## 2019-02-13 MED ORDER — DEXTROSE 5 % IV SOLN
3.0000 g | INTRAVENOUS | Status: DC
  Filled 2019-02-13: qty 3000

## 2019-02-13 MED ORDER — DEXTROSE 5 % IV SOLN
3.0000 g | Freq: Once | INTRAVENOUS | Status: AC
  Administered 2019-02-13: 3 g via INTRAVENOUS
  Filled 2019-02-13: qty 3000

## 2019-02-13 MED ORDER — EPHEDRINE SULFATE 50 MG/ML IJ SOLN
INTRAMUSCULAR | Status: DC | PRN
  Administered 2019-02-13: 15 mg via INTRAVENOUS
  Administered 2019-02-13: 10 mg via INTRAVENOUS

## 2019-02-13 MED ORDER — THROMBIN 5000 UNITS EX SOLR
CUTANEOUS | Status: DC | PRN
  Administered 2019-02-13: 5000 [IU] via TOPICAL

## 2019-02-13 MED ORDER — FENTANYL CITRATE (PF) 100 MCG/2ML IJ SOLN
INTRAMUSCULAR | Status: AC
  Filled 2019-02-13: qty 2

## 2019-02-13 MED ORDER — LIDOCAINE HCL (CARDIAC) PF 100 MG/5ML IV SOSY
PREFILLED_SYRINGE | INTRAVENOUS | Status: DC | PRN
  Administered 2019-02-13: 50 mg via INTRAVENOUS

## 2019-02-13 MED ORDER — PROMETHAZINE HCL 25 MG/ML IJ SOLN: 6.2500 mg | INTRAMUSCULAR | Status: DC | PRN

## 2019-02-13 MED ORDER — SODIUM CHLORIDE 0.9 % IV SOLN
INTRAVENOUS | Status: DC | PRN
  Administered 2019-02-13: 40 mL

## 2019-02-13 MED ORDER — SODIUM CHLORIDE FLUSH 0.9 % IV SOLN
INTRAVENOUS | Status: AC
  Filled 2019-02-13: qty 10

## 2019-02-13 MED ORDER — BUPIVACAINE HCL (PF) 0.5 % IJ SOLN
INTRAMUSCULAR | Status: AC
  Filled 2019-02-13: qty 30

## 2019-02-13 MED ORDER — ACETAMINOPHEN 160 MG/5ML PO SOLN
325.0000 mg | ORAL | Status: DC | PRN
  Filled 2019-02-13: qty 20.3

## 2019-02-13 MED ORDER — BUPIVACAINE-EPINEPHRINE (PF) 0.25% -1:200000 IJ SOLN
INTRAMUSCULAR | Status: AC
  Filled 2019-02-13: qty 30

## 2019-02-13 MED ORDER — THROMBIN 5000 UNITS EX SOLR
CUTANEOUS | Status: AC
  Filled 2019-02-13: qty 5000

## 2019-02-13 MED ORDER — SUCCINYLCHOLINE CHLORIDE 20 MG/ML IJ SOLN
INTRAMUSCULAR | Status: DC | PRN
  Administered 2019-02-13: 100 mg via INTRAVENOUS

## 2019-02-13 MED ORDER — LIDOCAINE HCL (PF) 2 % IJ SOLN
INTRAMUSCULAR | Status: AC
  Filled 2019-02-13: qty 10

## 2019-02-13 MED ORDER — MIDAZOLAM HCL 2 MG/2ML IJ SOLN
INTRAMUSCULAR | Status: DC | PRN
  Administered 2019-02-13: 2 mg via INTRAVENOUS

## 2019-02-13 MED ORDER — SUCCINYLCHOLINE CHLORIDE 20 MG/ML IJ SOLN
INTRAMUSCULAR | Status: AC
  Filled 2019-02-13: qty 1

## 2019-02-13 MED ORDER — LACTATED RINGERS IV SOLN
INTRAVENOUS | Status: DC
  Administered 2019-02-13: 09:00:00 via INTRAVENOUS

## 2019-02-13 MED ORDER — FAMOTIDINE 20 MG PO TABS
ORAL_TABLET | ORAL | Status: AC
  Filled 2019-02-13: qty 1

## 2019-02-13 MED ORDER — ROCURONIUM BROMIDE 50 MG/5ML IV SOLN
INTRAVENOUS | Status: AC
  Filled 2019-02-13: qty 1

## 2019-02-13 MED ORDER — BUPIVACAINE HCL (PF) 0.5 % IJ SOLN
INTRAMUSCULAR | Status: AC
  Filled 2019-02-13: qty 10

## 2019-02-13 MED ORDER — BUPIVACAINE LIPOSOME 1.3 % IJ SUSP
INTRAMUSCULAR | Status: AC
  Filled 2019-02-13: qty 20

## 2019-02-13 MED ORDER — METHOCARBAMOL 500 MG PO TABS: 500.0000 mg | ORAL_TABLET | Freq: Four times a day (QID) | ORAL | 0 refills | Status: DC | PRN

## 2019-02-13 MED ORDER — GLYCOPYRROLATE 0.2 MG/ML IJ SOLN
INTRAMUSCULAR | Status: AC
  Filled 2019-02-13: qty 1

## 2019-02-13 MED ORDER — SODIUM CHLORIDE 0.9 % IR SOLN
Status: DC | PRN
  Administered 2019-02-13: 1000 mL

## 2019-02-13 MED ORDER — FAMOTIDINE 20 MG PO TABS
20.0000 mg | ORAL_TABLET | Freq: Once | ORAL | Status: AC
  Administered 2019-02-13: 20 mg via ORAL

## 2019-02-13 MED ORDER — VANCOMYCIN HCL 10 G IV SOLR
2000.0000 mg | Freq: Once | INTRAVENOUS | Status: AC
  Administered 2019-02-13: 2000 mg via INTRAVENOUS
  Filled 2019-02-13: qty 2000

## 2019-02-13 MED ORDER — PROPOFOL 10 MG/ML IV BOLUS
INTRAVENOUS | Status: AC
  Filled 2019-02-13: qty 20

## 2019-02-13 MED ORDER — DEXAMETHASONE SODIUM PHOSPHATE 10 MG/ML IJ SOLN
INTRAMUSCULAR | Status: AC
  Filled 2019-02-13: qty 1

## 2019-02-13 MED ORDER — PROPOFOL 10 MG/ML IV BOLUS
INTRAVENOUS | Status: DC | PRN
  Administered 2019-02-13: 200 mg via INTRAVENOUS

## 2019-02-13 MED ORDER — GLYCOPYRROLATE 0.2 MG/ML IJ SOLN
INTRAMUSCULAR | Status: DC | PRN
  Administered 2019-02-13: 0.2 mg via INTRAVENOUS

## 2019-02-13 MED ORDER — MEPERIDINE HCL 50 MG/ML IJ SOLN: 6.2500 mg | INTRAMUSCULAR | Status: DC | PRN

## 2019-02-13 MED ORDER — FENTANYL CITRATE (PF) 100 MCG/2ML IJ SOLN: 25.0000 ug | INTRAMUSCULAR | Status: DC | PRN

## 2019-02-13 MED ORDER — HYDROCODONE-ACETAMINOPHEN 7.5-325 MG PO TABS
1.0000 | ORAL_TABLET | Freq: Once | ORAL | Status: DC | PRN
  Filled 2019-02-13: qty 1

## 2019-02-13 MED ORDER — FENTANYL CITRATE (PF) 100 MCG/2ML IJ SOLN
INTRAMUSCULAR | Status: DC | PRN
  Administered 2019-02-13 (×4): 50 ug via INTRAVENOUS

## 2019-02-13 MED ORDER — OXYCODONE HCL 5 MG PO TABS: 5.0000 mg | ORAL_TABLET | ORAL | 0 refills | Status: DC | PRN

## 2019-02-13 MED ORDER — DEXAMETHASONE SODIUM PHOSPHATE 10 MG/ML IJ SOLN
INTRAMUSCULAR | Status: DC | PRN
  Administered 2019-02-13: 10 mg via INTRAVENOUS

## 2019-02-13 MED ORDER — BACITRACIN 50000 UNITS IM SOLR
INTRAMUSCULAR | Status: AC
  Filled 2019-02-13: qty 1

## 2019-02-13 MED ORDER — ROCURONIUM BROMIDE 100 MG/10ML IV SOLN
INTRAVENOUS | Status: DC | PRN
  Administered 2019-02-13: 25 mg via INTRAVENOUS
  Administered 2019-02-13: 5 mg via INTRAVENOUS

## 2019-02-13 MED ORDER — ONDANSETRON HCL 4 MG/2ML IJ SOLN
INTRAMUSCULAR | Status: DC | PRN
  Administered 2019-02-13: 4 mg via INTRAVENOUS

## 2019-02-13 MED ORDER — ONDANSETRON HCL 4 MG/2ML IJ SOLN
INTRAMUSCULAR | Status: AC
  Filled 2019-02-13: qty 2

## 2019-02-13 MED ORDER — MIDAZOLAM HCL 2 MG/2ML IJ SOLN
INTRAMUSCULAR | Status: AC
  Filled 2019-02-13: qty 2

## 2019-02-13 MED ORDER — BUPIVACAINE-EPINEPHRINE (PF) 0.5% -1:200000 IJ SOLN
INTRAMUSCULAR | Status: DC | PRN
  Administered 2019-02-13: 20 mL

## 2019-02-13 MED ORDER — ACETAMINOPHEN 325 MG PO TABS: 325.0000 mg | ORAL_TABLET | ORAL | Status: DC | PRN

## 2019-02-13 SURGICAL SUPPLY — 55 items
BUR NEURO DRILL SOFT 3.0X3.8M (BURR) ×2 IMPLANT
CANISTER SUCT 1200ML W/VALVE (MISCELLANEOUS) ×4 IMPLANT
CHLORAPREP W/TINT 26 (MISCELLANEOUS) ×4 IMPLANT
COUNTER NEEDLE 20/40 LG (NEEDLE) ×2 IMPLANT
COVER LIGHT HANDLE STERIS (MISCELLANEOUS) ×4 IMPLANT
COVER WAND RF STERILE (DRAPES) ×2 IMPLANT
CUP MEDICINE 2OZ PLAST GRAD ST (MISCELLANEOUS) ×2 IMPLANT
DERMABOND ADVANCED (GAUZE/BANDAGES/DRESSINGS) ×2
DERMABOND ADVANCED .7 DNX12 (GAUZE/BANDAGES/DRESSINGS) ×2 IMPLANT
DEVICE IMPLANT NEUROSTIMULATOR (Neuro Prosthesis/Implant) ×1 IMPLANT
DRAPE C-ARM 42X72 X-RAY (DRAPES) ×2 IMPLANT
DRAPE LAPAROTOMY 100X77 ABD (DRAPES) ×2 IMPLANT
DRAPE MICROSCOPE SPINE 48X150 (DRAPES) IMPLANT
DRAPE SURG 17X11 SM STRL (DRAPES) ×12 IMPLANT
ELECT CAUTERY BLADE TIP 2.5 (TIP) ×2
ELECT REM PT RETURN 9FT ADLT (ELECTROSURGICAL) ×2
ELECTRODE CAUTERY BLDE TIP 2.5 (TIP) ×1 IMPLANT
ELECTRODE REM PT RTRN 9FT ADLT (ELECTROSURGICAL) ×1 IMPLANT
FEE INTRAOP MONITOR IMPULS NCS (MISCELLANEOUS) IMPLANT
GLOVE BIOGEL PI IND STRL 7.0 (GLOVE) ×1 IMPLANT
GLOVE BIOGEL PI INDICATOR 7.0 (GLOVE) ×1
GLOVE SURG SYN 7.0 (GLOVE) ×4 IMPLANT
GLOVE SURG SYN 7.0 PF PI (GLOVE) ×2 IMPLANT
GLOVE SURG SYN 8.5  E (GLOVE) ×3
GLOVE SURG SYN 8.5 E (GLOVE) ×3 IMPLANT
GLOVE SURG SYN 8.5 PF PI (GLOVE) ×3 IMPLANT
GOWN SRG XL LVL 3 NONREINFORCE (GOWNS) ×1 IMPLANT
GOWN STRL NON-REIN TWL XL LVL3 (GOWNS) ×1
GOWN STRL REUS W/TWL MED LVL3 (GOWN DISPOSABLE) ×2 IMPLANT
GRADUATE 1200CC STRL 31836 (MISCELLANEOUS) ×2 IMPLANT
INTRAOP MONITOR FEE IMPULS NCS (MISCELLANEOUS)
INTRAOP MONITOR FEE IMPULSE (MISCELLANEOUS)
KIT TURNOVER KIT A (KITS) ×2 IMPLANT
MARKER SKIN DUAL TIP RULER LAB (MISCELLANEOUS) ×2 IMPLANT
NDL SAFETY ECLIPSE 18X1.5 (NEEDLE) ×1 IMPLANT
NEEDLE HYPO 18GX1.5 SHARP (NEEDLE) ×1
NEEDLE HYPO 22GX1.5 SAFETY (NEEDLE) ×2 IMPLANT
NS IRRIG 1000ML POUR BTL (IV SOLUTION) ×2 IMPLANT
PACK LAMINECTOMY NEURO (CUSTOM PROCEDURE TRAY) ×2 IMPLANT
PASSER CATH 38CM DISP (INSTRUMENTS) ×1 IMPLANT
RECHARGER INTELLIS (NEUROSURGERY SUPPLIES) ×1 IMPLANT
REPROGRAMMER INTELLIS (NEUROSURGERY SUPPLIES) ×1 IMPLANT
SPOGE SURGIFLO 8M (HEMOSTASIS)
SPONGE SURGIFLO 8M (HEMOSTASIS) IMPLANT
STAPLER SKIN PROX 35W (STAPLE) IMPLANT
STIMULATOR CORD SURESCAN MRI (Stimulator) ×1 IMPLANT
SUT SILK 2 0SH CR/8 30 (SUTURE) ×2 IMPLANT
SUT V-LOC 90 ABS DVC 3-0 CL (SUTURE) ×4 IMPLANT
SUT VIC AB 0 CT1 18XCR BRD 8 (SUTURE) ×1 IMPLANT
SUT VIC AB 0 CT1 8-18 (SUTURE) ×1
SUT VIC AB 2-0 CT1 18 (SUTURE) ×2 IMPLANT
SYR 10ML LL (SYRINGE) ×2 IMPLANT
SYR 30ML LL (SYRINGE) ×4 IMPLANT
TOWEL OR 17X26 4PK STRL BLUE (TOWEL DISPOSABLE) ×6 IMPLANT
TUBING CONNECTING 10 (TUBING) ×2 IMPLANT

## 2019-02-13 NOTE — Anesthesia Postprocedure Evaluation (Signed)
Anesthesia Post Note  Patient: Jesus Crosby.  Procedure(s) Performed: UNILATERAL PULSE GENERATOR IMPLANT (N/A Back)  Patient location during evaluation: PACU Anesthesia Type: General Level of consciousness: awake and alert Pain management: pain level controlled Vital Signs Assessment: post-procedure vital signs reviewed and stable Respiratory status: spontaneous breathing, nonlabored ventilation and respiratory function stable Cardiovascular status: blood pressure returned to baseline and stable Postop Assessment: no apparent nausea or vomiting Anesthetic complications: no     Last Vitals:  Vitals:   02/13/19 1313 02/13/19 1358  BP: 135/80 130/72  Pulse: 86 86  Resp: 18   Temp: 36.4 C   SpO2: 96% 96%    Last Pain:  Vitals:   02/13/19 1313  TempSrc: Tympanic  PainSc: 0-No pain                 Alphonsus Sias

## 2019-02-13 NOTE — Anesthesia Preprocedure Evaluation (Signed)
Anesthesia Evaluation  Patient identified by MRN, date of birth, ID band Patient awake    Reviewed: Allergy & Precautions, H&P , NPO status , reviewed documented beta blocker date and time   History of Anesthesia Complications (+) Family history of anesthesia reaction and history of anesthetic complications  Airway Mallampati: II  TM Distance: >3 FB Neck ROM: full    Dental  (+) Chipped, Caps   Pulmonary sleep apnea ,    Pulmonary exam normal        Cardiovascular hypertension, Normal cardiovascular exam  Medical clearance noted for ST elevation   Neuro/Psych PSYCHIATRIC DISORDERS Anxiety Depression  Neuromuscular disease CVA    GI/Hepatic GERD  Medicated and Controlled,  Endo/Other    Renal/GU      Musculoskeletal   Abdominal   Peds  Hematology   Anesthesia Other Findings Past Medical History: No date: Anxiety No date: Arthritis     Comment:  knees,Right wrist No date: Avascular necrosis ([facility]) No date: Brain bleed ([facility]) No date: Cataract     Comment:  bilateral repair with lens implants No date: Depression No date: Family history of adverse reaction to anesthesia     Comment:  n/v-mom No date: GERD (gastroesophageal reflux disease) No date: Homocystinuria ([place]) No date: Hypertension No date: Idiopathic progressive polyneuropathy No date: Lens disease No date: Neuromuscular disorder ([place]) No date: Obesity, Class II, BMI 35-39.9 No date: OCD (obsessive compulsive disorder) 06/10/2015: Paresthesia No date: Sleep apnea No date: Stroke [facility])     Comment:  with brain bleed at age 34-no deficits No date: Umbilical hernia Past Surgical History: 07/30/2015: COLONOSCOPY WITH PROPOFOL; N/A     Comment:  Procedure: COLONOSCOPY WITH PROPOFOL;  Surgeon: [name], MD;  [facility];  Service: Endoscopy;              Laterality: N/A; No date: DERMABRASION OF FACE     Comment:  due to  acne scars 2011: distal radius fracture of right hand (plates, screws, and pins) 07/30/2015: ESOPHAGOGASTRODUODENOSCOPY (EGD) WITH PROPOFOL; N/A     Comment:  Procedure: ESOPHAGOGASTRODUODENOSCOPY (EGD) WITH               PROPOFOL;  Surgeon: [name], MD;  [facility];  Service: Endoscopy;  Laterality: N/A; No date: EYE SURGERY     Comment:  LASER + SURG BIL  09/17/2013: GAS/FLUID EXCHANGE; Right     Comment:  Procedure: GAS/FLUID EXCHANGE;  Surgeon: Hayden Pedro, MD;  Location: [place];  Service: Ophthalmology;               Laterality: Right; 08/17/2016: gastric sleeve surgery No date: HIP ARTHROPLASTY; Left No date: INTRAOCULAR LENS INSERTION; Bilateral     Comment:  lens disease due to homocysteinuria No date: JOINT REPLACEMENT; Left     Comment:  THR march 2016: NASAL SEPTUM SURGERY     Comment:  @ [facility] 09/17/2013: PARS PLANA VITRECTOMY; Right     Comment:  Procedure: PARS PLANA VITRECTOMY WITH 25G REMOVAL/SUTURE              SECONDARY INTRAOCULAR LENS;  Surgeon: Hayden Pedro,               MD;  Location: [place];  Service: Ophthalmology;                Laterality: Right; 09/17/2013: PHOTOCOAGULATION; Right     Comment:  Procedure: PHOTOCOAGULATION;  Surgeon: Hayden Pedro,               MD;  Location: [place];  Service: Ophthalmology;                Laterality: Right;  HEADSCOPE LASER 2011: scapholunate ligament reconstruction of right hand 0/02/2121: UMBILICAL HERNIA REPAIR; N/A     Comment:  Procedure: HERNIA REPAIR UMBILICAL ADULT;  Surgeon:               Florene Glen, MD;  Location: [facility];  Service:               General;  Laterality: N/A; No date: WRIST SURGERY; Right   Reproductive/Obstetrics                             Anesthesia Physical Anesthesia Plan  ASA: III  Anesthesia Plan: General   Post-op Pain Management:    Induction: Intravenous  PONV Risk Score and Plan: 2 and  Ondansetron, Treatment may vary due to age or medical condition and Midazolam  Airway Management Planned: Oral ETT  Additional Equipment:   Intra-op Plan:   Post-operative Plan: Extubation in OR  Informed Consent: I have reviewed the patients History and Physical, chart, labs and discussed the procedure including the risks, benefits and alternatives for the proposed anesthesia with the patient or authorized representative who has indicated his/her understanding and acceptance.     Dental Advisory Given  Plan Discussed with: CRNA  Anesthesia Plan Comments:         Anesthesia Quick Evaluation

## 2019-02-13 NOTE — H&P (Signed)
I have reviewed and confirmed my history and physical from 01/24/2019 with no additions or changes. Plan for spinal cord stimulator placement.  Risks and benefits reviewed.  Heart sounds normal no MRG. Chest Clear to Auscultation Bilaterally.

## 2019-02-13 NOTE — Anesthesia Procedure Notes (Signed)
Procedure Name: Intubation Performed by: Rylin Seavey, CRNA Pre-anesthesia Checklist: Patient identified, Patient being monitored, Timeout performed, Emergency Drugs available and Suction available Patient Re-evaluated:Patient Re-evaluated prior to induction Oxygen Delivery Method: Circle system utilized Preoxygenation: Pre-oxygenation with 100% oxygen Induction Type: IV induction Ventilation: Mask ventilation without difficulty Laryngoscope Size: Miller and 2 Grade View: Grade I Tube type: Oral Tube size: 7.5 mm Number of attempts: 1 Airway Equipment and Method: Stylet Placement Confirmation: ETT inserted through vocal cords under direct vision,  positive ETCO2 and breath sounds checked- equal and bilateral Secured at: 23 cm Tube secured with: Tape Dental Injury: Teeth and Oropharynx as per pre-operative assessment        

## 2019-02-13 NOTE — Transfer of Care (Signed)
Immediate Anesthesia Transfer of Care Note  Patient: Jesus Crosby.  Procedure(s) Performed: UNILATERAL PULSE GENERATOR IMPLANT (N/A Back)  Patient Location: PACU  Anesthesia Type:General  Level of Consciousness: awake, alert  and oriented  Airway & Oxygen Therapy: Patient Spontanous Breathing and Patient connected to face mask oxygen  Post-op Assessment: Report given to RN and Post -op Vital signs reviewed and stable  Post vital signs: Reviewed  Last Vitals:  Vitals Value Taken Time  BP 136/87 02/13/2019 12:03 PM  Temp    Pulse 91 02/13/2019 12:03 PM  Resp 17 02/13/2019 12:03 PM  SpO2 97 % 02/13/2019 12:03 PM  Vitals shown include unvalidated device data.  Last Pain:  Vitals:   02/13/19 0823  TempSrc: Tympanic  PainSc: 9          Complications: No apparent anesthesia complications

## 2019-02-13 NOTE — Anesthesia Post-op Follow-up Note (Signed)
Anesthesia QCDR form completed.        

## 2019-02-13 NOTE — Op Note (Signed)
Indications: the patient is a 53 yo male who was diagnosed with chronic pain syndrome. The patient had a successful trial for spinal cord stimulation, so was consented for placement of a permanent device   Findings: successful placement of a [employer] spinal cord stimulator. IPG Model number [id number], Lead model number [id number]   Preoperative Diagnosis: Post laminectomy pain syndrome Postoperative Diagnosis: same     EBL: 50 ml IVF: 600 ml Drains: none Disposition: Extubated and Stable to PACU Complications: none   No foley catheter was placed.     Preoperative Note:    Risks of surgery discussed in clinic.   Operative Note:      The patient was then brought from the preoperative center with intravenous access established.  The patient underwent general anesthesia and endotracheal tube intubation, then was rotated on the [facility] table where all pressure points were appropriately padded.  An incision was marked with flouroscopy at T10/11, and on the buttock. The skin was then thoroughly cleansed.  Perioperative antibiotic prophylaxis was administered.  Sterile prep and drapes were then applied and a timeout was then observed.     Once this was complete an incision was opened with the use of a #10 blade knife in the midline at the thoracic incision.  The paraspinus muscled were subperiosteally dissected until the laminae of T10 and T11 were visualized. Flouroscopy was used to confirm the level. A self-retaining retractor was placed.   The rongeur was used to remove the spinous process of T10.  The drill was used to thin the bone until the ligamentum flavum was visualized.  The ligamentum was then removed and the dura visualized. This was widened until approximately 12 mm of dura was exposed to allow for placement of the paddle lead.     The lead was then advanced to the T9 pedicle at the top of the lead.  The lead was secured with a 2-0 silk suture. The lead was placed in a bacitracin soaked  sponge.   The incision on the right buttock was then opened and a pocket formed until it was large enough for the pulse generator.  The tunneler was used to connect between the pocket and the incision.  The lead was inserted into the tunneler and tunneled to the buttock.  The leads were attached to the IPG and impedances checked.  The leads were then tightened.  The IPG was then inserted into the pouch.   Both sites were irrigated.  The wounds were closed in layers with 0 and 2-0 vicryl.  The skin was approximated with monocryl. A sterile dressing was applied.     Patient was then rotated back to the preoperative bed awakened from anesthesia and taken to recovery all counts are correct in this case.   I performed the entire procedure with the assistance of Marin Olp PA as an Pensions consultant.     Meade Maw MD

## 2019-02-13 NOTE — Discharge Instructions (Addendum)
NEUROSURGERY DISCHARGE INSTRUCTIONS  The following are instructions to help in your recovery once you have been discharged from the hospital. Even if you feel well, it is important that you follow these activity guidelines.  What to do after you leave the hospital:  Recommended diet:  Increase protein intake to promote wound healing. You may return to your usual diet.  Be sure to stay hydrated.   Recommended activity: No bending, lifting, or twisting (BLT). Avoid lifting objects heavier than 10 pounds (gallon milk jug). Where possible, avoid household activities that involve lifting, bending, reaching, pushing, or pulling such as laundry, vacuuming, grocery shopping, and childcare. Try to arrange for help from friends and family for these activities while you heal.   Increase physical activity slowly as tolerated. Taking short walks is encouraged, but avoid strenuous exercise. Do not jog, run, bicycle, lift weights, or participate in any other exercises unless specifically allowed by your doctor.   You should not drive until cleared by your doctor.   Until released by your doctor, you should not return to work or school. You should rest at home and let your body heal.   You may shower the day after your surgery. After showering, lightly dab your incision dry. Do not take a tub bath or go swimming until approved by your doctor at your follow-up appointment.   If you smoke, we strongly recommend that you quit. Smoking has been proven to interfere with normal bone healing and will dramatically reduce the success rate of your surgery. Please contact [facility] (800-QUIT-NOW) and use the resources at www.[facility].com for assistance in stopping smoking.   Medications  Do not restart Aspirin until seven days after surgery  * Do not take anti-inflammatory medications for 3 days after surgery (naproxen [Aleve], ibuprofen [Advil, Motrin], celecoxib [Celebrex], etc.).   You may restart home  medications.   Wound Care Instructions  If you have a dressing on your incision, remove it two days after your surgery. Keep your incision area clean and dry.   If you have staples or stitches on your incision, you should have a follow up scheduled for removal. If you do not have staples or stitches, you will have steri-strips (small pieces of surgical tape) or Dermabond glue. The steri-strips/glue should begin to peel away within about a week (it is fine if the steri-strips fall off before then). If the strips are still in place one week after your surgery, you may gently remove them.    Please Report any of the following: Should you experience any of the following, contact us immediately:   New numbness or weakness   Pain that is progressively getting worse, and is not relieved by your pain medication, muscle relaxers, rest, and warm compresses   Bleeding, redness, swelling, pain, or drainage from surgical incision   Chills or flu-like symptoms   Fever greater than 101.0 F (38.3 C)   Inability to eat, drink fluids, or take medications   Problems with bowel or bladder functions   Difficulty breathing or shortness of breath   Warmth, tenderness, or swelling in your calf    Additional Follow up appointments During office hours (Monday-Friday 9 am to 5 pm), please call your physician at [phone] and ask for Berdine Addison.   After hours and weekends, please call the [place] Operator at [phone] and ask for the Neurosurgery Resident On Call   For a life-threatening emergency, call [place]   1) The drugs  that you were given will stay in your system until tomorrow so for the next 24 hours you should not:  A) Drive an automobile B) Make any legal decisions C) Drink any alcoholic beverage   2) You may resume regular meals tomorrow.  Today it is better to start with liquids and gradually work up to solid foods.  You may eat  anything you prefer, but it is better to start with liquids, then soup and crackers, and gradually work up to solid foods.   3) Please notify your doctor immediately if you have any unusual bleeding, trouble breathing, redness and pain at the surgery site, drainage, fever, or pain not relieved by medication.    4) Additional Instructions:        Please contact your physician with any problems or Same Day Surgery at [phone], Monday through Friday 6 am to 4 pm, or [place] at [facility] number at [phone].

## 2019-02-13 NOTE — Discharge Summary (Signed)
Procedure: Spinal cord stimulator hardware placement Procedure date: 02/13/2019 Diagnosis: Chronic pain syndrome  History: Jesus Crosby. is POD0 s/p SCS placement.  POD0: Seen in recovery still disoriented from anesthesia but able to questions and obey commands.  Complains of minimal back pain and persistent numbness in toes bilaterally that was present prior to surgery. SCS representative currently at bedside.  Denies any new pain/numbness/tingling at this time.  Physical Exam: Vitals:   02/13/19 0823  BP: (!) 140/91  Pulse: 78  Resp: 18  Temp: 99.6 F (37.6 C)  SpO2: 96%   Strength:5/5 throughout  Sensation: Intact and symmetric throughout lower extremities bilaterally  Data:  No results for input(s): NA, K, CL, CO2, BUN, CREATININE, LABGLOM, GLUCOSE, CALCIUM in the last 168 hours. No results for input(s): AST, ALT, ALKPHOS in the last 168 hours.  Invalid input(s): TBILI   No results for input(s): WBC, HGB, HCT, PLT in the last 168 hours. No results for input(s): APTT, INR in the last 168 hours.       Other tests/results: No imaging reviewed.   Assessment/Plan:  Jesus Crosby. is POD0 s/p SCS placement for chronic pain syndrome.  Tolerated procedure well without issue.  Neuro intact postoperatively.  We will continue postop pain control with Tylenol, oxycodone, Robaxin, ibuprofen as needed.  He is scheduled to follow-up in approximately 2 weeks to monitor progress.  Marin Olp PA-C Department of Neurosurgery

## 2019-02-14 ENCOUNTER — Encounter: Payer: Self-pay | Admitting: Neurosurgery

## 2019-02-17 ENCOUNTER — Other Ambulatory Visit: Payer: Self-pay | Admitting: Internal Medicine

## 2019-02-17 DIAGNOSIS — I10 Essential (primary) hypertension: Secondary | ICD-10-CM

## 2019-02-18 ENCOUNTER — Other Ambulatory Visit: Payer: Self-pay

## 2019-02-18 DIAGNOSIS — I10 Essential (primary) hypertension: Secondary | ICD-10-CM

## 2019-02-18 MED ORDER — AMLODIPINE BESYLATE 5 MG PO TABS: ORAL_TABLET | ORAL | 1 refills | Status: DC

## 2019-03-18 ENCOUNTER — Telehealth: Payer: Self-pay | Admitting: Podiatry

## 2019-03-18 ENCOUNTER — Telehealth: Payer: Self-pay | Admitting: Gastroenterology

## 2019-03-18 ENCOUNTER — Other Ambulatory Visit: Payer: Self-pay | Admitting: Gastroenterology

## 2019-03-18 MED ORDER — AMBULATORY NON FORMULARY MEDICATION: 2 refills | Status: DC

## 2019-03-18 MED ORDER — NONFORMULARY OR COMPOUNDED ITEM: 1 refills | Status: AC

## 2019-03-18 NOTE — Telephone Encounter (Signed)
Pt has questions about an cream price and changing pharmacy for cost. Please call when possible

## 2019-03-18 NOTE — Addendum Note (Signed)
Addended by: Graceann Congress D on: 03/18/2019 01:49 PM   Modules accepted: Orders

## 2019-03-18 NOTE — Telephone Encounter (Signed)
I spoke with patient, he was requesting a refill of Neuropathy cream and was using [facility], but was too expensive to ship.  He requested another pharmacy to have medication filled.  I informed him that I will send a refill to [facility] in [place], but he will need to see Dr. Amalia Hailey in June for follow up since it will be one year from last ov.  He agreed and was sent to [facility] for follow up appt.  Script for Neuropathy cream was called into [facility].

## 2019-03-18 NOTE — Telephone Encounter (Signed)
Spoke to patient who states he had a spinal stimulator placed and has become constipated from pain medications, due to the constipation he noted hie anal fissure has slightly opened up. Patient requesting a refill on Diltiazem and lidocaine. Faxed RX to [facility]. Patient will pick up tomorrow.

## 2019-04-19 ENCOUNTER — Other Ambulatory Visit: Payer: Self-pay | Admitting: Nurse Practitioner

## 2019-04-19 MED ORDER — CLOTRIMAZOLE 1 % EX CREA: 1.0000 "application " | TOPICAL_CREAM | Freq: Two times a day (BID) | CUTANEOUS | 1 refills | Status: DC

## 2019-04-19 NOTE — Telephone Encounter (Signed)
PATIENT CALLED ASKING IF WE COULD WRITE A RX FOR CREAM FOR FUNGAL INFECTION, PATIENT HAS SURGERY AND UNABLE TO [place] OR COME IN AT THIS TIME CAN WE PLEASE SEND RX, PER HEATHER THAT IS FINE

## 2019-04-30 DIAGNOSIS — L814 Other melanin hyperpigmentation: Secondary | ICD-10-CM | POA: Diagnosis not present

## 2019-04-30 DIAGNOSIS — D229 Melanocytic nevi, unspecified: Secondary | ICD-10-CM | POA: Diagnosis not present

## 2019-04-30 DIAGNOSIS — L821 Other seborrheic keratosis: Secondary | ICD-10-CM | POA: Diagnosis not present

## 2019-04-30 DIAGNOSIS — Z1283 Encounter for screening for malignant neoplasm of skin: Secondary | ICD-10-CM | POA: Diagnosis not present

## 2019-04-30 DIAGNOSIS — L820 Inflamed seborrheic keratosis: Secondary | ICD-10-CM | POA: Diagnosis not present

## 2019-04-30 DIAGNOSIS — G4733 Obstructive sleep apnea (adult) (pediatric): Secondary | ICD-10-CM | POA: Diagnosis not present

## 2019-04-30 DIAGNOSIS — D1800 Hemangioma unspecified site: Secondary | ICD-10-CM | POA: Diagnosis not present

## 2019-04-30 DIAGNOSIS — L281 Prurigo nodularis: Secondary | ICD-10-CM | POA: Diagnosis not present

## 2019-04-30 DIAGNOSIS — D225 Melanocytic nevi of trunk: Secondary | ICD-10-CM | POA: Diagnosis not present

## 2019-04-30 DIAGNOSIS — D485 Neoplasm of uncertain behavior of skin: Secondary | ICD-10-CM | POA: Diagnosis not present

## 2019-05-09 ENCOUNTER — Ambulatory Visit: Payer: Self-pay | Admitting: Adult Health

## 2019-05-10 ENCOUNTER — Ambulatory Visit: Payer: Self-pay | Admitting: Podiatry

## 2019-05-14 ENCOUNTER — Ambulatory Visit (INDEPENDENT_AMBULATORY_CARE_PROVIDER_SITE_OTHER): Payer: Medicare HMO | Admitting: Adult Health

## 2019-05-14 ENCOUNTER — Encounter: Payer: Self-pay | Admitting: Adult Health

## 2019-05-14 ENCOUNTER — Other Ambulatory Visit: Payer: Self-pay

## 2019-05-14 VITALS — BP 115/73 | HR 68 | Resp 16 | Ht 75.0 in | Wt 303.0 lb

## 2019-05-14 DIAGNOSIS — M79605 Pain in left leg: Secondary | ICD-10-CM

## 2019-05-14 DIAGNOSIS — G629 Polyneuropathy, unspecified: Secondary | ICD-10-CM

## 2019-05-14 DIAGNOSIS — M79604 Pain in right leg: Secondary | ICD-10-CM | POA: Diagnosis not present

## 2019-05-14 DIAGNOSIS — I10 Essential (primary) hypertension: Secondary | ICD-10-CM | POA: Diagnosis not present

## 2019-05-14 DIAGNOSIS — M7989 Other specified soft tissue disorders: Secondary | ICD-10-CM

## 2019-05-14 MED ORDER — METOPROLOL TARTRATE 50 MG PO TABS: ORAL_TABLET | ORAL | 0 refills | Status: DC

## 2019-05-14 MED ORDER — CYCLOSPORINE 0.05 % OP EMUL: 2.0000 [drp] | Freq: Two times a day (BID) | OPHTHALMIC | 0 refills | Status: DC

## 2019-05-14 NOTE — Progress Notes (Signed)
[facility] [place], [place] [address]  Internal MEDICINE  Office Visit Note  Patient Name: Jesus Crosby  [id number]  [id number]  Date of Service: 05/14/2019  Chief Complaint  Patient presents with  . Ankle Pain    recent surgery in March, having some pain left ankle down to toes, neuropathy in both feet. concerned with circulation     HPI  Pt is here 3 months post op from spinal cord stimulator placement.  Overall her reports his stimulator has worked however now he reports bilateral foot numbness, as well as tingling.  He has a history of neuropathy.  When he went for follow up appt the surgeon who placed the stimulator advised him to have his circulation checked.     Current Medication: Outpatient Encounter Medications as of 05/14/2019  Medication Sig Note  . Alpha-Lipoic Acid 200 MG CAPS Take 1,600 mg by mouth daily. 1200 mg qam and 400 mg in the afternoon   . ALPRAZolam (XANAX) 0.5 MG tablet Take 0.5 mg by mouth 3 (three) times daily. 03/25/2014: .   Marland Kitchen AMBULATORY NON FORMULARY MEDICATION Medication Name:  Diltiazem 2 %/ Lidocaine 5 % Apply pea size amount 1 inch inside the anus 3 times daily. For 3 weeks. Or until symptoms resolve.   Marland Kitchen amLODipine (NORVASC) 5 MG tablet TAKE 1 TABLET(5 MG) BY MOUTH TWICE DAILY   . aspirin EC 81 MG tablet Take 81 mg by mouth at bedtime.    Marland Kitchen atorvastatin (LIPITOR) 10 MG tablet Take 1 tablet (10 mg total) by mouth daily. (Patient taking differently: Take 10 mg by mouth at bedtime. )   . Betaine (CYSTADANE) POWD Take 8 Scoops by mouth 3 (three) times daily.   . clotrimazole (LOTRIMIN) 1 % cream Apply 1 application topically 2 (two) times daily.   . cycloSPORINE (RESTASIS) 0.05 % ophthalmic emulsion Place 2 drops into both eyes 2 (two) times daily.   Marland Kitchen dicyclomine (BENTYL) 10 MG capsule Take 1 capsule (10 mg total) by mouth every 6 (six) hours as needed for spasms.   . folic acid (FOLVITE) 970 MCG tablet Take 1,200 mcg by  mouth at bedtime.    . furosemide (LASIX) 20 MG tablet Take 1 tablet (20 mg total) by mouth every morning.   . gabapentin (NEURONTIN) 600 MG tablet Take 1,200 mg by mouth 2 (two) times daily.    Marland Kitchen losartan (COZAAR) 50 MG tablet TAKE 1/2 TABLET BY MOUTH EVERY DAY (Patient taking differently: Take 25 mg by mouth at bedtime. )   . methocarbamol (ROBAXIN) 500 MG tablet Take 1 tablet (500 mg total) by mouth every 6 (six) hours as needed for muscle spasms.   . metoprolol tartrate (LOPRESSOR) 50 MG tablet TAKE 1 AND 1/2 TABLETS(75 MG) BY MOUTH TWICE DAILY   . NONFORMULARY OR COMPOUNDED ITEM See pharmacy note   . oxyCODONE (ROXICODONE) 5 MG immediate release tablet Take 1 tablet (5 mg total) by mouth every 4 (four) hours as needed for breakthrough pain.   Marland Kitchen pyridOXINE (VITAMIN B-6) 100 MG tablet Take 500 mg by mouth at bedtime.    . sertraline (ZOLOFT) 100 MG tablet Take 300 mg by mouth daily.    . Suvorexant (BELSOMRA) 20 MG TABS Take 20 mg by mouth at bedtime.   . vitamin B-12 (CYANOCOBALAMIN) 1000 MCG tablet Take 1,000 mcg by mouth at bedtime.   . [DISCONTINUED] cycloSPORINE (RESTASIS) 0.05 % ophthalmic emulsion Place 2 drops into both eyes 2 (two) times  daily.    . [DISCONTINUED] metoprolol tartrate (LOPRESSOR) 50 MG tablet TAKE 1 AND 1/2 TABLETS(75 MG) BY MOUTH TWICE DAILY    No facility-administered encounter medications on file as of 05/14/2019.     Surgical History: Past Surgical History:  Procedure Laterality Date  . COLONOSCOPY WITH PROPOFOL N/A 07/30/2015   Procedure: COLONOSCOPY WITH PROPOFOL;  Surgeon: [name], MD;  Location: [facility];  Service: Endoscopy;  Laterality: N/A;  . DERMABRASION OF FACE     due to acne scars  . distal radius fracture of right hand (plates, screws, and pins)  2011  . ESOPHAGOGASTRODUODENOSCOPY (EGD) WITH PROPOFOL N/A 07/30/2015   Procedure: ESOPHAGOGASTRODUODENOSCOPY (EGD) WITH PROPOFOL;  Surgeon: [name], MD;  Location: [facility];   Service: Endoscopy;  Laterality: N/A;  . EYE SURGERY     LASER + SURG BIL   . GAS/FLUID EXCHANGE Right 09/17/2013   Procedure: GAS/FLUID EXCHANGE;  Surgeon: Hayden Pedro, MD;  Location: [place];  Service: Ophthalmology;  Laterality: Right;  . gastric sleeve surgery  08/17/2016  . HIP ARTHROPLASTY Left   . INTRAOCULAR LENS INSERTION Bilateral    lens disease due to homocysteinuria  . JOINT REPLACEMENT Left    THR  . NASAL SEPTUM SURGERY  march 2016   @ [facility]  . PARS PLANA VITRECTOMY Right 09/17/2013   Procedure: PARS PLANA VITRECTOMY WITH 25G REMOVAL/SUTURE SECONDARY INTRAOCULAR LENS;  Surgeon: Hayden Pedro, MD;  Location: [place];  Service: Ophthalmology;  Laterality: Right;  . PHOTOCOAGULATION Right 09/17/2013   Procedure: PHOTOCOAGULATION;  Surgeon: Hayden Pedro, MD;  Location: [place];  Service: Ophthalmology;  Laterality: Right;  HEADSCOPE LASER  . PULSE GENERATOR IMPLANT N/A 02/13/2019   Procedure: UNILATERAL PULSE GENERATOR IMPLANT;  Surgeon: Meade Maw, MD;  Location: [facility];  Service: Neurosurgery;  Laterality: N/A;  . scapholunate ligament reconstruction of right hand  2011  . UMBILICAL HERNIA REPAIR N/A 05/07/2015   Procedure: HERNIA REPAIR UMBILICAL ADULT;  Surgeon: Florene Glen, MD;  Location: [facility];  Service: General;  Laterality: N/A;  . WRIST SURGERY Right     Medical History: Past Medical History:  Diagnosis Date  . Anxiety   . Arthritis    knees,Right wrist  . Avascular necrosis ([place])   . Brain bleed ([place])   . Cataract    bilateral repair with lens implants  . Depression   . Family history of adverse reaction to anesthesia    n/v-mom  . GERD (gastroesophageal reflux disease)   . 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 Hypertension   . Idiopathic progressive polyneuropathy   . Lens disease   . Neuromuscular disorder ([place])   . Obesity, Class II, BMI 35-39.9   . OCD (obsessive compulsive disorder)   . Paresthesia 06/10/2015  . Sleep apnea   . Stroke [facility])     with brain bleed at age 9-no deficits  . Umbilical hernia     Family History: Family History  Problem Relation Age of Onset  . Breast cancer Mother   . Hypertension Mother   . Hypertension Father   . Stroke Maternal Grandmother   . Stroke Maternal Grandfather   . Stroke Paternal Grandmother   . Stroke Paternal Grandfather   . Stroke Other   . Colon cancer Neg Hx   . Esophageal cancer Neg Hx   . Rectal cancer Neg Hx   . Stomach cancer Neg Hx     Social History   Socioeconomic History  . Marital status:  Single    Spouse name: n/a  . Number of children: 0  . Years of education: college  . Highest education level: Not on file  Occupational History  . Occupation: unemployed/disabled    Comment: Lexicographer  Social Needs  . Financial resource strain: Not hard at all  . Food insecurity:    Worry: Never true    Inability: Never true  . Transportation needs:    Medical: Yes    Non-medical: Yes  Tobacco Use  . Smoking status: Never Smoker  . Smokeless tobacco: Never Used  Substance and Sexual Activity  . Alcohol use: No    Alcohol/week: 0.0 standard drinks  . Drug use: No  . Sexual activity: Not on file  Lifestyle  . Physical activity:    Days per week: 0 days    Minutes per session: 0 min  . Stress: Not at all  Relationships  . Social connections:    Talks on phone: Once a week    Gets together: Once a week    Attends religious service: Never    Active member of club or organization: No    Attends meetings of clubs or organizations: Never    Relationship status: Never married  . Intimate partner violence:    Fear of current or ex partner: No    Emotionally abused: No    Physically abused: No    Forced sexual activity: No  Other Topics Concern  . Not on file  Social History Narrative   Lives alone. Parents live nearby.  Applying for disability (OCD) due to wrist injury, hearing upcoming.      Patient drinks 4-5 cups of caffeine daily.    Patient is right handed.      Review of Systems  Constitutional: Negative.  Negative for chills, fatigue and unexpected weight change.  HENT: Negative.  Negative for congestion, rhinorrhea, sneezing and sore throat.   Eyes: Negative for redness.  Respiratory: Negative.  Negative for cough, chest tightness and shortness of breath.   Cardiovascular: Negative.  Negative for chest pain and palpitations.  Gastrointestinal: Negative.  Negative for abdominal pain, constipation, diarrhea, nausea and vomiting.  Endocrine: Negative.   Genitourinary: Negative.  Negative for dysuria and frequency.  Musculoskeletal: Negative.  Negative for arthralgias, back pain, joint swelling and neck pain.  Skin: Negative.  Negative for rash.  Allergic/Immunologic: Negative.   Neurological: Negative.  Negative for tremors and numbness.  Hematological: Negative for adenopathy. Does not bruise/bleed easily.  Psychiatric/Behavioral: Negative.  Negative for behavioral problems, sleep disturbance and suicidal ideas. The patient is not nervous/anxious.     Vital Signs: BP 115/73   Pulse 68   Resp 16   Ht 6\' 3"  (1.905 m)   Wt (!) 303 lb (137.4 kg)   SpO2 95%   BMI 37.87 kg/m    Physical Exam Vitals signs and nursing note reviewed.  Constitutional:      General: He is not in acute distress.    Appearance: He is well-developed. He is not diaphoretic.  HENT:     Head: Normocephalic and atraumatic.     Mouth/Throat:     Pharynx: No oropharyngeal exudate.  Eyes:     Pupils: Pupils are equal, round, and reactive to light.  Neck:     Musculoskeletal: Normal range of motion and neck supple.     Thyroid: No thyromegaly.     Vascular: No JVD.     Trachea: No tracheal deviation.  Cardiovascular:  Rate and Rhythm: Normal rate and regular rhythm.     Heart sounds: Normal heart sounds. No murmur. No friction rub. No gallop.   Pulmonary:     Effort: Pulmonary effort is normal. No respiratory distress.      Breath sounds: Normal breath sounds. No wheezing or rales.  Chest:     Chest wall: No tenderness.  Abdominal:     Palpations: Abdomen is soft.     Tenderness: There is no abdominal tenderness. There is no guarding.  Musculoskeletal: Normal range of motion.  Lymphadenopathy:     Cervical: No cervical adenopathy.  Skin:    General: Skin is warm and dry.  Neurological:     Mental Status: He is alert and oriented to person, place, and time.     Cranial Nerves: No cranial nerve deficit.  Psychiatric:        Behavior: Behavior normal.        Thought Content: Thought content normal.        Judgment: Judgment normal.    Assessment/Plan: 1. Neuropathy History of neuropathy, since spinal stimulator placed 3 months ago patient has had good improvement of pain.   2. Pain and swelling of lower extremity, right Will get doppler and [place] studies of BLE ABI done in office. See results.   3. Pain and swelling of lower extremity, left Will get doppler and [place] studies of BLE  4. Essential hypertension, benign Stable, continue current medications as tolerated.  General Counseling: Jesus Crosby understanding of the findings of todays visit and agrees with plan of treatment. I have discussed any further diagnostic evaluation that may be needed or ordered today. We also reviewed his medications today. he has been encouraged to call the office with any questions or concerns that should arise related to todays visit.    No orders of the defined types were placed in this encounter.   Meds ordered this encounter  Medications  . cycloSPORINE (RESTASIS) 0.05 % ophthalmic emulsion    Sig: Place 2 drops into both eyes 2 (two) times daily.    Dispense:  1.2 mL    Refill:  0  . metoprolol tartrate (LOPRESSOR) 50 MG tablet    Sig: TAKE 1 AND 1/2 TABLETS(75 MG) BY MOUTH TWICE DAILY    Dispense:  270 tablet    Refill:  0    Time spent: 20 Minutes   This patient was seen by Orson Gear AGNP-C in  Collaboration with Dr [name] as a part of collaborative care agreement     Jesus Crosby AGNP-C Internal medicine

## 2019-05-14 NOTE — Patient Instructions (Signed)
Neuropathic Pain Neuropathic pain is pain caused by damage to the nerves that are responsible for certain sensations in your body (sensory nerves). The pain can be caused by:  Damage to the sensory nerves that send signals to your spinal cord and brain (peripheral nervous system).  Damage to the sensory nerves in your brain or spinal cord (central nervous system). Neuropathic pain can make you more sensitive to pain. Even a minor sensation can feel very painful. This is usually a long-term condition that can be difficult to treat. The type of pain differs from person to person. It may:  Start suddenly (acute), or it may develop slowly and last for a long time (chronic).  Come and go as damaged nerves heal, or it may stay at the same level for years.  Cause emotional distress, loss of sleep, and a lower quality of life. What are the causes? The most common cause of this condition is diabetes. Many other diseases and conditions can also cause neuropathic pain. Causes of neuropathic pain can be classified as:  Toxic. This is caused by medicines and chemicals. The most common cause of toxic neuropathic pain is damage from cancer treatments (chemotherapy).  Metabolic. This can be caused by: ? Diabetes. This is the most common disease that damages the nerves. ? Lack of vitamin B from long-term alcohol abuse.  Traumatic. Any injury that cuts, crushes, or stretches a nerve can cause damage and pain. A common example is feeling pain after losing an arm or leg (phantom limb pain).  Compression-related. If a sensory nerve gets trapped or compressed for a long period of time, the blood supply to the nerve can be cut off.  Vascular. Many blood vessel diseases can cause neuropathic pain by decreasing blood supply and oxygen to nerves.  Autoimmune. This type of pain results from diseases in which the body's defense system (immune system) mistakenly attacks sensory nerves. Examples of autoimmune diseases  that can cause neuropathic pain include lupus and multiple sclerosis.  Infectious. Many types of viral infections can damage sensory nerves and cause pain. Shingles infection is a common cause of this type of pain.  Inherited. Neuropathic pain can be a symptom of many diseases that are passed down through families (genetic). What increases the risk? You are more likely to develop this condition if:  You have diabetes.  You smoke.  You drink too much alcohol.  You are taking certain medicines, including medicines that kill cancer cells (chemotherapy) or that treat immune system disorders. What are the signs or symptoms? The main symptom is pain. Neuropathic pain is often described as:  Burning.  Shock-like.  Stinging.  Hot or cold.  Itching. How is this diagnosed? No single test can diagnose neuropathic pain. It is diagnosed based on:  Physical exam and your symptoms. Your health care provider will ask you about your pain. You may be asked to use a pain scale to describe how bad your pain is.  Tests. These may be done to see if you have a high sensitivity to pain and to help find the cause and location of any sensory nerve damage. They include: ? Nerve conduction studies to test how well nerve signals travel through your sensory nerves (electrodiagnostic testing). ? Stimulating your sensory nerves through electrodes on your skin and measuring the response in your spinal cord and brain (somatosensory evoked potential).  Imaging studies, such as: ? X-rays. ? CT scan. ? MRI. How is this treated? Treatment for neuropathic pain may change   over time. You may need to try different treatment options or a combination of treatments. Some options include:  Treating the underlying cause of the neuropathy, such as diabetes, kidney disease, or vitamin deficiencies.  Stopping medicines that can cause neuropathy, such as chemotherapy.  Medicine to relieve pain. Medicines may include: ?  Prescription or over-the-counter pain medicine. ? Anti-seizure medicine. ? Antidepressant medicines. ? Pain-relieving patches that are applied to painful areas of skin. ? A medicine to numb the area (local anesthetic), which can be injected as a nerve block.  Transcutaneous nerve stimulation. This uses electrical currents to block painful nerve signals. The treatment is painless.  Alternative treatments, such as: ? Acupuncture. ? Meditation. ? Massage. ? Physical therapy. ? Pain management programs. ? Counseling. Follow these instructions at home: Medicines   Take over-the-counter and prescription medicines only as told by your health care provider.  Do not drive or use heavy machinery while taking prescription pain medicine.  If you are taking prescription pain medicine, take actions to prevent or treat constipation. Your health care provider may recommend that you: ? Drink enough fluid to keep your urine pale yellow. ? Eat foods that are high in fiber, such as fresh fruits and vegetables, whole grains, and beans. ? Limit foods that are high in fat and processed sugars, such as fried or sweet foods. ? Take an over-the-counter or prescription medicine for constipation. Lifestyle   Have a good support system at home.  Consider joining a chronic pain support group.  Do not use any products that contain nicotine or tobacco, such as cigarettes and e-cigarettes. If you need help quitting, ask your health care provider.  Do not drink alcohol. General instructions  Learn as much as you can about your condition.  Work closely with all your health care providers to find the treatment plan that works best for you.  Ask your health care provider what activities are safe for you.  Keep all follow-up visits as told by your health care provider. This is important. Contact a health care provider if:  Your pain treatments are not working.  You are having side effects from your  medicines.  You are struggling with tiredness (fatigue), mood changes, depression, or anxiety. Summary  Neuropathic pain is pain caused by damage to the nerves that are responsible for certain sensations in your body (sensory nerves).  Neuropathic pain may come and go as damaged nerves heal, or it may stay at the same level for years.  Neuropathic pain is usually a long-term condition that can be difficult to treat. Consider joining a chronic pain support group. This information is not intended to replace advice given to you by your health care provider. Make sure you discuss any questions you have with your health care provider. Document Released: 08/18/2004 Document Revised: 12/08/2017 Document Reviewed: 12/08/2017 Elsevier Interactive Patient Education  2019 [employer].  

## 2019-05-20 ENCOUNTER — Other Ambulatory Visit: Payer: Self-pay | Admitting: Adult Health

## 2019-05-20 ENCOUNTER — Other Ambulatory Visit: Payer: Self-pay

## 2019-05-20 MED ORDER — CYCLOSPORINE 0.05 % OP EMUL: 2.0000 [drp] | Freq: Two times a day (BID) | OPHTHALMIC | 0 refills | Status: DC

## 2019-05-21 ENCOUNTER — Other Ambulatory Visit: Payer: Self-pay | Admitting: Adult Health

## 2019-05-21 DIAGNOSIS — M79605 Pain in left leg: Secondary | ICD-10-CM

## 2019-05-21 DIAGNOSIS — M79604 Pain in right leg: Secondary | ICD-10-CM

## 2019-05-24 ENCOUNTER — Ambulatory Visit: Payer: Medicare HMO

## 2019-05-24 ENCOUNTER — Other Ambulatory Visit: Payer: Self-pay

## 2019-05-24 DIAGNOSIS — M79604 Pain in right leg: Secondary | ICD-10-CM | POA: Diagnosis not present

## 2019-05-24 DIAGNOSIS — M79605 Pain in left leg: Secondary | ICD-10-CM

## 2019-05-28 ENCOUNTER — Telehealth: Payer: Self-pay

## 2019-05-28 NOTE — Telephone Encounter (Signed)
Pt wanted to know if his ultrasound showed any  Blood clots because his orthopedics would like to prescribe his meloxicam. Adam reviewed his ultrasound and he did not see any blood clots. Notified pt and also notified patient that his ultrasound is in his chart and [facility] is able to look over it as well.

## 2019-05-29 DIAGNOSIS — F332 Major depressive disorder, recurrent severe without psychotic features: Secondary | ICD-10-CM | POA: Diagnosis not present

## 2019-06-12 ENCOUNTER — Telehealth: Payer: Self-pay | Admitting: Student in an Organized Health Care Education/Training Program

## 2019-06-12 NOTE — Telephone Encounter (Signed)
Pt called asking if Dr Holley Raring would increase his alpha laporic acid. I told pt he would most likely need a virtual med appt to discuss any changes to meds but pt insisted that I send a note back to the nurses first.

## 2019-06-13 ENCOUNTER — Telehealth: Payer: Self-pay

## 2019-06-13 NOTE — Telephone Encounter (Signed)
Attempted to call patient.  LM 

## 2019-06-13 NOTE — Telephone Encounter (Signed)
Attempted to call patient, message left. 

## 2019-06-13 NOTE — Telephone Encounter (Signed)
The patient called this morning wanting to speak to a nurse about questions he has about his medication.

## 2019-06-13 NOTE — Telephone Encounter (Signed)
Dr Holley Raring, patient states he spoke with [employer] and they informed him that there was a  SCS that could be placed to help his hands.  The patient would like to know if that is an option and if so, he will schedule an appointment to discuss with you.

## 2019-06-13 NOTE — Telephone Encounter (Signed)
Pt states he was returning [place] call and to call him back at [phone]

## 2019-06-17 ENCOUNTER — Ambulatory Visit: Payer: Medicare HMO | Admitting: Adult Health

## 2019-06-18 DIAGNOSIS — Q6611 Congenital talipes calcaneovarus, right foot: Secondary | ICD-10-CM | POA: Diagnosis not present

## 2019-06-18 DIAGNOSIS — Q6612 Congenital talipes calcaneovarus, left foot: Secondary | ICD-10-CM | POA: Diagnosis not present

## 2019-07-29 ENCOUNTER — Other Ambulatory Visit: Payer: Self-pay

## 2019-07-29 DIAGNOSIS — G4733 Obstructive sleep apnea (adult) (pediatric): Secondary | ICD-10-CM | POA: Diagnosis not present

## 2019-07-29 MED ORDER — FUROSEMIDE 20 MG PO TABS: 20.0000 mg | ORAL_TABLET | Freq: Every morning | ORAL | 3 refills | Status: DC

## 2019-08-01 ENCOUNTER — Ambulatory Visit: Payer: Self-pay | Admitting: Adult Health

## 2019-08-05 ENCOUNTER — Telehealth: Payer: Self-pay | Admitting: Gastroenterology

## 2019-08-05 NOTE — Telephone Encounter (Signed)
Pt reported that he has anal fissure and difficulty with BMs.  Please advise.

## 2019-08-05 NOTE — Telephone Encounter (Signed)
Left message on machine to call back  

## 2019-08-05 NOTE — Telephone Encounter (Signed)
The pt was last seen 2/4 for procedure.  He was advised to follow up if symptoms do not resolve.  He has been using lidocaine 5%/diltiazem 2% and symptoms did get some better but he has recently developed constipation and the fissure has returned.  He is taking miralax and citrucel.  He has been scheduled to come in for eval with Amy Treutlen on 9/9.  He was advised to increase miralax to twice daily and drink plenty of water.  He was also told not to rub the rectum when wiping but to pat and keep the area clean and dry.  The pt has been advised of the information and verbalized understanding.

## 2019-08-07 ENCOUNTER — Ambulatory Visit: Payer: Self-pay | Admitting: Adult Health

## 2019-08-08 ENCOUNTER — Telehealth: Payer: Self-pay | Admitting: Gastroenterology

## 2019-08-08 ENCOUNTER — Other Ambulatory Visit: Payer: Self-pay | Admitting: Adult Health

## 2019-08-08 DIAGNOSIS — I10 Essential (primary) hypertension: Secondary | ICD-10-CM

## 2019-08-08 NOTE — Telephone Encounter (Signed)
The pt called to ask about miralax and how much he should be taking.  He states he has not been taking it as he thought.  He was advised to take 1 capful daily and keep appt as scheduled for 9/9

## 2019-08-08 NOTE — Telephone Encounter (Signed)
Pt stated that he is having constipation, hx of anal fissure and would like to discuss.

## 2019-08-14 ENCOUNTER — Encounter: Payer: Self-pay | Admitting: Physician Assistant

## 2019-08-14 ENCOUNTER — Ambulatory Visit (INDEPENDENT_AMBULATORY_CARE_PROVIDER_SITE_OTHER): Payer: Medicare HMO | Admitting: Physician Assistant

## 2019-08-14 VITALS — BP 114/64 | HR 80 | Temp 98.0°F | Ht 72.25 in | Wt 316.4 lb

## 2019-08-14 DIAGNOSIS — Z6841 Body Mass Index (BMI) 40.0 and over, adult: Secondary | ICD-10-CM | POA: Diagnosis not present

## 2019-08-14 DIAGNOSIS — K602 Anal fissure, unspecified: Secondary | ICD-10-CM | POA: Diagnosis not present

## 2019-08-14 DIAGNOSIS — K5900 Constipation, unspecified: Secondary | ICD-10-CM

## 2019-08-14 DIAGNOSIS — E7211 Homocystinuria: Secondary | ICD-10-CM | POA: Diagnosis not present

## 2019-08-14 MED ORDER — AMBULATORY NON FORMULARY MEDICATION: 2 refills | Status: DC

## 2019-08-14 NOTE — Patient Instructions (Addendum)
If you are age 53 or older, your body mass index should be between 23-30. Your Body mass index is 42.61 kg/m. If this is out of the aforementioned range listed, please consider follow up with your Primary Care Provider.  If you are age 9 or younger, your body mass index should be between 19-25. Your Body mass index is 42.61 kg/m. If this is out of the aformentioned range listed, please consider follow up with your Primary Care Provider.   We have sent the following medications to your pharmacy for you to pick up at your convenience: Diltiazem with Lidocaine   [facility]  Continue Bentyl 10 mg twice daily as needed.  Start Miralax 1/2 - 1 dose daily in 8 ounces of water.  Call in 8 weeks if symptoms persist or at any point if worse call and speak with my nurse, Beth.  Thank you for choosing me and [facility].   Amy Esterwood, PA-C

## 2019-08-14 NOTE — Progress Notes (Signed)
Subjective:    Patient ID: Jesus Crosby., male    DOB: 10/30/1966, 53 y.o.   MRN: [id number]  HPI [name] is a pleasant 53 year old white male known to Dr. Ardis Hughs who comes in today with complaints of constipation and recurrent anal fissure. Patient has history of hypertension, sleep apnea, prior history of CVA, severe peripheral neuropathy and homocystinuria.  He had a spinal stimulator placed in March 2020 for the peripheral neuropathy.  Also with history of OCD, anxiety, degenerative disc disease and is status post laparoscopic sleeve gastrectomy. Patient has history of adenomatous colon polyps and prior anal fissure, as well as IBS. He had colonoscopy in February 2020 with finding of a small anal fissure and 3 small sessile polyps were removed.  He had random biopsies done for complaints of diarrhea and these were negative for microscopic colitis.  Path on the polyps were tubular adenomas. Patient says he had used lidocaine and diltiazem after the last colonoscopy for a while and symptoms improved but he is not sure they ever completely resolved.  Over the past couple of months he has developed significant increasing constipation, which on Further conversation with him may be due to increase in dose of gabapentin.  He said he had a lot of straining and feels like he developed a new or worsened anal fissure.  He is having pain on the left side internally.  He had been seeing blood with bowel movements but none over the past couple of weeks and is not currently having any severe pain.  He started on MiraLAX which was suggested when he called and says he is now having looser bowel movements just as of today. He also restarted the lidocaine compounded with diltiazem and has been using this 3 times a day over the past couple of weeks.  He has been using Bentyl as needed for pain and spasm.  Review of Systems Pertinent positive and negative review of systems were noted in the above HPI section.  All  other review of systems was otherwise negative.   Outpatient Encounter Medications as of 08/14/2019  Medication Sig  . Alpha-Lipoic Acid 200 MG CAPS Take 1,600 mg by mouth daily. 1200 mg qam and 400 mg in the afternoon  . ALPRAZolam (XANAX) 0.5 MG tablet Take 0.5 mg by mouth 3 (three) times daily.  . AMBULATORY NON FORMULARY MEDICATION Medication Name:  Diltiazem 2 %/ Lidocaine 5 % Apply pea size amount 1 inch inside the anus 3 times daily. For 8 weeks. Or until symptoms resolve.  Marland Kitchen amLODipine (NORVASC) 5 MG tablet TAKE 1 TABLET(5 MG) BY MOUTH TWICE DAILY  . aspirin EC 81 MG tablet Take 81 mg by mouth at bedtime.   Marland Kitchen atorvastatin (LIPITOR) 10 MG tablet Take 1 tablet (10 mg total) by mouth daily. (Patient taking differently: Take 10 mg by mouth at bedtime. )  . Betaine (CYSTADANE) POWD Take 8 Scoops by mouth 3 (three) times daily.  . clotrimazole (LOTRIMIN) 1 % cream Apply 1 application topically 2 (two) times daily.  . cycloSPORINE (RESTASIS) 0.05 % ophthalmic emulsion Place 2 drops into both eyes 2 (two) times daily.  Marland Kitchen dicyclomine (BENTYL) 10 MG capsule Take 1 capsule (10 mg total) by mouth every 6 (six) hours as needed for spasms. (Patient taking differently: Take 10 mg by mouth 2 (two) times daily. )  . folic acid (FOLVITE) A999333 MCG tablet Take 1,200 mcg by mouth at bedtime.   . furosemide (LASIX) 20 MG tablet Take 1  tablet (20 mg total) by mouth every morning.  . gabapentin (NEURONTIN) 800 MG tablet Take 1 tablet by mouth 2 (two) times daily.  Marland Kitchen losartan (COZAAR) 50 MG tablet TAKE 1/2 TABLET BY MOUTH EVERY DAY (Patient taking differently: Take 25 mg by mouth at bedtime. )  . methocarbamol (ROBAXIN) 500 MG tablet Take 1 tablet (500 mg total) by mouth every 6 (six) hours as needed for muscle spasms.  . Methylcellulose, Laxative, (CITRUCEL) 500 MG TABS Take 2 tablets by mouth 3 (three) times daily.  . metoprolol tartrate (LOPRESSOR) 50 MG tablet TAKE 1 AND 1/2 TABLETS(75 MG) BY MOUTH TWICE  DAILY  . NONFORMULARY OR COMPOUNDED ITEM See pharmacy note  . polyethylene glycol powder (GLYCOLAX/MIRALAX) 17 GM/SCOOP powder Take 17 g by mouth as needed.  . pyridOXINE (VITAMIN B-6) 100 MG tablet Take 500 mg by mouth at bedtime.   . sertraline (ZOLOFT) 100 MG tablet Take 300 mg by mouth daily.   . Suvorexant (BELSOMRA) 20 MG TABS Take 20 mg by mouth at bedtime.  . vitamin B-12 (CYANOCOBALAMIN) 1000 MCG tablet Take 1,000 mcg by mouth at bedtime.  . [DISCONTINUED] AMBULATORY NON FORMULARY MEDICATION Medication Name:  Diltiazem 2 %/ Lidocaine 5 % Apply pea size amount 1 inch inside the anus 3 times daily. For 3 weeks. Or until symptoms resolve.  . [DISCONTINUED] gabapentin (NEURONTIN) 600 MG tablet Take 1,200 mg by mouth 2 (two) times daily.   . [DISCONTINUED] oxyCODONE (ROXICODONE) 5 MG immediate release tablet Take 1 tablet (5 mg total) by mouth every 4 (four) hours as needed for breakthrough pain.   No facility-administered encounter medications on file as of 08/14/2019.    Allergies  Allergen Reactions  . Pineapple Swelling    Lips swelling  . Amoxicillin Nausea And Vomiting    Did it involve swelling of the face/tongue/throat, SOB, or low BP? No Did it involve sudden or severe rash/hives, skin peeling, or any reaction on the inside of your mouth or nose? No Did you need to seek medical attention at a hospital or doctor's office? No When did it last happen?10 + years If all above answers are "NO", may proceed with cephalosporin use.   Marland Kitchen Keflex [Cephalexin] Diarrhea   Patient Active Problem List   Diagnosis Date Noted  . Complex regional pain syndrome type 2 of both lower extremities 01/03/2019  . Neuropathic pain of both legs 01/03/2019  . Aphasia 10/04/2018  . Arthritis 04/26/2018  . Arthritis of knee 04/26/2018  . Bilateral cataracts 04/26/2018  . Class 1 obesity 04/26/2018  . Disorder of lens 04/26/2018  . Exomphalos 04/26/2018  . OSA on CPAP 04/26/2018  .  Prediabetes 04/26/2018  . History of total hip arthroplasty 07/05/2017  . Spinal stenosis of lumbar region 04/25/2017  . Trochanteric bursitis 02/27/2017  . S/P laparoscopic sleeve gastrectomy 08/17/2016  . Alcohol-induced mood disorder ([place])   . OCD (obsessive compulsive disorder)   . Paresthesia 06/10/2015  . Diplopia 08/15/2014  . Sinus tachycardia 08/15/2014  . CVA (cerebral infarction) 08/14/2014  . Neuropathy 04/04/2014  . Intraocular lens dislocation 09/12/2013  . DDD (degenerative disc disease), lumbosacral 07/04/2013  . HTN (hypertension) 07/04/2013  . Major depressive disorder, single episode 07/03/2009  . 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 HYPERLIPIDEMIA 05/28/2008  . ANXIETY 05/28/2008  . OBSESSIVE-COMPULSIVE DISORDER 05/28/2008   Social History   Socioeconomic History  . Marital status: Single    Spouse name: n/a  . Number of children: 0  .  Years of education: college  . Highest education level: Not on file  Occupational History  . Occupation: unemployed/disabled    Comment: Lexicographer  Social Needs  . Financial resource strain: Not hard at all  . Food insecurity    Worry: Never true    Inability: Never true  . Transportation needs    Medical: Yes    Non-medical: Yes  Tobacco Use  . Smoking status: Never Smoker  . Smokeless tobacco: Never Used  Substance and Sexual Activity  . Alcohol use: No    Alcohol/week: 0.0 standard drinks  . Drug use: No  . Sexual activity: Not on file  Lifestyle  . Physical activity    Days per week: 0 days    Minutes per session: 0 min  . Stress: Not at all  Relationships  . Social Herbalist on phone: Once a week    Gets together: Once a week    Attends religious service: Never    Active member of club or organization: No    Attends meetings of clubs or organizations: Never    Relationship status: Never married  . Intimate partner violence    Fear of current or  ex partner: No    Emotionally abused: No    Physically abused: No    Forced sexual activity: No  Other Topics Concern  . Not on file  Social History Narrative   Lives alone. Parents live nearby.  Applying for disability (OCD) due to wrist injury, hearing upcoming.      Patient drinks 4-5 cups of caffeine daily.   Patient is right handed.    Mr. Warmoth family history includes Breast cancer in his mother; Hypertension in his father and mother; Stroke in his maternal grandfather, maternal grandmother, paternal grandfather, paternal grandmother, and another family member.      Objective:    Vitals:   08/14/19 1346  BP: 114/64  Pulse: 80  Temp: 98 F (36.7 C)    Physical Exam Well-developed well-nourished obese WM  in no acute distress.  Height, [id number] BMI42.6  HEENT; nontraumatic normocephalic, EOMI, PER R LA, sclera anicteric. Oropharynx; not examined/wearing mask/COVID Neck; supple, no JVD Cardiovascular; regular rate and rhythm with S1-S2, no murmur rub or gallop Pulmonary; Clear bilaterally Abdomen; soft, obese, he is tender in the left mid quadrant no guarding or rebound, nondistended, no palpable mass or hepatosplenomegaly, bowel sounds are active Rectal; no external lesions noted he does have some maceration of the perianal skin.  On digital exam there is an anal fissure on the left at 4 o'clock position Skin; benign exam, no jaundice rash or appreciable lesions Extremities; no clubbing cyanosis or edema skin warm and dry Neuro/Psych; alert and oriented x4, grossly nonfocal mood and affect appropriate       Assessment & Plan:   #4 53 year old white male with history of previously documented anal fissure with recurrent anal pain and some hematochezia over the past several weeks in association with recent significant constipation.  On exam he does have an anal fissure on the left.  #2 constipation likely medication induced i.e. gabapentin #3 history of tubular  adenomatous colon polyps-up-to-date with colonoscopy just done February 2020-indicated for 5-year interval follow-up #4 severe peripheral neuropathy #5 homocystinuria 6.  OCD and anxiety 7.  Prior history of CVA 8.  Hypertension 9.  Status post sleeve gastrectomy for obesity  Plan; patient advised to continue MiraLAX on a daily basis, he can titrate dose as needed.  Advised if stools too loose to back off to half dose daily or 17 g every other day. Liberal water intake. Continue lidocaine 5% compounded with diltiazem 2% 3 times daily over the next 6 to 8 weeks.  He was advised to insert this 1-1/2 inches into the anus.  It patient advised to treat for at least a week beyond complete resolution of symptoms. He is asked to call back in 8 weeks.  If he has not had resolution of symptoms he will need to be reevaluated and/or referred to surgery.  Also asked to call if he has any worsening of symptoms in the interim.  Riva Sesma Genia Harold PA-C 08/14/2019   Cc: Ronnell Freshwater, NP

## 2019-08-15 NOTE — Progress Notes (Signed)
I agree with the above note, plan 

## 2019-08-16 DIAGNOSIS — Q6611 Congenital talipes calcaneovarus, right foot: Secondary | ICD-10-CM | POA: Diagnosis not present

## 2019-08-16 DIAGNOSIS — Q6612 Congenital talipes calcaneovarus, left foot: Secondary | ICD-10-CM | POA: Diagnosis not present

## 2019-08-19 ENCOUNTER — Other Ambulatory Visit: Payer: Self-pay | Admitting: Physician Assistant

## 2019-08-19 ENCOUNTER — Other Ambulatory Visit: Payer: Self-pay | Admitting: Adult Health

## 2019-08-19 DIAGNOSIS — I10 Essential (primary) hypertension: Secondary | ICD-10-CM

## 2019-08-21 DIAGNOSIS — F332 Major depressive disorder, recurrent severe without psychotic features: Secondary | ICD-10-CM | POA: Diagnosis not present

## 2019-08-21 DIAGNOSIS — F429 Obsessive-compulsive disorder, unspecified: Secondary | ICD-10-CM | POA: Diagnosis not present

## 2019-09-06 ENCOUNTER — Telehealth: Payer: Self-pay

## 2019-09-06 NOTE — Telephone Encounter (Signed)
PATIENT DROPPED OFF DISABILITY PLACARD PAPERWORK. PROVIDER COMPLETED AND SIGNED READY FOR PICKUP.

## 2019-10-22 ENCOUNTER — Other Ambulatory Visit: Payer: Self-pay | Admitting: Adult Health

## 2019-10-22 ENCOUNTER — Other Ambulatory Visit: Payer: Self-pay | Admitting: Internal Medicine

## 2019-10-22 ENCOUNTER — Telehealth: Payer: Self-pay

## 2019-10-22 ENCOUNTER — Telehealth: Payer: Self-pay | Admitting: Internal Medicine

## 2019-10-22 DIAGNOSIS — I10 Essential (primary) hypertension: Secondary | ICD-10-CM

## 2019-10-22 NOTE — Telephone Encounter (Signed)
Confirmed patient appt for 10/24/19. Jesus Crosby

## 2019-10-22 NOTE — Progress Notes (Signed)
LOSARTAN 25 MG PO QD

## 2019-10-22 NOTE — Telephone Encounter (Signed)
Called patient and left message and advised him he needs to be seen to continue refill medications, we did refill meds for 30 days and he needs an appointment for further refills. Beth

## 2019-10-23 ENCOUNTER — Other Ambulatory Visit: Payer: Self-pay

## 2019-10-23 MED ORDER — FUROSEMIDE 20 MG PO TABS: 20.0000 mg | ORAL_TABLET | Freq: Every day | ORAL | 0 refills | Status: DC

## 2019-10-24 ENCOUNTER — Ambulatory Visit (INDEPENDENT_AMBULATORY_CARE_PROVIDER_SITE_OTHER): Payer: Medicare HMO | Admitting: Adult Health

## 2019-10-24 ENCOUNTER — Other Ambulatory Visit: Payer: Self-pay

## 2019-10-24 ENCOUNTER — Encounter: Payer: Self-pay | Admitting: Adult Health

## 2019-10-24 VITALS — BP 130/75 | HR 73 | Ht 75.0 in | Wt 302.0 lb

## 2019-10-24 DIAGNOSIS — E7849 Other hyperlipidemia: Secondary | ICD-10-CM

## 2019-10-24 DIAGNOSIS — F411 Generalized anxiety disorder: Secondary | ICD-10-CM

## 2019-10-24 DIAGNOSIS — I10 Essential (primary) hypertension: Secondary | ICD-10-CM

## 2019-10-24 DIAGNOSIS — G4733 Obstructive sleep apnea (adult) (pediatric): Secondary | ICD-10-CM | POA: Diagnosis not present

## 2019-10-24 DIAGNOSIS — Z9989 Dependence on other enabling machines and devices: Secondary | ICD-10-CM | POA: Diagnosis not present

## 2019-10-24 MED ORDER — METOPROLOL TARTRATE 50 MG PO TABS: ORAL_TABLET | ORAL | 1 refills | Status: DC

## 2019-10-24 MED ORDER — AMLODIPINE BESYLATE 5 MG PO TABS: ORAL_TABLET | ORAL | 1 refills | Status: DC

## 2019-10-24 MED ORDER — ATORVASTATIN CALCIUM 10 MG PO TABS: 10.0000 mg | ORAL_TABLET | Freq: Every day | ORAL | 1 refills | Status: DC

## 2019-10-24 MED ORDER — LOSARTAN POTASSIUM 50 MG PO TABS: 25.0000 mg | ORAL_TABLET | Freq: Every day | ORAL | 1 refills | Status: DC

## 2019-10-24 MED ORDER — FUROSEMIDE 20 MG PO TABS: 20.0000 mg | ORAL_TABLET | Freq: Every day | ORAL | 1 refills | Status: DC

## 2019-10-24 NOTE — Progress Notes (Signed)
[facility] [place], [place] [address]  Internal MEDICINE  Telephone Visit  Patient Name: Jesus Crosby  [id number]  [id number]  Date of Service: 10/24/2019  I connected with the patient at 447 by telephone and verified the patients identity using two identifiers.   I discussed the limitations, risks, security and privacy concerns of performing an evaluation and management service by telephone and the availability of in person appointments. I also discussed with the patient that there may be a patient responsible charge related to the service.  The patient expressed understanding and agrees to proceed.    Chief Complaint  Patient presents with  . Telephone Assessment  . Telephone Screen  . Hypertension  . Depression    HPI  PT seen via telephone for follow up on HTN, HLD and depression. Overall he is doing well.  He denies current issues, or recent hospitalizations. He reports his bp is well controlled at this time. He Denies Chest pain, Shortness of breath, palpitations, headache, or blurred vision.    Current Medication: Outpatient Encounter Medications as of 10/24/2019  Medication Sig Note  . Alpha-Lipoic Acid 200 MG CAPS Take 1,600 mg by mouth daily. 1200 mg qam and 400 mg in the afternoon   . ALPRAZolam (XANAX) 0.5 MG tablet Take 0.5 mg by mouth 4 (four) times daily. By dr reddy 03/25/2014: .   Marland Kitchen AMBULATORY NON FORMULARY MEDICATION Medication Name:  Diltiazem 2 %/ Lidocaine 5 % Apply pea size amount 1 inch inside the anus 3 times daily. For 8 weeks. Or until symptoms resolve.   Marland Kitchen amLODipine (NORVASC) 5 MG tablet Take one tab po bid   . aspirin EC 81 MG tablet Take 81 mg by mouth at bedtime.    Marland Kitchen atorvastatin (LIPITOR) 10 MG tablet Take 1 tablet (10 mg total) by mouth at bedtime.   . Betaine (CYSTADANE) POWD Take 8 Scoops by mouth 3 (three) times daily.   . clotrimazole (LOTRIMIN) 1 % cream Apply 1 application topically 2 (two) times daily.   .  cycloSPORINE (RESTASIS) 0.05 % ophthalmic emulsion Place 2 drops into both eyes 2 (two) times daily.   Marland Kitchen dicyclomine (BENTYL) 10 MG capsule Take 1 capsule (10 mg total) by mouth 2 (two) times daily.   . folic acid (FOLVITE) A999333 MCG tablet Take 1,200 mcg by mouth at bedtime.    . furosemide (LASIX) 20 MG tablet Take 1 tablet (20 mg total) by mouth daily.   Marland Kitchen gabapentin (NEURONTIN) 800 MG tablet Take 2 tablets by mouth 2 (two) times daily.    Marland Kitchen losartan (COZAAR) 50 MG tablet Take 0.5 tablets (25 mg total) by mouth at bedtime.   . methocarbamol (ROBAXIN) 500 MG tablet Take 1 tablet (500 mg total) by mouth every 6 (six) hours as needed for muscle spasms.   . Methylcellulose, Laxative, (CITRUCEL) 500 MG TABS Take 2 tablets by mouth 3 (three) times daily.   . metoprolol tartrate (LOPRESSOR) 50 MG tablet Take 1 and 1/2 tablets by mouth twice daily   . NONFORMULARY OR COMPOUNDED ITEM See pharmacy note   . polyethylene glycol powder (GLYCOLAX/MIRALAX) 17 GM/SCOOP powder Take 17 g by mouth as needed.   . pyridOXINE (VITAMIN B-6) 100 MG tablet Take 500 mg by mouth at bedtime.    . sertraline (ZOLOFT) 100 MG tablet Take 300 mg by mouth daily.    . Suvorexant (BELSOMRA) 20 MG TABS Take 20 mg by mouth at bedtime.   . vitamin  B-12 (CYANOCOBALAMIN) 1000 MCG tablet Take 1,000 mcg by mouth at bedtime.   . [DISCONTINUED] amLODipine (NORVASC) 5 MG tablet TAKE 1 TABLET(5 MG) BY MOUTH TWICE DAILY   . [DISCONTINUED] atorvastatin (LIPITOR) 10 MG tablet Take 1 tablet (10 mg total) by mouth daily. (Patient taking differently: Take 10 mg by mouth at bedtime. )   . [DISCONTINUED] furosemide (LASIX) 20 MG tablet Take 1 tablet (20 mg total) by mouth daily.   . [DISCONTINUED] losartan (COZAAR) 50 MG tablet Take 0.5 tablets (25 mg total) by mouth at bedtime.   . [DISCONTINUED] metoprolol tartrate (LOPRESSOR) 50 MG tablet TAKE 1 AND 1/2 TABLETS(75 MG) BY MOUTH TWICE DAILY    No facility-administered encounter medications on  file as of 10/24/2019.     Surgical History: Past Surgical History:  Procedure Laterality Date  . COLONOSCOPY WITH PROPOFOL N/A 07/30/2015   Procedure: COLONOSCOPY WITH PROPOFOL;  Surgeon: [name], MD;  Location: [facility];  Service: Endoscopy;  Laterality: N/A;  . DERMABRASION OF FACE     due to acne scars  . distal radius fracture of right hand (plates, screws, and pins)  2011  . ESOPHAGOGASTRODUODENOSCOPY (EGD) WITH PROPOFOL N/A 07/30/2015   Procedure: ESOPHAGOGASTRODUODENOSCOPY (EGD) WITH PROPOFOL;  Surgeon: [name], MD;  Location: [facility];  Service: Endoscopy;  Laterality: N/A;  . EYE SURGERY     LASER + SURG BIL   . GAS/FLUID EXCHANGE Right 09/17/2013   Procedure: GAS/FLUID EXCHANGE;  Surgeon: Hayden Pedro, MD;  Location: [place];  Service: Ophthalmology;  Laterality: Right;  . gastric sleeve surgery  08/17/2016  . HIP ARTHROPLASTY Left   . INTRAOCULAR LENS INSERTION Bilateral    lens disease due to homocysteinuria  . JOINT REPLACEMENT Left    THR  . NASAL SEPTUM SURGERY  march 2016   @ [facility]  . PARS PLANA VITRECTOMY Right 09/17/2013   Procedure: PARS PLANA VITRECTOMY WITH 25G REMOVAL/SUTURE SECONDARY INTRAOCULAR LENS;  Surgeon: Hayden Pedro, MD;  Location: [place];  Service: Ophthalmology;  Laterality: Right;  . PHOTOCOAGULATION Right 09/17/2013   Procedure: PHOTOCOAGULATION;  Surgeon: Hayden Pedro, MD;  Location: [place];  Service: Ophthalmology;  Laterality: Right;  HEADSCOPE LASER  . PULSE GENERATOR IMPLANT N/A 02/13/2019   Procedure: UNILATERAL PULSE GENERATOR IMPLANT;  Surgeon: Meade Maw, MD;  Location: [facility];  Service: Neurosurgery;  Laterality: N/A;  . scapholunate ligament reconstruction of right hand  2011  . UMBILICAL HERNIA REPAIR N/A 05/07/2015   Procedure: HERNIA REPAIR UMBILICAL ADULT;  Surgeon: Florene Glen, MD;  Location: [facility];  Service: General;  Laterality: N/A;  . WRIST SURGERY Right     Medical History: Past  Medical History:  Diagnosis Date  . Anxiety   . Arthritis    knees,Right wrist  . Avascular necrosis ([place])   . Brain bleed ([place])   . Cataract    bilateral repair with lens implants  . Depression   . Family history of adverse reaction to anesthesia    n/v-mom  . GERD (gastroesophageal reflux disease)   . 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 Hypertension   . Idiopathic progressive polyneuropathy   . Lens disease   . Neuromuscular disorder ([place])   . Obesity, Class II, BMI 35-39.9   . OCD (obsessive compulsive disorder)   . Paresthesia 06/10/2015  . Sleep apnea   . Stroke [facility])    with brain bleed at age 89-no deficits  . Umbilical hernia     Family History: Family  History  Problem Relation Age of Onset  . Breast cancer Mother   . Hypertension Mother   . Hypertension Father   . Stroke Maternal Grandmother   . Stroke Maternal Grandfather   . Stroke Paternal Grandmother   . Stroke Paternal Grandfather   . Stroke Other   . Colon cancer Neg Hx   . Esophageal cancer Neg Hx   . Rectal cancer Neg Hx   . Stomach cancer Neg Hx     Social History   Socioeconomic History  . Marital status: Single    Spouse name: n/a  . Number of children: 0  . Years of education: college  . Highest education level: Not on file  Occupational History  . Occupation: unemployed/disabled    Comment: Lexicographer  Social Needs  . Financial resource strain: Not hard at all  . Food insecurity    Worry: Never true    Inability: Never true  . Transportation needs    Medical: Yes    Non-medical: Yes  Tobacco Use  . Smoking status: Never Smoker  . Smokeless tobacco: Never Used  Substance and Sexual Activity  . Alcohol use: No    Alcohol/week: 0.0 standard drinks  . Drug use: No  . Sexual activity: Not on file  Lifestyle  . Physical activity    Days per week: 0 days    Minutes per session: 0 min  . Stress: Not at all  Relationships  . Social Herbalist on phone: Once a week     Gets together: Once a week    Attends religious service: Never    Active member of club or organization: No    Attends meetings of clubs or organizations: Never    Relationship status: Never married  . Intimate partner violence    Fear of current or ex partner: No    Emotionally abused: No    Physically abused: No    Forced sexual activity: No  Other Topics Concern  . Not on file  Social History Narrative   Lives alone. Parents live nearby.  Applying for disability (OCD) due to wrist injury, hearing upcoming.      Patient drinks 4-5 cups of caffeine daily.   Patient is right handed.      Review of Systems  Constitutional: Negative.  Negative for chills, fatigue and unexpected weight change.  HENT: Negative.  Negative for congestion, rhinorrhea, sneezing and sore throat.   Eyes: Negative for redness.  Respiratory: Negative.  Negative for cough, chest tightness and shortness of breath.   Cardiovascular: Negative.  Negative for chest pain and palpitations.  Gastrointestinal: Negative.  Negative for abdominal pain, constipation, diarrhea, nausea and vomiting.  Endocrine: Negative.   Genitourinary: Negative.  Negative for dysuria and frequency.  Musculoskeletal: Negative.  Negative for arthralgias, back pain, joint swelling and neck pain.  Skin: Negative.  Negative for rash.  Allergic/Immunologic: Negative.   Neurological: Negative.  Negative for tremors and numbness.  Hematological: Negative for adenopathy. Does not bruise/bleed easily.  Psychiatric/Behavioral: Negative.  Negative for behavioral problems, sleep disturbance and suicidal ideas. The patient is not nervous/anxious.     Vital Signs: BP 130/75   Pulse 73   Ht 6\' 3"  (1.905 m)   Wt (!) 302 lb (137 kg)   BMI 37.75 kg/m    Observation/Objective: Well sounding, NAD noted.   Assessment/Plan:  1. Essential hypertension, benign Continue to take medications as directed, refilled at this time.  - amLODipine (NORVASC)  5 MG tablet; Take one tab po bid  Dispense: 180 tablet; Refill: 1 - metoprolol tartrate (LOPRESSOR) 50 MG tablet; Take 1 and 1/2 tablets by mouth twice daily  Dispense: 270 tablet; Refill: 1 - furosemide (LASIX) 20 MG tablet; Take 1 tablet (20 mg total) by mouth daily.  Dispense: 90 tablet; Refill: 1 - losartan (COZAAR) 50 MG tablet; Take 0.5 tablets (25 mg total) by mouth at bedtime.  Dispense: 45 tablet; Refill: 1 - CBC with Differential/Platelet - Lipid Panel With LDL/HDL Ratio - TSH - T4, free - Comprehensive metabolic panel - PSA  2. Other hyperlipidemia Take Lipitor as directed. - atorvastatin (LIPITOR) 10 MG tablet; Take 1 tablet (10 mg total) by mouth at bedtime.  Dispense: 90 tablet; Refill: 1  3. OSA on CPAP Continue with cpap therapy as ordered.   4. GAD (generalized anxiety disorder) Controlled, continue current management.   General Counseling: yaroslav zanin understanding of the findings of today's phone visit and agrees with plan of treatment. I have discussed any further diagnostic evaluation that may be needed or ordered today. We also reviewed his medications today. he has been encouraged to call the office with any questions or concerns that should arise related to todays visit.    No orders of the defined types were placed in this encounter.   Meds ordered this encounter  Medications  . amLODipine (NORVASC) 5 MG tablet    Sig: Take one tab po bid    Dispense:  180 tablet    Refill:  1  . metoprolol tartrate (LOPRESSOR) 50 MG tablet    Sig: Take 1 and 1/2 tablets by mouth twice daily    Dispense:  270 tablet    Refill:  1  . furosemide (LASIX) 20 MG tablet    Sig: Take 1 tablet (20 mg total) by mouth daily.    Dispense:  90 tablet    Refill:  1  . losartan (COZAAR) 50 MG tablet    Sig: Take 0.5 tablets (25 mg total) by mouth at bedtime.    Dispense:  45 tablet    Refill:  1  . atorvastatin (LIPITOR) 10 MG tablet    Sig: Take 1 tablet (10 mg total) by  mouth at bedtime.    Dispense:  90 tablet    Refill:  1    Time spent: [place] AGNP-C Internal medicine

## 2019-10-25 ENCOUNTER — Telehealth: Payer: Self-pay

## 2019-10-25 NOTE — Telephone Encounter (Signed)
Called lmom for patient to call to schedule physical, review labs and other tests in 2 weeks.

## 2019-10-29 DIAGNOSIS — G4733 Obstructive sleep apnea (adult) (pediatric): Secondary | ICD-10-CM | POA: Diagnosis not present

## 2019-11-06 DIAGNOSIS — Z79891 Long term (current) use of opiate analgesic: Secondary | ICD-10-CM | POA: Diagnosis not present

## 2019-11-11 ENCOUNTER — Telehealth: Payer: Self-pay | Admitting: Gastroenterology

## 2019-11-11 NOTE — Telephone Encounter (Signed)
Spoke with the patient. He has continued to have bleeding, burning and pain with defecation. He feels the fissure has persisted. He would like to go forwarded with a consult at [facility] with whomever you recommend.Okay to refer? Also would need a refill on the Diltiazem/lidocaine compound. Thanks

## 2019-11-11 NOTE — Telephone Encounter (Signed)
Called phone number given. No answer. Left message on the voicemail to call again.

## 2019-11-11 NOTE — Telephone Encounter (Signed)
Pt returned your call.  

## 2019-11-11 NOTE — Telephone Encounter (Signed)
Pt last saw Amy Trellis Paganini

## 2019-11-12 ENCOUNTER — Other Ambulatory Visit: Payer: Self-pay

## 2019-11-12 DIAGNOSIS — F332 Major depressive disorder, recurrent severe without psychotic features: Secondary | ICD-10-CM | POA: Diagnosis not present

## 2019-11-12 MED ORDER — AMBULATORY NON FORMULARY MEDICATION: 2 refills | Status: DC

## 2019-11-12 NOTE — Telephone Encounter (Signed)
Yes, please get him an appt at [place] with Dr Leighton Ruff preferably

## 2019-11-12 NOTE — Telephone Encounter (Signed)
Records and request for an appointment with Dr Marcello Moores or [place] faxed to [facility]. Rx for diltiazem/lidocaine compound faxed to [facility].  Called to the patient. No answer. Left a detailed message with this information.

## 2019-11-14 ENCOUNTER — Other Ambulatory Visit: Payer: Self-pay | Admitting: Physician Assistant

## 2019-12-06 ENCOUNTER — Telehealth: Payer: Self-pay | Admitting: Nurse Practitioner

## 2019-12-06 NOTE — Telephone Encounter (Signed)
I received a message from the answering service, the patient called reporting he was in a lot of pain, cannot get up, bleeding real bad.  I called the patient and there was no answer at the provided phone [phone].  I left a message on his personal voicemail for him to go to the local emergency room now, if he was by himself to call 911 for ambulance transport to the closest emergency room.  Also called the patient's father who is listed as his contact and left a message on his father's personal voicemail for the patient to present to the local emergency room for immediate evaluation.

## 2019-12-06 NOTE — Telephone Encounter (Signed)
The patient returned my call. He was soaking in the bathtub for the past hour for he was unable to answer his telephone.  He stated having anal pain that was severe which is somewhat triggered by his back stimulator with a moderate amount of bright red blood in the toilet bowl this morning.  He soaked in the bathtub for 1 hour and his anal fissure pain has significantly reduced and the bleeding has stopped.  He has an appointment to see a colorectal surgeon on Monday and you are a fourth 2021.  I advised him to use Desitin with lidocaine 3 times daily, apply small amount inside the anal area.  MiraLAX, stool softener.  Soak in the bathtub for 15 to 20 minutes 3-4 times daily.  EMS was contacted and the well visit was canceled.  She will call the answering service over the weekend if his symptoms recur/worsen.

## 2019-12-06 NOTE — Telephone Encounter (Signed)
See earlier phone call to patient.  Patient did not return my call.  I called the answering service and they did not receive a second call back from the patient.  I called the patient's father who is his contact and there was no answer.  Therefore, I called [place] [facility] services to request a well visit check.  I requested a call back once the EMS/police have presented to his home.  Patient's address was provided.

## 2019-12-09 ENCOUNTER — Ambulatory Visit: Payer: Self-pay | Admitting: General Surgery

## 2019-12-09 DIAGNOSIS — K602 Anal fissure, unspecified: Secondary | ICD-10-CM | POA: Diagnosis not present

## 2019-12-09 NOTE — H&P (Signed)
History of Present Illness Jesus Ruff MD; [id number] 3:02 PM) The patient is a 54 year old male who presents with an anal fissure. 54 year old male who presents to the office with complaints of an anal fissure that has been occurring off and on for the past 9-12 months. Patient has homocystinuria and feels that the medications used to treat this. Several calls constipation. He has been using Citrucel tablets, but has had no relief in his constipation. He continues to have bleeding on a regular basis. He is having intermittent episodes of pain.   Past Surgical History Jesus Lorenzo, LPN; 624THL [id number] PM) Cataract Surgery Bilateral. Colon Polyp Removal - Colonoscopy Hip Surgery Left. Sleeve Gastrectomy Ventral / Umbilical Hernia Surgery Bilateral.  Diagnostic Studies History Jesus Lorenzo, LPN; 624THL D34-534 PM) Colonoscopy within last year  Allergies (Jesus Crosby, CMA; 12/09/2019 2:48 PM) Amoxicillin *PENICILLINS* Pineapple Extract *CHEMICALS*  Medication History (Jesus Crosby, CMA; 12/09/2019 2:53 PM) Cystadane (8 scoops 3 times a day Oral) Active. Vitamin B-6 (100MG  Tablet, Oral) Active. Vitamin B12 (1000MCG Tablet ER, Oral) Active. Folic Acid (Q000111Q Tablet, Oral) Active. Aspirin (81MG  Tablet Chewable, Oral) Active. Metoprolol Tartrate (75MG  Tablet, Oral) Active. amLODIPine Besylate (5MG  Tablet, Oral) Active. Atorvastatin Calcium (10MG  Tablet, Oral) Active. ALPRAZolam (0.5MG  Tablet, Oral) Active. Furosemide (20MG  Tablet, Oral) Active. Sertraline HCl (100MG  Tablet, Oral) Active. Gabapentin (600MG  Tablet, Oral) Active. Losartan Potassium (50MG  Tablet, Oral) Active. Belsomra (20MG  Tablet, Oral) Active. Alpha Lipoic Acid (200MG  Capsule, Oral) Active. Dicyclomine HCl (10MG  Capsule, Oral) Active. Citrucel (500MG  Tablet, Oral) Active. Medications Reconciled  Social History Jesus Lorenzo, LPN; 624THL D34-534 PM) Alcohol use Recently quit  alcohol use. Caffeine use Carbonated beverages. No drug use Tobacco use Never smoker.  Family History Jesus Lorenzo, LPN; 624THL D34-534 PM) Arthritis Father, Mother. Breast Cancer Mother. Colon Polyps Father, Mother. Hypertension Father, Mother.  Other Problems Jesus Lorenzo, LPN; 624THL D34-534 PM) Anxiety Disorder Arthritis Back Pain Cerebrovascular Accident Depression Diverticulosis Gastroesophageal Reflux Disease High blood pressure Hypercholesterolemia Sleep Apnea Umbilical Hernia Repair     Review of Systems Jesus Lorenzo LPN; 624THL D34-534 PM) General Present- Night Sweats and Weight Gain. Not Present- Appetite Loss, Chills, Fatigue, Fever and Weight Loss. Skin Not Present- Change in Wart/Mole, Dryness, Hives, Jaundice, New Lesions, Non-Healing Wounds, Rash and Ulcer. HEENT Present- Visual Disturbances. Not Present- Earache, Hearing Loss, Hoarseness, Nose Bleed, Oral Ulcers, Ringing in the Ears, Seasonal Allergies, Sinus Pain, Sore Throat, Wears glasses/contact lenses and Yellow Eyes. Respiratory Present- Snoring. Not Present- Bloody sputum, Chronic Cough, Difficulty Breathing and Wheezing. Breast Not Present- Breast Mass, Breast Pain, Nipple Discharge and Skin Changes. Cardiovascular Present- Swelling of Extremities. Not Present- Chest Pain, Difficulty Breathing Lying Down, Leg Cramps, Palpitations, Rapid Heart Rate and Shortness of Breath. Gastrointestinal Present- Abdominal Pain, Bloating, Bloody Stool, Chronic diarrhea, Constipation and Rectal Pain. Not Present- Change in Bowel Habits, Difficulty Swallowing, Excessive gas, Gets full quickly at meals, Hemorrhoids, Indigestion, Nausea and Vomiting. Male Genitourinary Not Present- Blood in Urine, Change in Urinary Stream, Frequency, Impotence, Nocturia, Painful Urination, Urgency and Urine Leakage. Musculoskeletal Present- Back Pain, Joint Pain, Muscle Pain, Muscle Weakness and Swelling of Extremities.  Not Present- Joint Stiffness. Neurological Present- Numbness, Tingling, Trouble walking and Weakness. Not Present- Decreased Memory, Fainting, Headaches, Seizures and Tremor. Psychiatric Present- Anxiety, Change in Sleep Pattern and Depression. Not Present- Bipolar, Fearful and Frequent crying. Endocrine Not Present- Cold Intolerance, Excessive Hunger, Hair Changes, Heat Intolerance, Hot flashes and New Diabetes. Hematology Not Present- Blood Thinners, Easy Bruising, Excessive bleeding, Gland  problems, HIV and Persistent Infections.  Vitals (Jesus Crosby CMA; 12/09/2019 2:53 PM) 12/09/2019 2:53 PM Weight: 330.5 lb Height: 75in Body Surface Area: 2.72 m Body Mass Index: 41.31 kg/m  Temp.: 98.51F  Pulse: 84 (Regular)         Physical Exam Jesus Ruff MD; [id number] 3:03 PM)  General Mental Status-Alert. General Appearance-Cooperative.  Abdomen Palpation/Percussion Palpation and Percussion of the abdomen reveal - Soft and Non Tender.  Rectal Note: large inflammatory tag, unable to visualize fissure at this time. difficult exam due to body habitus    Assessment & Plan Jesus Ruff MD; [id number] 3:06 PM)  ANAL FISSURE (K60.2) Story: stop citrucel and start Miralax twice a day Impression: 54 year old male with what appears to be chronic anal fissure. His exam is relatively difficult due to his body habitus. I have recommended that we perform an exam under anesthesia. If a chronic-appearing fissure is noted, we will plan on performing a chemical sphincterotomy. We will also remove his inflammatory skin tag to help with his pain and bleeding with bowel movements. We discussed that he will need to get his stool softer to allow for the area to heal. I have recommended that he stop the Citrucel tablets and start MiraLAX twice a day instead.

## 2019-12-27 ENCOUNTER — Telehealth: Payer: Self-pay

## 2019-12-27 NOTE — Telephone Encounter (Signed)
Patient called for an appointment with Dr Holley Raring to see if there was anything further he could do for his pain.  Appointment scheduled for 01-08-2020 at 1400.  Patient notified.

## 2020-01-08 ENCOUNTER — Ambulatory Visit
Payer: Medicare HMO | Attending: Student in an Organized Health Care Education/Training Program | Admitting: Student in an Organized Health Care Education/Training Program

## 2020-01-08 ENCOUNTER — Other Ambulatory Visit: Payer: Self-pay

## 2020-01-08 ENCOUNTER — Encounter: Payer: Self-pay | Admitting: Student in an Organized Health Care Education/Training Program

## 2020-01-08 DIAGNOSIS — Z9689 Presence of other specified functional implants: Secondary | ICD-10-CM

## 2020-01-08 DIAGNOSIS — G629 Polyneuropathy, unspecified: Secondary | ICD-10-CM

## 2020-01-08 DIAGNOSIS — M48061 Spinal stenosis, lumbar region without neurogenic claudication: Secondary | ICD-10-CM | POA: Diagnosis not present

## 2020-01-08 DIAGNOSIS — G5793 Unspecified mononeuropathy of bilateral lower limbs: Secondary | ICD-10-CM | POA: Diagnosis not present

## 2020-01-08 MED ORDER — ALPHA-LIPOIC ACID 600 MG PO CAPS: 600.0000 mg | ORAL_CAPSULE | Freq: Two times a day (BID) | ORAL | 2 refills | Status: AC

## 2020-01-08 NOTE — Progress Notes (Signed)
Patient: Jesus Crosby.  Service Category: E/M  Provider: Gillis Santa, MD  DOB: May 01, 1966  DOS: 01/08/2020  Location: Office  MRN: [id number]  Setting: Ambulatory outpatient  Referring Provider: Ronnell Freshwater, NP  Type: Established Patient  Specialty: Interventional Pain Management  PCP: Ronnell Freshwater, NP  Location: Home  Delivery: TeleHealth     Virtual Encounter - Pain Management PROVIDER NOTE: Information contained herein reflects review and annotations entered in association with encounter. Interpretation of such information and data should be left to medically-trained personnel. Information provided to patient can be located elsewhere in the medical record under "Patient Instructions". Document created using STT-dictation technology, any transcriptional errors that may result from process are unintentional.    Contact & Pharmacy Preferred: [phone] Home: [phone] (home) Mobile: [phone] (mobile) E-mail: [email or web address]  [facility] [place], [place] [address] Phone: [phone] Fax: [place], [place] [address] Phone: [phone] Fax: [place], [place]. [place] [address] Phone: [phone] Fax: [phone]   Pre-screening  Mr. Lumb offered "in-person" vs "virtual" encounter. He indicated preferring virtual for this encounter.   Reason COVID-19*  Social distancing based on CDC and AMA recommendations.   I contacted Devoria Glassing. on 01/08/2020 via telephone.      I clearly identified myself as Gillis Santa, MD. I verified that I was speaking with the correct person using two identifiers (Name: Erland Nagar., and date of birth: [birth year]).  This visit was completed via telephone due  to the restrictions of the COVID-19 pandemic. All issues as above were discussed and addressed but no physical exam was performed. If it was felt that the patient should be evaluated in the office, they were directed there. The patient verbally consented to this visit. Patient was unable to complete an audio/visual visit due to Technical difficulties and/or Lack of internet. Due to the catastrophic nature of the COVID-19 pandemic, this visit was done through audio contact only.  Location of the patient: home address (see Epic for details)  Location of the provider: office Consent I sought verbal advanced consent from Devoria Glassing. for virtual visit interactions. I informed Mr. Coster of possible security and privacy concerns, risks, and limitations associated with providing "not-in-person" medical evaluation and management services. I also informed Mr. Yoshino of the availability of "in-person" appointments. Finally, I informed him that there would be a charge for the virtual visit and that he could be  personally, fully or partially, financially responsible for it. Mr. Manecke expressed understanding and agreed to proceed.   Historic Elements   Mr. Ami Kumagai. is a 54 y.o. year old, male patient evaluated today after his last encounter by our practice on 12/27/2019. Mr. 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 He also  has a past surgical history that includes Intraocular lens insertion (Bilateral); Wrist surgery (Right); Dermabrasion of face; Eye surgery; Pars plana vitrectomy  (Right,  09/17/2013); Photocoagulation (Right, 09/17/2013); Gas/fluid exchange (Right, 09/17/2013); Nasal septum surgery (march 2016); Umbilical hernia repair (N/A, 05/07/2015); Colonoscopy with propofol (N/A, 07/30/2015); Esophagogastroduodenoscopy (egd) with propofol (N/A, 07/30/2015); scapholunate ligament reconstruction of right hand (2011); distal radius fracture of right hand (plates, screws, and pins) (2011); gastric sleeve surgery (08/17/2016); Hip Arthroplasty (Left); Joint replacement (Left); and Pulse generator implant (N/A, 02/13/2019). Mr. Manis has a current medication list which includes the following prescription(s): alpha-lipoic acid, alprazolam, AMBULATORY NON FORMULARY MEDICATION, amlodipine, aspirin ec, atorvastatin, cystadane, clotrimazole, cyclosporine, dicyclomine, folic acid, furosemide, gabapentin, losartan, methocarbamol, citrucel, metoprolol tartrate, NONFORMULARY OR COMPOUNDED ITEM, polyethylene glycol powder, pyridoxine, sertraline, suvorexant, and vitamin b-12. He  reports that he has never smoked. He has never used smokeless tobacco. He reports that he does not drink alcohol or use drugs. Mr. Baggarly is allergic to pineapple; amoxicillin; and keflex [cephalexin].   HPI  Today, he is being contacted for follow-up evaluation  Here to discuss any additional medication therapies to help his lower extremity paresthesias.  Patient states that he is having more numbness and tingling in both of his feet.  He does have a spinal cord stimulator in place.  He states that spinal cord stimulation is effective however he does have to turn it off fairly high to get benefit which makes it difficult for him to walk.  In review of medication trials, patient is on high-dose gabapentin 600 mg twice daily along with alpha lipoic acid 600 mg daily.  He has tried Lyrica in the past which resulted in side effects and he has since stopped.  We discussed alternatives which could include Cymbalta.  Patient is on  high-dose Zoloft so I defer to psychiatry however there is concern with potential serotonin syndrome with the addition of Cymbalta in such a high dose of Zoloft.  Patient has also tried lidocaine infusions in the past which were not very effective.  I recommend that he decrease his alpha lipoic acid to 600 mg twice daily as doses excessive that have resulted in adverse effect such as nausea, vertigo, dizziness without much added benefit.  Patient has reached out to his spinal cord stimulator representative to try and optimize settings.  Laboratory Chemistry Profile (12 mo)  Renal: 02/01/2019: BUN 10; Creatinine, Ser 0.74  Lab Results  Component Value Date   GFR 73.96 09/21/2018   GFRAA >60 02/01/2019   GFRNONAA >60 02/01/2019   Hepatic: No results found for requested labs within last 8760 hours. Lab Results  Component Value Date   AST 57 (H) 10/04/2018   ALT 56 (H) 10/04/2018   Other: No results found for requested labs within last 8760 hours.  Note: Above Lab results reviewed.  Imaging  DG Thoracic Spine 1 View CLINICAL DATA:  Spinal stimulator placement  EXAM: THORACIC SPINE - 1 VIEW; DG C-ARM 61-120 MIN  COMPARISON:  None.  FLUOROSCOPY TIME:  Radiation Exposure Index (as provided by the fluoroscopic device): 6.2 mGy  If the device does not provide the exposure index:  Fluoroscopy Time:  8 seconds  Number of Acquired Images:  4  FINDINGS: Images demonstrate surgical instruments over the lower thoracic spine. Subsequent spinal stimulator is noted. Correlation with the operative findings is recommended.  IMPRESSION: Spinal stimulator placement in the thoracic spine.  Electronically Signed   By: Inez Catalina M.D.   On: 02/13/2019 13:32 DG C-Arm 1-60 Min CLINICAL DATA:  Spinal stimulator placement  EXAM: THORACIC SPINE - 1 VIEW; DG C-ARM 61-120 MIN  COMPARISON:  None.  FLUOROSCOPY TIME:  Radiation Exposure Index (as provided by the fluoroscopic device): 6.2  mGy  If the device does not provide the exposure index:  Fluoroscopy Time:  8 seconds  Number of Acquired Images:  4  FINDINGS: Images demonstrate surgical instruments over the lower thoracic spine. Subsequent spinal stimulator is noted. Correlation with the operative findings is recommended.  IMPRESSION: Spinal stimulator placement in the thoracic spine.  Electronically Signed   By: Inez Catalina M.D.   On: 02/13/2019 13:32   Assessment  The primary encounter diagnosis was Neuropathic pain of both legs. 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  Plan of Care   I have discontinued Anquan Gorelick. [name]. "Jimmy"'s Alpha-Lipoic Acid. I am also having him start on Alpha-Lipoic Acid. Additionally, I am having him maintain his ALPRAZolam, pyridOXINE, folic acid, sertraline, Cystadane, aspirin EC, vitamin B-12, Suvorexant, methocarbamol, NONFORMULARY OR COMPOUNDED ITEM, clotrimazole, cycloSPORINE, gabapentin, polyethylene glycol powder, Citrucel, amLODipine, metoprolol tartrate, furosemide, losartan, atorvastatin, AMBULATORY NON FORMULARY MEDICATION, and dicyclomine.  Pharmacotherapy (Medications Ordered): Meds ordered this encounter  Medications  . Alpha-Lipoic Acid 600 MG CAPS    Sig: Take 1 capsule (600 mg total) by mouth 2 (two) times daily.    Dispense:  180 capsule    Refill:  2    Do not place medication on "Automatic Refill". Fill one day early if pharmacy is closed on scheduled refill date.   1. Continue with spinal cord stimulation 2.  Discussed addition of Cymbalta to current medication regimen with psychiatrist since you are on Zoloft. 3.  Decreased alpha lipoic acid to 600 mg twice daily  Follow-up plan:   Return if symptoms worsen or fail to improve.    Recent Visits No visits were found meeting these  conditions.  Showing recent visits within past 90 days and meeting all other requirements   Today's Visits Date Type Provider Dept  01/08/20 Office Visit Gillis Santa, MD [facility]  Showing today's visits and meeting all other requirements   Future Appointments No visits were found meeting these conditions.  Showing future appointments within next 90 days and meeting all other requirements   I discussed the assessment and treatment plan with the patient. The patient was provided an opportunity to ask questions and all were answered. The patient agreed with the plan and demonstrated an understanding of the instructions.  Patient advised to call back or seek an in-person evaluation if the symptoms or condition worsens.  Duration of encounter: 15 minutes.  Note by: Gillis Santa, MD Date: 01/08/2020; Time: 2:16 PM

## 2020-01-09 ENCOUNTER — Telehealth: Payer: Self-pay | Admitting: *Deleted

## 2020-01-09 NOTE — Telephone Encounter (Signed)
Called to patient to let him know that PA has been sent in for alpha lipoic acid and because it is an OTC medication they will not cover that.  PA denied.  Voicemail with this information left.

## 2020-01-15 ENCOUNTER — Telehealth: Payer: Self-pay | Admitting: *Deleted

## 2020-01-15 DIAGNOSIS — G629 Polyneuropathy, unspecified: Secondary | ICD-10-CM

## 2020-01-15 DIAGNOSIS — E7211 Homocystinuria: Secondary | ICD-10-CM | POA: Diagnosis not present

## 2020-01-15 NOTE — Telephone Encounter (Signed)
Will you order this?

## 2020-01-15 NOTE — Telephone Encounter (Signed)
Do yall need to know this?

## 2020-01-16 ENCOUNTER — Other Ambulatory Visit: Payer: Self-pay

## 2020-01-16 MED ORDER — CYCLOSPORINE 0.05 % OP EMUL: 2.0000 [drp] | Freq: Two times a day (BID) | OPHTHALMIC | 0 refills | Status: DC

## 2020-01-18 ENCOUNTER — Ambulatory Visit: Payer: Medicare HMO

## 2020-01-19 ENCOUNTER — Ambulatory Visit: Payer: Medicare HMO

## 2020-01-25 IMAGING — CT CT ABD-PELV W/ CM
2 of 5 series · 16 of 46 positions shown, 18 images · IV contrast (ISOVUE 300)
Comparison: None.

CLINICAL DATA: Worsening diffuse abdominal pain, nausea, and
diarrhea. Previous sleeve gastrectomy.

EXAM:
CT ABDOMEN AND PELVIS WITH CONTRAST
TECHNIQUE: Multidetector CT imaging of the abdomen and pelvis was performed
using the standard protocol following bolus administration of
intravenous contrast.
CONTRAST:  100mL KMHZ4N-2SS IOPAMIDOL (KMHZ4N-2SS) INJECTION 61%

[Series 2: abd/pel w · axial · 0.88mm/px · z∈[-781,-271]mm · 13 of 115 slices shown, 15 images]
[im 7/115  soft-tissue]
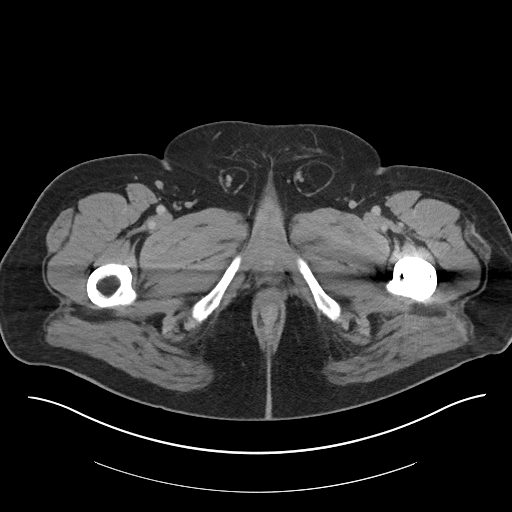
[im 7/115  bone]
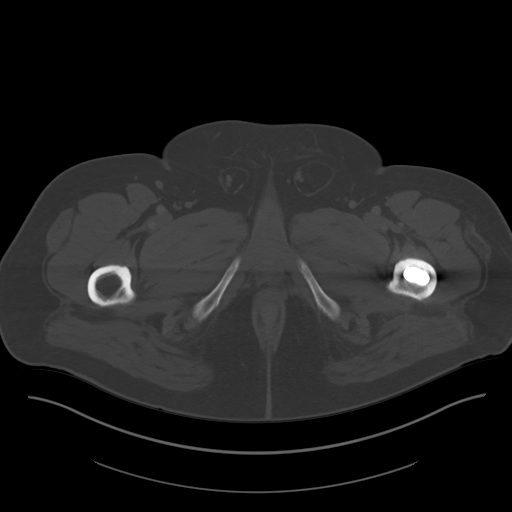
[im 19/115  soft-tissue]
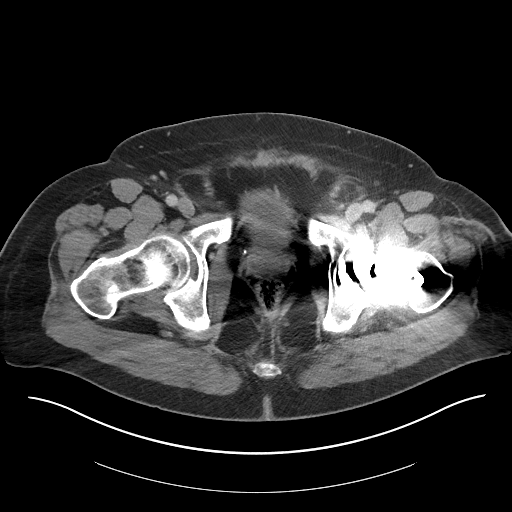
[im 25/115  soft-tissue]
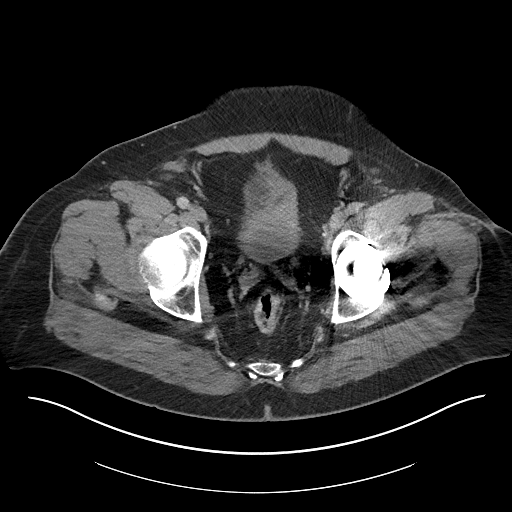
[im 31/115  soft-tissue]
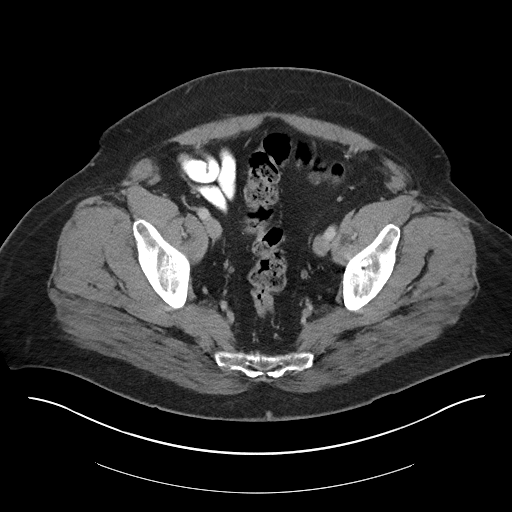
[im 43/115  soft-tissue]
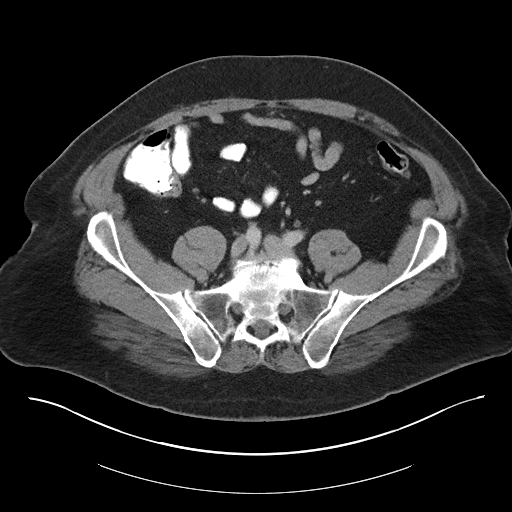
[im 49/115  soft-tissue]
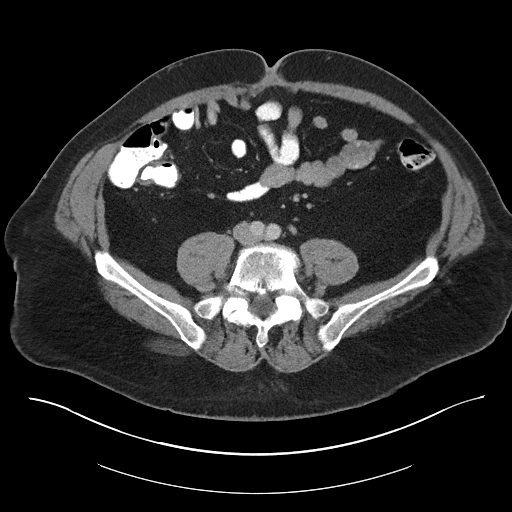
[im 61/115  soft-tissue]
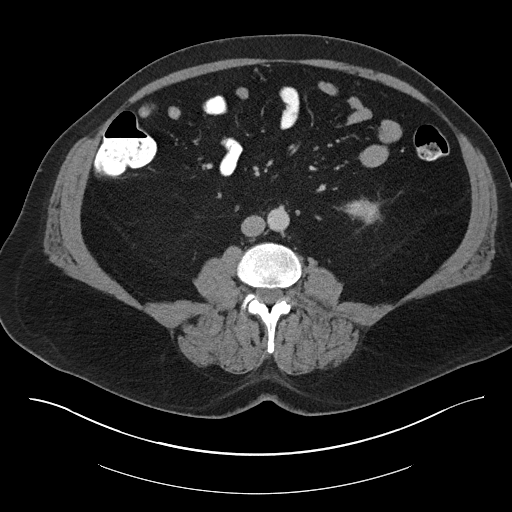
[im 67/115  soft-tissue]
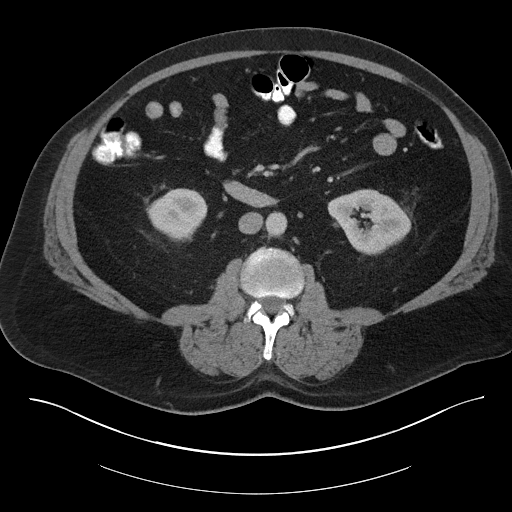
[im 73/115  soft-tissue]
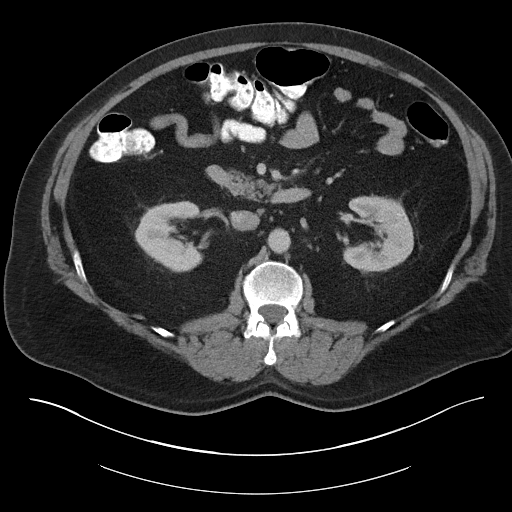
[im 73/115  bone]
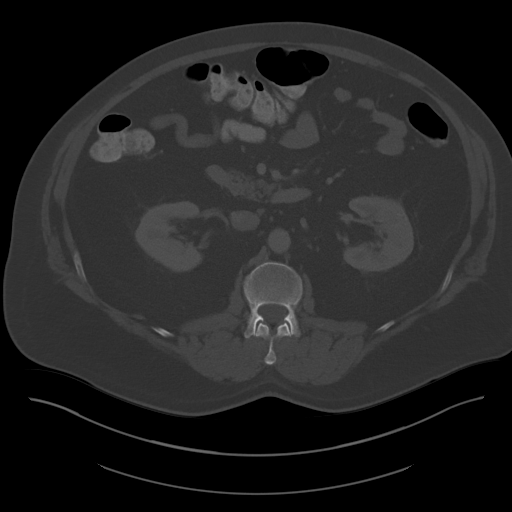
[im 85/115  soft-tissue]
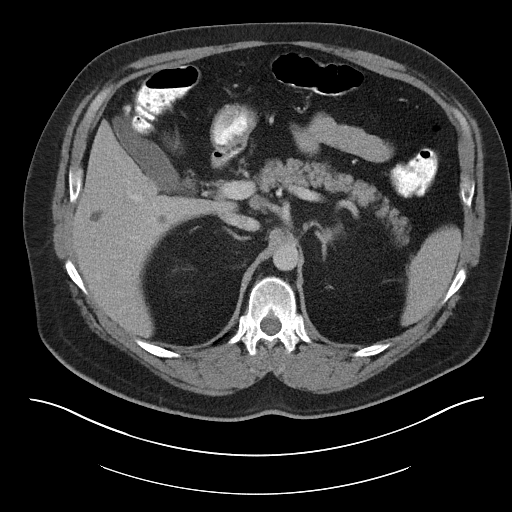
[im 91/115  soft-tissue]
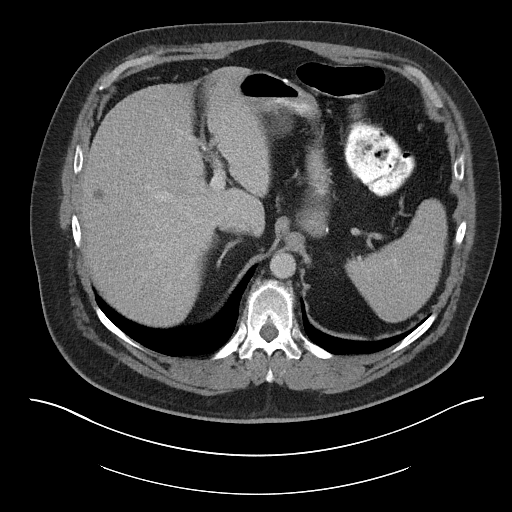
[im 97/115  soft-tissue]
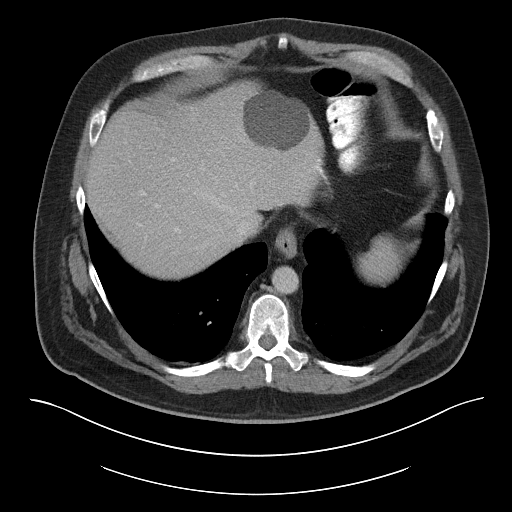
[im 109/115  soft-tissue]
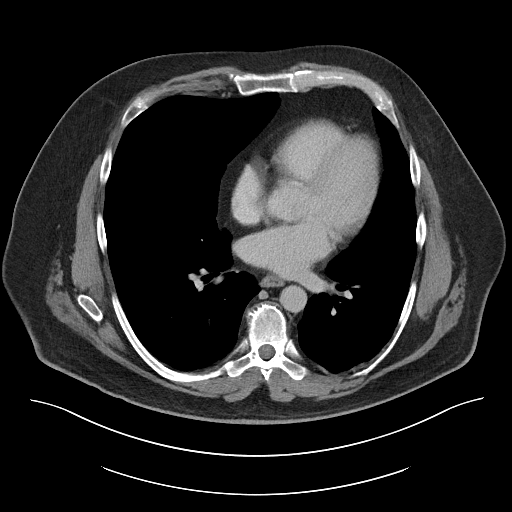

[Series 6: abd/pel w st · coronal · 0.91mm/px · 3 of 106 slices shown]
[im 36/106  soft-tissue]
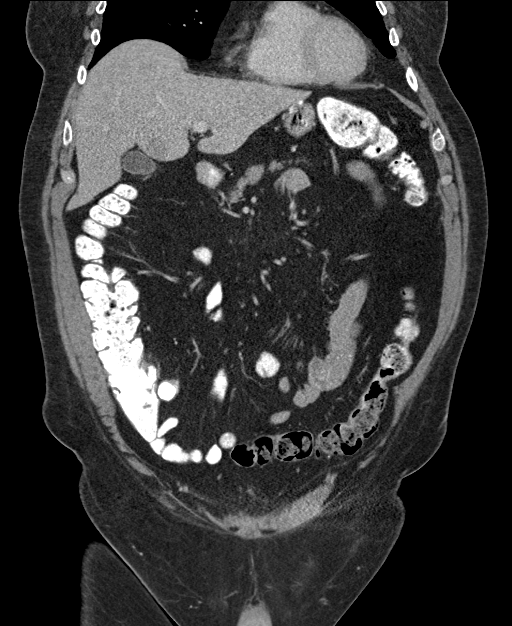
[im 47/106  soft-tissue]
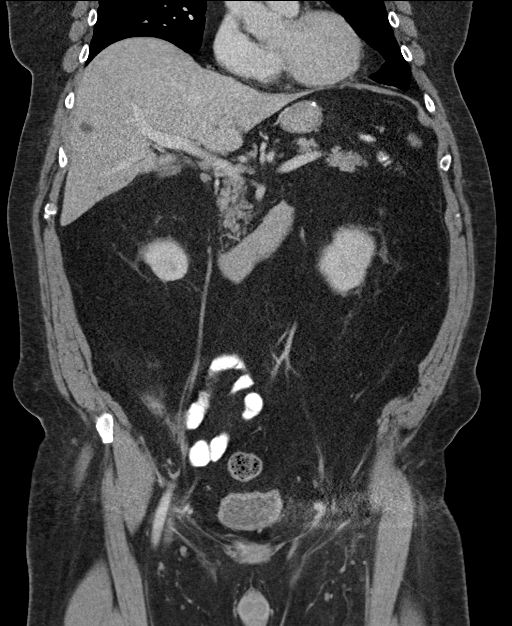
[im 59/106  soft-tissue]
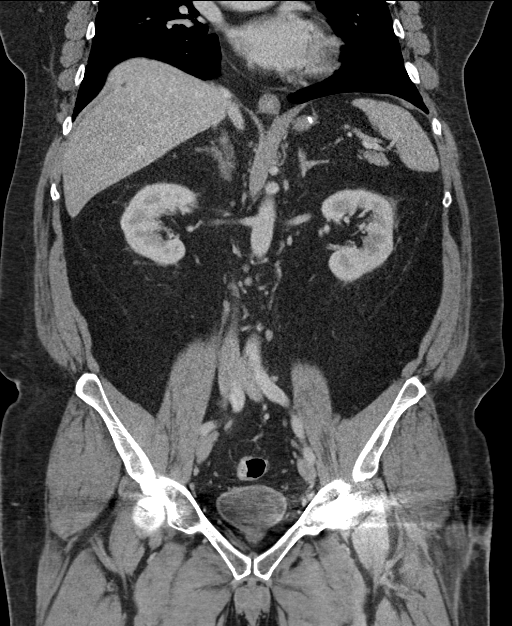

[16 of 46 positions shown; findings below may reference images not displayed]

FINDINGS: Lower Chest: No acute findings.

Hepatobiliary: Several cysts are seen in right and left lobes, with
largest in the left lobe measuring 6.2 cm. Others measure less than
1 cm in size. No hepatic masses identified. Gallbladder is
unremarkable. No evidence of biliary ductal dilatation.

Pancreas:  No mass or inflammatory changes.

Spleen: Within normal limits in size and appearance.

Adrenals/Urinary Tract: No masses identified. Tiny sub-cm cyst is
seen in anterior interpolar region of left kidney. No evidence of
ureteral calculi or hydronephrosis. Unremarkable unopacified urinary
bladder.

Stomach/Bowel: Normal postop changes from previous sleeve
gastrectomy. No evidence of obstruction, inflammatory process or
abnormal fluid collections.

Vascular/Lymphatic: No pathologically enlarged lymph nodes. No
abdominal aortic aneurysm.

Reproductive:  No mass or other significant abnormality.

Other:  None.

Musculoskeletal:  No suspicious bone lesions identified.
IMPRESSION: Previous sleeve gastrectomy. No acute findings or other significant
abnormality.

## 2020-01-30 ENCOUNTER — Other Ambulatory Visit: Payer: Self-pay | Admitting: Physician Assistant

## 2020-01-31 DIAGNOSIS — G4733 Obstructive sleep apnea (adult) (pediatric): Secondary | ICD-10-CM | POA: Diagnosis not present

## 2020-02-10 DIAGNOSIS — E7211 Homocystinuria: Secondary | ICD-10-CM | POA: Diagnosis not present

## 2020-02-11 DIAGNOSIS — F332 Major depressive disorder, recurrent severe without psychotic features: Secondary | ICD-10-CM | POA: Diagnosis not present

## 2020-02-11 DIAGNOSIS — F429 Obsessive-compulsive disorder, unspecified: Secondary | ICD-10-CM | POA: Diagnosis not present

## 2020-03-03 ENCOUNTER — Telehealth: Payer: Self-pay | Admitting: Nurse Practitioner

## 2020-03-03 DIAGNOSIS — K602 Anal fissure, unspecified: Secondary | ICD-10-CM

## 2020-03-03 DIAGNOSIS — R109 Unspecified abdominal pain: Secondary | ICD-10-CM

## 2020-03-03 DIAGNOSIS — K625 Hemorrhage of anus and rectum: Secondary | ICD-10-CM

## 2020-03-03 DIAGNOSIS — K5900 Constipation, unspecified: Secondary | ICD-10-CM

## 2020-03-03 NOTE — Telephone Encounter (Signed)
Patient called again needs advise

## 2020-03-04 MED ORDER — DICYCLOMINE HCL 10 MG PO CAPS: 10.0000 mg | ORAL_CAPSULE | Freq: Three times a day (TID) | ORAL | 0 refills | Status: DC

## 2020-03-04 NOTE — Telephone Encounter (Signed)
Called and spoke with patient-patient informed of provider's recommendations; patient is agreeable with plan of care and verified pharmacy; RX sent; patient verbalized understanding of information/instructions;  Patient was advised to call the office at [phone] if questions/concerns arise;

## 2020-03-04 NOTE — Telephone Encounter (Signed)
If MiraLAX twice daily is causing diarrhea later in the day, he should back off to MiraLAX once daily in the morning. Yes it is okay to increase Bentyl 10 mg to 4 times daily, as a trial, can send new Rx.

## 2020-03-04 NOTE — Telephone Encounter (Signed)
Called and spoke with patient-patient reports he is taking the Bentyl BID dosing but he is still having abd cramping, eases off after having a bowel movement or two; Miralax BID dosing is causing liquid stool later in the day -RN advised patient to take 1/2 capful QID instead of 1 capful morning and night to see if bowel movements become less "drastic" in change;  Patient is requesting to know if the Bentyl can be increased to TID or QID dosing?  please advise

## 2020-03-18 ENCOUNTER — Telehealth: Payer: Self-pay

## 2020-03-18 ENCOUNTER — Telehealth: Payer: Self-pay | Admitting: *Deleted

## 2020-03-18 NOTE — Telephone Encounter (Signed)
Bre, I have not seen the patient in office, he was last seen by Amy. He needs an office appt for further evaluation. You can schedule appt with Amy or me. thx

## 2020-03-18 NOTE — Telephone Encounter (Signed)
Called and spoke with patient-patient reports the Bentyl is not really working-patient reports when he first gets up he has a "solid stool then I have diarrhea 3-4 times and then by the afternoon I get to feeling a little better but the abdominal cramping is just not stopping-it just hurts"; patient wanted to remind provider that he has had the gastric sleeve sx- Please advise of next step in plan of care

## 2020-03-18 NOTE — Telephone Encounter (Signed)
Patient is calling states the medication is not really helping and he is seeking another alternative possibly a procedure

## 2020-03-18 NOTE — Telephone Encounter (Signed)
He is having neuropathy in his fingers and wants to know if there is something he can do for that.

## 2020-03-18 NOTE — Telephone Encounter (Signed)
Spoke with patient about s/s of peripheral neuralgia.  States he walks with braces on his legs for stability.  States feet are bad but he feels that they are getting worse.  Also, fingers have become numb and tingly which has just started.  He is wondering if anything else can be done.  Patient mentioned doing a nerve conduction test and wasn't sure if it was time for that again.

## 2020-03-19 ENCOUNTER — Encounter: Payer: Self-pay | Admitting: Student in an Organized Health Care Education/Training Program

## 2020-03-19 NOTE — Telephone Encounter (Signed)
Left message for patient to call back to the office;  

## 2020-03-19 NOTE — Telephone Encounter (Signed)
LM for patient to call office

## 2020-03-20 NOTE — Telephone Encounter (Signed)
Patient returned call to the office-spoke with [facility]- patient has been scheduled for an appt on 04/01/2020 at 9:30 with Jaclyn Shaggy, NP;

## 2020-03-23 ENCOUNTER — Other Ambulatory Visit: Payer: Self-pay

## 2020-03-23 ENCOUNTER — Ambulatory Visit
Payer: Medicare HMO | Attending: Student in an Organized Health Care Education/Training Program | Admitting: Student in an Organized Health Care Education/Training Program

## 2020-03-23 DIAGNOSIS — M792 Neuralgia and neuritis, unspecified: Secondary | ICD-10-CM | POA: Diagnosis not present

## 2020-03-23 DIAGNOSIS — Z9689 Presence of other specified functional implants: Secondary | ICD-10-CM | POA: Diagnosis not present

## 2020-03-23 DIAGNOSIS — G629 Polyneuropathy, unspecified: Secondary | ICD-10-CM | POA: Diagnosis not present

## 2020-03-23 DIAGNOSIS — G5793 Unspecified mononeuropathy of bilateral lower limbs: Secondary | ICD-10-CM | POA: Diagnosis not present

## 2020-03-23 NOTE — Progress Notes (Signed)
Patient: Jesus Crosby.  Service Category: E/M  Provider: Gillis Santa, MD  DOB: [birth year] Crosby  DOS: 03/23/2020  Location: Office  MRN: [id number]  Setting: Ambulatory outpatient  Referring Provider: Ronnell Freshwater, NP  Type: Established Patient  Specialty: Interventional Pain Management  PCP: Jesus Freshwater, NP  Location: Home  Delivery: TeleHealth     Virtual Encounter - Pain Management PROVIDER NOTE: Information contained herein reflects review and annotations entered in association with encounter. Interpretation of such information and data should be left to medically-trained personnel. Information provided to patient can be located elsewhere in the medical record under "Patient Instructions". Document created using STT-dictation technology, any transcriptional errors that may result from process are unintentional.    Contact & Pharmacy Preferred: [phone] Home: [phone] (home) Mobile: [phone] (mobile) E-mail: [email or web address]  [facility] [place], [place] [address] Phone: [phone] Fax: [place], [place] [address] Phone: [phone] Fax: [place], [place] [place] [address] Phone: [phone] Fax: [phone]   Pre-screening  Jesus Crosby offered "in-person" vs "virtual" encounter. He indicated preferring virtual for this encounter.   Reason COVID-19*  Social distancing based on CDC and AMA recommendations.   I contacted Jesus Crosby. on 03/23/2020 via telephone.      I clearly identified myself as Jesus Santa, MD. I verified that I was speaking with the correct person using two identifiers (Name: Jesus Crosby., and date of birth: Jesus Crosby).  This visit was completed via telephone  due to the restrictions of the COVID-19 pandemic. All issues as above were discussed and addressed but no physical exam was performed. If it was felt that the patient should be evaluated in the office, they were directed there. The patient verbally consented to this visit. Patient was unable to complete an audio/visual visit due to Technical difficulties and/or Lack of internet. Due to the catastrophic nature of the COVID-19 pandemic, this visit was done through audio contact only.  Location of the patient: home address (see Epic for details)  Location of the provider: office   Consent I sought verbal advanced consent from Jesus Crosby. for virtual visit interactions. I informed Jesus Crosby of possible security and privacy concerns, risks, and limitations associated with providing "not-in-person" medical evaluation and management services. I also informed Jesus Crosby of the availability of "in-person" appointments. Finally, I informed him that there would be a charge for the virtual visit and that he could be  personally, fully or partially, financially responsible for it. Jesus Crosby expressed understanding and agreed to proceed.   Historic Elements   [name] is a 54 y.o. year old, male patient evaluated today after his last contact with our practice on 03/18/2020. 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 He also  has a past surgical history that includes Intraocular lens insertion (Bilateral); Wrist surgery (Right); Dermabrasion of face; Eye surgery; Pars plana  vitrectomy (Right, 09/17/2013); Photocoagulation (Right, 09/17/2013); Gas/fluid exchange (Right, 09/17/2013); Nasal septum surgery (march 2016); Umbilical hernia repair (N/A, 05/07/2015); Colonoscopy with propofol (N/A, 07/30/2015); Esophagogastroduodenoscopy (egd) with propofol (N/A, 07/30/2015); scapholunate ligament reconstruction of right hand (2011); distal radius fracture of right hand (plates, screws, and pins) (2011); gastric sleeve surgery (08/17/2016); Hip Arthroplasty (Left); Joint replacement (Left); and Pulse generator implant (N/A, 02/13/2019). Jesus Crosby has a current medication list which includes the following prescription(s): alpha-lipoic acid, alprazolam, AMBULATORY NON FORMULARY MEDICATION, amlodipine, aspirin ec, atorvastatin, cystadane, clotrimazole, cyclosporine, dicyclomine, folic acid, furosemide, gabapentin, losartan, methocarbamol, citrucel, metoprolol tartrate, NONFORMULARY OR COMPOUNDED ITEM, polyethylene glycol powder, pyridoxine, sertraline, suvorexant, and vitamin b-12. He  reports that he has never smoked. He has never used smokeless tobacco. He reports that he does not drink alcohol or use drugs. Jesus Crosby is allergic to pineapple; amoxicillin; and keflex [cephalexin].   HPI  Today, he is being contacted for new problems.   Patient states that he is having more pain in both of his arms.  He describes them as burning or tingling.  He is wondering whether he needs to have another nerve conduction velocity or EMG study.  I recommended that he reach out to neurology regarding this question.  We had a discussion about cervical spinal cord stimulation.  I do not perform cervical stim trial but the patient could be a candidate.  He has benefited from lumbar spinal cord stimulation for his lower extremity neuropathic pain.  I informed the patient that we will reach out to my colleague, Dr. Dossie Arbour to see if he will take on his case for a cervical stim trial.  Otherwise patient will need to  talk to Dr. Lacinda Axon or Dr. Mearl Latin at [facility] for cervical stem trial/implant.  Laboratory Chemistry Profile   Renal Lab Results  Component Value Date   BUN 10 02/01/2019   CREATININE 0.74 02/01/2019   BCR 13 08/29/2018   GFR 73.96 09/21/2018   GFRAA >60 02/01/2019   GFRNONAA >60 02/01/2019     Hepatic Lab Results  Component Value Date   AST 57 (H) 10/04/2018   ALT 56 (H) 10/04/2018   ALBUMIN 4.7 10/04/2018   ALKPHOS 54 10/04/2018   HCVAB NEGATIVE 08/03/2015     Electrolytes Lab Results  Component Value Date   NA 133 (L) 02/01/2019   K 4.0 02/01/2019   CL 97 (L) 02/01/2019   CALCIUM 8.6 (L) 02/01/2019     Bone No results found for: VD25OH, JG283MO2HUT, ML4650PT4, SF6812XN1, 25OHVITD1, 25OHVITD2, 25OHVITD3, TESTOFREE, TESTOSTERONE   Inflammation (CRP: Acute Phase) (ESR: Chronic Phase) Lab Results  Component Value Date   CRP 0.2 (L) 09/21/2018   ESRSEDRATE 52 (H) 01/02/2019       Note: Above Lab results reviewed.  Imaging  DG Thoracic Spine 1 View CLINICAL DATA:  Spinal stimulator placement  EXAM: THORACIC SPINE - 1 VIEW; DG C-ARM 61-120 MIN  COMPARISON:  None.  FLUOROSCOPY TIME:  Radiation Exposure Index (as provided by the fluoroscopic device): 6.2 mGy  If the device does not provide the exposure index:  Fluoroscopy Time:  8 seconds  Number of Acquired Images:  4  FINDINGS: Images demonstrate surgical instruments over the lower thoracic spine. Subsequent spinal stimulator is noted. Correlation with the operative findings is recommended.  IMPRESSION: Spinal stimulator placement in the thoracic spine.  Electronically Signed   By: Inez Catalina M.D.   On: 02/13/2019 13:32 DG C-Arm 1-60 Min CLINICAL DATA:  Spinal stimulator placement  EXAM: THORACIC SPINE -  1 VIEW; DG C-ARM 61-120 MIN  COMPARISON:  None.  FLUOROSCOPY TIME:  Radiation Exposure Index (as provided by the fluoroscopic device): 6.2 mGy  If the device does not provide the exposure  index:  Fluoroscopy Time:  8 seconds  Number of Acquired Images:  4  FINDINGS: Images demonstrate surgical instruments over the lower thoracic spine. Subsequent spinal stimulator is noted. Correlation with the operative findings is recommended.  IMPRESSION: Spinal stimulator placement in the thoracic spine.  Electronically Signed   By: Inez Catalina M.D.   On: 02/13/2019 13:32  Assessment  The primary encounter diagnosis was Neuropathic pain of upper extremity. Diagnoses of Neuropathy, Spinal cord stimulator status, and Neuropathic pain of both legs were also pertinent to this visit.  Plan of Care  Mr. Camilo Crosby. has a current medication list which includes the following long-term medication(s): amlodipine, atorvastatin, dicyclomine, furosemide, losartan, and metoprolol tartrate.  1. Neuropathic pain of upper extremity -Consider cervical stim trial.  Will discuss with my partner Dr. Dossie Arbour to see if he would take on his case as I don't do cervical stim.  Otherwise patient would need referral to Dr. Lacinda Axon or Dr. Mearl Latin. -Discussed with neurology utility of repeating EMG/nerve conduction velocity studies and how frequently ED should be done to monitor his disease progression.  2. Neuropathy -Continue alpha lipoic acid, gabapentin, lumbar SCS  3. Spinal cord stimulator status -Functional  4. Neuropathic pain of both legs -Thoracolumbar spinal cord stimulation in place.  Follow-up plan:   No follow-ups on file.    Recent Visits Date Type Provider Dept  01/08/20 Office Visit Jesus Santa, MD [facility]  Showing recent visits within past 90 days and meeting all other requirements   Today's Visits Date Type Provider Dept  03/23/20 Office Visit Jesus Santa, MD [facility]  Showing today's visits and meeting all other requirements   Future Appointments No visits were found meeting these conditions.  Showing future appointments within next 90 days  and meeting all other requirements   I discussed the assessment and treatment plan with the patient. The patient was provided an opportunity to ask questions and all were answered. The patient agreed with the plan and demonstrated an understanding of the instructions.  Patient advised to call back or seek an in-person evaluation if the symptoms or condition worsens.  Duration of encounter: 15 minutes.  Note by: Jesus Santa, MD Date: 03/23/2020; Time: 3:17 PM

## 2020-03-27 ENCOUNTER — Telehealth: Payer: Self-pay

## 2020-03-27 NOTE — Telephone Encounter (Signed)
Confirmed appointment on 03/31/2020 and screened for covid. klh

## 2020-03-27 NOTE — Telephone Encounter (Signed)
Called lmom informing patient of appointment on 03/31/2020. klh

## 2020-03-31 ENCOUNTER — Other Ambulatory Visit: Payer: Self-pay

## 2020-03-31 ENCOUNTER — Encounter: Payer: Self-pay | Admitting: Adult Health

## 2020-03-31 ENCOUNTER — Ambulatory Visit (INDEPENDENT_AMBULATORY_CARE_PROVIDER_SITE_OTHER): Payer: Medicare HMO | Admitting: Adult Health

## 2020-03-31 VITALS — BP 139/74 | HR 82 | Temp 98.4°F | Resp 16 | Ht 75.0 in | Wt 339.0 lb

## 2020-03-31 DIAGNOSIS — F411 Generalized anxiety disorder: Secondary | ICD-10-CM

## 2020-03-31 DIAGNOSIS — I517 Cardiomegaly: Secondary | ICD-10-CM

## 2020-03-31 DIAGNOSIS — I10 Essential (primary) hypertension: Secondary | ICD-10-CM

## 2020-03-31 DIAGNOSIS — Z125 Encounter for screening for malignant neoplasm of prostate: Secondary | ICD-10-CM

## 2020-03-31 DIAGNOSIS — E7211 Homocystinuria: Secondary | ICD-10-CM

## 2020-03-31 DIAGNOSIS — Z9989 Dependence on other enabling machines and devices: Secondary | ICD-10-CM

## 2020-03-31 DIAGNOSIS — Z0001 Encounter for general adult medical examination with abnormal findings: Secondary | ICD-10-CM | POA: Diagnosis not present

## 2020-03-31 DIAGNOSIS — H04123 Dry eye syndrome of bilateral lacrimal glands: Secondary | ICD-10-CM

## 2020-03-31 DIAGNOSIS — R300 Dysuria: Secondary | ICD-10-CM

## 2020-03-31 DIAGNOSIS — E7849 Other hyperlipidemia: Secondary | ICD-10-CM | POA: Diagnosis not present

## 2020-03-31 DIAGNOSIS — G4733 Obstructive sleep apnea (adult) (pediatric): Secondary | ICD-10-CM

## 2020-03-31 DIAGNOSIS — G629 Polyneuropathy, unspecified: Secondary | ICD-10-CM

## 2020-03-31 MED ORDER — FUROSEMIDE 20 MG PO TABS: 20.0000 mg | ORAL_TABLET | Freq: Every day | ORAL | 1 refills | Status: DC

## 2020-03-31 MED ORDER — AMLODIPINE BESYLATE 5 MG PO TABS: ORAL_TABLET | ORAL | 1 refills | Status: DC

## 2020-03-31 MED ORDER — ATORVASTATIN CALCIUM 10 MG PO TABS: 10.0000 mg | ORAL_TABLET | Freq: Every day | ORAL | 1 refills | Status: DC

## 2020-03-31 MED ORDER — METOPROLOL TARTRATE 50 MG PO TABS: ORAL_TABLET | ORAL | 1 refills | Status: DC

## 2020-03-31 MED ORDER — CYCLOSPORINE 0.05 % OP EMUL: 2.0000 [drp] | Freq: Two times a day (BID) | OPHTHALMIC | 2 refills | Status: DC

## 2020-03-31 MED ORDER — LOSARTAN POTASSIUM 50 MG PO TABS: 25.0000 mg | ORAL_TABLET | Freq: Every day | ORAL | 1 refills | Status: DC

## 2020-03-31 NOTE — Progress Notes (Signed)
[facility] [place], [place] [address]  Internal MEDICINE  Office Visit Note  Patient Name: Jesus Crosby  [id number]  [id number]:[id number]  Date of Service: 03/31/2020  Chief Complaint  Patient presents with  . Medicare Wellness    look at feet   . Depression  . Gastroesophageal Reflux  . Hypertension     HPI Pt is here for routine health maintenance examination.  He is an overweight 54 yo male.  He has a history of depression, GERD, HTN, homocysturia,RVH on echo, polyneuropathy. His blood pressure is controlled at this time.  Denies Chest pain, Shortness of breath, palpitations, headache, or blurred vision. He currently takes norvasc and loartan for blood pressure.     Current Medication: Outpatient Encounter Medications as of 03/31/2020  Medication Sig Note  . Alpha-Lipoic Acid 600 MG CAPS Take 1 capsule (600 mg total) by mouth 2 (two) times daily.   Marland Kitchen ALPRAZolam (XANAX) 0.5 MG tablet Take 0.5 mg by mouth 4 (four) times daily. By dr reddy 03/25/2014: .   Marland Kitchen AMBULATORY NON FORMULARY MEDICATION Medication Name:  Diltiazem 2 %/ Lidocaine 5 % Apply pea size amount 1 inch inside the anus 3 times daily. For 8 weeks. Or until symptoms resolve.   Marland Kitchen amLODipine (NORVASC) 5 MG tablet Take one tab po bid   . aspirin EC 81 MG tablet Take 81 mg by mouth at bedtime.    Marland Kitchen atorvastatin (LIPITOR) 10 MG tablet Take 1 tablet (10 mg total) by mouth at bedtime.   . Betaine (CYSTADANE) POWD Take 8 Scoops by mouth 3 (three) times daily.   . cycloSPORINE (RESTASIS) 0.05 % ophthalmic emulsion Place 2 drops into both eyes 2 (two) times daily. Pt need 90 days ok to fill and next refills need to see eye dr   . dicyclomine (BENTYL) 10 MG capsule Take 1 capsule (10 mg total) by mouth 4 (four) times daily -  before meals and at bedtime.   . folic acid (FOLVITE) A999333 MCG tablet Take 1,200 mcg by mouth at bedtime.    . furosemide (LASIX) 20 MG tablet Take 1 tablet (20 mg total) by mouth daily.    Marland Kitchen gabapentin (NEURONTIN) 800 MG tablet Take 2 tablets by mouth 2 (two) times daily.    Marland Kitchen losartan (COZAAR) 50 MG tablet Take 0.5 tablets (25 mg total) by mouth at bedtime.   . metoprolol tartrate (LOPRESSOR) 50 MG tablet Take 1 and 1/2 tablets by mouth twice daily   . NONFORMULARY OR COMPOUNDED ITEM See pharmacy note   . polyethylene glycol powder (GLYCOLAX/MIRALAX) 17 GM/SCOOP powder Take 17 g by mouth as needed.   . pyridOXINE (VITAMIN B-6) 100 MG tablet Take 500 mg by mouth at bedtime.    . sertraline (ZOLOFT) 100 MG tablet Take 300 mg by mouth daily.    . Suvorexant (BELSOMRA) 20 MG TABS Take 20 mg by mouth at bedtime.   . vitamin B-12 (CYANOCOBALAMIN) 1000 MCG tablet Take 1,000 mcg by mouth at bedtime.   . [DISCONTINUED] clotrimazole (LOTRIMIN) 1 % cream Apply 1 application topically 2 (two) times daily.   . [DISCONTINUED] methocarbamol (ROBAXIN) 500 MG tablet Take 1 tablet (500 mg total) by mouth every 6 (six) hours as needed for muscle spasms.   . [DISCONTINUED] Methylcellulose, Laxative, (CITRUCEL) 500 MG TABS Take 2 tablets by mouth 3 (three) times daily.    No facility-administered encounter medications on file as of 03/31/2020.    Surgical History: Past Surgical History:  Procedure Laterality Date  . COLONOSCOPY WITH PROPOFOL N/A 07/30/2015   Procedure: COLONOSCOPY WITH PROPOFOL;  Surgeon: [name], MD;  Location: [facility];  Service: Endoscopy;  Laterality: N/A;  . DERMABRASION OF FACE     due to acne scars  . distal radius fracture of right hand (plates, screws, and pins)  2011  . ESOPHAGOGASTRODUODENOSCOPY (EGD) WITH PROPOFOL N/A 07/30/2015   Procedure: ESOPHAGOGASTRODUODENOSCOPY (EGD) WITH PROPOFOL;  Surgeon: [name], MD;  Location: [facility];  Service: Endoscopy;  Laterality: N/A;  . EYE SURGERY     LASER + SURG BIL   . GAS/FLUID EXCHANGE Right 09/17/2013   Procedure: GAS/FLUID EXCHANGE;  Surgeon: Hayden Pedro, MD;  Location: [place];  Service:  Ophthalmology;  Laterality: Right;  . gastric sleeve surgery  08/17/2016  . HIP ARTHROPLASTY Left   . INTRAOCULAR LENS INSERTION Bilateral    lens disease due to homocysteinuria  . JOINT REPLACEMENT Left    THR  . NASAL SEPTUM SURGERY  march 2016   @ [facility]  . PARS PLANA VITRECTOMY Right 09/17/2013   Procedure: PARS PLANA VITRECTOMY WITH 25G REMOVAL/SUTURE SECONDARY INTRAOCULAR LENS;  Surgeon: Hayden Pedro, MD;  Location: [facility];  Service: Ophthalmology;  Laterality: Right;  . PHOTOCOAGULATION Right 09/17/2013   Procedure: PHOTOCOAGULATION;  Surgeon: Hayden Pedro, MD;  Location: [place];  Service: Ophthalmology;  Laterality: Right;  HEADSCOPE LASER  . PULSE GENERATOR IMPLANT N/A 02/13/2019   Procedure: UNILATERAL PULSE GENERATOR IMPLANT;  Surgeon: Meade Maw, MD;  Location: [facility];  Service: Neurosurgery;  Laterality: N/A;  . scapholunate ligament reconstruction of right hand  2011  . UMBILICAL HERNIA REPAIR N/A 05/07/2015   Procedure: HERNIA REPAIR UMBILICAL ADULT;  Surgeon: Florene Glen, MD;  Location: [facility];  Service: General;  Laterality: N/A;  . WRIST SURGERY Right     Medical History: Past Medical History:  Diagnosis Date  . Anxiety   . Arthritis    knees,Right wrist  . Avascular necrosis ([place])   . Brain bleed ([place])   . Cataract    bilateral repair with lens implants  . Depression   . Family history of adverse reaction to anesthesia    n/v-mom  . GERD (gastroesophageal reflux disease)   . 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 Hypertension   . Idiopathic progressive polyneuropathy   . Lens disease   . Neuromuscular disorder ([place])   . Obesity, Class II, BMI 35-39.9   . OCD (obsessive compulsive disorder)   . Paresthesia 06/10/2015  . Sleep apnea   . Stroke [facility])    with brain bleed at age 57-no deficits  . Umbilical hernia     Family History: Family History  Problem Relation Age of Onset  . Breast cancer Mother   . Hypertension Mother   . Hypertension Father    . Stroke Maternal Grandmother   . Stroke Maternal Grandfather   . Stroke Paternal Grandmother   . Stroke Paternal Grandfather   . Stroke Other   . Colon cancer Neg Hx   . Esophageal cancer Neg Hx   . Rectal cancer Neg Hx   . Stomach cancer Neg Hx       Review of Systems  Constitutional: Negative.  Negative for chills, fatigue and unexpected weight change.  HENT: Negative.  Negative for congestion, rhinorrhea, sneezing and sore throat.   Eyes: Negative for redness.  Respiratory: Negative.  Negative for cough, chest tightness and shortness of breath.   Cardiovascular: Negative.  Negative  for chest pain and palpitations.  Gastrointestinal: Negative.  Negative for abdominal pain, constipation, diarrhea, nausea and vomiting.  Endocrine: Negative.   Genitourinary: Negative.  Negative for dysuria and frequency.  Musculoskeletal: Negative.  Negative for arthralgias, back pain, joint swelling and neck pain.  Skin: Negative.  Negative for rash.  Allergic/Immunologic: Negative.   Neurological: Negative.  Negative for tremors and numbness.  Hematological: Negative for adenopathy. Does not bruise/bleed easily.  Psychiatric/Behavioral: Negative.  Negative for behavioral problems, sleep disturbance and suicidal ideas. The patient is not nervous/anxious.      Vital Signs: BP 139/74   Pulse 82   Temp 98.4 F (36.9 C)   Resp 16   Ht 6\' 3"  (1.905 m)   Wt (!) 339 lb (153.8 kg)   SpO2 96%   BMI 42.37 kg/m    Physical Exam Vitals and nursing note reviewed.  Constitutional:      General: He is not in acute distress.    Appearance: He is well-developed. He is not diaphoretic.  HENT:     Head: Normocephalic and atraumatic.     Mouth/Throat:     Pharynx: No oropharyngeal exudate.  Eyes:     Pupils: Pupils are equal, round, and reactive to light.  Neck:     Thyroid: No thyromegaly.     Vascular: No JVD.     Trachea: No tracheal deviation.  Cardiovascular:     Rate and Rhythm:  Normal rate and regular rhythm.     Heart sounds: Normal heart sounds. No murmur. No friction rub. No gallop.   Pulmonary:     Effort: Pulmonary effort is normal. No respiratory distress.     Breath sounds: Normal breath sounds. No wheezing or rales.  Chest:     Chest wall: No tenderness.  Abdominal:     Palpations: Abdomen is soft.     Tenderness: There is no abdominal tenderness. There is no guarding.  Musculoskeletal:        General: Normal range of motion.     Cervical back: Normal range of motion and neck supple.  Lymphadenopathy:     Cervical: No cervical adenopathy.  Skin:    General: Skin is warm and dry.  Neurological:     Mental Status: He is alert and oriented to person, place, and time.     Cranial Nerves: No cranial nerve deficit.  Psychiatric:        Behavior: Behavior normal.        Thought Content: Thought content normal.        Judgment: Judgment normal.      LABS: No results found for this or any previous visit (from the past 2160 hour(s)).   Assessment/Plan: 1. Encounter for general adult medical examination with abnormal findings Pt is up to date on PHM, follow up on labs when results available.  - CBC with Differential/Platelet - Lipid Panel With LDL/HDL Ratio - TSH - T4, free - Comprehensive metabolic panel  2. OSA on CPAP Stable, continue to use cpap at night or anytime during sleep.  3. Essential hypertension, benign Refilled medications for HTN.   - amLODipine (NORVASC) 5 MG tablet; Take one tab po bid  Dispense: 180 tablet; Refill: 1 - furosemide (LASIX) 20 MG tablet; Take 1 tablet (20 mg total) by mouth daily.  Dispense: 90 tablet; Refill: 1 - losartan (COZAAR) 50 MG tablet; Take 0.5 tablets (25 mg total) by mouth at bedtime.  Dispense: 45 tablet; Refill: 1 - metoprolol tartrate (LOPRESSOR) 50 MG  tablet; Take 1 and 1/2 tablets by mouth twice daily  Dispense: 270 tablet; Refill: 1  4. Other hyperlipidemia Refilled lipitor, follow up on  lipid panel.  - atorvastatin (LIPITOR) 10 MG tablet; Take 1 tablet (10 mg total) by mouth at bedtime.  Dispense: 90 tablet; Refill: 1  5. GAD (generalized anxiety disorder) Controlled, continue present management.   6. Right ventricular hypertrophy by electrocardiography Repeat ECHO for surveillance. Last echo 2015  - ECHOCARDIOGRAM COMPLETE; Future  7. 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   8. Screening for malignant neoplasm of prostate - PSA  9. Neuropathy Ongoing symptoms, better controlled currently.  Continue present management.   10. Dysuria - Urinalysis, Routine w reflex microscopic  11. Dry eyes - cycloSPORINE (RESTASIS) 0.05 % ophthalmic emulsion; Place 2 drops into both eyes 2 (two) times daily. Pt need 90 days ok to fill  Dispense: 45 each; Refill: 2  General Counseling: Koehn verbalizes understanding of the findings of todays visit and agrees with plan of treatment. I have discussed any further diagnostic evaluation that may be needed or ordered today. We also reviewed his medications today. he has been encouraged to call the office with any questions or concerns that should arise related to todays visit.   Orders Placed This Encounter  Procedures  . Urinalysis, Routine w reflex microscopic  . CBC with Differential/Platelet  . Lipid Panel With LDL/HDL Ratio  . TSH  . T4, free  . Comprehensive metabolic panel  . PSA  . ECHOCARDIOGRAM COMPLETE    No orders of the defined types were placed in this encounter.   Time spent: 30 Minutes   This patient was seen by Orson Gear AGNP-C in Collaboration with Dr [name] as a part of collaborative care agreement    Kendell Bane AGNP-C Internal Medicine

## 2020-04-01 ENCOUNTER — Ambulatory Visit: Payer: Medicare HMO | Admitting: Nurse Practitioner

## 2020-04-01 LAB — URINALYSIS, ROUTINE W REFLEX MICROSCOPIC
Bilirubin, UA: NEGATIVE
Glucose, UA: NEGATIVE
Leukocytes,UA: NEGATIVE
Nitrite, UA: NEGATIVE
Protein,UA: NEGATIVE
RBC, UA: NEGATIVE
Specific Gravity, UA: 1.018 (ref 1.005–1.030)
Urobilinogen, Ur: 0.2 mg/dL (ref 0.2–1.0)
pH, UA: 6 (ref 5.0–7.5)

## 2020-04-06 DIAGNOSIS — T452X5A Adverse effect of vitamins, initial encounter: Secondary | ICD-10-CM | POA: Diagnosis not present

## 2020-04-06 DIAGNOSIS — E7211 Homocystinuria: Secondary | ICD-10-CM | POA: Diagnosis not present

## 2020-04-06 DIAGNOSIS — D512 Transcobalamin II deficiency: Secondary | ICD-10-CM | POA: Diagnosis not present

## 2020-04-06 DIAGNOSIS — G622 Polyneuropathy due to other toxic agents: Secondary | ICD-10-CM | POA: Diagnosis not present

## 2020-04-06 DIAGNOSIS — G5621 Lesion of ulnar nerve, right upper limb: Secondary | ICD-10-CM | POA: Diagnosis not present

## 2020-04-06 DIAGNOSIS — E669 Obesity, unspecified: Secondary | ICD-10-CM | POA: Diagnosis not present

## 2020-04-06 DIAGNOSIS — Z86718 Personal history of other venous thrombosis and embolism: Secondary | ICD-10-CM | POA: Diagnosis not present

## 2020-04-06 DIAGNOSIS — G629 Polyneuropathy, unspecified: Secondary | ICD-10-CM | POA: Diagnosis not present

## 2020-04-06 DIAGNOSIS — Z6841 Body Mass Index (BMI) 40.0 and over, adult: Secondary | ICD-10-CM | POA: Diagnosis not present

## 2020-04-06 DIAGNOSIS — G608 Other hereditary and idiopathic neuropathies: Secondary | ICD-10-CM | POA: Diagnosis not present

## 2020-04-06 DIAGNOSIS — G620 Drug-induced polyneuropathy: Secondary | ICD-10-CM | POA: Diagnosis not present

## 2020-04-06 DIAGNOSIS — G5611 Other lesions of median nerve, right upper limb: Secondary | ICD-10-CM | POA: Diagnosis not present

## 2020-04-06 DIAGNOSIS — Z131 Encounter for screening for diabetes mellitus: Secondary | ICD-10-CM | POA: Diagnosis not present

## 2020-04-06 DIAGNOSIS — I639 Cerebral infarction, unspecified: Secondary | ICD-10-CM | POA: Diagnosis not present

## 2020-04-09 ENCOUNTER — Other Ambulatory Visit: Payer: Self-pay | Admitting: Physician Assistant

## 2020-04-09 DIAGNOSIS — K5900 Constipation, unspecified: Secondary | ICD-10-CM

## 2020-04-09 DIAGNOSIS — R109 Unspecified abdominal pain: Secondary | ICD-10-CM

## 2020-04-09 DIAGNOSIS — K602 Anal fissure, unspecified: Secondary | ICD-10-CM

## 2020-04-09 DIAGNOSIS — K625 Hemorrhage of anus and rectum: Secondary | ICD-10-CM

## 2020-04-13 ENCOUNTER — Other Ambulatory Visit: Payer: Self-pay

## 2020-04-13 DIAGNOSIS — H04123 Dry eye syndrome of bilateral lacrimal glands: Secondary | ICD-10-CM

## 2020-04-13 MED ORDER — CYCLOSPORINE 0.05 % OP EMUL: 2.0000 [drp] | Freq: Two times a day (BID) | OPHTHALMIC | 0 refills | Status: DC

## 2020-04-15 ENCOUNTER — Other Ambulatory Visit: Payer: Self-pay | Admitting: Adult Health

## 2020-04-15 DIAGNOSIS — I10 Essential (primary) hypertension: Secondary | ICD-10-CM

## 2020-04-21 DIAGNOSIS — G5601 Carpal tunnel syndrome, right upper limb: Secondary | ICD-10-CM | POA: Diagnosis not present

## 2020-04-21 DIAGNOSIS — G5621 Lesion of ulnar nerve, right upper limb: Secondary | ICD-10-CM | POA: Diagnosis not present

## 2020-04-21 DIAGNOSIS — M25552 Pain in left hip: Secondary | ICD-10-CM | POA: Diagnosis not present

## 2020-04-24 ENCOUNTER — Other Ambulatory Visit: Payer: Medicare HMO

## 2020-04-27 DIAGNOSIS — Z0001 Encounter for general adult medical examination with abnormal findings: Secondary | ICD-10-CM | POA: Diagnosis not present

## 2020-04-27 DIAGNOSIS — E782 Mixed hyperlipidemia: Secondary | ICD-10-CM | POA: Diagnosis not present

## 2020-04-27 DIAGNOSIS — G622 Polyneuropathy due to other toxic agents: Secondary | ICD-10-CM | POA: Diagnosis not present

## 2020-04-27 DIAGNOSIS — D512 Transcobalamin II deficiency: Secondary | ICD-10-CM | POA: Diagnosis not present

## 2020-04-27 DIAGNOSIS — E039 Hypothyroidism, unspecified: Secondary | ICD-10-CM | POA: Diagnosis not present

## 2020-04-27 DIAGNOSIS — Z131 Encounter for screening for diabetes mellitus: Secondary | ICD-10-CM | POA: Diagnosis not present

## 2020-04-27 DIAGNOSIS — E7211 Homocystinuria: Secondary | ICD-10-CM | POA: Diagnosis not present

## 2020-04-28 ENCOUNTER — Ambulatory Visit: Payer: Medicare HMO | Admitting: Nurse Practitioner

## 2020-04-28 LAB — COMPREHENSIVE METABOLIC PANEL
ALT: 41 IU/L (ref 0–44)
AST: 33 IU/L (ref 0–40)
Albumin/Globulin Ratio: 1.2 (ref 1.2–2.2)
Albumin: 4.2 g/dL (ref 3.8–4.9)
Alkaline Phosphatase: 65 IU/L (ref 48–121)
BUN/Creatinine Ratio: 13 (ref 9–20)
BUN: 14 mg/dL (ref 6–24)
Bilirubin Total: 0.3 mg/dL (ref 0.0–1.2)
CO2: 22 mmol/L (ref 20–29)
Calcium: 9.1 mg/dL (ref 8.7–10.2)
Chloride: 95 mmol/L — ABNORMAL LOW (ref 96–106)
Creatinine, Ser: 1.06 mg/dL (ref 0.76–1.27)
GFR calc Af Amer: 92 mL/min/{1.73_m2} (ref >59)
GFR calc non Af Amer: 80 mL/min/{1.73_m2} (ref >59)
Globulin, Total: 3.5 g/dL (ref 1.5–4.5)
Glucose: 102 mg/dL — ABNORMAL HIGH (ref 65–99)
Potassium: 4.6 mmol/L (ref 3.5–5.2)
Sodium: 132 mmol/L — ABNORMAL LOW (ref 134–144)
Total Protein: 7.7 g/dL (ref 6.0–8.5)

## 2020-04-28 LAB — TSH: TSH: 1.11 u[IU]/mL (ref 0.450–4.500)

## 2020-04-28 LAB — LIPID PANEL WITH LDL/HDL RATIO
Cholesterol, Total: 191 mg/dL (ref 100–199)
HDL: 35 mg/dL — ABNORMAL LOW (ref >39)
LDL Chol Calc (NIH): 118 mg/dL — ABNORMAL HIGH (ref 0–99)
LDL/HDL Ratio: 3.4 ratio (ref 0.0–3.6)
Triglycerides: 218 mg/dL — ABNORMAL HIGH (ref 0–149)
VLDL Cholesterol Cal: 38 mg/dL (ref 5–40)

## 2020-04-28 LAB — CBC WITH DIFFERENTIAL/PLATELET
Basophils Absolute: 0 10*3/uL (ref 0.0–0.2)
Basos: 0 %
EOS (ABSOLUTE): 0.1 10*3/uL (ref 0.0–0.4)
Eos: 1 %
Hematocrit: 35.6 % — ABNORMAL LOW (ref 37.5–51.0)
Hemoglobin: 11.3 g/dL — ABNORMAL LOW (ref 13.0–17.7)
Immature Grans (Abs): 0 10*3/uL (ref 0.0–0.1)
Immature Granulocytes: 0 %
Lymphocytes Absolute: 1.2 10*3/uL (ref 0.7–3.1)
Lymphs: 26 %
MCH: 26.4 pg — ABNORMAL LOW (ref 26.6–33.0)
MCHC: 31.7 g/dL (ref 31.5–35.7)
MCV: 83 fL (ref 79–97)
Monocytes Absolute: 0.5 10*3/uL (ref 0.1–0.9)
Monocytes: 10 %
Neutrophils Absolute: 2.9 10*3/uL (ref 1.4–7.0)
Neutrophils: 63 %
Platelets: 208 10*3/uL (ref 150–450)
RBC: 4.28 x10E6/uL (ref 4.14–5.80)
RDW: 15.3 % (ref 11.6–15.4)
WBC: 4.7 10*3/uL (ref 3.4–10.8)

## 2020-04-28 LAB — T4, FREE: Free T4: 0.92 ng/dL (ref 0.82–1.77)

## 2020-04-29 ENCOUNTER — Telehealth: Payer: Self-pay

## 2020-04-29 NOTE — Telephone Encounter (Signed)
Called lmom informing patient of appointment on 05/01/2020. klh

## 2020-04-30 ENCOUNTER — Ambulatory Visit: Payer: Medicare HMO | Admitting: Adult Health

## 2020-05-01 ENCOUNTER — Ambulatory Visit: Payer: Medicare HMO

## 2020-05-01 ENCOUNTER — Other Ambulatory Visit: Payer: Self-pay

## 2020-05-01 DIAGNOSIS — I422 Other hypertrophic cardiomyopathy: Secondary | ICD-10-CM | POA: Diagnosis not present

## 2020-05-01 DIAGNOSIS — I517 Cardiomegaly: Secondary | ICD-10-CM

## 2020-05-03 DIAGNOSIS — G4733 Obstructive sleep apnea (adult) (pediatric): Secondary | ICD-10-CM | POA: Diagnosis not present

## 2020-05-05 ENCOUNTER — Telehealth: Payer: Self-pay

## 2020-05-05 NOTE — Telephone Encounter (Signed)
Confirmed appointment on 05/07/2020 and screened for covid. klh

## 2020-05-06 DIAGNOSIS — F429 Obsessive-compulsive disorder, unspecified: Secondary | ICD-10-CM | POA: Diagnosis not present

## 2020-05-06 DIAGNOSIS — F332 Major depressive disorder, recurrent severe without psychotic features: Secondary | ICD-10-CM | POA: Diagnosis not present

## 2020-05-07 ENCOUNTER — Encounter: Payer: Self-pay | Admitting: Adult Health

## 2020-05-07 ENCOUNTER — Telehealth: Payer: Self-pay

## 2020-05-07 ENCOUNTER — Ambulatory Visit (INDEPENDENT_AMBULATORY_CARE_PROVIDER_SITE_OTHER): Payer: Medicare HMO | Admitting: Adult Health

## 2020-05-07 ENCOUNTER — Other Ambulatory Visit: Payer: Self-pay

## 2020-05-07 VITALS — BP 116/61 | HR 71 | Temp 98.5°F | Resp 16 | Ht 75.0 in | Wt 342.0 lb

## 2020-05-07 DIAGNOSIS — Z125 Encounter for screening for malignant neoplasm of prostate: Secondary | ICD-10-CM

## 2020-05-07 DIAGNOSIS — E7849 Other hyperlipidemia: Secondary | ICD-10-CM

## 2020-05-07 DIAGNOSIS — D649 Anemia, unspecified: Secondary | ICD-10-CM | POA: Diagnosis not present

## 2020-05-07 DIAGNOSIS — F411 Generalized anxiety disorder: Secondary | ICD-10-CM | POA: Diagnosis not present

## 2020-05-07 DIAGNOSIS — E7211 Homocystinuria: Secondary | ICD-10-CM

## 2020-05-07 DIAGNOSIS — I10 Essential (primary) hypertension: Secondary | ICD-10-CM | POA: Diagnosis not present

## 2020-05-07 DIAGNOSIS — K219 Gastro-esophageal reflux disease without esophagitis: Secondary | ICD-10-CM

## 2020-05-07 DIAGNOSIS — E871 Hypo-osmolality and hyponatremia: Secondary | ICD-10-CM | POA: Diagnosis not present

## 2020-05-07 NOTE — Telephone Encounter (Signed)
Patient will call back to schedule 4 month follow up. Jesus Crosby

## 2020-05-07 NOTE — Progress Notes (Signed)
[facility] [place], [place] [address]  Internal MEDICINE  Office Visit Note  Patient Name: Jesus Crosby  [id number]  [id number]  Date of Service: 05/16/2020  Chief Complaint  Patient presents with  . Follow-up    echo results, needs ortho sx clearance  . Depression  . Gastroesophageal Reflux  . Hypertension    HPI  Pt is here to review echo and lab results. His echo was normal except for mild tricuspid regurgitation.  His labs show a sodium of 132, and a Hemoglobin and hematocrit of 11.3 and 35.6.  He reports he has been dealing with an anal fissure that was bleeding, however he has not had any bleeding in 4 weeks. His depression, GERD and HTN are well controlled.  He continues to have    Current Medication: Outpatient Encounter Medications as of 05/07/2020  Medication Sig Note  . Alpha-Lipoic Acid 600 MG CAPS Take 1 capsule (600 mg total) by mouth 2 (two) times daily.   Marland Kitchen ALPRAZolam (XANAX) 0.5 MG tablet Take 0.5 mg by mouth 4 (four) times daily. By dr reddy 03/25/2014: .   Marland Kitchen amLODipine (NORVASC) 5 MG tablet Take one tab po bid   . aspirin EC 81 MG tablet Take 81 mg by mouth at bedtime.    Marland Kitchen atorvastatin (LIPITOR) 10 MG tablet Take 1 tablet (10 mg total) by mouth at bedtime.   . Betaine (CYSTADANE) POWD Take 8 Scoops by mouth 3 (three) times daily.   . cycloSPORINE (RESTASIS) 0.05 % ophthalmic emulsion Place 2 drops into both eyes 2 (two) times daily. Pt need 90 days ok to fill   . dicyclomine (BENTYL) 10 MG capsule TAKE 1 CAPSULE(10 MG) BY MOUTH FOUR TIMES DAILY BEFORE MEALS AND AT BEDTIME   . folic acid (FOLVITE) A999333 MCG tablet Take 1,200 mcg by mouth at bedtime.    . furosemide (LASIX) 20 MG tablet TAKE 1 TABLET(20 MG) BY MOUTH DAILY   . gabapentin (NEURONTIN) 800 MG tablet Take 2 tablets by mouth 2 (two) times daily.    Marland Kitchen losartan (COZAAR) 50 MG tablet TAKE 1/2 TABLET(25 MG) BY MOUTH AT BEDTIME   . metoprolol tartrate (LOPRESSOR) 50 MG tablet TAKE  1 AND 1/2 TABLETS BY MOUTH TWICE DAILY   . NONFORMULARY OR COMPOUNDED ITEM See pharmacy note   . polyethylene glycol powder (GLYCOLAX/MIRALAX) 17 GM/SCOOP powder Take 17 g by mouth as needed.   . pyridOXINE (VITAMIN B-6) 100 MG tablet Take 500 mg by mouth at bedtime.    . sertraline (ZOLOFT) 100 MG tablet Take 300 mg by mouth daily.    . Suvorexant (BELSOMRA) 20 MG TABS Take 20 mg by mouth at bedtime.   . vitamin B-12 (CYANOCOBALAMIN) 1000 MCG tablet Take 1,000 mcg by mouth at bedtime.   . [DISCONTINUED] AMBULATORY NON FORMULARY MEDICATION Medication Name:  Diltiazem 2 %/ Lidocaine 5 % Apply pea size amount 1 inch inside the anus 3 times daily. For 8 weeks. Or until symptoms resolve.    No facility-administered encounter medications on file as of 05/07/2020.    Surgical History: Past Surgical History:  Procedure Laterality Date  . COLONOSCOPY WITH PROPOFOL N/A 07/30/2015   Procedure: COLONOSCOPY WITH PROPOFOL;  Surgeon: [name], MD;  Location: [facility];  Service: Endoscopy;  Laterality: N/A;  . DERMABRASION OF FACE     due to acne scars  . distal radius fracture of right hand (plates, screws, and pins)  2011  . ESOPHAGOGASTRODUODENOSCOPY (EGD)  WITH PROPOFOL N/A 07/30/2015   Procedure: ESOPHAGOGASTRODUODENOSCOPY (EGD) WITH PROPOFOL;  Surgeon: [name], MD;  [facility];  Service: Endoscopy;  Laterality: N/A;  . EYE SURGERY     LASER + SURG BIL   . GAS/FLUID EXCHANGE Right 09/17/2013   Procedure: GAS/FLUID EXCHANGE;  Surgeon: Hayden Pedro, MD;  Location: [place];  Service: Ophthalmology;  Laterality: Right;  . gastric sleeve surgery  08/17/2016  . HIP ARTHROPLASTY Left   . INTRAOCULAR LENS INSERTION Bilateral    lens disease due to homocysteinuria  . JOINT REPLACEMENT Left    THR  . NASAL SEPTUM SURGERY  march 2016   @ [facility]  . PARS PLANA VITRECTOMY Right 09/17/2013   Procedure: PARS PLANA VITRECTOMY WITH 25G REMOVAL/SUTURE SECONDARY INTRAOCULAR LENS;   Surgeon: Hayden Pedro, MD;  Location: [place];  Service: Ophthalmology;  Laterality: Right;  . PHOTOCOAGULATION Right 09/17/2013   Procedure: PHOTOCOAGULATION;  Surgeon: Hayden Pedro, MD;  Location: [place];  Service: Ophthalmology;  Laterality: Right;  HEADSCOPE LASER  . PULSE GENERATOR IMPLANT N/A 02/13/2019   Procedure: UNILATERAL PULSE GENERATOR IMPLANT;  Surgeon: Meade Maw, MD;  Location: [facility];  Service: Neurosurgery;  Laterality: N/A;  . scapholunate ligament reconstruction of right hand  2011  . UMBILICAL HERNIA REPAIR N/A 05/07/2015   Procedure: HERNIA REPAIR UMBILICAL ADULT;  Surgeon: Florene Glen, MD;  Location: [facility];  Service: General;  Laterality: N/A;  . WRIST SURGERY Right     Medical History: Past Medical History:  Diagnosis Date  . Anxiety   . Arthritis    knees,Right wrist  . Avascular necrosis ([place])   . Brain bleed ([place])   . Cataract    bilateral repair with lens implants  . Depression   . Family history of adverse reaction to anesthesia    n/v-mom  . GERD (gastroesophageal reflux disease)   . 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 Hypertension   . Idiopathic progressive polyneuropathy   . Lens disease   . Neuromuscular disorder ([place])   . Obesity, Class II, BMI 35-39.9   . OCD (obsessive compulsive disorder)   . Paresthesia 06/10/2015  . Sleep apnea   . Stroke [facility])    with brain bleed at age 59-no deficits  . Umbilical hernia     Family History: Family History  Problem Relation Age of Onset  . Breast cancer Mother   . Hypertension Mother   . Hypertension Father   . Stroke Maternal Grandmother   . Stroke Maternal Grandfather   . Stroke Paternal Grandmother   . Stroke Paternal Grandfather   . Stroke Other   . Colon cancer Neg Hx   . Esophageal cancer Neg Hx   . Rectal cancer Neg Hx   . Stomach cancer Neg Hx     Social History   Socioeconomic History  . Marital status: Single    Spouse name: n/a  . Number of children: 0  . Years of  education: college  . Highest education level: Not on file  Occupational History  . Occupation: unemployed/disabled    Comment: Lexicographer  Tobacco Use  . Smoking status: Never Smoker  . Smokeless tobacco: Never Used  Vaping Use  . Vaping Use: Never used  Substance and Sexual Activity  . Alcohol use: No    Alcohol/week: 0.0 standard drinks  . Drug use: No  . Sexual activity: Not on file  Other Topics Concern  . Not on file  Social History Narrative  Lives alone. Parents live nearby.  Applying for disability (OCD) due to wrist injury, hearing upcoming.      Patient drinks 4-5 cups of caffeine daily.   Patient is right handed.   Social Determinants of Health   Financial Resource Strain:   . Difficulty of Paying Living Expenses:   Food Insecurity:   . Worried About Charity fundraiser in the Last Year:   . Arboriculturist in the Last Year:   Transportation Needs:   . Film/video editor (Medical):   Marland Kitchen Lack of Transportation (Non-Medical):   Physical Activity:   . Days of Exercise per Week:   . Minutes of Exercise per Session:   Stress:   . Feeling of Stress :   Social Connections:   . Frequency of Communication with Friends and Family:   . Frequency of Social Gatherings with Friends and Family:   . Attends Religious Services:   . Active Member of Clubs or Organizations:   . Attends Archivist Meetings:   Marland Kitchen Marital Status:   Intimate Partner Violence:   . Fear of Current or Ex-Partner:   . Emotionally Abused:   Marland Kitchen Physically Abused:   . Sexually Abused:       Review of Systems  Constitutional: Negative.  Negative for chills, fatigue and unexpected weight change.  HENT: Negative.  Negative for congestion, rhinorrhea, sneezing and sore throat.   Eyes: Negative for redness.  Respiratory: Negative.  Negative for cough, chest tightness and shortness of breath.   Cardiovascular: Negative.  Negative for chest pain and palpitations.   Gastrointestinal: Negative.  Negative for abdominal pain, constipation, diarrhea, nausea and vomiting.  Endocrine: Negative.   Genitourinary: Negative.  Negative for dysuria and frequency.  Musculoskeletal: Negative.  Negative for arthralgias, back pain, joint swelling and neck pain.  Skin: Negative.  Negative for rash.  Allergic/Immunologic: Negative.   Neurological: Negative.  Negative for tremors and numbness.  Hematological: Negative for adenopathy. Does not bruise/bleed easily.  Psychiatric/Behavioral: Negative.  Negative for behavioral problems, sleep disturbance and suicidal ideas. The patient is not nervous/anxious.     Vital Signs: BP 116/61   Pulse 71   Temp 98.5 F (36.9 C)   Resp 16   Ht 6\' 3"  (1.905 m)   Wt (!) 342 lb (155.1 kg)   SpO2 94%   BMI 42.75 kg/m    Physical Exam Vitals and nursing note reviewed.  Constitutional:      General: He is not in acute distress.    Appearance: He is well-developed. He is not diaphoretic.  HENT:     Head: Normocephalic and atraumatic.     Mouth/Throat:     Pharynx: No oropharyngeal exudate.  Eyes:     Pupils: Pupils are equal, round, and reactive to light.  Neck:     Thyroid: No thyromegaly.     Vascular: No JVD.     Trachea: No tracheal deviation.  Cardiovascular:     Rate and Rhythm: Normal rate and regular rhythm.     Heart sounds: Normal heart sounds. No murmur. No friction rub. No gallop.   Pulmonary:     Effort: Pulmonary effort is normal. No respiratory distress.     Breath sounds: Normal breath sounds. No wheezing or rales.  Chest:     Chest wall: No tenderness.  Abdominal:     Palpations: Abdomen is soft.     Tenderness: There is no abdominal tenderness. There is no guarding.  Musculoskeletal:  General: Normal range of motion.     Cervical back: Normal range of motion and neck supple.  Lymphadenopathy:     Cervical: No cervical adenopathy.  Skin:    General: Skin is warm and dry.  Neurological:      Mental Status: He is alert and oriented to person, place, and time.     Cranial Nerves: No cranial nerve deficit.  Psychiatric:        Behavior: Behavior normal.        Thought Content: Thought content normal.        Judgment: Judgment normal.    Assessment/Plan: 1. Hyponatremia Recheck sodium level.  - Sodium  2. Low hemoglobin Recheck hemoglobin as discussed.  - CBC w/Diff/Platelet  3. Essential hypertension, benign Controlled, continue current meds.  4. Other hyperlipidemia Stable, continue to follow.  5. GAD (generalized anxiety disorder) Well controlled, continue as before.   6. Homocystinuria ([place]) Stable, continue to follow.  7. Screening for malignant neoplasm of prostate - PSA  General Counseling: jezrael korb understanding of the findings of todays visit and agrees with plan of treatment. I have discussed any further diagnostic evaluation that may be needed or ordered today. We also reviewed his medications today. he has been encouraged to call the office with any questions or concerns that should arise related to todays visit.    Orders Placed This Encounter  Procedures  . PSA  . CBC w/Diff/Platelet  . Sodium    No orders of the defined types were placed in this encounter.   Time spent: 30 Minutes   This patient was seen by Orson Gear AGNP-C in Collaboration with Dr [name] as a part of collaborative care agreement     Kendell Bane AGNP-C Internal medicine

## 2020-05-08 ENCOUNTER — Telehealth: Payer: Self-pay | Admitting: Nurse Practitioner

## 2020-05-08 ENCOUNTER — Telehealth: Payer: Self-pay | Admitting: Physician Assistant

## 2020-05-08 LAB — CBC WITH DIFFERENTIAL/PLATELET
Basophils Absolute: 0 10*3/uL (ref 0.0–0.2)
Basos: 0 %
EOS (ABSOLUTE): 0.1 10*3/uL (ref 0.0–0.4)
Eos: 1 %
Hematocrit: 34.1 % — ABNORMAL LOW (ref 37.5–51.0)
Hemoglobin: 10.9 g/dL — ABNORMAL LOW (ref 13.0–17.7)
Immature Grans (Abs): 0 10*3/uL (ref 0.0–0.1)
Immature Granulocytes: 0 %
Lymphocytes Absolute: 1.6 10*3/uL (ref 0.7–3.1)
Lymphs: 27 %
MCH: 26.1 pg — ABNORMAL LOW (ref 26.6–33.0)
MCHC: 32 g/dL (ref 31.5–35.7)
MCV: 82 fL (ref 79–97)
Monocytes Absolute: 0.9 10*3/uL (ref 0.1–0.9)
Monocytes: 15 %
Neutrophils Absolute: 3.4 10*3/uL (ref 1.4–7.0)
Neutrophils: 57 %
Platelets: 211 10*3/uL (ref 150–450)
RBC: 4.17 x10E6/uL (ref 4.14–5.80)
RDW: 15.5 % — ABNORMAL HIGH (ref 11.6–15.4)
WBC: 6 10*3/uL (ref 3.4–10.8)

## 2020-05-08 LAB — PSA: Prostate Specific Ag, Serum: 0.9 ng/mL (ref 0.0–4.0)

## 2020-05-08 LAB — SODIUM: Sodium: 134 mmol/L (ref 134–144)

## 2020-05-08 NOTE — Telephone Encounter (Signed)
Patient calling is very concerned and would like to get a call by the end of the day

## 2020-05-08 NOTE — Telephone Encounter (Signed)
Hi, I don't know the type of anesthesia he is getting, if local then its ok but for General anesthesia, as long as anesthesiologist is ok, I don't have a problem.

## 2020-05-08 NOTE — Telephone Encounter (Signed)
Patient is calling- states that hes had a anal fissure and that was bleeding. But has stopped but had hes hemoglobin checked yesterday and it had dropped to a 10.9 and hes doctor Dr. Chancy Milroy was concerned. Patient wanted to speak with you.

## 2020-05-10 NOTE — Telephone Encounter (Signed)
Beth, I have no seen patient in office, previously managed phone call when on call. Pt was last seen in office by Amy. I cannot clear patient for ? surgery. Due to his drop in Hg, I would advise scheduling the patient for an office visit this week with Amy or any App who has availability. I will CC Amy on this message.

## 2020-05-11 ENCOUNTER — Telehealth: Payer: Self-pay | Admitting: Student in an Organized Health Care Education/Training Program

## 2020-05-11 ENCOUNTER — Telehealth: Payer: Self-pay | Admitting: Physician Assistant

## 2020-05-11 ENCOUNTER — Other Ambulatory Visit (INDEPENDENT_AMBULATORY_CARE_PROVIDER_SITE_OTHER): Payer: Medicare HMO

## 2020-05-11 ENCOUNTER — Other Ambulatory Visit: Payer: Self-pay

## 2020-05-11 DIAGNOSIS — D649 Anemia, unspecified: Secondary | ICD-10-CM

## 2020-05-11 DIAGNOSIS — G629 Polyneuropathy, unspecified: Secondary | ICD-10-CM

## 2020-05-11 LAB — CBC WITH DIFFERENTIAL/PLATELET
Basophils Absolute: 0 10*3/uL (ref 0.0–0.1)
Basophils Relative: 0.8 % (ref 0.0–3.0)
Eosinophils Absolute: 0.1 10*3/uL (ref 0.0–0.7)
Eosinophils Relative: 1.2 % (ref 0.0–5.0)
HCT: 34.3 % — ABNORMAL LOW (ref 39.0–52.0)
Hemoglobin: 11.2 g/dL — ABNORMAL LOW (ref 13.0–17.0)
Lymphocytes Relative: 24.1 % (ref 12.0–46.0)
Lymphs Abs: 1.4 10*3/uL (ref 0.7–4.0)
MCHC: 32.6 g/dL (ref 30.0–36.0)
MCV: 80.2 fl (ref 78.0–100.0)
Monocytes Absolute: 0.9 10*3/uL (ref 0.1–1.0)
Monocytes Relative: 15.7 % — ABNORMAL HIGH (ref 3.0–12.0)
Neutro Abs: 3.3 10*3/uL (ref 1.4–7.7)
Neutrophils Relative %: 58.2 % (ref 43.0–77.0)
Platelets: 230 10*3/uL (ref 150.0–400.0)
RBC: 4.27 Mil/uL (ref 4.22–5.81)
RDW: 16.4 % — ABNORMAL HIGH (ref 11.5–15.5)
WBC: 5.7 10*3/uL (ref 4.0–10.5)

## 2020-05-11 NOTE — Telephone Encounter (Signed)
Please call pt - I think he called in today - he is trying to get cleared for surgery  of some sort - we havent seen since 08/2019. HGB had dropped a titch.. needs repeat CBC today or early am tomorrow

## 2020-05-11 NOTE — Telephone Encounter (Signed)
Pt called and stated that Dr Holley Raring told him he would look into finding a doctor for SCS in the neck for neuropathy in his hands. Pt wanted an update to see if Dr Holley Raring had found anyone?

## 2020-05-11 NOTE — Telephone Encounter (Signed)
Nicoletta Ba, PA has sent orders in another telephone encounter. Patient will have CBC drawn today for tomorrow's appointment.

## 2020-05-11 NOTE — Telephone Encounter (Signed)
You note states if Dr. Consuela Mimes doesn't want to do this, you will refer to Dr. Lacinda Axon or Dr. Mearl Latin.  Need this referral please.

## 2020-05-11 NOTE — Telephone Encounter (Signed)
Yes, thank you.  The patient has been pro-active in handling this. He has scheduled an appointment for 05/12/20. Spoke with him. He understands the need for a repeat of the CBC. He will try to get this done today.

## 2020-05-11 NOTE — Telephone Encounter (Signed)
Spoke with the patient earlier. The plan is to come for a CBC and keep his appointment with Nicoletta Ba, PA tomorrow. Hgb was 10.9 on 05/07/20 and 11.3 on 04/27/20. He has not seen bloody or dark stools.

## 2020-05-11 NOTE — Telephone Encounter (Signed)
Pt is scheduled for an OV tomorrow, 05/12/20 but requested to speak to a nurse.

## 2020-05-12 ENCOUNTER — Ambulatory Visit: Payer: Medicare HMO | Admitting: Physician Assistant

## 2020-05-12 NOTE — Telephone Encounter (Signed)
fyi

## 2020-05-13 ENCOUNTER — Ambulatory Visit: Payer: Medicare HMO | Admitting: Gastroenterology

## 2020-05-13 ENCOUNTER — Telehealth: Payer: Self-pay | Admitting: Gastroenterology

## 2020-05-13 ENCOUNTER — Other Ambulatory Visit (INDEPENDENT_AMBULATORY_CARE_PROVIDER_SITE_OTHER): Payer: Medicare HMO

## 2020-05-13 ENCOUNTER — Encounter: Payer: Self-pay | Admitting: Gastroenterology

## 2020-05-13 DIAGNOSIS — D649 Anemia, unspecified: Secondary | ICD-10-CM

## 2020-05-13 LAB — IBC PANEL
Iron: 55 ug/dL (ref 42–165)
Saturation Ratios: 10.6 % — ABNORMAL LOW (ref 20.0–50.0)
Transferrin: 372 mg/dL — ABNORMAL HIGH (ref 212.0–360.0)

## 2020-05-13 NOTE — Progress Notes (Signed)
Review of pertinent gastrointestinal problems: 1.  Adenomatous colon polyps.  colonoscopy in February 2020 with finding of a small anal fissure and 3 small sessile polyps were removed.  He had random biopsies done for complaints of diarrhea and these were negative for microscopic colitis.  Path on the polyps were tubular adenomas 2.  Chronic anal fissure.  Was referred to [facility] surgery in 2020.  [facility] surgery visit January 2021, no obvious anal fissure.  Positive anal skin tag.  He was recommended to simply follow his bleeding if it got worse then they would proceed with exam under anesthesia. 3.  Rectal bleeding 2016 led to endoscopic testing.  EGD Dr. Ardis Hughs August 2016 was completely normal.  Colonoscopy August 2016 question possible mild inflammation in the left colon however biopsies were all completely normal.  A small tubular adenoma was also removed.   HPI: This is a very pleasant 54 year old man  I have only seen him in the office once, 5 years ago, and his initial [facility] [facility] visit when he transferred care from [place]..  Since then I have performed colonoscopy twice and upper endoscopy once.  He has seen several of our extenders over the past 5 years as well in the office.  He has had his labs checked 3 times over the past 2 weeks and his hemoglobin hovers around 11.  1 year ago it was 13.6.  He is normocytic.  His RDW was slightly elevated.  He has not had any issues with minor rectal bleeding, anal fissure in 3 or 4 months now.  He does have alternating bowel habits with a very hard stool in the morning and then looser stools later on in the day.  He moves his bowels every day.  At one point he was taking MiraLAX twice a day but this was causing significant diarrhea.  He has gained 60 or 70 pounds in the past year or so  He is concerned because his blood counts are lower than they were a year ago.  ROS: complete GI ROS as described in HPI, all other review  negative.  Constitutional:  No unintentional weight loss   Past Medical History:  Diagnosis Date  . Anxiety   . Arthritis    knees,Right wrist  . Avascular necrosis ([place])   . Brain bleed ([place])   . Cataract    bilateral repair with lens implants  . Depression   . Family history of adverse reaction to anesthesia    n/v-mom  . GERD (gastroesophageal reflux disease)   . 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 Hypertension   . Idiopathic progressive polyneuropathy   . Lens disease   . Neuromuscular disorder ([place])   . Obesity, Class II, BMI 35-39.9   . OCD (obsessive compulsive disorder)   . Paresthesia 06/10/2015  . Sleep apnea   . Stroke [facility])    with brain bleed at age 28-no deficits  . Umbilical hernia     Past Surgical History:  Procedure Laterality Date  . COLONOSCOPY WITH PROPOFOL N/A 07/30/2015   Procedure: COLONOSCOPY WITH PROPOFOL;  Surgeon: [name], MD;  [facility];  Service: Endoscopy;  Laterality: N/A;  . DERMABRASION OF FACE     due to acne scars  . distal radius fracture of right hand (plates, screws, and pins)  2011  . ESOPHAGOGASTRODUODENOSCOPY (EGD) WITH PROPOFOL N/A 07/30/2015   Procedure: ESOPHAGOGASTRODUODENOSCOPY (EGD) WITH PROPOFOL;  Surgeon: [name], MD;  [facility];  Service: Endoscopy;  Laterality: N/A;  . EYE SURGERY     LASER + SURG BIL   . GAS/FLUID EXCHANGE Right 09/17/2013   Procedure: GAS/FLUID EXCHANGE;  Surgeon: Hayden Pedro, MD;  Location: [place];  Service: Ophthalmology;  Laterality: Right;  . gastric sleeve surgery  08/17/2016  . HIP ARTHROPLASTY Left   . INTRAOCULAR LENS INSERTION Bilateral    lens disease due to homocysteinuria  . JOINT REPLACEMENT Left    THR  . NASAL SEPTUM SURGERY  march 2016   @ [facility]  . PARS PLANA VITRECTOMY Right 09/17/2013   Procedure: PARS PLANA VITRECTOMY WITH 25G REMOVAL/SUTURE SECONDARY INTRAOCULAR LENS;  Surgeon: Hayden Pedro, MD;  Location: [place];  Service: Ophthalmology;   Laterality: Right;  . PHOTOCOAGULATION Right 09/17/2013   Procedure: PHOTOCOAGULATION;  Surgeon: Hayden Pedro, MD;  Location: [place];  Service: Ophthalmology;  Laterality: Right;  HEADSCOPE LASER  . PULSE GENERATOR IMPLANT N/A 02/13/2019   Procedure: UNILATERAL PULSE GENERATOR IMPLANT;  Surgeon: Meade Maw, MD;  Location: [facility];  Service: Neurosurgery;  Laterality: N/A;  . scapholunate ligament reconstruction of right hand  2011  . UMBILICAL HERNIA REPAIR N/A 05/07/2015   Procedure: HERNIA REPAIR UMBILICAL ADULT;  Surgeon: Florene Glen, MD;  Location: [facility];  Service: General;  Laterality: N/A;  . WRIST SURGERY Right     Current Outpatient Medications  Medication Sig Dispense Refill  . Alpha-Lipoic Acid 600 MG CAPS Take 1 capsule (600 mg total) by mouth 2 (two) times daily. 180 capsule 2  . ALPRAZolam (XANAX) 0.5 MG tablet Take 0.5 mg by mouth 4 (four) times daily. By dr reddy    . amLODipine (NORVASC) 5 MG tablet Take one tab po bid 180 tablet 1  . aspirin EC 81 MG tablet Take 81 mg by mouth at bedtime.     Marland Kitchen atorvastatin (LIPITOR) 10 MG tablet Take 1 tablet (10 mg total) by mouth at bedtime. 90 tablet 1  . Betaine (CYSTADANE) POWD Take 8 Scoops by mouth 3 (three) times daily.    . cycloSPORINE (RESTASIS) 0.05 % ophthalmic emulsion Place 2 drops into both eyes 2 (two) times daily. Pt need 90 days ok to fill 60 each 0  . dicyclomine (BENTYL) 10 MG capsule TAKE 1 CAPSULE(10 MG) BY MOUTH FOUR TIMES DAILY BEFORE MEALS AND AT BEDTIME 505 capsule 0  . folic acid (FOLVITE) 397 MCG tablet Take 1,200 mcg by mouth at bedtime.     . furosemide (LASIX) 20 MG tablet TAKE 1 TABLET(20 MG) BY MOUTH DAILY 90 tablet 1  . gabapentin (NEURONTIN) 800 MG tablet Take 2 tablets by mouth 2 (two) times daily.     Marland Kitchen losartan (COZAAR) 50 MG tablet TAKE 1/2 TABLET(25 MG) BY MOUTH AT BEDTIME 45 tablet 1  . metoprolol tartrate (LOPRESSOR) 50 MG tablet TAKE 1 AND 1/2 TABLETS BY MOUTH TWICE DAILY 270  tablet 1  . NONFORMULARY OR COMPOUNDED ITEM See pharmacy note 120 each 1  . polyethylene glycol powder (GLYCOLAX/MIRALAX) 17 GM/SCOOP powder Take 17 g by mouth as needed.    . pyridOXINE (VITAMIN B-6) 100 MG tablet Take 500 mg by mouth at bedtime.     . sertraline (ZOLOFT) 100 MG tablet Take 300 mg by mouth daily.     . Suvorexant (BELSOMRA) 20 MG TABS Take 20 mg by mouth at bedtime.    . vitamin B-12 (CYANOCOBALAMIN) 1000 MCG tablet Take 1,000 mcg by mouth at bedtime.     No current facility-administered medications for  this visit.    Allergies as of 05/13/2020 - Review Complete 05/13/2020  Allergen Reaction Noted  . Pineapple Swelling 05/23/2013  . Amoxicillin Nausea And Vomiting   . Keflex [cephalexin] Diarrhea 01/25/2019    Family History  Problem Relation Age of Onset  . Breast cancer Mother   . Hypertension Mother   . Hypertension Father   . Stroke Maternal Grandmother   . Stroke Maternal Grandfather   . Stroke Paternal Grandmother   . Stroke Paternal Grandfather   . Stroke Other   . Colon cancer Neg Hx   . Esophageal cancer Neg Hx   . Rectal cancer Neg Hx   . Stomach cancer Neg Hx     Social History   Socioeconomic History  . Marital status: Single    Spouse name: n/a  . Number of children: 0  . Years of education: college  . Highest education level: Not on file  Occupational History  . Occupation: unemployed/disabled    Comment: Lexicographer  Tobacco Use  . Smoking status: Never Smoker  . Smokeless tobacco: Never Used  Substance and Sexual Activity  . Alcohol use: No    Alcohol/week: 0.0 standard drinks  . Drug use: No  . Sexual activity: Not on file  Other Topics Concern  . Not on file  Social History Narrative   Lives alone. Parents live nearby.  Applying for disability (OCD) due to wrist injury, hearing upcoming.      Patient drinks 4-5 cups of caffeine daily.   Patient is right handed.   Social Determinants of Health   Financial  Resource Strain:   . Difficulty of Paying Living Expenses:   Food Insecurity:   . Worried About Charity fundraiser in the Last Year:   . Arboriculturist in the Last Year:   Transportation Needs:   . Film/video editor (Medical):   Marland Kitchen Lack of Transportation (Non-Medical):   Physical Activity:   . Days of Exercise per Week:   . Minutes of Exercise per Session:   Stress:   . Feeling of Stress :   Social Connections:   . Frequency of Communication with Friends and Family:   . Frequency of Social Gatherings with Friends and Family:   . Attends Religious Services:   . Active Member of Clubs or Organizations:   . Attends Archivist Meetings:   Marland Kitchen Marital Status:   Intimate Partner Violence:   . Fear of Current or Ex-Partner:   . Emotionally Abused:   Marland Kitchen Physically Abused:   . Sexually Abused:      Physical Exam: Pulse 79   Ht 6\' 3"  (1.905 m)   Wt (!) 346 lb 4 oz (157.1 kg)   BMI 43.28 kg/m  Constitutional: generally well-appearing Psychiatric: alert and oriented x3 Abdomen: soft, nontender, nondistended, no obvious ascites, no peritoneal signs, normal bowel sounds No peripheral edema noted in lower extremities  Assessment and plan: 54 y.o. male with normocytic anemia  He is not having any overt GI bleeding.  I recommended upper endoscopy to exclude upper GI source.  He has had a bariatric surgery, he thinks it was a sleeve surgery, perhaps he has a small anastomotic ulcer.  Perhaps his slight anemia is related to poor iron malabsorption.  With his alternating bowel habits I recommended he start taking Citrucel on a daily basis.  Please see the "Patient Instructions" section for addition details about the plan.  Owens Loffler, MD [facility] 05/13/2020, 8:50  AM   Total time on date of encounter was 30 minutes (this included time spent preparing to see the patient reviewing records; obtaining and/or reviewing separately obtained history; performing a  medically appropriate exam and/or evaluation; counseling and educating the patient and family if present; ordering medications, tests or procedures if applicable; and documenting clinical information in the health record).

## 2020-05-13 NOTE — Telephone Encounter (Signed)
Patient notified we will contact him with Dr. Eugenia Pancoast recommendations when he has reviewed.

## 2020-05-13 NOTE — Patient Instructions (Addendum)
If you are age 54 or older, your body mass index should be between 23-30. Your Body mass index is 43.28 kg/m. If this is out of the aforementioned range listed, please consider follow up with your Primary Care Provider.  Your provider has requested that you go to the basement level for lab work before leaving today. Press "B" on the elevator. The lab is located at the first door on the left as you exit the elevator.  If you are age 34 or younger, your body mass index should be between 19-25. Your Body mass index is 43.28 kg/m. If this is out of the aformentioned range listed, please consider follow up with your Primary Care Provider.   You have been scheduled for an endoscopy. Please follow written instructions given to you at your visit today. If you use inhalers (even only as needed), please bring them with you on the day of your procedure.  Due to recent changes in healthcare laws, you may see the results of your imaging and laboratory studies on MyChart before your provider has had a chance to review them.  We understand that in some cases there may be results that are confusing or concerning to you. Not all laboratory results come back in the same time frame and the provider may be waiting for multiple results in order to interpret others.  Please give [place] 48 hours in order for your provider to thoroughly review all the results before contacting the office for clarification of your results.   Please purchase the following medications over the counter and take as directed:  START: citrucel (orange flavored) powder fiber supplement.  This may cause some bloating at first but that usually goes away. Begin with a small spoonful and work your way up to a large, heaping spoonful daily over a week.  Thank you for entrusting me with your care and choosing [facility].  Dr Ardis Hughs

## 2020-05-13 NOTE — Telephone Encounter (Signed)
Patient called in reference to his lab results went over them through [place]

## 2020-05-14 ENCOUNTER — Encounter: Payer: Self-pay | Admitting: Gastroenterology

## 2020-05-14 ENCOUNTER — Telehealth: Payer: Self-pay | Admitting: Gastroenterology

## 2020-05-14 DIAGNOSIS — D649 Anemia, unspecified: Secondary | ICD-10-CM

## 2020-05-14 MED ORDER — FERROUS GLUCONATE 324 (38 FE) MG PO TABS: 324.0000 mg | ORAL_TABLET | Freq: Every day | ORAL | 3 refills | Status: DC

## 2020-05-14 NOTE — Telephone Encounter (Signed)
Pt called to inform that pharmacy does not carry iron supplement ferrous gluconate 325mg  only 50mg  but it is out of stock. Pharmacist told pt that 325 mg is usually a prescription so pt wants to know if Dr. Ardis Hughs can prescribe it. Pt would like a call back with an update on this.

## 2020-05-14 NOTE — Telephone Encounter (Signed)
rx sent to the pharmacy Patient notified

## 2020-05-15 ENCOUNTER — Encounter: Payer: Self-pay | Admitting: Student in an Organized Health Care Education/Training Program

## 2020-05-15 ENCOUNTER — Telehealth: Payer: Self-pay | Admitting: Gastroenterology

## 2020-05-15 ENCOUNTER — Telehealth: Payer: Self-pay

## 2020-05-15 NOTE — Telephone Encounter (Signed)
I am not certain however I think he should leave the spinal stimulator turned on.  Tell him I am going to ask Mr. Despina Hick, who runs our anesthesia in the [facility] about this.  Jesus Crosby, what do you think?

## 2020-05-15 NOTE — Telephone Encounter (Signed)
Dr.Jacobs,  This pt may have his procedure with his spinal pump intact.  Thanks,  Osvaldo Angst

## 2020-05-15 NOTE — Telephone Encounter (Signed)
Patient thinks that iron may be causing him to have some nausea.  He would like to know if iron can cause this and if so what can he take for it?

## 2020-05-15 NOTE — Telephone Encounter (Signed)
Pt is scheduled for an EGD.  He stated that he has a spinal cord stimulator for pain management and inquired whether that needs to be turned off for procedure.

## 2020-05-15 NOTE — Telephone Encounter (Signed)
Patient aware that he may proceed with EGD with spinal pump intact and on.  Patient states that he will probably turn stimulator off as it is uncomfortable when he lays flat for a long period of time.

## 2020-05-18 ENCOUNTER — Telehealth: Payer: Self-pay | Admitting: Gastroenterology

## 2020-05-18 ENCOUNTER — Telehealth: Payer: Self-pay | Admitting: *Deleted

## 2020-05-18 MED ORDER — FERROUS SULFATE DRIED ER 45 MG PO TBCR: 45.0000 mg | EXTENDED_RELEASE_TABLET | Freq: Every day | ORAL | 6 refills | Status: AC

## 2020-05-18 NOTE — Telephone Encounter (Signed)
Left patient a message as Dr Ardis Hughs advised.

## 2020-05-18 NOTE — Telephone Encounter (Signed)
Pt has been having some rectal bleeding again that started Sunday and today. He's scheduled for an EGD tomorrow will let Dr. Ardis Hughs know.

## 2020-05-18 NOTE — Telephone Encounter (Signed)
Let him know I called a type of iron that may cause less nausea.

## 2020-05-18 NOTE — Telephone Encounter (Signed)
Pt is scheduled for an EGD 05/19/20 and stated that yesterday he experienced rectal bleeding.  Pt stated that he started taking iron 05/14/20.

## 2020-05-19 ENCOUNTER — Encounter: Payer: Self-pay | Admitting: *Deleted

## 2020-05-19 ENCOUNTER — Encounter: Payer: Self-pay | Admitting: Gastroenterology

## 2020-05-19 ENCOUNTER — Ambulatory Visit (AMBULATORY_SURGERY_CENTER): Payer: Medicare HMO | Admitting: Gastroenterology

## 2020-05-19 ENCOUNTER — Other Ambulatory Visit: Payer: Self-pay | Admitting: Gastroenterology

## 2020-05-19 ENCOUNTER — Other Ambulatory Visit: Payer: Self-pay

## 2020-05-19 VITALS — BP 132/70 | HR 70 | Temp 98.9°F | Resp 15 | Ht 75.0 in | Wt 346.0 lb

## 2020-05-19 DIAGNOSIS — K2970 Gastritis, unspecified, without bleeding: Secondary | ICD-10-CM

## 2020-05-19 DIAGNOSIS — D649 Anemia, unspecified: Secondary | ICD-10-CM | POA: Diagnosis not present

## 2020-05-19 DIAGNOSIS — K3189 Other diseases of stomach and duodenum: Secondary | ICD-10-CM

## 2020-05-19 DIAGNOSIS — K2950 Unspecified chronic gastritis without bleeding: Secondary | ICD-10-CM | POA: Diagnosis not present

## 2020-05-19 MED ORDER — SODIUM CHLORIDE 0.9 % IV SOLN: 500.0000 mL | Freq: Once | INTRAVENOUS | Status: DC

## 2020-05-19 NOTE — Op Note (Signed)
[facility] Patient Name: Jesus Crosby Procedure Date: 05/19/2020 1:43 PM MRN: [id number] Endoscopist: [name] , MD Age: 54 Referring MD:  Date of Birth: August 11, 1966 Gender: Male Account #: [id number] Procedure:                Upper GI endoscopy Indications:              Iron deficiency anemia, mild Medicines:                Monitored Anesthesia Care Procedure:                Pre-Anesthesia Assessment:                           - Prior to the procedure, a History and Physical                            was performed, and patient medications and                            allergies were reviewed. The patient's tolerance of                            previous anesthesia was also reviewed. The risks                            and benefits of the procedure and the sedation                            options and risks were discussed with the patient.                            All questions were answered, and informed consent                            was obtained. Prior Anticoagulants: The patient has                            taken no previous anticoagulant or antiplatelet                            agents. ASA Grade Assessment: III - A patient with                            severe systemic disease. After reviewing the risks                            and benefits, the patient was deemed in                            satisfactory condition to undergo the procedure.                           After obtaining informed consent, the endoscope was  passed under direct vision. Throughout the                            procedure, the patient's blood pressure, pulse, and                            oxygen saturations were monitored continuously. The                            Endoscope was introduced through the mouth, and                            advanced to the second part of duodenum. The upper                            GI endoscopy was  accomplished without difficulty.                            The patient tolerated the procedure well. Scope In: Scope Out: Findings:                 Minimal inflammation characterized by erythema and                            friability was found in the gastric antrum.                            Biopsies were taken with a cold forceps for                            histology.                           The exam was otherwise without abnormality. Complications:            No immediate complications. Estimated blood loss:                            None. Estimated Blood Loss:     Estimated blood loss: none. Impression:               - Mild gastritis, biopsied to check for H. pylori.                           - The examination was otherwise normal. Recommendation:           - Patient has a contact number available for                            emergencies. The signs and symptoms of potential                            delayed complications were discussed with the                            patient. Return to normal activities tomorrow.  Written discharge instructions were provided to the                            patient.                           - Resume previous diet.                           - Continue present medications. Continue once daily                            iron supplement for now.                           - Await pathology results. If + for H. pylori, will                            start on appropriate antibiotics. [name], MD 05/19/2020 2:14:43 PM This report has been signed electronically.

## 2020-05-19 NOTE — Progress Notes (Signed)
Called to room to assist during endoscopic procedure.  Patient ID and intended procedure confirmed with present staff. Received instructions for my participation in the procedure from the performing physician.  

## 2020-05-19 NOTE — Patient Instructions (Signed)
Handout given for Gastritis.  YOU HAD AN ENDOSCOPIC PROCEDURE TODAY AT THE [facility]:   Refer to the procedure report that was given to you for any specific questions about what was found during the examination.  If the procedure report does not answer your questions, please call your gastroenterologist to clarify.  If you requested that your care partner not be given the details of your procedure findings, then the procedure report has been included in a sealed envelope for you to review at your convenience later.  YOU SHOULD EXPECT: Some feelings of bloating in the abdomen. Passage of more gas than usual.  Walking can help get rid of the air that was put into your GI tract during the procedure and reduce the bloating. If you had a lower endoscopy (such as a colonoscopy or flexible sigmoidoscopy) you may notice spotting of blood in your stool or on the toilet paper. If you underwent a bowel prep for your procedure, you may not have a normal bowel movement for a few days.  Please Note:  You might notice some irritation and congestion in your nose or some drainage.  This is from the oxygen used during your procedure.  There is no need for concern and it should clear up in a day or so.  SYMPTOMS TO REPORT IMMEDIATELY:   Following upper endoscopy (EGD)  Vomiting of blood or coffee ground material  New chest pain or pain under the shoulder blades  Painful or persistently difficult swallowing  New shortness of breath  Fever of 100F or higher  Black, tarry-looking stools  For urgent or emergent issues, a gastroenterologist can be reached at any hour by calling [phone]. Do not use MyChart messaging for urgent concerns.    DIET:  We do recommend a small meal at first, but then you may proceed to your regular diet.  Drink plenty of fluids but you should avoid alcoholic beverages for 24 hours.  ACTIVITY:  You should plan to take it easy for the rest of today and you should NOT  DRIVE or use heavy machinery until tomorrow (because of the sedation medicines used during the test).    FOLLOW UP: Our staff will call the number listed on your records 48-72 hours following your procedure to check on you and address any questions or concerns that you may have regarding the information given to you following your procedure. If we do not reach you, we will leave a message.  We will attempt to reach you two times.  During this call, we will ask if you have developed any symptoms of COVID 19. If you develop any symptoms (ie: fever, flu-like symptoms, shortness of breath, cough etc.) before then, please call [phone].  If you test positive for Covid 19 in the 2 weeks post procedure, please call and report this information to us.    If any biopsies were taken you will be contacted by phone or by letter within the next 1-3 weeks.  Please call us at [phone] if you have not heard about the biopsies in 3 weeks.    SIGNATURES/CONFIDENTIALITY: You and/or your care partner have signed paperwork which will be entered into your electronic medical record.  These signatures attest to the fact that that the information above on your After Visit Summary has been reviewed and is understood.  Full responsibility of the confidentiality of this discharge information lies with you and/or your care-partner. 

## 2020-05-19 NOTE — Telephone Encounter (Signed)
Called patient and left a message that Dr. Ardis Hughs is ok with him proceeding with EGD today. Let him know that if he had any questions he could call us back.

## 2020-05-19 NOTE — Progress Notes (Signed)
A/ox3, pleased with MAC, report to RN 

## 2020-05-19 NOTE — Telephone Encounter (Signed)
I was off yesterday and so just seeing this now. He is ok to proceed with EGD today.

## 2020-05-19 NOTE — Progress Notes (Signed)
VS CW  Pt's states no medical or surgical changes since previsit or office visit.vs

## 2020-05-21 ENCOUNTER — Telehealth: Payer: Self-pay | Admitting: *Deleted

## 2020-05-21 NOTE — Telephone Encounter (Signed)
  Follow up Call-  Call back number 05/19/2020 01/08/2019  Post procedure Call Back phone  # [phone]  Permission to leave phone message Yes Yes  Some recent data might be hidden     Patient questions:  Do you have a fever, pain , or abdominal swelling? Yes.   Pain Score  2 *  Have you tolerated food without any problems? Yes.    Have you been able to return to your normal activities? Yes.    Do you have any questions about your discharge instructions: Diet   No. Medications  No. Follow up visit  No.  Do you have questions or concerns about your Care? No.  Actions: * If pain score is 4 or above: No action needed, pain <4.  1. Have you developed a fever since your procedure? no  2.   Have you had an respiratory symptoms (SOB or cough) since your procedure? no  3.   Have you tested positive for COVID 19 since your procedure no  4.   Have you had any family members/close contacts diagnosed with the COVID 19 since your procedure?  no   If yes to any of these questions please route to Joylene John, RN and Erenest Rasher, RN

## 2020-05-22 ENCOUNTER — Telehealth: Payer: Self-pay | Admitting: Gastroenterology

## 2020-05-22 NOTE — Telephone Encounter (Signed)
Pt is requesting to speak with a nurse to discuss the diarrhea he has been experiencing since his procedure.

## 2020-05-22 NOTE — Telephone Encounter (Signed)
Left message on machine to call back  

## 2020-05-25 ENCOUNTER — Encounter: Payer: Self-pay | Admitting: Gastroenterology

## 2020-05-25 NOTE — Telephone Encounter (Signed)
Error

## 2020-05-25 NOTE — Telephone Encounter (Signed)
The pt had a question about pathology result.  We discussed the result and all questions answered.

## 2020-05-25 NOTE — Telephone Encounter (Signed)
Pt said he is returning your call [phone]

## 2020-05-26 ENCOUNTER — Telehealth: Payer: Self-pay | Admitting: Gastroenterology

## 2020-05-26 NOTE — Telephone Encounter (Signed)
Called pt no answer and voice mail not set up

## 2020-05-26 NOTE — Telephone Encounter (Signed)
Pt is requesting a call back from a nurse to see if he can come in for lab work. Pt didn't know much information but kept stating Dr Ardis Hughs is aware of his situation.

## 2020-05-27 ENCOUNTER — Telehealth: Payer: Self-pay | Admitting: Gastroenterology

## 2020-05-27 DIAGNOSIS — D649 Anemia, unspecified: Secondary | ICD-10-CM

## 2020-05-27 NOTE — Telephone Encounter (Signed)
Dr Ardis Hughs the pt would like his Hgb rechecked is this ok to order?

## 2020-05-28 ENCOUNTER — Telehealth: Payer: Self-pay

## 2020-05-28 NOTE — Telephone Encounter (Signed)
Pt called again to f/u on this message. He would like to know when he can come for lab work.

## 2020-05-28 NOTE — Telephone Encounter (Signed)
Pt called that he like to do labs advised him to wait for respond from GI office

## 2020-05-29 ENCOUNTER — Other Ambulatory Visit (INDEPENDENT_AMBULATORY_CARE_PROVIDER_SITE_OTHER): Payer: Medicare HMO

## 2020-05-29 DIAGNOSIS — D649 Anemia, unspecified: Secondary | ICD-10-CM | POA: Diagnosis not present

## 2020-05-29 LAB — CBC WITH DIFFERENTIAL/PLATELET
Basophils Absolute: 0.1 10*3/uL (ref 0.0–0.1)
Basophils Relative: 1 % (ref 0.0–3.0)
Eosinophils Absolute: 0.1 10*3/uL (ref 0.0–0.7)
Eosinophils Relative: 1.2 % (ref 0.0–5.0)
HCT: 35.1 % — ABNORMAL LOW (ref 39.0–52.0)
Hemoglobin: 11.8 g/dL — ABNORMAL LOW (ref 13.0–17.0)
Lymphocytes Relative: 26.9 % (ref 12.0–46.0)
Lymphs Abs: 1.7 10*3/uL (ref 0.7–4.0)
MCHC: 33.6 g/dL (ref 30.0–36.0)
MCV: 81.3 fl (ref 78.0–100.0)
Monocytes Absolute: 0.8 10*3/uL (ref 0.1–1.0)
Monocytes Relative: 12.8 % — ABNORMAL HIGH (ref 3.0–12.0)
Neutro Abs: 3.7 10*3/uL (ref 1.4–7.7)
Neutrophils Relative %: 58.1 % (ref 43.0–77.0)
Platelets: 196 10*3/uL (ref 150.0–400.0)
RBC: 4.31 Mil/uL (ref 4.22–5.81)
RDW: 17.6 % — ABNORMAL HIGH (ref 11.5–15.5)
WBC: 6.4 10*3/uL (ref 4.0–10.5)

## 2020-05-29 NOTE — Telephone Encounter (Signed)
I called back and got the extension to the OR where she is working today.  I was told you can call and ask for her at [phone] ext [phone]

## 2020-05-29 NOTE — Telephone Encounter (Signed)
  Dr Ardis Hughs the number is [phone]) [phone].  That is the office number.  I was told you can call that number and they will transfer you to her.

## 2020-05-29 NOTE — Telephone Encounter (Signed)
The pt has been advised to come in for labs in 3 weeks.

## 2020-05-29 NOTE — Telephone Encounter (Signed)
Lets get a repeat CBC in 3 to 4 weeks

## 2020-06-09 NOTE — Telephone Encounter (Signed)
Dr Ardis Hughs the pt is requesting a 90 day supply of pantoprazole be sent to his pharmacy.  You suggested OTC is the RX ok to send?

## 2020-06-10 ENCOUNTER — Other Ambulatory Visit: Payer: Self-pay

## 2020-06-10 MED ORDER — PANTOPRAZOLE SODIUM 40 MG PO TBEC
40.0000 mg | DELAYED_RELEASE_TABLET | Freq: Every day | ORAL | 3 refills | Status: DC
Start: 2020-06-10 — End: 2021-05-17

## 2020-06-11 ENCOUNTER — Telehealth: Payer: Self-pay

## 2020-06-11 ENCOUNTER — Other Ambulatory Visit: Payer: Self-pay | Admitting: Adult Health

## 2020-06-11 DIAGNOSIS — E871 Hypo-osmolality and hyponatremia: Secondary | ICD-10-CM

## 2020-06-11 DIAGNOSIS — D649 Anemia, unspecified: Secondary | ICD-10-CM | POA: Diagnosis not present

## 2020-06-11 NOTE — Telephone Encounter (Signed)
Spoke to Pt about surgical clearance. Jesus Crosby ordered labs for Pt to complete. Pt is going to have GI send over paperwork to sign.

## 2020-06-11 NOTE — Progress Notes (Unsigned)
Reordered labs for patient for surgical clearance.

## 2020-06-12 ENCOUNTER — Telehealth: Payer: Self-pay

## 2020-06-12 LAB — CBC WITH DIFFERENTIAL/PLATELET
Basophils Absolute: 0 x10E3/uL (ref 0.0–0.2)
Basos: 0 %
EOS (ABSOLUTE): 0.1 x10E3/uL (ref 0.0–0.4)
Eos: 1 %
Hematocrit: 37.6 % (ref 37.5–51.0)
Hemoglobin: 11.9 g/dL — ABNORMAL LOW (ref 13.0–17.7)
Immature Grans (Abs): 0 x10E3/uL (ref 0.0–0.1)
Immature Granulocytes: 0 %
Lymphocytes Absolute: 1.5 x10E3/uL (ref 0.7–3.1)
Lymphs: 26 %
MCH: 26.9 pg (ref 26.6–33.0)
MCHC: 31.6 g/dL (ref 31.5–35.7)
MCV: 85 fL (ref 79–97)
Monocytes Absolute: 0.7 x10E3/uL (ref 0.1–0.9)
Monocytes: 12 %
Neutrophils Absolute: 3.5 x10E3/uL (ref 1.4–7.0)
Neutrophils: 61 %
Platelets: 195 x10E3/uL (ref 150–450)
RBC: 4.43 x10E6/uL (ref 4.14–5.80)
RDW: 18.3 % — ABNORMAL HIGH (ref 11.6–15.4)
WBC: 5.9 x10E3/uL (ref 3.4–10.8)

## 2020-06-12 LAB — SODIUM: Sodium: 132 mmol/L — ABNORMAL LOW (ref 134–144)

## 2020-06-12 NOTE — Telephone Encounter (Signed)
Pt called this morning that adam will cleared him surgery as per adam advised pt he will clear him we did surgery clearance paper from orthopedic and go head and go for preopp

## 2020-06-15 ENCOUNTER — Ambulatory Visit (INDEPENDENT_AMBULATORY_CARE_PROVIDER_SITE_OTHER): Payer: Medicare HMO | Admitting: Adult Health

## 2020-06-15 ENCOUNTER — Other Ambulatory Visit: Payer: Self-pay

## 2020-06-15 VITALS — BP 127/76 | HR 74 | Temp 98.0°F | Resp 16 | Ht 75.0 in | Wt 348.2 lb

## 2020-06-15 DIAGNOSIS — Z01818 Encounter for other preprocedural examination: Secondary | ICD-10-CM

## 2020-06-15 DIAGNOSIS — D649 Anemia, unspecified: Secondary | ICD-10-CM

## 2020-06-15 DIAGNOSIS — E871 Hypo-osmolality and hyponatremia: Secondary | ICD-10-CM

## 2020-06-15 DIAGNOSIS — F411 Generalized anxiety disorder: Secondary | ICD-10-CM | POA: Diagnosis not present

## 2020-06-15 DIAGNOSIS — I10 Essential (primary) hypertension: Secondary | ICD-10-CM

## 2020-06-15 NOTE — Progress Notes (Signed)
[facility] [place], [place] [address]  Internal MEDICINE  Office Visit Note  Patient Name: Jesus Crosby  [id number]  [id number]  Date of Service: 06/15/2020  Chief Complaint  Patient presents with  . Follow-up    sx clearance  . Depression  . Gastroesophageal Reflux  . Hypertension    HPI  Pt is here for surgical clearance and follow up on depression, gerd and htn.  He reports overall he is doing well.  He is set to have surgery in 5 days on his right wrist.  He has two nerve entrapments that they will try to decompress. His Sodium level is 132, he does drink a lot of water.  We discussed that he should continue to drink Gatorade and salt his food to taste. His hemoglobin is stable at 11.9 currently.          Current Medication: Outpatient Encounter Medications as of 06/15/2020  Medication Sig Note  . Alpha-Lipoic Acid 600 MG CAPS Take 1 capsule (600 mg total) by mouth 2 (two) times daily.   Marland Kitchen ALPRAZolam (XANAX) 0.5 MG tablet Take 0.5 mg by mouth 4 (four) times daily. By dr reddy 03/25/2014: .   Marland Kitchen amLODipine (NORVASC) 5 MG tablet Take one tab po bid   . aspirin EC 81 MG tablet Take 81 mg by mouth at bedtime.    Marland Kitchen atorvastatin (LIPITOR) 10 MG tablet Take 1 tablet (10 mg total) by mouth at bedtime.   . Betaine (CYSTADANE) POWD Take 8 Scoops by mouth 3 (three) times daily.   . cycloSPORINE (RESTASIS) 0.05 % ophthalmic emulsion Place 2 drops into both eyes 2 (two) times daily. Pt need 90 days ok to fill   . dicyclomine (BENTYL) 10 MG capsule TAKE 1 CAPSULE(10 MG) BY MOUTH FOUR TIMES DAILY BEFORE MEALS AND AT BEDTIME   . ferrous gluconate (FERGON) 324 MG tablet Take 1 tablet (324 mg total) by mouth daily with breakfast.   . Ferrous Sulfate Dried 45 MG TBCR Take 45 mg by mouth at bedtime.   . folic acid (FOLVITE) 299 MCG tablet Take 1,200 mcg by mouth at bedtime.    . furosemide (LASIX) 20 MG tablet TAKE 1 TABLET(20 MG) BY MOUTH DAILY   . gabapentin  (NEURONTIN) 800 MG tablet Take 2 tablets by mouth 2 (two) times daily.    Marland Kitchen losartan (COZAAR) 50 MG tablet TAKE 1/2 TABLET(25 MG) BY MOUTH AT BEDTIME   . metoprolol tartrate (LOPRESSOR) 50 MG tablet TAKE 1 AND 1/2 TABLETS BY MOUTH TWICE DAILY   . NONFORMULARY OR COMPOUNDED ITEM See pharmacy note   . pantoprazole (PROTONIX) 40 MG tablet Take 1 tablet (40 mg total) by mouth daily.   . polyethylene glycol powder (GLYCOLAX/MIRALAX) 17 GM/SCOOP powder Take 17 g by mouth as needed.   . pyridOXINE (VITAMIN B-6) 100 MG tablet Take 500 mg by mouth at bedtime.    . sertraline (ZOLOFT) 100 MG tablet Take 300 mg by mouth daily.    . Suvorexant (BELSOMRA) 20 MG TABS Take 20 mg by mouth at bedtime.   . vitamin B-12 (CYANOCOBALAMIN) 1000 MCG tablet Take 1,000 mcg by mouth at bedtime.    No facility-administered encounter medications on file as of 06/15/2020.    Surgical History: Past Surgical History:  Procedure Laterality Date  . COLONOSCOPY  2020  . COLONOSCOPY WITH PROPOFOL N/A 07/30/2015   Procedure: COLONOSCOPY WITH PROPOFOL;  Surgeon: [name], MD;  Location: [facility];  Service:  Endoscopy;  Laterality: N/A;  . DERMABRASION OF FACE     due to acne scars  . distal radius fracture of right hand (plates, screws, and pins)  2011  . ESOPHAGOGASTRODUODENOSCOPY (EGD) WITH PROPOFOL N/A 07/30/2015   Procedure: ESOPHAGOGASTRODUODENOSCOPY (EGD) WITH PROPOFOL;  Surgeon: [name], MD;  Location: [facility];  Service: Endoscopy;  Laterality: N/A;  . EYE SURGERY     LASER + SURG BIL   . GAS/FLUID EXCHANGE Right 09/17/2013   Procedure: GAS/FLUID EXCHANGE;  Surgeon: Hayden Pedro, MD;  Location: [place];  Service: Ophthalmology;  Laterality: Right;  . gastric sleeve surgery  08/17/2016  . HIP ARTHROPLASTY Left   . INTRAOCULAR LENS INSERTION Bilateral    lens disease due to homocysteinuria  . JOINT REPLACEMENT Left    THR  . NASAL SEPTUM SURGERY  march 2016   @ [facility]  . PARS PLANA VITRECTOMY  Right 09/17/2013   Procedure: PARS PLANA VITRECTOMY WITH 25G REMOVAL/SUTURE SECONDARY INTRAOCULAR LENS;  Surgeon: Hayden Pedro, MD;  Location: [place];  Service: Ophthalmology;  Laterality: Right;  . PHOTOCOAGULATION Right 09/17/2013   Procedure: PHOTOCOAGULATION;  Surgeon: Hayden Pedro, MD;  Location: [place];  Service: Ophthalmology;  Laterality: Right;  HEADSCOPE LASER  . PULSE GENERATOR IMPLANT N/A 02/13/2019   Procedure: UNILATERAL PULSE GENERATOR IMPLANT;  Surgeon: Meade Maw, MD;  Location: [facility];  Service: Neurosurgery;  Laterality: N/A;  . scapholunate ligament reconstruction of right hand  2011  . UMBILICAL HERNIA REPAIR N/A 05/07/2015   Procedure: HERNIA REPAIR UMBILICAL ADULT;  Surgeon: Florene Glen, MD;  Location: [facility];  Service: General;  Laterality: N/A;  . UPPER GASTROINTESTINAL ENDOSCOPY  2012  . WRIST SURGERY Right     Medical History: Past Medical History:  Diagnosis Date  . Anxiety   . Arthritis    knees,Right wrist  . Avascular necrosis ([place])   . Brain bleed ([place])   . Cataract    bilateral repair with lens implants  . Depression   . Family history of adverse reaction to anesthesia    n/v-mom  . GERD (gastroesophageal reflux disease)   . 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 Hypertension   . Idiopathic progressive polyneuropathy   . Lens disease   . Neuromuscular disorder ([place])   . Obesity, Class II, BMI 35-39.9   . OCD (obsessive compulsive disorder)   . Oxygen deficiency    2 liters  . Paresthesia 06/10/2015  . Sleep apnea    cpap  . Stroke [facility])    with brain bleed at age 64-no deficits  . Umbilical hernia     Family History: Family History  Problem Relation Age of Onset  . Breast cancer Mother   . Hypertension Mother   . Colon polyps Mother   . Hypertension Father   . Colon polyps Father   . Stroke Maternal Grandmother   . Stroke Maternal Grandfather   . Stroke Paternal Grandmother   . Stroke Paternal Grandfather   . Stroke Other   .  Colon cancer Neg Hx   . Esophageal cancer Neg Hx   . Rectal cancer Neg Hx   . Stomach cancer Neg Hx     Social History   Socioeconomic History  . Marital status: Single    Spouse name: n/a  . Number of children: 0  . Years of education: college  . Highest education level: Not on file  Occupational History  . Occupation: unemployed/disabled    Comment: Lexicographer  Tobacco Use  . Smoking status: Never Smoker  . Smokeless tobacco: Never Used  Vaping Use  . Vaping Use: Never used  Substance and Sexual Activity  . Alcohol use: No    Alcohol/week: 0.0 standard drinks  . Drug use: No  . Sexual activity: Not on file  Other Topics Concern  . Not on file  Social History Narrative   Lives alone. Parents live nearby.  Applying for disability (OCD) due to wrist injury, hearing upcoming.      Patient drinks 4-5 cups of caffeine daily.   Patient is right handed.   Social Determinants of Health   Financial Resource Strain:   . Difficulty of Paying Living Expenses:   Food Insecurity:   . Worried About Charity fundraiser in the Last Year:   . Arboriculturist in the Last Year:   Transportation Needs:   . Film/video editor (Medical):   Marland Kitchen Lack of Transportation (Non-Medical):   Physical Activity:   . Days of Exercise per Week:   . Minutes of Exercise per Session:   Stress:   . Feeling of Stress :   Social Connections:   . Frequency of Communication with Friends and Family:   . Frequency of Social Gatherings with Friends and Family:   . Attends Religious Services:   . Active Member of Clubs or Organizations:   . Attends Archivist Meetings:   Marland Kitchen Marital Status:   Intimate Partner Violence:   . Fear of Current or Ex-Partner:   . Emotionally Abused:   Marland Kitchen Physically Abused:   . Sexually Abused:       Review of Systems  Constitutional: Negative.  Negative for chills, fatigue and unexpected weight change.  HENT: Negative.  Negative for congestion,  rhinorrhea, sneezing and sore throat.   Eyes: Negative for redness.  Respiratory: Negative.  Negative for cough, chest tightness and shortness of breath.   Cardiovascular: Negative.  Negative for chest pain and palpitations.  Gastrointestinal: Negative.  Negative for abdominal pain, constipation, diarrhea, nausea and vomiting.  Endocrine: Negative.   Genitourinary: Negative.  Negative for dysuria and frequency.  Musculoskeletal: Negative.  Negative for arthralgias, back pain, joint swelling and neck pain.  Skin: Negative.  Negative for rash.  Allergic/Immunologic: Negative.   Neurological: Negative.  Negative for tremors and numbness.  Hematological: Negative for adenopathy. Does not bruise/bleed easily.  Psychiatric/Behavioral: Negative.  Negative for behavioral problems, sleep disturbance and suicidal ideas. The patient is not nervous/anxious.     Vital Signs: BP 127/76   Pulse 74   Temp 98 F (36.7 C)   Resp 16   Ht 6\' 3"  (1.905 m)   Wt (!) 348 lb 3.2 oz (157.9 kg)   SpO2 92%   BMI 43.52 kg/m    Physical Exam Vitals and nursing note reviewed.  Constitutional:      General: He is not in acute distress.    Appearance: He is well-developed. He is not diaphoretic.  HENT:     Head: Normocephalic and atraumatic.     Mouth/Throat:     Pharynx: No oropharyngeal exudate.  Eyes:     Pupils: Pupils are equal, round, and reactive to light.  Neck:     Thyroid: No thyromegaly.     Vascular: No JVD.     Trachea: No tracheal deviation.  Cardiovascular:     Rate and Rhythm: Normal rate and regular rhythm.     Heart sounds: Normal heart sounds. No murmur heard.  No friction rub. No gallop.   Pulmonary:     Effort: Pulmonary effort is normal. No respiratory distress.     Breath sounds: Normal breath sounds. No wheezing or rales.  Chest:     Chest wall: No tenderness.  Abdominal:     Palpations: Abdomen is soft.     Tenderness: There is no abdominal tenderness. There is no  guarding.  Musculoskeletal:        General: Normal range of motion.     Cervical back: Normal range of motion and neck supple.  Lymphadenopathy:     Cervical: No cervical adenopathy.  Skin:    General: Skin is warm and dry.  Neurological:     Mental Status: He is alert and oriented to person, place, and time.     Cranial Nerves: No cranial nerve deficit.  Psychiatric:        Behavior: Behavior normal.        Thought Content: Thought content normal.        Judgment: Judgment normal.    Assessment/Plan: 1. Pre-op testing EKG stable, patient is clear for surgery.  Patient risk vs benefit should be considered at time of procedure by surgical team and  - EKG 12-Lead  2. Hyponatremia Stable encouraged continued consumption of Gatorade.  Patient encouraged to cut down on his water intake currently.   3. Essential hypertension, benign Stable, continue current management.   4. GAD (generalized anxiety disorder) Stable, continue current management.   5. Low hemoglobin Stable, and improved.   General Counseling: Jesus Crosby understanding of the findings of todays visit and agrees with plan of treatment. I have discussed any further diagnostic evaluation that may be needed or ordered today. We also reviewed his medications today. he has been encouraged to call the office with any questions or concerns that should arise related to todays visit.    Orders Placed This Encounter  Procedures  . EKG 12-Lead    No orders of the defined types were placed in this encounter.   Time spent: 30 Minutes   This patient was seen by Orson Gear AGNP-C in Collaboration with Dr [name] as a part of collaborative care agreement     Kendell Bane AGNP-C Internal medicine

## 2020-06-16 ENCOUNTER — Other Ambulatory Visit: Payer: Self-pay | Admitting: Physician Assistant

## 2020-06-16 DIAGNOSIS — R109 Unspecified abdominal pain: Secondary | ICD-10-CM

## 2020-06-16 DIAGNOSIS — K625 Hemorrhage of anus and rectum: Secondary | ICD-10-CM

## 2020-06-16 DIAGNOSIS — K602 Anal fissure, unspecified: Secondary | ICD-10-CM

## 2020-06-16 DIAGNOSIS — K5900 Constipation, unspecified: Secondary | ICD-10-CM

## 2020-06-17 DIAGNOSIS — Z01818 Encounter for other preprocedural examination: Secondary | ICD-10-CM | POA: Diagnosis not present

## 2020-06-17 DIAGNOSIS — M25572 Pain in left ankle and joints of left foot: Secondary | ICD-10-CM | POA: Diagnosis not present

## 2020-06-17 DIAGNOSIS — M25571 Pain in right ankle and joints of right foot: Secondary | ICD-10-CM | POA: Diagnosis not present

## 2020-06-19 DIAGNOSIS — K219 Gastro-esophageal reflux disease without esophagitis: Secondary | ICD-10-CM | POA: Diagnosis not present

## 2020-06-19 DIAGNOSIS — G8918 Other acute postprocedural pain: Secondary | ICD-10-CM | POA: Diagnosis not present

## 2020-06-19 DIAGNOSIS — E669 Obesity, unspecified: Secondary | ICD-10-CM | POA: Diagnosis not present

## 2020-06-19 DIAGNOSIS — E7211 Homocystinuria: Secondary | ICD-10-CM | POA: Diagnosis not present

## 2020-06-19 DIAGNOSIS — F329 Major depressive disorder, single episode, unspecified: Secondary | ICD-10-CM | POA: Diagnosis not present

## 2020-06-19 DIAGNOSIS — G4733 Obstructive sleep apnea (adult) (pediatric): Secondary | ICD-10-CM | POA: Diagnosis not present

## 2020-06-19 DIAGNOSIS — G5621 Lesion of ulnar nerve, right upper limb: Secondary | ICD-10-CM | POA: Diagnosis not present

## 2020-06-19 DIAGNOSIS — I10 Essential (primary) hypertension: Secondary | ICD-10-CM | POA: Diagnosis not present

## 2020-06-19 DIAGNOSIS — M25531 Pain in right wrist: Secondary | ICD-10-CM | POA: Diagnosis not present

## 2020-06-19 DIAGNOSIS — G629 Polyneuropathy, unspecified: Secondary | ICD-10-CM | POA: Diagnosis not present

## 2020-06-19 DIAGNOSIS — G5601 Carpal tunnel syndrome, right upper limb: Secondary | ICD-10-CM | POA: Diagnosis not present

## 2020-06-25 DIAGNOSIS — M216X1 Other acquired deformities of right foot: Secondary | ICD-10-CM | POA: Diagnosis not present

## 2020-06-25 DIAGNOSIS — M216X2 Other acquired deformities of left foot: Secondary | ICD-10-CM | POA: Diagnosis not present

## 2020-06-25 DIAGNOSIS — M25572 Pain in left ankle and joints of left foot: Secondary | ICD-10-CM | POA: Diagnosis not present

## 2020-06-25 DIAGNOSIS — M25571 Pain in right ankle and joints of right foot: Secondary | ICD-10-CM | POA: Diagnosis not present

## 2020-06-30 ENCOUNTER — Other Ambulatory Visit: Payer: Self-pay

## 2020-06-30 DIAGNOSIS — D649 Anemia, unspecified: Secondary | ICD-10-CM

## 2020-07-02 ENCOUNTER — Other Ambulatory Visit: Payer: Self-pay | Admitting: Adult Health

## 2020-07-02 NOTE — Telephone Encounter (Signed)
Pt called to fill clotrimazole refills as per adam send pres to phar

## 2020-07-02 NOTE — Telephone Encounter (Signed)
Looks like this was taken care of?

## 2020-07-22 DIAGNOSIS — M216X2 Other acquired deformities of left foot: Secondary | ICD-10-CM | POA: Diagnosis not present

## 2020-07-22 DIAGNOSIS — M216X1 Other acquired deformities of right foot: Secondary | ICD-10-CM | POA: Diagnosis not present

## 2020-07-28 DIAGNOSIS — M792 Neuralgia and neuritis, unspecified: Secondary | ICD-10-CM | POA: Diagnosis not present

## 2020-08-03 DIAGNOSIS — G4733 Obstructive sleep apnea (adult) (pediatric): Secondary | ICD-10-CM | POA: Diagnosis not present

## 2020-08-05 ENCOUNTER — Other Ambulatory Visit (INDEPENDENT_AMBULATORY_CARE_PROVIDER_SITE_OTHER): Payer: Medicare HMO

## 2020-08-05 DIAGNOSIS — D649 Anemia, unspecified: Secondary | ICD-10-CM | POA: Diagnosis not present

## 2020-08-05 DIAGNOSIS — Z79891 Long term (current) use of opiate analgesic: Secondary | ICD-10-CM | POA: Diagnosis not present

## 2020-08-05 DIAGNOSIS — F429 Obsessive-compulsive disorder, unspecified: Secondary | ICD-10-CM | POA: Diagnosis not present

## 2020-08-05 DIAGNOSIS — F332 Major depressive disorder, recurrent severe without psychotic features: Secondary | ICD-10-CM | POA: Diagnosis not present

## 2020-08-05 LAB — CBC WITH DIFFERENTIAL/PLATELET
Basophils Absolute: 0.1 10*3/uL (ref 0.0–0.1)
Basophils Relative: 1.3 % (ref 0.0–3.0)
Eosinophils Absolute: 0.1 10*3/uL (ref 0.0–0.7)
Eosinophils Relative: 1.1 % (ref 0.0–5.0)
HCT: 36.8 % — ABNORMAL LOW (ref 39.0–52.0)
Hemoglobin: 12.6 g/dL — ABNORMAL LOW (ref 13.0–17.0)
Lymphocytes Relative: 29.8 % (ref 12.0–46.0)
Lymphs Abs: 1.7 10*3/uL (ref 0.7–4.0)
MCHC: 34.4 g/dL (ref 30.0–36.0)
MCV: 87.3 fl (ref 78.0–100.0)
Monocytes Absolute: 0.7 10*3/uL (ref 0.1–1.0)
Monocytes Relative: 12.3 % — ABNORMAL HIGH (ref 3.0–12.0)
Neutro Abs: 3.2 10*3/uL (ref 1.4–7.7)
Neutrophils Relative %: 55.5 % (ref 43.0–77.0)
Platelets: 240 10*3/uL (ref 150.0–400.0)
RBC: 4.21 Mil/uL — ABNORMAL LOW (ref 4.22–5.81)
RDW: 18.4 % — ABNORMAL HIGH (ref 11.5–15.5)
WBC: 5.8 10*3/uL (ref 4.0–10.5)

## 2020-08-06 ENCOUNTER — Other Ambulatory Visit: Payer: Self-pay

## 2020-08-06 ENCOUNTER — Other Ambulatory Visit: Payer: Self-pay | Admitting: Gastroenterology

## 2020-08-06 DIAGNOSIS — D649 Anemia, unspecified: Secondary | ICD-10-CM

## 2020-08-07 MED ORDER — FERROUS GLUCONATE 324 (38 FE) MG PO TABS
324.0000 mg | ORAL_TABLET | Freq: Every day | ORAL | 3 refills | Status: DC
Start: 2020-08-07 — End: 2021-05-12

## 2020-08-14 DIAGNOSIS — E7800 Pure hypercholesterolemia, unspecified: Secondary | ICD-10-CM | POA: Diagnosis not present

## 2020-08-14 DIAGNOSIS — I10 Essential (primary) hypertension: Secondary | ICD-10-CM | POA: Diagnosis not present

## 2020-08-14 DIAGNOSIS — K219 Gastro-esophageal reflux disease without esophagitis: Secondary | ICD-10-CM | POA: Diagnosis not present

## 2020-08-14 DIAGNOSIS — M19032 Primary osteoarthritis, left wrist: Secondary | ICD-10-CM | POA: Diagnosis not present

## 2020-08-14 DIAGNOSIS — D649 Anemia, unspecified: Secondary | ICD-10-CM | POA: Diagnosis not present

## 2020-08-14 DIAGNOSIS — G8918 Other acute postprocedural pain: Secondary | ICD-10-CM | POA: Diagnosis not present

## 2020-08-14 DIAGNOSIS — E7211 Homocystinuria: Secondary | ICD-10-CM | POA: Diagnosis not present

## 2020-08-14 DIAGNOSIS — G629 Polyneuropathy, unspecified: Secondary | ICD-10-CM | POA: Diagnosis not present

## 2020-08-14 DIAGNOSIS — M931 Kienbock's disease of adults: Secondary | ICD-10-CM | POA: Diagnosis not present

## 2020-08-14 DIAGNOSIS — M25532 Pain in left wrist: Secondary | ICD-10-CM | POA: Diagnosis not present

## 2020-08-14 DIAGNOSIS — M1710 Unilateral primary osteoarthritis, unspecified knee: Secondary | ICD-10-CM | POA: Diagnosis not present

## 2020-08-27 DIAGNOSIS — M25532 Pain in left wrist: Secondary | ICD-10-CM | POA: Diagnosis not present

## 2020-08-27 DIAGNOSIS — Z4889 Encounter for other specified surgical aftercare: Secondary | ICD-10-CM | POA: Diagnosis not present

## 2020-09-11 DIAGNOSIS — G629 Polyneuropathy, unspecified: Secondary | ICD-10-CM | POA: Diagnosis not present

## 2020-09-11 DIAGNOSIS — Z79891 Long term (current) use of opiate analgesic: Secondary | ICD-10-CM | POA: Diagnosis not present

## 2020-09-11 DIAGNOSIS — Z5181 Encounter for therapeutic drug level monitoring: Secondary | ICD-10-CM | POA: Diagnosis not present

## 2020-09-11 DIAGNOSIS — G894 Chronic pain syndrome: Secondary | ICD-10-CM | POA: Diagnosis not present

## 2020-09-11 DIAGNOSIS — Z79899 Other long term (current) drug therapy: Secondary | ICD-10-CM | POA: Diagnosis not present

## 2020-09-21 ENCOUNTER — Encounter: Payer: Self-pay | Admitting: Adult Health

## 2020-09-21 ENCOUNTER — Ambulatory Visit (INDEPENDENT_AMBULATORY_CARE_PROVIDER_SITE_OTHER): Payer: Medicare HMO | Admitting: Adult Health

## 2020-09-21 VITALS — BP 124/78 | HR 80 | Temp 98.5°F | Resp 16 | Ht 75.0 in | Wt 352.0 lb

## 2020-09-21 DIAGNOSIS — I10 Essential (primary) hypertension: Secondary | ICD-10-CM | POA: Diagnosis not present

## 2020-09-21 DIAGNOSIS — Z9989 Dependence on other enabling machines and devices: Secondary | ICD-10-CM

## 2020-09-21 DIAGNOSIS — G4733 Obstructive sleep apnea (adult) (pediatric): Secondary | ICD-10-CM

## 2020-09-21 DIAGNOSIS — F411 Generalized anxiety disorder: Secondary | ICD-10-CM | POA: Diagnosis not present

## 2020-09-21 DIAGNOSIS — K219 Gastro-esophageal reflux disease without esophagitis: Secondary | ICD-10-CM

## 2020-09-21 DIAGNOSIS — G629 Polyneuropathy, unspecified: Secondary | ICD-10-CM

## 2020-09-21 DIAGNOSIS — E7211 Homocystinuria: Secondary | ICD-10-CM

## 2020-09-21 NOTE — Progress Notes (Signed)
[facility] [place], [place] [address]  Internal MEDICINE  Office Visit Note  Patient Name: Jesus Crosby  [id number]  [id number]  Date of Service: 09/21/2020  Chief Complaint  Patient presents with  . Follow-up    foot exam  . Depression  . Anxiety  . Hypertension  . policy update form    Received    HPI  Pt is here for follow up on depression, anxiety, homocystinuria, and HTN.  He reports overall he is has been doing ok. His blood pressure is currently well controlled.  His depression and anxiety are currently also at baseline.  He has recently seen pain management due to his neuropathy, as well as his recent wrist fusion.  He was prescribed oxycodone. He remains in a hard cast on his left wrist due to the fusion.  He has follow up scheduled for next week.      Current Medication: Outpatient Encounter Medications as of 09/21/2020  Medication Sig Note  . Alpha-Lipoic Acid 600 MG CAPS Take 1 capsule (600 mg total) by mouth 2 (two) times daily.   Marland Kitchen ALPRAZolam (XANAX) 0.5 MG tablet Take 0.5 mg by mouth 4 (four) times daily. By dr reddy 03/25/2014: .   Marland Kitchen amLODipine (NORVASC) 5 MG tablet Take one tab po bid   . aspirin EC 81 MG tablet Take 81 mg by mouth at bedtime.    Marland Kitchen atorvastatin (LIPITOR) 10 MG tablet Take 1 tablet (10 mg total) by mouth at bedtime.   . Betaine (CYSTADANE) POWD Take 8 Scoops by mouth 3 (three) times daily.   . clotrimazole (LOTRIMIN) 1 % cream APPLY EXTERNALLY TO THE AFFECTED AREA TWICE DAILY   . cycloSPORINE (RESTASIS) 0.05 % ophthalmic emulsion Place 2 drops into both eyes 2 (two) times daily. Pt need 90 days ok to fill   . dicyclomine (BENTYL) 10 MG capsule TAKE 1 CAPSULE(10 MG) BY MOUTH FOUR TIMES DAILY BEFORE MEALS AND AT BEDTIME   . DULoxetine (CYMBALTA) 60 MG capsule Take 60 mg by mouth daily.   . ferrous gluconate (FERGON) 324 MG tablet Take 1 tablet (324 mg total) by mouth daily with breakfast.   . folic acid (FOLVITE) 737  MCG tablet Take 1,200 mcg by mouth at bedtime.    . furosemide (LASIX) 20 MG tablet TAKE 1 TABLET(20 MG) BY MOUTH DAILY   . gabapentin (NEURONTIN) 800 MG tablet Take 2 tablets by mouth 2 (two) times daily.    Marland Kitchen losartan (COZAAR) 50 MG tablet TAKE 1/2 TABLET(25 MG) BY MOUTH AT BEDTIME   . metoprolol tartrate (LOPRESSOR) 50 MG tablet TAKE 1 AND 1/2 TABLETS BY MOUTH TWICE DAILY   . NONFORMULARY OR COMPOUNDED ITEM See pharmacy note   . pantoprazole (PROTONIX) 40 MG tablet Take 1 tablet (40 mg total) by mouth daily.   Marland Kitchen pyridOXINE (VITAMIN B-6) 100 MG tablet Take 500 mg by mouth at bedtime.    . sertraline (ZOLOFT) 100 MG tablet Take 200 mg by mouth daily.    . Suvorexant (BELSOMRA) 20 MG TABS Take 20 mg by mouth at bedtime.   . vitamin B-12 (CYANOCOBALAMIN) 1000 MCG tablet Take 1,000 mcg by mouth at bedtime.   . Ferrous Sulfate Dried 45 MG TBCR Take 45 mg by mouth at bedtime.   . [DISCONTINUED] ferrous gluconate (FERGON) 324 MG tablet Take 1 tablet (324 mg total) by mouth daily with breakfast. (Patient not taking: Reported on 09/21/2020)   . [DISCONTINUED] polyethylene glycol powder (GLYCOLAX/MIRALAX)  17 GM/SCOOP powder Take 17 g by mouth as needed.    No facility-administered encounter medications on file as of 09/21/2020.    Surgical History: Past Surgical History:  Procedure Laterality Date  . COLONOSCOPY  2020  . COLONOSCOPY WITH PROPOFOL N/A 07/30/2015   Procedure: COLONOSCOPY WITH PROPOFOL;  Surgeon: [name], MD;  [facility];  Service: Endoscopy;  Laterality: N/A;  . DERMABRASION OF FACE     due to acne scars  . distal radius fracture of right hand (plates, screws, and pins)  2011  . ESOPHAGOGASTRODUODENOSCOPY (EGD) WITH PROPOFOL N/A 07/30/2015   Procedure: ESOPHAGOGASTRODUODENOSCOPY (EGD) WITH PROPOFOL;  Surgeon: [name], MD;  [facility];  Service: Endoscopy;  Laterality: N/A;  . EYE SURGERY     LASER + SURG BIL   . GAS/FLUID EXCHANGE Right  09/17/2013   Procedure: GAS/FLUID EXCHANGE;  Surgeon: Hayden Pedro, MD;  Location: [place];  Service: Ophthalmology;  Laterality: Right;  . gastric sleeve surgery  08/17/2016  . HIP ARTHROPLASTY Left   . INTRAOCULAR LENS INSERTION Bilateral    lens disease due to homocysteinuria  . JOINT REPLACEMENT Left    THR  . NASAL SEPTUM SURGERY  march 2016   @ [facility]  . PARS PLANA VITRECTOMY Right 09/17/2013   Procedure: PARS PLANA VITRECTOMY WITH 25G REMOVAL/SUTURE SECONDARY INTRAOCULAR LENS;  Surgeon: Hayden Pedro, MD;  Location: [place];  Service: Ophthalmology;  Laterality: Right;  . PHOTOCOAGULATION Right 09/17/2013   Procedure: PHOTOCOAGULATION;  Surgeon: Hayden Pedro, MD;  Location: [place];  Service: Ophthalmology;  Laterality: Right;  HEADSCOPE LASER  . PULSE GENERATOR IMPLANT N/A 02/13/2019   Procedure: UNILATERAL PULSE GENERATOR IMPLANT;  Surgeon: Meade Maw, MD;  Location: [facility];  Service: Neurosurgery;  Laterality: N/A;  . scapholunate ligament reconstruction of right hand  2011  . UMBILICAL HERNIA REPAIR N/A 05/07/2015   Procedure: HERNIA REPAIR UMBILICAL ADULT;  Surgeon: Florene Glen, MD;  Location: [facility];  Service: General;  Laterality: N/A;  . UPPER GASTROINTESTINAL ENDOSCOPY  2012  . WRIST FUSION Left   . WRIST SURGERY Right     Medical History: Past Medical History:  Diagnosis Date  . Anxiety   . Arthritis    knees,Right wrist  . Avascular necrosis ([place])   . Brain bleed ([place])   . Cataract    bilateral repair with lens implants  . Depression   . Family history of adverse reaction to anesthesia    n/v-mom  . GERD (gastroesophageal reflux disease)   . 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 Hypertension   . Idiopathic progressive polyneuropathy   . Lens disease   . Neuromuscular disorder ([place])   . Obesity, Class II, BMI 35-39.9   . OCD (obsessive compulsive disorder)   . Oxygen deficiency    2 liters  . Paresthesia 06/10/2015  . Sleep apnea    cpap  . Stroke  [facility])    with brain bleed at age 63-no deficits  . Umbilical hernia     Family History: Family History  Problem Relation Age of Onset  . Breast cancer Mother   . Hypertension Mother   . Colon polyps Mother   . Hypertension Father   . Colon polyps Father   . Stroke Maternal Grandmother   . Stroke Maternal Grandfather   . Stroke Paternal Grandmother   . Stroke Paternal Grandfather   . Stroke Other   . Colon cancer Neg Hx   . Esophageal cancer Neg  Hx   . Rectal cancer Neg Hx   . Stomach cancer Neg Hx     Social History   Socioeconomic History  . Marital status: Single    Spouse name: n/a  . Number of children: 0  . Years of education: college  . Highest education level: Not on file  Occupational History  . Occupation: unemployed/disabled    Comment: Lexicographer  Tobacco Use  . Smoking status: Never Smoker  . Smokeless tobacco: Never Used  Vaping Use  . Vaping Use: Never used  Substance and Sexual Activity  . Alcohol use: No    Alcohol/week: 0.0 standard drinks  . Drug use: No  . Sexual activity: Not on file  Other Topics Concern  . Not on file  Social History Narrative   Lives alone. Parents live nearby.  Applying for disability (OCD) due to wrist injury, hearing upcoming.      Patient drinks 4-5 cups of caffeine daily.   Patient is right handed.   Social Determinants of Health   Financial Resource Strain:   . Difficulty of Paying Living Expenses: Not on file  Food Insecurity:   . Worried About Charity fundraiser in the Last Year: Not on file  . Ran Out of Food in the Last Year: Not on file  Transportation Needs:   . Lack of Transportation (Medical): Not on file  . Lack of Transportation (Non-Medical): Not on file  Physical Activity:   . Days of Exercise per Week: Not on file  . Minutes of Exercise per Session: Not on file  Stress:   . Feeling of Stress : Not on file  Social Connections:   . Frequency of Communication with Friends and  Family: Not on file  . Frequency of Social Gatherings with Friends and Family: Not on file  . Attends Religious Services: Not on file  . Active Member of Clubs or Organizations: Not on file  . Attends Archivist Meetings: Not on file  . Marital Status: Not on file  Intimate Partner Violence:   . Fear of Current or Ex-Partner: Not on file  . Emotionally Abused: Not on file  . Physically Abused: Not on file  . Sexually Abused: Not on file      Review of Systems  Constitutional: Negative.  Negative for chills, fatigue and unexpected weight change.  HENT: Negative.  Negative for congestion, rhinorrhea, sneezing and sore throat.   Eyes: Negative for redness.  Respiratory: Negative.  Negative for cough, chest tightness and shortness of breath.   Cardiovascular: Negative.  Negative for chest pain and palpitations.  Gastrointestinal: Negative.  Negative for abdominal pain, constipation, diarrhea, nausea and vomiting.  Endocrine: Negative.   Genitourinary: Negative.  Negative for dysuria and frequency.  Musculoskeletal: Negative.  Negative for arthralgias, back pain, joint swelling and neck pain.  Skin: Negative.  Negative for rash.  Allergic/Immunologic: Negative.   Neurological: Negative.  Negative for tremors and numbness.  Hematological: Negative for adenopathy. Does not bruise/bleed easily.  Psychiatric/Behavioral: Negative.  Negative for behavioral problems, sleep disturbance and suicidal ideas. The patient is not nervous/anxious.     Vital Signs: BP 124/78   Pulse 80   Temp 98.5 F (36.9 C)   Resp 16   Ht 6\' 3"  (1.905 m)   Wt (!) 352 lb (159.7 kg)   SpO2 97%   BMI 44.00 kg/m    Physical Exam Vitals and nursing note reviewed.  Constitutional:      General: He  is not in acute distress.    Appearance: He is well-developed. He is not diaphoretic.  HENT:     Head: Normocephalic and atraumatic.     Mouth/Throat:     Pharynx: No oropharyngeal exudate.  Eyes:      Pupils: Pupils are equal, round, and reactive to light.  Neck:     Thyroid: No thyromegaly.     Vascular: No JVD.     Trachea: No tracheal deviation.  Cardiovascular:     Rate and Rhythm: Normal rate and regular rhythm.     Heart sounds: Normal heart sounds. No murmur heard.  No friction rub. No gallop.   Pulmonary:     Effort: Pulmonary effort is normal. No respiratory distress.     Breath sounds: Normal breath sounds. No wheezing or rales.  Chest:     Chest wall: No tenderness.  Abdominal:     Palpations: Abdomen is soft.     Tenderness: There is no abdominal tenderness. There is no guarding.  Musculoskeletal:        General: Normal range of motion.     Cervical back: Normal range of motion and neck supple.  Lymphadenopathy:     Cervical: No cervical adenopathy.  Skin:    General: Skin is warm and dry.  Neurological:     Mental Status: He is alert and oriented to person, place, and time.     Cranial Nerves: No cranial nerve deficit.     Comments: Neuropathic pain bilateral feet.  Psychiatric:        Behavior: Behavior normal.        Thought Content: Thought content normal.        Judgment: Judgment normal.    Assessment/Plan: 1. Essential hypertension, benign Continue current medications.  Well controlled at this time.   2. GAD (generalized anxiety disorder) Stable, continue with cymbalta.   3. Neuropathy Continue to see pain management as discussed.   4. 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  5. Gastroesophageal reflux disease without esophagitis Continue protonix as directed.   6. OSA on CPAP Patient machine is recalled, he is in the process of getting a new machine.   General Counseling: macintyre alexa understanding of the findings of todays visit and agrees with plan of treatment. I have discussed any further diagnostic evaluation that may be needed or ordered today. We also reviewed his medications today. he has been encouraged to call the  office with any questions or concerns that should arise related to todays visit.    No orders of the defined types were placed in this encounter.   No orders of the defined types were placed in this encounter.   Time spent: 30 Minutes   This patient was seen by Orson Gear AGNP-C in Collaboration with Dr [name] as a part of collaborative care agreement     Kendell Bane AGNP-C Internal medicine

## 2020-09-28 ENCOUNTER — Ambulatory Visit: Payer: Medicare HMO | Admitting: Student in an Organized Health Care Education/Training Program

## 2020-09-29 DIAGNOSIS — M1812 Unilateral primary osteoarthritis of first carpometacarpal joint, left hand: Secondary | ICD-10-CM | POA: Diagnosis not present

## 2020-10-01 DIAGNOSIS — M1812 Unilateral primary osteoarthritis of first carpometacarpal joint, left hand: Secondary | ICD-10-CM | POA: Diagnosis not present

## 2020-10-06 ENCOUNTER — Other Ambulatory Visit: Payer: Self-pay

## 2020-10-06 DIAGNOSIS — H04123 Dry eye syndrome of bilateral lacrimal glands: Secondary | ICD-10-CM

## 2020-10-06 MED ORDER — CYCLOSPORINE 0.05 % OP EMUL: 2.0000 [drp] | Freq: Two times a day (BID) | OPHTHALMIC | 0 refills | Status: DC

## 2020-10-07 ENCOUNTER — Other Ambulatory Visit: Payer: Self-pay

## 2020-10-07 DIAGNOSIS — H04123 Dry eye syndrome of bilateral lacrimal glands: Secondary | ICD-10-CM

## 2020-10-07 MED ORDER — CYCLOSPORINE 0.05 % OP EMUL: 2.0000 [drp] | Freq: Two times a day (BID) | OPHTHALMIC | 0 refills | Status: AC

## 2020-10-09 DIAGNOSIS — G894 Chronic pain syndrome: Secondary | ICD-10-CM | POA: Diagnosis not present

## 2020-10-09 DIAGNOSIS — G629 Polyneuropathy, unspecified: Secondary | ICD-10-CM | POA: Diagnosis not present

## 2020-10-09 DIAGNOSIS — G9050 Complex regional pain syndrome I, unspecified: Secondary | ICD-10-CM | POA: Diagnosis not present

## 2020-10-09 DIAGNOSIS — Z79899 Other long term (current) drug therapy: Secondary | ICD-10-CM | POA: Diagnosis not present

## 2020-10-13 ENCOUNTER — Ambulatory Visit: Payer: Medicare HMO | Admitting: Internal Medicine

## 2020-10-20 ENCOUNTER — Telehealth: Payer: Self-pay

## 2020-10-20 NOTE — Telephone Encounter (Signed)
He is having a burning sensation with his SCS. He wants to know what to do about it.

## 2020-10-20 NOTE — Telephone Encounter (Signed)
Left voicemail with patient re; his call about SCS.

## 2020-10-26 DIAGNOSIS — M170 Bilateral primary osteoarthritis of knee: Secondary | ICD-10-CM | POA: Diagnosis not present

## 2020-10-27 ENCOUNTER — Telehealth: Payer: Self-pay

## 2020-10-27 NOTE — Telephone Encounter (Signed)
Patient needs to be seen in office as a pulmonary appointment in order to get cpap supplies and or a machine, his last visit was pcp and he needs a pulmonary visit. Jesus Crosby

## 2020-10-30 ENCOUNTER — Other Ambulatory Visit: Payer: Self-pay | Admitting: Physician Assistant

## 2020-10-30 DIAGNOSIS — K5900 Constipation, unspecified: Secondary | ICD-10-CM

## 2020-10-30 DIAGNOSIS — K602 Anal fissure, unspecified: Secondary | ICD-10-CM

## 2020-10-30 DIAGNOSIS — R109 Unspecified abdominal pain: Secondary | ICD-10-CM

## 2020-10-30 DIAGNOSIS — K625 Hemorrhage of anus and rectum: Secondary | ICD-10-CM

## 2020-11-02 DIAGNOSIS — G4733 Obstructive sleep apnea (adult) (pediatric): Secondary | ICD-10-CM | POA: Diagnosis not present

## 2020-11-05 ENCOUNTER — Other Ambulatory Visit: Payer: Self-pay

## 2020-11-05 DIAGNOSIS — I10 Essential (primary) hypertension: Secondary | ICD-10-CM

## 2020-11-05 MED ORDER — AMLODIPINE BESYLATE 5 MG PO TABS: ORAL_TABLET | ORAL | 1 refills | Status: DC

## 2020-11-09 ENCOUNTER — Other Ambulatory Visit (INDEPENDENT_AMBULATORY_CARE_PROVIDER_SITE_OTHER): Payer: Medicare HMO

## 2020-11-09 DIAGNOSIS — D649 Anemia, unspecified: Secondary | ICD-10-CM

## 2020-11-09 LAB — CBC WITH DIFFERENTIAL/PLATELET
Basophils Absolute: 0.1 10*3/uL (ref 0.0–0.1)
Basophils Relative: 1.5 % (ref 0.0–3.0)
Eosinophils Absolute: 0.3 10*3/uL (ref 0.0–0.7)
Eosinophils Relative: 3.5 % (ref 0.0–5.0)
HCT: 39.1 % (ref 39.0–52.0)
Hemoglobin: 13.6 g/dL (ref 13.0–17.0)
Lymphocytes Relative: 26.3 % (ref 12.0–46.0)
Lymphs Abs: 2.2 10*3/uL (ref 0.7–4.0)
MCHC: 34.9 g/dL (ref 30.0–36.0)
MCV: 88.5 fl (ref 78.0–100.0)
Monocytes Absolute: 1 10*3/uL (ref 0.1–1.0)
Monocytes Relative: 12 % (ref 3.0–12.0)
Neutro Abs: 4.7 10*3/uL (ref 1.4–7.7)
Neutrophils Relative %: 56.7 % (ref 43.0–77.0)
Platelets: 258 10*3/uL (ref 150.0–400.0)
RBC: 4.41 Mil/uL (ref 4.22–5.81)
RDW: 14.8 % (ref 11.5–15.5)
WBC: 8.3 10*3/uL (ref 4.0–10.5)

## 2020-11-10 DIAGNOSIS — M19031 Primary osteoarthritis, right wrist: Secondary | ICD-10-CM | POA: Diagnosis not present

## 2020-11-10 DIAGNOSIS — M19032 Primary osteoarthritis, left wrist: Secondary | ICD-10-CM | POA: Diagnosis not present

## 2020-11-11 ENCOUNTER — Other Ambulatory Visit: Payer: Self-pay

## 2020-11-11 DIAGNOSIS — D649 Anemia, unspecified: Secondary | ICD-10-CM

## 2020-11-16 DIAGNOSIS — M216X2 Other acquired deformities of left foot: Secondary | ICD-10-CM | POA: Diagnosis not present

## 2020-11-16 DIAGNOSIS — M792 Neuralgia and neuritis, unspecified: Secondary | ICD-10-CM | POA: Diagnosis not present

## 2020-11-16 DIAGNOSIS — M722 Plantar fascial fibromatosis: Secondary | ICD-10-CM | POA: Diagnosis not present

## 2020-11-16 DIAGNOSIS — R252 Cramp and spasm: Secondary | ICD-10-CM | POA: Diagnosis not present

## 2020-11-16 DIAGNOSIS — E7211 Homocystinuria: Secondary | ICD-10-CM | POA: Diagnosis not present

## 2020-11-16 DIAGNOSIS — M216X1 Other acquired deformities of right foot: Secondary | ICD-10-CM | POA: Diagnosis not present

## 2020-11-19 ENCOUNTER — Encounter: Payer: Self-pay | Admitting: Internal Medicine

## 2020-11-19 ENCOUNTER — Other Ambulatory Visit: Payer: Self-pay

## 2020-11-19 ENCOUNTER — Ambulatory Visit: Payer: Medicare HMO | Admitting: Internal Medicine

## 2020-11-19 VITALS — BP 140/88 | HR 75 | Temp 98.8°F | Resp 16 | Ht 75.0 in | Wt 354.0 lb

## 2020-11-19 DIAGNOSIS — G4733 Obstructive sleep apnea (adult) (pediatric): Secondary | ICD-10-CM

## 2020-11-19 DIAGNOSIS — E7211 Homocystinuria: Secondary | ICD-10-CM

## 2020-11-19 DIAGNOSIS — G894 Chronic pain syndrome: Secondary | ICD-10-CM | POA: Diagnosis not present

## 2020-11-19 NOTE — Progress Notes (Signed)
[facility] [place], [place] [address]  Pulmonary Sleep Medicine   Office Visit Note  Patient Name: Jesus Crosby. DOB: 1966/09/28 MRN [id number]  Date of Service: 11/19/2020  Complaints/HPI: OSA patient has a respiraonics machine at this time. The machine is under recall. He thinks it is not working well either. I was able to retrieve his old sleep study from 2015 which shows he has severe OSA. He also has gained weight since his spinal surgery and pain control. He is also on oxycodone. Patient may have increased central apneas too. He is also on xanax. In addition to this he does take gabapentin for neuropathy. He feels tired not as bad as before. He does not sleep well. He tries to use the mask even during the day.  ROS  General: (-) fever, (-) chills, (-) night sweats, (-) weakness Skin: (-) rashes, (-) itching,. Eyes: (-) visual changes, (-) redness, (-) itching. Nose and Sinuses: (-) nasal stuffiness or itchiness, (-) postnasal drip, (-) nosebleeds, (-) sinus trouble. Mouth and Throat: (-) sore throat, (-) hoarseness. Neck: (-) swollen glands, (-) enlarged thyroid, (-) neck pain. Respiratory: - cough, (-) bloody sputum, - shortness of breath, - wheezing. Cardiovascular: - ankle swelling, (-) chest pain. Lymphatic: (-) lymph node enlargement. Neurologic: (-) numbness, (-) tingling. Psychiatric: (-) anxiety, (-) depression   Current Medication: Outpatient Encounter Medications as of 11/19/2020  Medication Sig Note  . ALPRAZolam (XANAX) 0.5 MG tablet Take 0.5 mg by mouth 4 (four) times daily. By dr reddy 03/25/2014: .   Marland Kitchen amLODipine (NORVASC) 5 MG tablet Take one tab po bid   . aspirin EC 81 MG tablet Take 81 mg by mouth at bedtime.    Marland Kitchen atorvastatin (LIPITOR) 10 MG tablet Take 1 tablet (10 mg total) by mouth at bedtime.   . Betaine (CYSTADANE) POWD Take 8 Scoops by mouth 3 (three) times daily.   . clotrimazole (LOTRIMIN) 1 % cream APPLY EXTERNALLY  TO THE AFFECTED AREA TWICE DAILY   . cycloSPORINE (RESTASIS) 0.05 % ophthalmic emulsion Place 2 drops into both eyes 2 (two) times daily. Pt need 90 days ok to fill   . dicyclomine (BENTYL) 10 MG capsule TAKE 1 CAPSULE(10 MG) BY MOUTH FOUR TIMES DAILY BEFORE MEALS AND AT BEDTIME   . DULoxetine (CYMBALTA) 60 MG capsule Take 60 mg by mouth daily.   . folic acid (FOLVITE) 903 MCG tablet Take 1,200 mcg by mouth at bedtime.    . furosemide (LASIX) 20 MG tablet TAKE 1 TABLET(20 MG) BY MOUTH DAILY   . gabapentin (NEURONTIN) 800 MG tablet Take 2 tablets by mouth 2 (two) times daily.    Marland Kitchen losartan (COZAAR) 50 MG tablet TAKE 1/2 TABLET(25 MG) BY MOUTH AT BEDTIME   . metoprolol tartrate (LOPRESSOR) 50 MG tablet TAKE 1 AND 1/2 TABLETS BY MOUTH TWICE DAILY   . NONFORMULARY OR COMPOUNDED ITEM See pharmacy note   . pantoprazole (PROTONIX) 40 MG tablet Take 1 tablet (40 mg total) by mouth daily.   Marland Kitchen pyridOXINE (VITAMIN B-6) 100 MG tablet Take 500 mg by mouth at bedtime.    . sertraline (ZOLOFT) 100 MG tablet Take 200 mg by mouth daily.    . Suvorexant 20 MG TABS Take 20 mg by mouth at bedtime.   . vitamin B-12 (CYANOCOBALAMIN) 1000 MCG tablet Take 1,000 mcg by mouth at bedtime.   . ferrous gluconate (FERGON) 324 MG tablet Take 1 tablet (324 mg total) by mouth daily with  breakfast.   . Ferrous Sulfate Dried 45 MG TBCR Take 45 mg by mouth at bedtime.   Marland Kitchen oxyCODONE (OXY IR/ROXICODONE) 5 MG immediate release tablet oxycodone 5 mg tablet  TAKE 1 TABLET BY MOUTH EVERY 6 HOURS AS NEEDED FOR PAIN    No facility-administered encounter medications on file as of 11/19/2020.    Surgical History: Past Surgical History:  Procedure Laterality Date  . COLONOSCOPY  2020  . COLONOSCOPY WITH PROPOFOL N/A 07/30/2015   Procedure: COLONOSCOPY WITH PROPOFOL;  Surgeon: [name], MD;  Location: [facility];  Service: Endoscopy;  Laterality: N/A;  . DERMABRASION OF FACE     due to acne scars  . distal radius fracture of  right hand (plates, screws, and pins)  2011  . ESOPHAGOGASTRODUODENOSCOPY (EGD) WITH PROPOFOL N/A 07/30/2015   Procedure: ESOPHAGOGASTRODUODENOSCOPY (EGD) WITH PROPOFOL;  Surgeon: [name], MD;  Location: [facility];  Service: Endoscopy;  Laterality: N/A;  . EYE SURGERY     LASER + SURG BIL   . GAS/FLUID EXCHANGE Right 09/17/2013   Procedure: GAS/FLUID EXCHANGE;  Surgeon: Hayden Pedro, MD;  Location: [facility];  Service: Ophthalmology;  Laterality: Right;  . gastric sleeve surgery  08/17/2016  . HIP ARTHROPLASTY Left   . INTRAOCULAR LENS INSERTION Bilateral    lens disease due to homocysteinuria  . JOINT REPLACEMENT Left    THR  . NASAL SEPTUM SURGERY  march 2016   @ [facility]  . PARS PLANA VITRECTOMY Right 09/17/2013   Procedure: PARS PLANA VITRECTOMY WITH 25G REMOVAL/SUTURE SECONDARY INTRAOCULAR LENS;  Surgeon: Hayden Pedro, MD;  Location: [facility];  Service: Ophthalmology;  Laterality: Right;  . PHOTOCOAGULATION Right 09/17/2013   Procedure: PHOTOCOAGULATION;  Surgeon: Hayden Pedro, MD;  Location: [facility];  Service: Ophthalmology;  Laterality: Right;  HEADSCOPE LASER  . PULSE GENERATOR IMPLANT N/A 02/13/2019   Procedure: UNILATERAL PULSE GENERATOR IMPLANT;  Surgeon: Meade Maw, MD;  Location: [facility];  Service: Neurosurgery;  Laterality: N/A;  . scapholunate ligament reconstruction of right hand  2011  . UMBILICAL HERNIA REPAIR N/A 05/07/2015   Procedure: HERNIA REPAIR UMBILICAL ADULT;  Surgeon: Florene Glen, MD;  Location: [facility];  Service: General;  Laterality: N/A;  . UPPER GASTROINTESTINAL ENDOSCOPY  2012  . WRIST FUSION Left   . WRIST SURGERY Right     Medical History: Past Medical History:  Diagnosis Date  . Anxiety   . Arthritis    knees,Right wrist  . Avascular necrosis ([place])   . Brain bleed ([place])   . Cataract    bilateral repair with lens implants  . Depression   . Family history of adverse reaction to anesthesia    n/v-mom  . GERD (gastroesophageal  reflux disease)   . 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 Hypertension   . Idiopathic progressive polyneuropathy   . Lens disease   . Neuromuscular disorder ([place])   . Obesity, Class II, BMI 35-39.9   . OCD (obsessive compulsive disorder)   . Oxygen deficiency    2 liters  . Paresthesia 06/10/2015  . Sleep apnea    cpap  . Stroke [facility])    with brain bleed at age 28-no deficits  . Umbilical hernia     Family History: Family History  Problem Relation Age of Onset  . Breast cancer Mother   . Hypertension Mother   . Colon polyps Mother   . Hypertension Father   . Colon polyps Father   . Stroke Maternal Grandmother   .  Stroke Maternal Grandfather   . Stroke Paternal Grandmother   . Stroke Paternal Grandfather   . Stroke Other   . Colon cancer Neg Hx   . Esophageal cancer Neg Hx   . Rectal cancer Neg Hx   . Stomach cancer Neg Hx     Social History: Social History   Socioeconomic History  . Marital status: Single    Spouse name: n/a  . Number of children: 0  . Years of education: college  . Highest education level: Not on file  Occupational History  . Occupation: unemployed/disabled    Comment: Lexicographer  Tobacco Use  . Smoking status: Never Smoker  . Smokeless tobacco: Never Used  Vaping Use  . Vaping Use: Never used  Substance and Sexual Activity  . Alcohol use: No    Alcohol/week: 0.0 standard drinks  . Drug use: No  . Sexual activity: Not on file  Other Topics Concern  . Not on file  Social History Narrative   Lives alone. Parents live nearby.  Applying for disability (OCD) due to wrist injury, hearing upcoming.      Patient drinks 4-5 cups of caffeine daily.   Patient is right handed.   Social Determinants of Health   Financial Resource Strain: Not on file  Food Insecurity: Not on file  Transportation Needs: Not on file  Physical Activity: Not on file  Stress: Not on file  Social Connections: Not on file  Intimate Partner Violence: Not on file     Vital Signs: Blood pressure 140/88, pulse 75, temperature 98.8 F (37.1 C), resp. rate 16, height 6\' 3"  (1.905 m), weight (!) 354 lb (160.6 kg), SpO2 95 %.  Examination: General Appearance: The patient is well-developed, well-nourished, and in no distress. Skin: Gross inspection of skin unremarkable. Head: normocephalic, no gross deformities. Eyes: no gross deformities noted. ENT: ears appear grossly normal no exudates. Neck: Supple. No thyromegaly. No LAD. Respiratory: no rhonchi noted. Cardiovascular: Normal S1 and S2 without murmur or rub. Extremities: No cyanosis. pulses are equal. Neurologic: Alert and oriented. No involuntary movements.  LABS: Recent Results (from the past 2160 hour(s))  CBC with Differential/Platelet     Status: None   Collection Time: 11/09/20 11:54 AM  Result Value Ref Range   WBC 8.3 4.0 - 10.5 K/uL   RBC 4.41 4.22 - 5.81 Mil/uL   Hemoglobin 13.6 13.0 - 17.0 g/dL   HCT 39.1 39.0 - 52.0 %   MCV 88.5 78.0 - 100.0 fl   MCHC 34.9 30.0 - 36.0 g/dL   RDW 14.8 11.5 - 15.5 %   Platelets 258.0 150.0 - 400.0 K/uL   Neutrophils Relative % 56.7 43.0 - 77.0 %   Lymphocytes Relative 26.3 12.0 - 46.0 %   Monocytes Relative 12.0 3.0 - 12.0 %   Eosinophils Relative 3.5 0.0 - 5.0 %   Basophils Relative 1.5 0.0 - 3.0 %   Neutro Abs 4.7 1.4 - 7.7 K/uL   Lymphs Abs 2.2 0.7 - 4.0 K/uL   Monocytes Absolute 1.0 0.1 - 1.0 K/uL   Eosinophils Absolute 0.3 0.0 - 0.7 K/uL   Basophils Absolute 0.1 0.0 - 0.1 K/uL    Radiology: MR THORACIC SPINE WO CONTRAST  Result Date: 02/02/2019 CLINICAL DATA:  Initial evaluation for chronic pain syndrome, evaluate for stenosis. EXAM: MRI THORACIC SPINE WITHOUT CONTRAST TECHNIQUE: Multiplanar, multisequence MR imaging of the thoracic spine was performed. No intravenous contrast was administered. COMPARISON:  None available. FINDINGS: Alignment: Mild dextroscoliosis. Alignment otherwise  normal with preservation of the normal thoracic  kyphosis. No listhesis. Vertebrae: Mild height loss at the superior endplate of T4, chronic in appearance. Associated small endplate Schmorl's node. Vertebral body heights otherwise relatively well maintained without evidence for acute or chronic fracture. Bone marrow signal intensity within normal limits. No discrete or worrisome osseous lesions. No abnormal marrow edema. Cord: Signal intensity within the thoracic spinal cord is normal. Normal cord caliber and morphology. Conus medullaris terminates inferior to the T12 level. Paraspinal and other soft tissues: Paraspinous soft tissues within normal limits. Visualized visceral structures unremarkable. Partially visualized lungs are grossly clear. Disc levels: T1-2:  Unremarkable. T2-3: Unremarkable. T3-4:  Minimal disc bulge.  No stenosis. T4-5:  Unremarkable. T5-6: Mild disc bulge with superimposed tiny right paracentral disc protrusion (series 22, image 17). Mild posterior element hypertrophy. No stenosis. T6-7:  Mild posterior element hypertrophy.  No stenosis. T7-8:  Minimal disc bulge.  Mild facet hypertrophy.  No stenosis. T8-9:  Minimal facet hypertrophy.  No stenosis. T9-10: Mild disc bulge. Bilateral facet hypertrophy. No significant stenosis. T10-11: Posterior element hypertrophy. Minimal disc bulge. No significant stenosis. T11-12: Mild disc bulge. Bilateral facet hypertrophy. No significant stenosis. T12-L1:  Unremarkable. IMPRESSION: 1. Minor multilevel degenerative disc bulging as detailed above without significant spinal stenosis or cord deformity. No impingement. 2. Mild multilevel facet hypertrophy as above, most pronounced at T10-11 and T11-12. Finding could contribute to underlying back pain. Electronically Signed   By: Jeannine Boga M.D.   On: 02/02/2019 07:16    No results found.  No results found.    Assessment and Plan: Patient Active Problem List   Diagnosis Date Noted  . Spinal cord stimulator status 01/08/2020  . Complex  regional pain syndrome type 2 of both lower extremities 01/03/2019  . Neuropathic pain of both legs 01/03/2019  . Aphasia 10/04/2018  . Arthritis 04/26/2018  . Arthritis of knee 04/26/2018  . Bilateral cataracts 04/26/2018  . Class 1 obesity 04/26/2018  . Disorder of lens 04/26/2018  . Exomphalos 04/26/2018  . OSA on CPAP 04/26/2018  . Prediabetes 04/26/2018  . History of total hip arthroplasty 07/05/2017  . Spinal stenosis of lumbar region 04/25/2017  . Trochanteric bursitis 02/27/2017  . S/P laparoscopic sleeve gastrectomy 08/17/2016  . Alcohol-induced mood disorder ([place])   . OCD (obsessive compulsive disorder)   . Paresthesia 06/10/2015  . Diplopia 08/15/2014  . Sinus tachycardia 08/15/2014  . CVA (cerebral infarction) 08/14/2014  . Neuropathy 04/04/2014  . Intraocular lens dislocation 09/12/2013  . DDD (degenerative disc disease), lumbosacral 07/04/2013  . HTN (hypertension) 07/04/2013  . Major depressive disorder, single episode 07/03/2009  . 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 HYPERLIPIDEMIA 05/28/2008  . ANXIETY 05/28/2008  . OBSESSIVE-COMPULSIVE DISORDER 05/28/2008    1. OSA concern for central sleep apnea with being on narcotics. He had central apneas and mixed apneas in addition to obstructive apneas noted. I am concerned if this has worsened with medication specifically narcotics and also weight gain 2. Obesity will need to work on weight loss 3. Chronic pain syndrome pain management 4. CVA history of stroke seems to be doing better  General Counseling: I have discussed the findings of the evaluation and examination with Glendell Docker.  I have also discussed any further diagnostic evaluation thatmay be needed or ordered today. Chrisean verbalizes understanding of the findings of todays visit. We also reviewed his medications today and discussed drug interactions and side effects including but not limited excessive drowsiness and altered mental  states. We also discussed that there is always a risk not just to him but also people around him. he has been encouraged to call the office with any questions or concerns that should arise related to todays visit.  Orders Placed This Encounter  Procedures  . PSG Sleep Study    Standing Status:   Future    Standing Expiration Date:   11/19/2021    Order Specific Question:   Where should this test be performed:    Answer:   [facility]     Time spent: 85  I have personally obtained a history, examined the patient, evaluated laboratory and imaging results, formulated the assessment and plan and placed orders.    [name], MD [facility] Pulmonary and Critical Care Sleep medicine

## 2020-11-19 NOTE — Patient Instructions (Signed)

## 2020-11-24 ENCOUNTER — Telehealth: Payer: Self-pay

## 2020-11-24 NOTE — Telephone Encounter (Signed)
All documents for [facility] signed and placed in folder behind Tat ready for pick up

## 2020-12-10 ENCOUNTER — Telehealth: Payer: Self-pay

## 2020-12-10 NOTE — Telephone Encounter (Signed)
Patient called and wanted status of sleep study, he has been advised to contact feeling great and I will also follow up with status of order. Jesus Crosby

## 2020-12-11 ENCOUNTER — Other Ambulatory Visit (HOSPITAL_COMMUNITY): Payer: Self-pay | Admitting: Orthopaedic Surgery

## 2020-12-11 ENCOUNTER — Other Ambulatory Visit: Payer: Self-pay | Admitting: Orthopaedic Surgery

## 2020-12-11 DIAGNOSIS — M19032 Primary osteoarthritis, left wrist: Secondary | ICD-10-CM

## 2020-12-29 ENCOUNTER — Ambulatory Visit: Payer: Medicare HMO | Admitting: Internal Medicine

## 2021-01-01 ENCOUNTER — Other Ambulatory Visit: Payer: Self-pay | Admitting: Internal Medicine

## 2021-01-01 DIAGNOSIS — H04123 Dry eye syndrome of bilateral lacrimal glands: Secondary | ICD-10-CM

## 2021-01-04 ENCOUNTER — Ambulatory Visit: Payer: Medicare HMO | Admitting: Physician Assistant

## 2021-01-05 ENCOUNTER — Ambulatory Visit (INDEPENDENT_AMBULATORY_CARE_PROVIDER_SITE_OTHER): Payer: Medicare HMO | Admitting: Hospice and Palliative Medicine

## 2021-01-05 ENCOUNTER — Encounter: Payer: Self-pay | Admitting: Hospice and Palliative Medicine

## 2021-01-05 VITALS — BP 139/90 | Temp 97.9°F | Resp 16 | Ht 75.0 in

## 2021-01-05 DIAGNOSIS — J029 Acute pharyngitis, unspecified: Secondary | ICD-10-CM | POA: Diagnosis not present

## 2021-01-05 DIAGNOSIS — J0140 Acute pansinusitis, unspecified: Secondary | ICD-10-CM

## 2021-01-05 MED ORDER — LIDOCAINE VISCOUS HCL 2 % MT SOLN: 15.0000 mL | OROMUCOSAL | 0 refills | Status: DC | PRN

## 2021-01-05 MED ORDER — AZITHROMYCIN 250 MG PO TABS: ORAL_TABLET | ORAL | 0 refills | Status: DC

## 2021-01-05 NOTE — Progress Notes (Signed)
[employer] [place], [place] [address]  Internal MEDICINE  Telephone Visit  Patient Name: Jesus Crosby  [id number]  [id number]  Date of Service: 01/07/2021  I connected with the patient at 1152 by telephone and verified the patients identity using two identifiers.   I discussed the limitations, risks, security and privacy concerns of performing an evaluation and management service by telephone and the availability of in person appointments. I also discussed with the patient that there may be a patient responsible charge related to the service.  The patient expressed understanding and agrees to proceed.    Chief Complaint  Patient presents with  . telephone assesment  . Telephone Screen  . Cough  . Sore Throat    HPI Patient being seen via video for acute sick visit C/o sore throat, nasal congestion, chest congestion and coughing Denies shortness of breath, wheezing, fevers or body aches Fully vaccinated for COVID--no close contact with anyone confirmed of having COVID Symptoms started about 4 days ago and are not improving Sore throat and coughing are most concerning symptoms  Current Medication: Outpatient Encounter Medications as of 01/05/2021  Medication Sig Note  . azithromycin (ZITHROMAX Z-PAK) 250 MG tablet Take two 250 mg tablets on day one followed by one 250 mg tablet each day for four days.   Marland Kitchen lidocaine (XYLOCAINE) 2 % solution Use as directed 15 mLs in the mouth or throat as needed for mouth pain.   Marland Kitchen ALPRAZolam (XANAX) 0.5 MG tablet Take 0.5 mg by mouth 4 (four) times daily. By dr reddy 03/25/2014: .   Marland Kitchen amLODipine (NORVASC) 5 MG tablet Take one tab po bid   . aspirin EC 81 MG tablet Take 81 mg by mouth at bedtime.    Marland Kitchen atorvastatin (LIPITOR) 10 MG tablet Take 1 tablet (10 mg total) by mouth at bedtime.   . Betaine (CYSTADANE) POWD Take 8 Scoops by mouth 3 (three) times daily.   . clotrimazole (LOTRIMIN) 1 % cream APPLY EXTERNALLY TO THE  AFFECTED AREA TWICE DAILY   . [EXPIRED] cycloSPORINE (RESTASIS) 0.05 % ophthalmic emulsion Place 2 drops into both eyes 2 (two) times daily. Pt need 90 days ok to fill   . dicyclomine (BENTYL) 10 MG capsule TAKE 1 CAPSULE(10 MG) BY MOUTH FOUR TIMES DAILY BEFORE MEALS AND AT BEDTIME   . DULoxetine (CYMBALTA) 60 MG capsule Take 60 mg by mouth daily.   . ferrous gluconate (FERGON) 324 MG tablet Take 1 tablet (324 mg total) by mouth daily with breakfast.   . Ferrous Sulfate Dried 45 MG TBCR Take 45 mg by mouth at bedtime.   . folic acid (FOLVITE) A999333 MCG tablet Take 1,200 mcg by mouth at bedtime.    . furosemide (LASIX) 20 MG tablet TAKE 1 TABLET(20 MG) BY MOUTH DAILY   . gabapentin (NEURONTIN) 800 MG tablet Take 2 tablets by mouth 2 (two) times daily.    Marland Kitchen losartan (COZAAR) 50 MG tablet TAKE 1/2 TABLET(25 MG) BY MOUTH AT BEDTIME   . metoprolol tartrate (LOPRESSOR) 50 MG tablet TAKE 1 AND 1/2 TABLETS BY MOUTH TWICE DAILY   . NONFORMULARY OR COMPOUNDED ITEM See pharmacy note   . oxyCODONE (OXY IR/ROXICODONE) 5 MG immediate release tablet oxycodone 5 mg tablet  TAKE 1 TABLET BY MOUTH EVERY 6 HOURS AS NEEDED FOR PAIN   . pantoprazole (PROTONIX) 40 MG tablet Take 1 tablet (40 mg total) by mouth daily.   Marland Kitchen pyridOXINE (VITAMIN B-6) 100 MG tablet  Take 500 mg by mouth at bedtime.    . sertraline (ZOLOFT) 100 MG tablet Take 200 mg by mouth daily.    . Suvorexant 20 MG TABS Take 20 mg by mouth at bedtime.   . vitamin B-12 (CYANOCOBALAMIN) 1000 MCG tablet Take 1,000 mcg by mouth at bedtime.    No facility-administered encounter medications on file as of 01/05/2021.    Surgical History: Past Surgical History:  Procedure Laterality Date  . COLONOSCOPY  2020  . COLONOSCOPY WITH PROPOFOL N/A 07/30/2015   Procedure: COLONOSCOPY WITH PROPOFOL;  Surgeon: [name], MD;  Location: [facility];  Service: Endoscopy;  Laterality: N/A;  . DERMABRASION OF FACE     due to acne scars  . distal radius fracture  of right hand (plates, screws, and pins)  2011  . ESOPHAGOGASTRODUODENOSCOPY (EGD) WITH PROPOFOL N/A 07/30/2015   Procedure: ESOPHAGOGASTRODUODENOSCOPY (EGD) WITH PROPOFOL;  Surgeon: [name], MD;  Location: [facility];  Service: Endoscopy;  Laterality: N/A;  . EYE SURGERY     LASER + SURG BIL   . GAS/FLUID EXCHANGE Right 09/17/2013   Procedure: GAS/FLUID EXCHANGE;  Surgeon: Hayden Pedro, MD;  Location: [place];  Service: Ophthalmology;  Laterality: Right;  . gastric sleeve surgery  08/17/2016  . HIP ARTHROPLASTY Left   . INTRAOCULAR LENS INSERTION Bilateral    lens disease due to homocysteinuria  . JOINT REPLACEMENT Left    THR  . NASAL SEPTUM SURGERY  march 2016   @ [facility]  . PARS PLANA VITRECTOMY Right 09/17/2013   Procedure: PARS PLANA VITRECTOMY WITH 25G REMOVAL/SUTURE SECONDARY INTRAOCULAR LENS;  Surgeon: Hayden Pedro, MD;  Location: [place];  Service: Ophthalmology;  Laterality: Right;  . PHOTOCOAGULATION Right 09/17/2013   Procedure: PHOTOCOAGULATION;  Surgeon: Hayden Pedro, MD;  Location: [place];  Service: Ophthalmology;  Laterality: Right;  HEADSCOPE LASER  . PULSE GENERATOR IMPLANT N/A 02/13/2019   Procedure: UNILATERAL PULSE GENERATOR IMPLANT;  Surgeon: Meade Maw, MD;  Location: [facility];  Service: Neurosurgery;  Laterality: N/A;  . scapholunate ligament reconstruction of right hand  2011  . UMBILICAL HERNIA REPAIR N/A 05/07/2015   Procedure: HERNIA REPAIR UMBILICAL ADULT;  Surgeon: Florene Glen, MD;  Location: [facility];  Service: General;  Laterality: N/A;  . UPPER GASTROINTESTINAL ENDOSCOPY  2012  . WRIST FUSION Left   . WRIST SURGERY Right     Medical History: Past Medical History:  Diagnosis Date  . Anxiety   . Arthritis    knees,Right wrist  . Avascular necrosis ([place])   . Brain bleed ([place])   . Cataract    bilateral repair with lens implants  . Depression   . Family history of adverse reaction to anesthesia    n/v-mom  . GERD  (gastroesophageal reflux disease)   . Homocystinuria ([place])   . Hypertension   . Idiopathic progressive polyneuropathy   . Lens disease   . Neuromuscular disorder ([place])   . Obesity, Class II, BMI 35-39.9   . OCD (obsessive compulsive disorder)   . Oxygen deficiency    2 liters  . Paresthesia 06/10/2015  . Sleep apnea    cpap  . Stroke [facility])    with brain bleed at age 22-no deficits  . Umbilical hernia     Family History: Family History  Problem Relation Age of Onset  . Breast cancer Mother   . Hypertension Mother   . Colon polyps Mother   . Hypertension Father   . Colon polyps  Father   . Stroke Maternal Grandmother   . Stroke Maternal Grandfather   . Stroke Paternal Grandmother   . Stroke Paternal Grandfather   . Stroke Other   . Colon cancer Neg Hx   . Esophageal cancer Neg Hx   . Rectal cancer Neg Hx   . Stomach cancer Neg Hx     Social History   Socioeconomic History  . Marital status: Single    Spouse name: n/a  . Number of children: 0  . Years of education: college  . Highest education level: Not on file  Occupational History  . Occupation: unemployed/disabled    Comment: Lexicographer  Tobacco Use  . Smoking status: Never Smoker  . Smokeless tobacco: Never Used  Vaping Use  . Vaping Use: Never used  Substance and Sexual Activity  . Alcohol use: No    Alcohol/week: 0.0 standard drinks  . Drug use: No  . Sexual activity: Not on file  Other Topics Concern  . Not on file  Social History Narrative   Lives alone. Parents live nearby.  Applying for disability (OCD) due to wrist injury, hearing upcoming.      Patient drinks 4-5 cups of caffeine daily.   Patient is right handed.   Social Determinants of Health   Financial Resource Strain: Not on file  Food Insecurity: Not on file  Transportation Needs: Not on file  Physical Activity: Not on file  Stress: Not on file  Social Connections: Not on file  Intimate Partner Violence: Not on file       Review of Systems  Constitutional: Negative for chills, fatigue and unexpected weight change.  HENT: Positive for congestion and sore throat. Negative for postnasal drip, rhinorrhea and sneezing.   Eyes: Negative for redness.  Respiratory: Positive for cough. Negative for chest tightness and shortness of breath.   Cardiovascular: Negative for chest pain and palpitations.  Gastrointestinal: Negative for abdominal pain, constipation, diarrhea, nausea and vomiting.  Genitourinary: Negative for dysuria and frequency.  Musculoskeletal: Negative for arthralgias, back pain, joint swelling and neck pain.  Skin: Negative for rash.  Neurological: Negative for tremors and numbness.  Hematological: Negative for adenopathy. Does not bruise/bleed easily.  Psychiatric/Behavioral: Negative for behavioral problems (Depression), sleep disturbance and suicidal ideas. The patient is not nervous/anxious.     Vital Signs: BP 139/90   Temp 97.9 F (36.6 C)   Resp 16   Ht 6\' 3"  (1.905 m)   BMI 44.25 kg/m    Observation/Objective: No acute/respiratory distress noted. Alert and oriented, answers questions appropriately.  Assessment/Plan: 1. Acute non-recurrent pansinusitis Treat with ZPAK, advised to contact office if symptoms worsen or have not improved within 10 days - azithromycin (ZITHROMAX Z-PAK) 250 MG tablet; Take two 250 mg tablets on day one followed by one 250 mg tablet each day for four days.  Dispense: 6 each; Refill: 0  2. Acute viral pharyngitis Swish with lidocaine as needed for sore throat - lidocaine (XYLOCAINE) 2 % solution; Use as directed 15 mLs in the mouth or throat as needed for mouth pain.  Dispense: 15 mL; Refill: 0  General Counseling: Dacotah verbalizes understanding of the findings of today's phone visit and agrees with plan of treatment. I have discussed any further diagnostic evaluation that may be needed or ordered today. We also reviewed his medications today. he has  been encouraged to call the office with any questions or concerns that should arise related to todays visit.  Meds ordered this encounter  Medications  . azithromycin (ZITHROMAX Z-PAK) 250 MG tablet    Sig: Take two 250 mg tablets on day one followed by one 250 mg tablet each day for four days.    Dispense:  6 each    Refill:  0  . lidocaine (XYLOCAINE) 2 % solution    Sig: Use as directed 15 mLs in the mouth or throat as needed for mouth pain.    Dispense:  15 mL    Refill:  0     Time spent: 25 Minutes Time spent includes review of chart, medications, test results and follow-up plan with the patient.  Tanna Furry Jesus Crosby AGNP-C Internal medicine

## 2021-01-07 ENCOUNTER — Encounter: Payer: Self-pay | Admitting: Hospice and Palliative Medicine

## 2021-01-07 ENCOUNTER — Telehealth: Payer: Self-pay

## 2021-01-07 NOTE — Telephone Encounter (Signed)
lmom to call us back he left in voicemail

## 2021-01-11 ENCOUNTER — Encounter: Payer: Self-pay | Admitting: Hospice and Palliative Medicine

## 2021-01-11 ENCOUNTER — Other Ambulatory Visit: Payer: Self-pay | Admitting: Internal Medicine

## 2021-01-11 MED ORDER — PREDNISONE 10 MG PO TABS: ORAL_TABLET | ORAL | 0 refills | Status: DC

## 2021-01-11 MED ORDER — ALBUTEROL SULFATE HFA 108 (90 BASE) MCG/ACT IN AERS: 2.0000 | INHALATION_SPRAY | Freq: Four times a day (QID) | RESPIRATORY_TRACT | 0 refills | Status: DC | PRN

## 2021-01-11 MED ORDER — LEVOFLOXACIN 500 MG PO TABS: 500.0000 mg | ORAL_TABLET | Freq: Every day | ORAL | 0 refills | Status: DC

## 2021-01-13 ENCOUNTER — Other Ambulatory Visit: Payer: Self-pay | Admitting: Adult Health

## 2021-01-13 DIAGNOSIS — E7849 Other hyperlipidemia: Secondary | ICD-10-CM

## 2021-01-15 ENCOUNTER — Telehealth: Payer: Self-pay

## 2021-01-15 DIAGNOSIS — Z79899 Other long term (current) drug therapy: Secondary | ICD-10-CM | POA: Diagnosis not present

## 2021-01-15 DIAGNOSIS — G894 Chronic pain syndrome: Secondary | ICD-10-CM | POA: Diagnosis not present

## 2021-01-15 DIAGNOSIS — G629 Polyneuropathy, unspecified: Secondary | ICD-10-CM | POA: Diagnosis not present

## 2021-01-15 DIAGNOSIS — G9050 Complex regional pain syndrome I, unspecified: Secondary | ICD-10-CM | POA: Diagnosis not present

## 2021-01-15 NOTE — Telephone Encounter (Signed)
Sleep study scheduled on 01-25-21, LMOM to schedule f/u appt to review results

## 2021-01-19 ENCOUNTER — Telehealth: Payer: Self-pay

## 2021-01-19 ENCOUNTER — Other Ambulatory Visit: Payer: Self-pay

## 2021-01-19 DIAGNOSIS — J0140 Acute pansinusitis, unspecified: Secondary | ICD-10-CM

## 2021-01-19 MED ORDER — AZITHROMYCIN 250 MG PO TABS: ORAL_TABLET | ORAL | 0 refills | Status: DC

## 2021-01-21 DIAGNOSIS — M722 Plantar fascial fibromatosis: Secondary | ICD-10-CM | POA: Diagnosis not present

## 2021-01-25 ENCOUNTER — Encounter (INDEPENDENT_AMBULATORY_CARE_PROVIDER_SITE_OTHER): Payer: Medicare HMO | Admitting: Internal Medicine

## 2021-01-25 ENCOUNTER — Other Ambulatory Visit: Payer: Self-pay | Admitting: Hospice and Palliative Medicine

## 2021-01-25 ENCOUNTER — Telehealth: Payer: Self-pay

## 2021-01-25 DIAGNOSIS — G4733 Obstructive sleep apnea (adult) (pediatric): Secondary | ICD-10-CM

## 2021-01-25 NOTE — Telephone Encounter (Signed)
LMOM to make appt f/u congestion, cough, wheezing

## 2021-01-27 NOTE — Telephone Encounter (Signed)
Spoke to pt, he is feeling better

## 2021-01-31 NOTE — Procedures (Signed)
[place] Report Part I                                                               Phone: [phone] Fax: [phone]  Patient Name: Nell, Schrack Acquisition Number: [id number]  Date of Birth: [birth year] Acquisition Date: 01/25/2021  Referring Physician: Theodoro Grist, NP     History: The patient is a 55 year old male who was referred for evaluation of obstructive sleep apnea. Medical History: anxiety, arthritis, avascular necrosis, depression, GERD, hypertension, obesity, sleep apnea, stroke, oxygen deficiency.  Medications: Xanax, Norvasc, aspirin 81, Lipitor, Cystadane, Lotrimin, Restasis, Bentyl, Cymbalta, Folvite, Lasix, Neurontin, Cozaar, Lopressor, Protonix, vitamin B-6, Zoloft, suvorexant, cyanocobalamin, [place], oxycodone.  Procedure: This routine overnight polysomnogram was performed on the [facility] 5 using the standard diagnostic protocol. This included 6 channels of EEG, 2 channels of EOG, chin EMG, bilateral anterior tibialis EMG, nasal/oral thermistor, PTAF (nasal pressure transducer), chest and abdominal wall movements, EKG, and pulse oximetry.  Description: The total recording time was 401.5 minutes. The total sleep time was 243.0 minutes. There were a total of 132.9 minutes of wakefulness after sleep onset for a poor??sleep efficiency of 60.5%. The latency to sleep onset was within normal limits?at 25.6 minutes. The R sleep onset latency was slightly prolonged at 124.0 minutes.? Sleep parameters, as a percentage of the total sleep time, demonstrated 9.5% of sleep was in N1 sleep, 77.4% N2, 3.5% N3 and 9.7% R sleep. There were a total of 33 arousals for an arousal index of 8.1 arousals per hour of sleep that was normal.???  Respiratory monitoring demonstrated nearly continuous moderate degree of snoring. All sleep was in the supine position. There were 54 apneas and hypopneas for an Apnea Hypopnea Index of 13.3 apneas and  hypopneas per hour of sleep. The REM related apnea hypopnea index was 43.4/hr of REM sleep compared to a NREM AHI of 9.8/hr. The Respiratory Disturbance Index, which includes 2 respiratory effort related arousals (RERAs), was 13.8 respiratory events per hour of sleep.  The average duration of the respiratory events was 33.4 seconds with a maximum duration of 47.0 seconds. The respiratory events were associated with peripheral oxygen desaturations on the average to 88%. The lowest oxygen desaturation associated with a respiratory event was 83%. Additionally, the baseline oxygen saturation during wakefulness was 91%, during NREM sleep averaged 83%, and during REM sleep averaged 92%. The total duration of oxygen < 90% was 38.2 minutes and <80% was 28.4 minutes.  Cardiac monitoring- did not demonstrate transient cardiac decelerations associated with the apneas. There were no significant cardiac rhythm irregularities.   Periodic limb movement monitoring- did not demonstrate periodic limb movements during sleep, however quasi-periodic limb movements were observed during periods of wakefulness.   Impression: ???This routine overnight polysomnogram confirms the presence of significant obstructive sleep apnea with an overall Apnea Hypopnea Index of 13.3 apneas and hypopneas per hour of sleep, which increased to 43.4 in REM sleep. As REM percentage was reduced, the findings likely underestimate the severity of the sleep apnea. All sleep was in the supine position.   Quasi-periodic limb movements were observed during periods of wakefulness. Sometimes these limb movements subside once the apnea  is controlled. Clinical correlation is suggested.  There was a poor sleep efficiency with???increased awakenings?and reduced percentages of REM and slow wave sleep.??? These findings would appear to be due to the combination of obstructive sleep apnea and limb movements.????????  ??Recommendations:    1. A CPAP titration  would be recommended due to the severity of the sleep apnea. Some supine sleep should be ensured to optimize the titration 2. Additionally, would recommend weight loss in a patient with a BMI of 43.1.     [name], MD, [facility] Diplomate ABMS-Pulmonary, Critical Care and Sleep Medicine  Electronically reviewed and digitally signed     [place] Report Part II  Phone: [phone] Fax: [phone]  Patient last name Koah Chisenhall Neck Size    18 in. Acquisition [phone]  Patient first name Aaden Weight    345 lbs. Started 01/25/2021 at 10:43:15 PM  Birth date Nov 24, 1966 Height    75 in. Stopped 01/26/2021 at 5:33:45 AM  Age 36 BMI   43.1 lb/in2 Duration 401.5  Study Type Adult      Report generated by: Adriana Mccallum, RPSGT Sleep Data: Lights Out: 10:47:39 PM Sleep Onset: 11:13:15 PM  Lights On: 5:29:09 AM Sleep Efficiency: 60.5 %  Total Recording Time: 401.5 min Sleep Latency (from Lights Off) 25.6 min  Total Sleep Time (TST): 243.0 min R Latency (from Sleep Onset): 124.0 min  Sleep Period Time: 354.5 min Total number of awakenings: 32  Wake during sleep: 111.5 min Wake After Sleep Onset (WASO): 132.9 min   Sleep Data:         Arousal Summary: Stage  Latency from lights out (min) Latency from sleep onset (min) Duration (min) % Total Sleep Time  Normal values  N 1 25.6 0.0 23.0 9.5 (5%)  N 2 26.1 0.5 188.0 77.4 (50%)  N 3 287.6 262.0 8.5 3.5 (20%)  R 149.6 124.0 23.5 9.7 (25%)    Number Index  Spontaneous 100 24.7  Apneas & Hypopneas 12 3.0  RERAs 2 0.5       (Apneas & Hypopneas & RERAs)  (14) (3.5)  Limb Movement 0 0.0  Snore 0 0.0  TOTAL 114 28.1      Respiratory Data:  CA OA MA Apnea Hypopnea* A+ H RERA Total  Number 0 0 0 0 54 54 2 56  Mean Dur (sec) 0.0 0.0 0.0 0.0 33.6 33.6 27.8 33.4  Max Dur (sec) 0.0 0.0 0.0 0.0 47.0 47.0 32.0 47.0  Total Dur (min) 0.0 0.0 0.0 0.0 30.3 30.3 0.9 31.2  % of TST 0.0 0.0 0.0 0.0 12.5 12.5 0.4 12.8   Index (#/h TST) 0.0 0.0 0.0 0.0 13.3 13.3 0.5 13.8  *Hypopneas scored based on 4% or greater desaturation.  Sleep Stage:        REM NREM TST  AHI 43.4 9.8 13.3  RDI 43.4 10.4 13.8           Body Position Data:  Sleep (min) TST (%) REM (min) NREM (min) CA (#) OA (#) MA (#) HYP (#) AHI (#/h) RERA (#) RDI (#/h) Desat (#)  Supine 243.0 100.00 23.5 219.5 0 0 0 54 13.3 2 13.8 81  Non-Supine 0.00 0.00 0.00 0.00 0.00 0.00 0.00 0.00 0.00 0 0.00 0.00  Left: 0.0 0.00 0.0 0.0 0 0 0 0 0.0 0 0.00 0  Prone: 0.0 0.00 0.0 0.0 0 0 0 0 0.0 0 0.00 0  UP: 0.0 0.00 0.0 0.0 0 0  0 0 0.0 0 0.00 0     Snoring: Total number of snoring episodes  0  Total time with snoring    min (   % of sleep)   Oximetry Distribution:             WK REM NREM TOTAL  Average (%)   91 92 83 86  < 90% 6.5 5.1 26.6 38.2  < 80% 4.3 0.0 24.1 28.4  < 70% 4.3 0.0 24.1 28.4  # of Desaturations* 4 19 59 82  Desat Index (#/hour) 1.6 48.5 16.2 20.3  Desat Max (%) 5 12 10 12   Desat Max Dur (sec) 29.0 73.0 67.0 73.0  Approx Min O2 during sleep 83  Approx min O2 during a respiratory event 83  Was Oxygen added (Y/N) and final rate No:   0 LPM  *Desaturations based on 3% or greater drop from baseline.   Cheyne Stokes Breathing: None Present    Heart Rate Summary:  Average Heart Rate During Sleep 64.1 bpm      Highest Heart Rate During Sleep (95th %) 70.0 bpm      Highest Heart Rate During Sleep 169 bpm (artifact)  Highest Heart Rate During Recording (TIB) 209 bpm (artifact)   Heart Rate Observations: Event Type # Events   Bradycardia 0 Lowest HR Scored: N/A  Sinus Tachycardia During Sleep 0 Highest HR Scored: N/A  Narrow Complex Tachycardia 0 Highest HR Scored: N/A  Wide Complex Tachycardia 0 Highest HR Scored: N/A  Asystole 0 Longest Pause: N/A  Atrial Fibrillation 0 Duration Longest Event: N/A  Other Arrythmias  No Type:    Periodic Limb Movement Data: (Primary legs unless otherwise noted) Total #  Limb Movement 22 Limb Movement Index 5.4  Total # PLMS    PLMS Index     Total # PLMS Arousals    PLMS Arousal Index     Percentage Sleep Time with PLMS   min (   % sleep)  Mean Duration limb movements (secs)

## 2021-02-01 DIAGNOSIS — G4733 Obstructive sleep apnea (adult) (pediatric): Secondary | ICD-10-CM | POA: Diagnosis not present

## 2021-02-02 ENCOUNTER — Other Ambulatory Visit: Payer: Self-pay

## 2021-02-02 ENCOUNTER — Ambulatory Visit
Admission: RE | Admit: 2021-02-02 | Discharge: 2021-02-02 | Disposition: A | Discharge status: Home / Self Care | Payer: Medicare HMO | Source: Ambulatory Visit | Attending: Orthopaedic Surgery | Admitting: Orthopaedic Surgery

## 2021-02-02 ENCOUNTER — Encounter: Payer: Self-pay | Admitting: Internal Medicine

## 2021-02-02 DIAGNOSIS — Z9889 Other specified postprocedural states: Secondary | ICD-10-CM | POA: Diagnosis not present

## 2021-02-02 DIAGNOSIS — M19032 Primary osteoarthritis, left wrist: Secondary | ICD-10-CM | POA: Diagnosis not present

## 2021-02-02 DIAGNOSIS — Z981 Arthrodesis status: Secondary | ICD-10-CM | POA: Diagnosis not present

## 2021-02-02 DIAGNOSIS — M7989 Other specified soft tissue disorders: Secondary | ICD-10-CM | POA: Diagnosis not present

## 2021-02-02 NOTE — Telephone Encounter (Signed)
Can u please make app for Thursday. Labs uric acid level, or and ABI

## 2021-02-03 ENCOUNTER — Encounter: Payer: Self-pay | Admitting: Hospice and Palliative Medicine

## 2021-02-03 ENCOUNTER — Ambulatory Visit (INDEPENDENT_AMBULATORY_CARE_PROVIDER_SITE_OTHER): Payer: Medicare HMO | Admitting: Hospice and Palliative Medicine

## 2021-02-03 ENCOUNTER — Telehealth: Payer: Self-pay

## 2021-02-03 ENCOUNTER — Ambulatory Visit
Admission: RE | Admit: 2021-02-03 | Discharge: 2021-02-03 | Disposition: A | Discharge status: Home / Self Care | Payer: Medicare HMO | Source: Home / Self Care | Attending: Hospice and Palliative Medicine | Admitting: Hospice and Palliative Medicine

## 2021-02-03 ENCOUNTER — Ambulatory Visit
Admission: RE | Admit: 2021-02-03 | Discharge: 2021-02-03 | Disposition: A | Discharge status: Home / Self Care | Payer: Medicare HMO | Source: Ambulatory Visit | Attending: Hospice and Palliative Medicine | Admitting: Hospice and Palliative Medicine

## 2021-02-03 ENCOUNTER — Other Ambulatory Visit: Payer: Self-pay | Admitting: Hospice and Palliative Medicine

## 2021-02-03 ENCOUNTER — Other Ambulatory Visit: Payer: Self-pay

## 2021-02-03 VITALS — BP 134/74 | HR 99 | Temp 98.0°F | Resp 16 | Ht 75.0 in | Wt 366.0 lb

## 2021-02-03 DIAGNOSIS — R053 Chronic cough: Secondary | ICD-10-CM | POA: Insufficient documentation

## 2021-02-03 DIAGNOSIS — I83812 Varicose veins of left lower extremities with pain: Secondary | ICD-10-CM

## 2021-02-03 DIAGNOSIS — M79662 Pain in left lower leg: Secondary | ICD-10-CM | POA: Diagnosis not present

## 2021-02-03 DIAGNOSIS — R062 Wheezing: Secondary | ICD-10-CM | POA: Diagnosis not present

## 2021-02-03 DIAGNOSIS — M79605 Pain in left leg: Secondary | ICD-10-CM

## 2021-02-03 DIAGNOSIS — R0602 Shortness of breath: Secondary | ICD-10-CM | POA: Diagnosis not present

## 2021-02-03 DIAGNOSIS — R059 Cough, unspecified: Secondary | ICD-10-CM | POA: Diagnosis not present

## 2021-02-03 DIAGNOSIS — R0902 Hypoxemia: Secondary | ICD-10-CM | POA: Diagnosis not present

## 2021-02-03 DIAGNOSIS — M7989 Other specified soft tissue disorders: Secondary | ICD-10-CM | POA: Diagnosis not present

## 2021-02-03 NOTE — Progress Notes (Signed)
Please let him know [place] was normal.

## 2021-02-03 NOTE — Telephone Encounter (Signed)
-----   Message from [name], NP sent at 02/03/2021 12:13 PM EST ----- Please let him know [place] was normal.

## 2021-02-03 NOTE — Progress Notes (Signed)
[facility] [place], [place] [address]  Internal MEDICINE  Office Visit Note  Patient Name: Jesus Crosby  [id number]  [id number]  Date of Service: 02/04/2021  Chief Complaint  Patient presents with  . Acute Visit    Left foot red and swollen last night, neuropathy, very painful, pt worried about circulation. Breathing isn't the same as before bronchitis about 3 weeks ago, hard to catch breath, wheezing     HPI Pt is here for a sick visit.  C/o left lower extremity pain, erythema and swelling Has chronic history of neuropathy, chronic pain and homocystinuria Erythema and swelling started a few days ago but swelling and pain intensified last night, he is concerned due to his history that he may have a blood clot  C/o ongoing cough and shortness of breath, treated for bronchitis with azithromycin as well as Levaquin Continues to complain of shortness of breath with exertion and coughing  Recently had repeat PSG--will need to optimize therapy, possibly bipap for CSA, will need ONO on new machine, in the past he bled supplemental oxygen into his CPAP but machine has been broken for several months  Current Medication:  Outpatient Encounter Medications as of 02/03/2021  Medication Sig Note  . albuterol (VENTOLIN HFA) 108 (90 Base) MCG/ACT inhaler Inhale 2 puffs into the lungs every 6 (six) hours as needed for wheezing or shortness of breath.   . ALPRAZolam (XANAX) 0.5 MG tablet Take 0.5 mg by mouth 4 (four) times daily. By dr reddy 03/25/2014: .   Marland Kitchen amLODipine (NORVASC) 5 MG tablet Take one tab po bid   . aspirin EC 81 MG tablet Take 81 mg by mouth at bedtime.    Marland Kitchen atorvastatin (LIPITOR) 10 MG tablet Take 1 tablet (10 mg total) by mouth at bedtime.   . Betaine (CYSTADANE) POWD Take 8 Scoops by mouth 3 (three) times daily.   . clotrimazole (LOTRIMIN) 1 % cream APPLY EXTERNALLY TO THE AFFECTED AREA TWICE DAILY   . dicyclomine (BENTYL) 10 MG capsule TAKE 1  CAPSULE(10 MG) BY MOUTH FOUR TIMES DAILY BEFORE MEALS AND AT BEDTIME   . DULoxetine (CYMBALTA) 60 MG capsule Take 60 mg by mouth daily.   . ferrous gluconate (FERGON) 324 MG tablet Take 1 tablet (324 mg total) by mouth daily with breakfast.   . Ferrous Sulfate Dried 45 MG TBCR Take 45 mg by mouth at bedtime.   . folic acid (FOLVITE) 756 MCG tablet Take 1,200 mcg by mouth at bedtime.    . furosemide (LASIX) 20 MG tablet TAKE 1 TABLET(20 MG) BY MOUTH DAILY   . gabapentin (NEURONTIN) 800 MG tablet Take 2 tablets by mouth 2 (two) times daily.    Marland Kitchen lidocaine (XYLOCAINE) 2 % solution Use as directed 15 mLs in the mouth or throat as needed for mouth pain.   Marland Kitchen losartan (COZAAR) 50 MG tablet TAKE 1/2 TABLET(25 MG) BY MOUTH AT BEDTIME   . metoprolol tartrate (LOPRESSOR) 50 MG tablet TAKE 1 AND 1/2 TABLETS BY MOUTH TWICE DAILY   . NONFORMULARY OR COMPOUNDED ITEM See pharmacy note   . oxyCODONE (OXY IR/ROXICODONE) 5 MG immediate release tablet oxycodone 5 mg tablet  TAKE 1 TABLET BY MOUTH EVERY 6 HOURS AS NEEDED FOR PAIN   . pantoprazole (PROTONIX) 40 MG tablet Take 1 tablet (40 mg total) by mouth daily.   Marland Kitchen pyridOXINE (VITAMIN B-6) 100 MG tablet Take 500 mg by mouth at bedtime.    . sertraline (ZOLOFT)  100 MG tablet Take 200 mg by mouth daily.    . Suvorexant 20 MG TABS Take 20 mg by mouth at bedtime.   . vitamin B-12 (CYANOCOBALAMIN) 1000 MCG tablet Take 1,000 mcg by mouth at bedtime.   . [DISCONTINUED] azithromycin (ZITHROMAX Z-PAK) 250 MG tablet Take two 250 mg tablets on day one followed by one 250 mg tablet each day for four days.   . [DISCONTINUED] levofloxacin (LEVAQUIN) 500 MG tablet Take 1 tablet (500 mg total) by mouth daily.   . [DISCONTINUED] predniSONE (DELTASONE) 10 MG tablet Take one tab 3 x day for 3 days, then take one tab 2 x a day for 3 days and then take one tab a day for 3 days for copd    No facility-administered encounter medications on file as of 02/03/2021.      Medical  History: Past Medical History:  Diagnosis Date  . Anxiety   . Arthritis    knees,Right wrist  . Avascular necrosis ([place])   . Brain bleed ([place])   . Cataract    bilateral repair with lens implants  . Depression   . Family history of adverse reaction to anesthesia    n/v-mom  . GERD (gastroesophageal reflux disease)   . Homocystinuria ([place])   . Hypertension   . Idiopathic progressive polyneuropathy   . Lens disease   . Neuromuscular disorder ([place])   . Obesity, Class II, BMI 35-39.9   . OCD (obsessive compulsive disorder)   . Oxygen deficiency    2 liters  . Paresthesia 06/10/2015  . Sleep apnea    cpap  . Stroke [facility])    with brain bleed at age 5-no deficits  . Umbilical hernia      Vital Signs: BP 134/74   Pulse 99   Temp 98 F (36.7 C)   Resp 16   Ht 6\' 3"  (1.905 m)   Wt (!) 366 lb (166 kg)   SpO2 98%   BMI 45.75 kg/m    Review of Systems  Constitutional: Negative for chills, fatigue and unexpected weight change.  HENT: Negative for congestion, postnasal drip, rhinorrhea, sneezing and sore throat.   Eyes: Negative for redness.  Respiratory: Positive for cough, shortness of breath and wheezing. Negative for chest tightness.   Cardiovascular: Positive for leg swelling. Negative for chest pain and palpitations.  Gastrointestinal: Negative for abdominal pain, constipation, diarrhea, nausea and vomiting.  Genitourinary: Negative for dysuria and frequency.  Musculoskeletal: Negative for arthralgias, back pain, joint swelling and neck pain.       Lower left extremity swelling, intense redness, pain  Skin: Negative for rash.  Neurological: Negative for tremors and numbness.  Hematological: Negative for adenopathy. Does not bruise/bleed easily.  Psychiatric/Behavioral: Negative for behavioral problems (Depression), sleep disturbance and suicidal ideas. The patient is not nervous/anxious.     Physical Exam Vitals reviewed.  Constitutional:      Appearance: Normal  appearance. He is obese.  Cardiovascular:     Rate and Rhythm: Normal rate and regular rhythm.     Pulses: Normal pulses.     Heart sounds: Normal heart sounds.     Comments: Erythema to left lower leg and foot, non-pitting edema, tender to touch Pulmonary:     Effort: Pulmonary effort is normal.     Breath sounds: Normal breath sounds.  Musculoskeletal:        General: Normal range of motion.     Left lower leg: Edema present.  Skin:    General:  Skin is warm.  Neurological:     General: No focal deficit present.     Mental Status: He is alert and oriented to person, place, and time. Mental status is at baseline.     Cranial Nerves: No cranial nerve deficit.  Psychiatric:        Mood and Affect: Mood normal.        Behavior: Behavior normal.        Thought Content: Thought content normal.        Judgment: Judgment normal.    Assessment/Plan: 1. Acute pain of left lower extremity Normal ABI--will await for US/DVT r/o - VAS [place] LOWER EXTREMITY VENOUS (DVT); Future - POCT ABI Screening Pilot No Charge  2. Chronic cough Ongoing cough--will review CXR for acute abnormality - DG Chest 2 View; Future  3. Varicose veins of left lower extremity with pain - VAS [place] LOWER EXTREMITY VENOUS (DVT); Future  General Counseling: daton szilagyi understanding of the findings of todays visit and agrees with plan of treatment. I have discussed any further diagnostic evaluation that may be needed or ordered today. We also reviewed his medications today. he has been encouraged to call the office with any questions or concerns that should arise related to todays visit.   Orders Placed This Encounter  Procedures  . DG Chest 2 View  . VAS [place] LOWER EXTREMITY VENOUS (DVT)  . POCT ABI Screening Pilot No Charge    Time spent: 30 Minutes  This patient was seen by Theodoro Grist AGNP-C in Collaboration with Dr [name] as a part of collaborative care agreement.  Tanna Furry [facility] Internal Medicine

## 2021-02-03 NOTE — Telephone Encounter (Signed)
Pt advised U/s is normal negative for DVT

## 2021-02-04 ENCOUNTER — Encounter: Payer: Self-pay | Admitting: Hospice and Palliative Medicine

## 2021-02-04 ENCOUNTER — Other Ambulatory Visit: Payer: Self-pay | Admitting: Internal Medicine

## 2021-02-08 ENCOUNTER — Encounter: Payer: Self-pay | Admitting: Internal Medicine

## 2021-02-08 ENCOUNTER — Telehealth: Payer: Self-pay

## 2021-02-08 ENCOUNTER — Ambulatory Visit: Payer: Medicare HMO | Admitting: Internal Medicine

## 2021-02-08 VITALS — BP 146/80 | HR 84 | Temp 97.9°F | Resp 16 | Ht 75.0 in | Wt 360.0 lb

## 2021-02-08 DIAGNOSIS — G4733 Obstructive sleep apnea (adult) (pediatric): Secondary | ICD-10-CM

## 2021-02-08 DIAGNOSIS — I10 Essential (primary) hypertension: Secondary | ICD-10-CM

## 2021-02-08 NOTE — Patient Instructions (Signed)

## 2021-02-08 NOTE — Progress Notes (Addendum)
[facility] [place], [place] [address]  Pulmonary Sleep Medicine   Office Visit Note  Patient Name: Jesus Crosby. DOB: Jun 07, 1966 MRN [id number]  Date of Service: 02/08/2021  Complaints/HPI: He had a PSG done which shows an AHI of 13.3 per hour. He had worsening during REM sleep. Patient states his machine is not functioning well. He has not had a replacement in over 5 years. Patient has been fatigued and has been more sleepy than normal. He was also instructed on not using the soclean on the new device. Patient has no cough noted no chest pain noted. He also has significant RLS but doe shave neuropathy  ROS  General: (-) fever, (-) chills, (-) night sweats, (-) weakness Skin: (-) rashes, (-) itching,. Eyes: (-) visual changes, (-) redness, (-) itching. Nose and Sinuses: (-) nasal stuffiness or itchiness, (-) postnasal drip, (-) nosebleeds, (-) sinus trouble. Mouth and Throat: (-) sore throat, (-) hoarseness. Neck: (-) swollen glands, (-) enlarged thyroid, (-) neck pain. Respiratory: - cough, (-) bloody sputum, - shortness of breath, - wheezing. Cardiovascular: - ankle swelling, (-) chest pain. Lymphatic: (-) lymph node enlargement. Neurologic: (-) numbness, (-) tingling. Psychiatric: (-) anxiety, (-) depression   Current Medication: Outpatient Encounter Medications as of 02/08/2021  Medication Sig Note  . albuterol (VENTOLIN HFA) 108 (90 Base) MCG/ACT inhaler INHALE 2 PUFFS INTO THE LUNGS EVERY 6 HOURS AS NEEDED FOR WHEEZING OR SHORTNESS OF BREATH   . ALPRAZolam (XANAX) 0.5 MG tablet Take 0.5 mg by mouth 4 (four) times daily. By dr reddy 03/25/2014: .   Marland Kitchen amLODipine (NORVASC) 5 MG tablet Take one tab po bid   . aspirin EC 81 MG tablet Take 81 mg by mouth at bedtime.    Marland Kitchen atorvastatin (LIPITOR) 10 MG tablet Take 1 tablet (10 mg total) by mouth at bedtime.   . Betaine (CYSTADANE) POWD Take 8 Scoops by mouth 3 (three) times daily.   . clotrimazole  (LOTRIMIN) 1 % cream APPLY EXTERNALLY TO THE AFFECTED AREA TWICE DAILY   . dicyclomine (BENTYL) 10 MG capsule TAKE 1 CAPSULE(10 MG) BY MOUTH FOUR TIMES DAILY BEFORE MEALS AND AT BEDTIME   . DULoxetine (CYMBALTA) 60 MG capsule Take 60 mg by mouth daily.   . folic acid (FOLVITE) 563 MCG tablet Take 1,200 mcg by mouth at bedtime.    . furosemide (LASIX) 20 MG tablet TAKE 1 TABLET(20 MG) BY MOUTH DAILY   . gabapentin (NEURONTIN) 800 MG tablet Take 2 tablets by mouth 2 (two) times daily.    Marland Kitchen lidocaine (XYLOCAINE) 2 % solution Use as directed 15 mLs in the mouth or throat as needed for mouth pain.   Marland Kitchen losartan (COZAAR) 50 MG tablet TAKE 1/2 TABLET(25 MG) BY MOUTH AT BEDTIME   . metoprolol tartrate (LOPRESSOR) 50 MG tablet TAKE 1 AND 1/2 TABLETS BY MOUTH TWICE DAILY   . NONFORMULARY OR COMPOUNDED ITEM See pharmacy note   . oxyCODONE (OXY IR/ROXICODONE) 5 MG immediate release tablet oxycodone 5 mg tablet  TAKE 1 TABLET BY MOUTH EVERY 6 HOURS AS NEEDED FOR PAIN   . pantoprazole (PROTONIX) 40 MG tablet Take 1 tablet (40 mg total) by mouth daily.   Marland Kitchen pyridOXINE (VITAMIN B-6) 100 MG tablet Take 500 mg by mouth at bedtime.    . sertraline (ZOLOFT) 100 MG tablet Take 200 mg by mouth daily.    . Suvorexant 20 MG TABS Take 20 mg by mouth at bedtime.   . vitamin  B-12 (CYANOCOBALAMIN) 1000 MCG tablet Take 1,000 mcg by mouth at bedtime.   . ferrous gluconate (FERGON) 324 MG tablet Take 1 tablet (324 mg total) by mouth daily with breakfast.   . Ferrous Sulfate Dried 45 MG TBCR Take 45 mg by mouth at bedtime.    No facility-administered encounter medications on file as of 02/08/2021.    Surgical History: Past Surgical History:  Procedure Laterality Date  . COLONOSCOPY  2020  . COLONOSCOPY WITH PROPOFOL N/A 07/30/2015   Procedure: COLONOSCOPY WITH PROPOFOL;  Surgeon: [name], MD;  Location: [facility];  Service: Endoscopy;  Laterality: N/A;  . DERMABRASION OF FACE     due to acne scars  . distal  radius fracture of right hand (plates, screws, and pins)  2011  . ESOPHAGOGASTRODUODENOSCOPY (EGD) WITH PROPOFOL N/A 07/30/2015   Procedure: ESOPHAGOGASTRODUODENOSCOPY (EGD) WITH PROPOFOL;  Surgeon: [name], MD;  Location: [facility];  Service: Endoscopy;  Laterality: N/A;  . EYE SURGERY     LASER + SURG BIL   . GAS/FLUID EXCHANGE Right 09/17/2013   Procedure: GAS/FLUID EXCHANGE;  Surgeon: Hayden Pedro, MD;  Location: [facility];  Service: Ophthalmology;  Laterality: Right;  . gastric sleeve surgery  08/17/2016  . HIP ARTHROPLASTY Left   . INTRAOCULAR LENS INSERTION Bilateral    lens disease due to homocysteinuria  . JOINT REPLACEMENT Left    THR  . NASAL SEPTUM SURGERY  march 2016   @ [facility]  . PARS PLANA VITRECTOMY Right 09/17/2013   Procedure: PARS PLANA VITRECTOMY WITH 25G REMOVAL/SUTURE SECONDARY INTRAOCULAR LENS;  Surgeon: Hayden Pedro, MD;  Location[facility];  Service: Ophthalmology;  Laterality: Right;  . PHOTOCOAGULATION Right 09/17/2013   Procedure: PHOTOCOAGULATION;  Surgeon: Hayden Pedro, MD;  Location: [facility];  Service: Ophthalmology;  Laterality: Right;  HEADSCOPE LASER  . PULSE GENERATOR IMPLANT N/A 02/13/2019   Procedure: UNILATERAL PULSE GENERATOR IMPLANT;  Surgeon: Meade Maw, MD;  Location: [facility];  Service: Neurosurgery;  Laterality: N/A;  . scapholunate ligament reconstruction of right hand  2011  . UMBILICAL HERNIA REPAIR N/A 05/07/2015   Procedure: HERNIA REPAIR UMBILICAL ADULT;  Surgeon: Florene Glen, MD;  Location: [facility];  Service: General;  Laterality: N/A;  . UPPER GASTROINTESTINAL ENDOSCOPY  2012  . WRIST FUSION Left   . WRIST SURGERY Right     Medical History: Past Medical History:  Diagnosis Date  . Anxiety   . Arthritis    knees,Right wrist  . Avascular necrosis ([place])   . Brain bleed ([place])   . Cataract    bilateral repair with lens implants  . Depression   . Family history of adverse reaction to anesthesia    n/v-mom  .  GERD (gastroesophageal reflux disease)   . 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 Hypertension   . Idiopathic progressive polyneuropathy   . Lens disease   . Neuromuscular disorder ([place])   . Obesity, Class II, BMI 35-39.9   . OCD (obsessive compulsive disorder)   . Oxygen deficiency    2 liters  . Paresthesia 06/10/2015  . Sleep apnea    cpap  . Stroke [facility])    with brain bleed at age 35-no deficits  . Umbilical hernia     Family History: Family History  Problem Relation Age of Onset  . Breast cancer Mother   . Hypertension Mother   . Colon polyps Mother   . Hypertension Father   . Colon polyps Father   . Stroke  Maternal Grandmother   . Stroke Maternal Grandfather   . Stroke Paternal Grandmother   . Stroke Paternal Grandfather   . Stroke Other   . Colon cancer Neg Hx   . Esophageal cancer Neg Hx   . Rectal cancer Neg Hx   . Stomach cancer Neg Hx     Social History: Social History   Socioeconomic History  . Marital status: Single    Spouse name: n/a  . Number of children: 0  . Years of education: college  . Highest education level: Not on file  Occupational History  . Occupation: unemployed/disabled    Comment: Lexicographer  Tobacco Use  . Smoking status: Never Smoker  . Smokeless tobacco: Never Used  Vaping Use  . Vaping Use: Never used  Substance and Sexual Activity  . Alcohol use: No    Alcohol/week: 0.0 standard drinks  . Drug use: No  . Sexual activity: Not on file  Other Topics Concern  . Not on file  Social History Narrative   Lives alone. Parents live nearby.  Applying for disability (OCD) due to wrist injury, hearing upcoming.      Patient drinks 4-5 cups of caffeine daily.   Patient is right handed.   Social Determinants of Health   Financial Resource Strain: Not on file  Food Insecurity: Not on file  Transportation Needs: Not on file  Physical Activity: Not on file  Stress: Not on file  Social Connections: Not on file  Intimate Partner  Violence: Not on file    Vital Signs: Blood pressure (!) 146/80, pulse 84, temperature 97.9 F (36.6 C), resp. rate 16, height 6\' 3"  (1.905 m), weight (!) 360 lb (163.3 kg), SpO2 98 %.  Examination: General Appearance: The patient is well-developed, well-nourished, and in no distress. Skin: Gross inspection of skin unremarkable. Head: normocephalic, no gross deformities. Eyes: no gross deformities noted. ENT: ears appear grossly normal no exudates. Neck: Supple. No thyromegaly. No LAD. Respiratory: no rhonchi noted. Cardiovascular: Normal S1 and S2 without murmur or rub. Extremities: No cyanosis. pulses are equal. Neurologic: Alert and oriented. No involuntary movements.  LABS: No results found for this or any previous visit (from the past 2160 hour(s)).  Radiology: DG Chest 2 View  Result Date: 02/04/2021 CLINICAL DATA:  Cough, shortness of breath, and wheezing for 3 weeks. Hypoxia. EXAM: CHEST - 2 VIEW COMPARISON:  04/29/2016 FINDINGS: The heart size and mediastinal contours are within normal limits. Both lungs are clear. Neurostimulator leads are seen in the thoracic spinal canal. IMPRESSION: No active cardiopulmonary disease. Electronically Signed   By: Marlaine Hind M.D.   On: 02/04/2021 08:16   US Venous Img Lower Unilateral Left (DVT)  Result Date: 02/03/2021 CLINICAL DATA:  Left lower extremity pain and swelling EXAM: LEFT LOWER EXTREMITY VENOUS DOPPLER ULTRASOUND TECHNIQUE: Gray-scale sonography with graded compression, as well as color Doppler and duplex ultrasound were performed to evaluate the lower extremity deep venous systems from the level of the common femoral vein and including the common femoral, femoral, profunda femoral, popliteal and calf veins including the posterior tibial, peroneal and gastrocnemius veins when visible. The superficial great saphenous vein was also interrogated. Spectral Doppler was utilized to evaluate flow at rest and with distal augmentation  maneuvers in the common femoral, femoral and popliteal veins. COMPARISON:  None. FINDINGS: Contralateral Common Femoral Vein: Respiratory phasicity is normal and symmetric with the symptomatic side. No evidence of thrombus. Normal compressibility. Common Femoral Vein: No evidence of thrombus. Normal compressibility,  respiratory phasicity and response to augmentation. Saphenofemoral Junction: No evidence of thrombus. Normal compressibility and flow on color Doppler imaging. Profunda Femoral Vein: No evidence of thrombus. Normal compressibility and flow on color Doppler imaging. Femoral Vein: No evidence of thrombus. Normal compressibility, respiratory phasicity and response to augmentation. Popliteal Vein: No evidence of thrombus. Normal compressibility, respiratory phasicity and response to augmentation. Calf Veins: No evidence of thrombus. Normal compressibility and flow on color Doppler imaging. Superficial Great Saphenous Vein: No evidence of thrombus. Normal compressibility. Venous Reflux:  None. Other Findings:  None. IMPRESSION: No evidence of left lower extremity deep venous thrombosis. Electronically Signed   By: Davina Poke D.O.   On: 02/03/2021 11:16    No results found.  DG Chest 2 View  Result Date: 02/04/2021 CLINICAL DATA:  Cough, shortness of breath, and wheezing for 3 weeks. Hypoxia. EXAM: CHEST - 2 VIEW COMPARISON:  04/29/2016 FINDINGS: The heart size and mediastinal contours are within normal limits. Both lungs are clear. Neurostimulator leads are seen in the thoracic spinal canal. IMPRESSION: No active cardiopulmonary disease. Electronically Signed   By: Marlaine Hind M.D.   On: 02/04/2021 08:16   CT WRIST LEFT WO CONTRAST  Result Date: 02/03/2021 CLINICAL DATA:  Left wrist pain, decreased range of motion. History of wrist fusion in September. EXAM: CT OF THE LEFT WRIST WITHOUT CONTRAST TECHNIQUE: Multidetector CT imaging was performed according to the standard protocol. Multiplanar  CT image reconstructions were also generated. COMPARISON:  X-ray 04/15/2014 FINDINGS: Bones/Joint/Cartilage Postsurgical changes proximal row carpectomy. Dorsal plate and screw fixation construct extends from the distal radial diaphysis to the third metacarpal diaphysis. Solid bony ankylosis of the radius and capitate. There are adjacent fragments of bone the radiocarpal joint which may represent residual pieces of the scaphoid and lunate versus bone graft material. Largest of these fragments in the scaphoid resection bed is solidly fused to the capitate and radial styloid (series 11, image 60). Hardware is intact without perihardware lucency or fracture. No evidence of acute fracture or dislocation. Moderate osteoarthritis of the first carpometacarpal joint. Bones appear demineralized. Ligaments Suboptimally assessed by CT. Muscles and Tendons No musculotendinous injury by CT. No appreciable tenosynovial fluid collections. Soft tissues Mild soft tissue swelling over the dorsum of the wrist. No fluid collection or hematoma. IMPRESSION: 1. Postsurgical changes of proximal row carpectomy and wrist fusion with solid ankylosis of the radius and capitate. 2. Moderate osteoarthritis of the first carpometacarpal joint. Electronically Signed   By: Davina Poke D.O.   On: 02/03/2021 08:22   US Venous Img Lower Unilateral Left (DVT)  Result Date: 02/03/2021 CLINICAL DATA:  Left lower extremity pain and swelling EXAM: LEFT LOWER EXTREMITY VENOUS DOPPLER ULTRASOUND TECHNIQUE: Gray-scale sonography with graded compression, as well as color Doppler and duplex ultrasound were performed to evaluate the lower extremity deep venous systems from the level of the common femoral vein and including the common femoral, femoral, profunda femoral, popliteal and calf veins including the posterior tibial, peroneal and gastrocnemius veins when visible. The superficial great saphenous vein was also interrogated. Spectral Doppler was  utilized to evaluate flow at rest and with distal augmentation maneuvers in the common femoral, femoral and popliteal veins. COMPARISON:  None. FINDINGS: Contralateral Common Femoral Vein: Respiratory phasicity is normal and symmetric with the symptomatic side. No evidence of thrombus. Normal compressibility. Common Femoral Vein: No evidence of thrombus. Normal compressibility, respiratory phasicity and response to augmentation. Saphenofemoral Junction: No evidence of thrombus. Normal compressibility and flow on color Doppler  imaging. Profunda Femoral Vein: No evidence of thrombus. Normal compressibility and flow on color Doppler imaging. Femoral Vein: No evidence of thrombus. Normal compressibility, respiratory phasicity and response to augmentation. Popliteal Vein: No evidence of thrombus. Normal compressibility, respiratory phasicity and response to augmentation. Calf Veins: No evidence of thrombus. Normal compressibility and flow on color Doppler imaging. Superficial Great Saphenous Vein: No evidence of thrombus. Normal compressibility. Venous Reflux:  None. Other Findings:  None. IMPRESSION: No evidence of left lower extremity deep venous thrombosis. Electronically Signed   By: Davina Poke D.O.   On: 02/03/2021 11:16      Assessment and Plan: Patient Active Problem List   Diagnosis Date Noted  . Spinal cord stimulator status 01/08/2020  . Complex regional pain syndrome type 2 of both lower extremities 01/03/2019  . Neuropathic pain of both legs 01/03/2019  . Aphasia 10/04/2018  . Arthritis 04/26/2018  . Arthritis of knee 04/26/2018  . Bilateral cataracts 04/26/2018  . Class 1 obesity 04/26/2018  . Disorder of lens 04/26/2018  . Exomphalos 04/26/2018  . OSA on CPAP 04/26/2018  . Prediabetes 04/26/2018  . History of total hip arthroplasty 07/05/2017  . Spinal stenosis of lumbar region 04/25/2017  . Trochanteric bursitis 02/27/2017  . S/P laparoscopic sleeve gastrectomy 08/17/2016  .  Alcohol-induced mood disorder ([place])   . OCD (obsessive compulsive disorder)   . Paresthesia 06/10/2015  . Diplopia 08/15/2014  . Sinus tachycardia 08/15/2014  . CVA (cerebral infarction) 08/14/2014  . Neuropathy 04/04/2014  . Intraocular lens dislocation 09/12/2013  . DDD (degenerative disc disease), lumbosacral 07/04/2013  . HTN (hypertension) 07/04/2013  . Major depressive disorder, single episode 07/03/2009  . 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 HYPERLIPIDEMIA 05/28/2008  . ANXIETY 05/28/2008  . OBSESSIVE-COMPULSIVE DISORDER 05/28/2008    1. OSA (obstructive sleep apnea) He will need to have a titration done. Will get this scheduled now for him. Once done will gt new CPAP device  2. Obesity, morbid ([place])  Obesity Counseling: Risk Assessment: An assessment of behavioral risk factors was made today and includes lack of exercise sedentary lifestyle, lack of portion control and poor dietary habits.  Risk Modification Advice: She was counseled on portion control guidelines. Restricting daily caloric intake to 2000. The detrimental long term effects of obesity on her health and ongoing poor compliance was also discussed with the patient.    3. Essential hypertension Follow up with his PCP  General Counseling: I have discussed the findings of the evaluation and examination with Glendell Docker.  I have also discussed any further diagnostic evaluation thatmay be needed or ordered today. Yaroslav verbalizes understanding of the findings of todays visit. We also reviewed his medications today and discussed drug interactions and side effects including but not limited excessive drowsiness and altered mental states. We also discussed that there is always a risk not just to him but also people around him. he has been encouraged to call the office with any questions or concerns that should arise related to todays visit.  Orders Placed This Encounter  Procedures  . Cpap  titration    Standing Status:   Future    Standing Expiration Date:   02/08/2022    Order Specific Question:   Where should this test be performed:    Answer:   [facility]     Time spent: 62  I have personally obtained a history, examined the patient, evaluated laboratory and imaging results, formulated the assessment and plan and placed orders.  [name], MD [facility] Pulmonary and Critical Care Sleep medicine

## 2021-02-08 NOTE — Addendum Note (Signed)
Addended by: Devona Konig on: 02/08/2021 11:44 AM   Modules accepted: Orders

## 2021-02-08 NOTE — Telephone Encounter (Signed)
Gave orders to [facility] for sleep study. Aidin Doane

## 2021-02-09 DIAGNOSIS — F429 Obsessive-compulsive disorder, unspecified: Secondary | ICD-10-CM | POA: Diagnosis not present

## 2021-02-09 DIAGNOSIS — F332 Major depressive disorder, recurrent severe without psychotic features: Secondary | ICD-10-CM | POA: Diagnosis not present

## 2021-02-10 DIAGNOSIS — Z79891 Long term (current) use of opiate analgesic: Secondary | ICD-10-CM | POA: Diagnosis not present

## 2021-02-15 ENCOUNTER — Encounter (INDEPENDENT_AMBULATORY_CARE_PROVIDER_SITE_OTHER): Payer: Medicare HMO | Admitting: Internal Medicine

## 2021-02-15 DIAGNOSIS — G4733 Obstructive sleep apnea (adult) (pediatric): Secondary | ICD-10-CM

## 2021-02-19 ENCOUNTER — Telehealth: Payer: Self-pay

## 2021-02-19 NOTE — Telephone Encounter (Signed)
Patient called, cpap is broken, he needs to have his sleep study moved up with [facility], then we can move up his follow up with [facility]. JS

## 2021-02-19 NOTE — Telephone Encounter (Signed)
CMN for cpap therapy signed by provider and placed in [facility] folder. Loma Sousa

## 2021-02-23 NOTE — Procedures (Signed)
[facility] Report Part I  Phone: [phone] Fax: [phone]  Patient Name: Jesus Crosby, Jesus Crosby Acquisition Number: [id number]  Date of Birth: 06-22-1966 Acquisition Date: 02/15/2021  Referring Physician: Theodoro Grist, NP     History: The patient is a 55 year old male with obstructive sleep apnea for CPAP titration. Medical History: anxiety, arthritids, avascular necrosis, depression, GERD, hypertension, obesity, sleep apnea, stroke, oxygen deficiency.  Medications: Xanax, Norvasc, aspirin 81, Lipitor, Cystadane, Lotrimin, Restasis, Bentyl, Cymbalta, Folvite, Lasix, Neurontin, Cozaar, Lopressor, Protonix, vitamin B-6, Zoloft, suvorexant, cyanocobalamin, [place], oxycodone.  Procedure: This routine overnight polysomnogram was performed on the [facility] 5 using the standard CPAP protocol. This included 6 channels of EEG, 2 channels of EOG, chin EMG, bilateral anterior tibialis EMG, nasal/oral thermistor, PTAF (nasal pressure transducer), chest and abdominal wall movements, EKG, and pulse oximetry.  Description: The total recording time was 396.7 minutes. The total sleep time was 296.4 minutes. There were a total of 79.5 minutes of wakefulness after sleep onset for a reduced????sleep efficiency of 74.7%. The latency to sleep onset was within normal limits?? at 20.8 minutes. The R sleep onset latency was very short at 25.5 minutes.???? Sleep parameters, as a percentage of the total sleep time, demonstrated 7.9% of sleep was in N1 sleep, 80.3% N2, 0.0% N3 and 11.8% R sleep. There were a total of 50 arousals for an arousal index of 10.1 arousals per hour of sleep that was normal.???  Overall, there were a total of 41 respiratory events for a respiratory disturbance index, which includes apneas, hypopneas and RERAs (increased respiratory effort) of 8.3 respiratory events per hour of sleep during the pressure titration. CPAP was initiated at 12 cm H2O for patient comfort at  lights out, 11:28 p.m. Once the patient was asleep, the pressure was lowered to 9 cm H2O and then retitrated 1-2 cm increments for intermittent hypopneas to14cm H2O. The apnea was well controlled at this pressure. The pressure was further titrated to the final pressure of 15 cm of H2O.   Additionally, the baseline oxygen saturation during wakefulness was 96%, during NREM sleep averaged 95%, and during REM sleep averaged 94%. The total duration of oxygen < 90% was 0.2 minutes.  Cardiac monitoring- There were no significant cardiac rhythm irregularities.   Periodic limb movement monitoring- demonstrated that there were 84 periodic limb movements for a periodic limb movement index of 17.0 periodic limb movements per hour of sleep.     Impression: This patient's obstructive sleep apnea demonstrated significant improvement with the utilization of nasal CPAP at 14 cm H2O.   There was a significantly elevated periodic limb movement index of 17.0 periodic limb movements per hour of sleep. These limb movements were also observed during the prior polysomnogram.  Recommendations: 1. Would recommend utilization of nasal CPAP at 14 cm H2O.      2. A ResMed Mirage Quattro large mask was used. Chin strap used during study- no. Humidifier used during study- yes.     [name], MD, [facility] Diplomate ABMS-Pulmonary, Critical Care and Sleep Medicine  Electronically reviewed and digitally signed    [place] CPAP/BIPAP Polysomnogram Report Part II Phone: [phone] Fax: [phone]  Patient last name Amedeo Detweiler Neck Size 18.5 in. Acquisition [id number]  Patient first name Jesus Crosby Weight 345.0 lbs. Started 02/15/2021 at 11:24:32 PM  Birth date 02/28/1966 Height 75.0 in. Stopped 02/16/2021 at 6:05:26 AM  Age 57      Type  Adult BMI 43.1 lb/in2 Duration 396.7  Adriana Mccallum, RPSGT Sleep Data: Lights Out: 11:28:14 PM Sleep Onset: 11:49:02 PM  Lights On: 6:04:56 AM Sleep Efficiency: 74.7 %   Total Recording Time: 396.7 min Sleep Latency (from Lights Off) 20.8 min  Total Sleep Time (TST): 296.4 min R Latency (from Sleep Onset): 25.5 min  Sleep Period Time: 375.9 min Total number of awakenings: 39  Wake during sleep: 79.5 min Wake After Sleep Onset (WASO): 79.5 min   Sleep Data:         Arousal Summary: Stage  Latency from lights out (min) Latency from sleep onset (min) Duration (min) % Total Sleep Time  Normal values  N 1 20.8 0.0 23.5 7.9 (5%)  N 2 23.3 2.5 237.9 80.3 (50%)  N 3       0.0 0.0 (20%)  R 46.3 25.5 35.0 11.8 (25%)    Number Index  Spontaneous 86 17.4  Apneas & Hypopneas 5 1.0  RERAs 3 0.6       (Apneas & Hypopneas & RERAs)  (8) (1.6)  Limb Movement 2 0.4  Snore 0 0.0  TOTAL 96 19.4      Respiratory Data:  CA OA MA Apnea Hypopnea* A+ H RERA Total  Number 3 0 0 3 35 38 3 41  Mean Dur (sec) 15.7 0.0 0.0 15.7 27.7 26.8 24.0 26.6  Max Dur (sec) 19.5 0.0 0.0 19.5 51.0 51.0 32.0 51.0  Total Dur (min) 0.8 0.0 0.0 0.8 16.2 16.9 1.2 18.1  % of TST 0.3 0.0 0.0 0.3 5.5 5.7 0.4 6.1  Index (#/h TST) 0.6 0.0 0.0 0.6 7.1 7.7 0.6 8.3  *Hypopneas scored based on 4% or greater desaturation.  Sleep Stage:         REM NREM TST  AHI 1.7 8.5 7.7  RDI 1.7 9.2 8.3    Sleep (min) TST (%) REM (min) NREM (min) CA (#) OA (#) MA (#) HYP (#) AHI (#/h) RERA (#) RDI (#/h) Desat (#)  Supine 188.7 63.66 28.2 160.5 2 0 0 31 10.5 2 11.1 48  Non-Supine 107.70 36.34 6.80 100.90 1.00 0.00 0.00 4.00 2.79 1.00 3.34 21.00  Left: 107.7 36.34 6.8 100.9 1 0 0 4 2.8 1 3.3 21     Snoring: Total number of snoring episodes  0  Total time with snoring    min (   % of sleep)   Oximetry Distribution:             WK REM NREM TOTAL  Average (%)   96 94 95 96  < 90% 0.2 0.0 0.0 0.2  < 80% 0.1 0.0 0.0 0.1  < 70% 0.1 0.0 0.0 0.1  # of Desaturations* 4 4 61 69  Desat Index (#/hour) 2.6 6.9 14.0 14.0  Desat Max (%) 14 5 9 14   Desat Max Dur (sec) 42.0 92.0 63.0 92.0  Approx  Min O2 during sleep 79  Approx min O2 during a respiratory event 79  Was Oxygen added (Y/N) and final rate No:   0 LPM  *Desaturations based on 3% or greater drop from baseline.   Cheyne Stokes Breathing: None Present   Heart Rate Summary:  Average Heart Rate During Sleep 59.9 bpm      Highest Heart Rate During Sleep (95th %) 66.0 bpm      Highest Heart Rate During Sleep 196 bpm (artifact)  Highest Heart Rate During Recording (TIB) 242 bpm (artifact)   Heart Rate Observations: Event Type #  Events   Bradycardia 0 Lowest HR Scored: N/A  Sinus Tachycardia During Sleep 0 Highest HR Scored: N/A  Narrow Complex Tachycardia 0 Highest HR Scored: N/A  Wide Complex Tachycardia 0 Highest HR Scored: N/A  Asystole 0 Longest Pause: N/A  Atrial Fibrillation 0 Duration Longest Event: N/A  Other Arrythmias  No Type:   Periodic Limb Movement Data: (Primary legs unless otherwise noted) Total # Limb Movement 106 Limb Movement Index 21.5  Total # PLMS 91 PLMS Index 18.4  Total # PLMS Arousals 2 PLMS Arousal Index 0.4  Percentage Sleep Time with PLMS 56.67min (19.0 % sleep)  Mean Duration limb movements (secs) 337.6   IPAP Level (cmH2O) EPAP Level (cmH2O) Total Duration (min) Sleep Duration (min) Sleep (%) REM (%) CA  #) OA # MA # HYP #) AHI (#/hr) RERAs # RERAs (#/hr) RDI (#/hr)  9 9 25.3 22.8 90.1 0.0 0 0 0 5 13.2 1 2.6 15.8  10 10  47.4 42.4 89.5 12.7 2 0 0 12 19.8 0 0.0 19.8  11 11  61.6 58.1 94.3 0.8 0 0 0 9 9.3 2 2.1 11.4  12 12  63.8 31.4 49.2 0.0 1 0 0 4 9.6 0 0.0 9.6  13 13  57.8 55.8 96.5 10.9 0 0 0 5 5.4 0 0.0 5.4  14 14  85.5 66.9 78.2 25.6 0 0 0 0 0.0 0 0.0 0.0  15 15 23.0 16.9 73.5 0.0 0 0 0 0 0.0 0 0.0 0.0

## 2021-02-25 ENCOUNTER — Other Ambulatory Visit: Payer: Self-pay | Admitting: Hospice and Palliative Medicine

## 2021-03-01 ENCOUNTER — Encounter: Payer: Self-pay | Admitting: Student in an Organized Health Care Education/Training Program

## 2021-03-01 ENCOUNTER — Telehealth: Payer: Self-pay

## 2021-03-01 ENCOUNTER — Encounter: Payer: Self-pay | Admitting: Hospice and Palliative Medicine

## 2021-03-01 ENCOUNTER — Other Ambulatory Visit: Payer: Self-pay | Admitting: Hospice and Palliative Medicine

## 2021-03-01 DIAGNOSIS — I83812 Varicose veins of left lower extremities with pain: Secondary | ICD-10-CM

## 2021-03-01 DIAGNOSIS — M79605 Pain in left leg: Secondary | ICD-10-CM

## 2021-03-01 NOTE — Telephone Encounter (Signed)
lmom to call us back

## 2021-03-01 NOTE — Telephone Encounter (Signed)
Spoke with that we put referral for vascular specialist and they will call him and make appt

## 2021-03-04 ENCOUNTER — Encounter: Payer: Self-pay | Admitting: Hospice and Palliative Medicine

## 2021-03-05 ENCOUNTER — Telehealth: Payer: Self-pay

## 2021-03-05 ENCOUNTER — Other Ambulatory Visit: Payer: Self-pay | Admitting: Hospice and Palliative Medicine

## 2021-03-05 ENCOUNTER — Ambulatory Visit (INDEPENDENT_AMBULATORY_CARE_PROVIDER_SITE_OTHER): Payer: Medicare HMO | Admitting: Physician Assistant

## 2021-03-05 ENCOUNTER — Other Ambulatory Visit: Payer: Self-pay

## 2021-03-05 ENCOUNTER — Encounter: Payer: Self-pay | Admitting: Physician Assistant

## 2021-03-05 DIAGNOSIS — G4733 Obstructive sleep apnea (adult) (pediatric): Secondary | ICD-10-CM

## 2021-03-05 DIAGNOSIS — G894 Chronic pain syndrome: Secondary | ICD-10-CM | POA: Diagnosis not present

## 2021-03-05 DIAGNOSIS — M79605 Pain in left leg: Secondary | ICD-10-CM | POA: Diagnosis not present

## 2021-03-05 DIAGNOSIS — G629 Polyneuropathy, unspecified: Secondary | ICD-10-CM

## 2021-03-05 DIAGNOSIS — I10 Essential (primary) hypertension: Secondary | ICD-10-CM | POA: Diagnosis not present

## 2021-03-05 DIAGNOSIS — Z6841 Body Mass Index (BMI) 40.0 and over, adult: Secondary | ICD-10-CM

## 2021-03-05 NOTE — Progress Notes (Signed)
[facility] [place], [place] [address]  Internal MEDICINE  Office Visit Note  Patient Name: Jesus Crosby  [id number]  [id number]  Date of Service: 03/05/2021  Chief Complaint  Patient presents with  . Acute Visit    Foot pain, left foot is swollen, skin is tight, red, painful, right foot also swollen a little      HPI Pt is here for a sick visit. -Pt has increased pain and swelling and redness in L foot. Prev DVT US negative.Thinks it is staying the same and is painful despite current pain medications. He has neuropathy and wonders if that is worsening. States it is from B6 but needs it for homocysteinuria  To get this under control and prevent risk of clots/bleeds. He also has a spinal cord stimulator and wears braces on both legs. -Followed by Dr. Zollie Scale with pain management and take oxycodone BID usually. -OSA--getting new machine, likely Bipap due to possible central apneas  Current Medication:  Outpatient Encounter Medications as of 03/05/2021  Medication Sig Note  . albuterol (VENTOLIN HFA) 108 (90 Base) MCG/ACT inhaler INHALE 2 PUFFS INTO THE LUNGS EVERY 6 HOURS AS NEEDED FOR WHEEZING OR SHORTNESS OF BREATH   . ALPRAZolam (XANAX) 0.5 MG tablet Take 0.5 mg by mouth 4 (four) times daily. By dr reddy 03/25/2014: .   Marland Kitchen amLODipine (NORVASC) 5 MG tablet Take one tab po bid   . aspirin EC 81 MG tablet Take 81 mg by mouth at bedtime.    Marland Kitchen atorvastatin (LIPITOR) 10 MG tablet Take 1 tablet (10 mg total) by mouth at bedtime.   . Betaine (CYSTADANE) POWD Take 8 Scoops by mouth 3 (three) times daily.   . clotrimazole (LOTRIMIN) 1 % cream APPLY EXTERNALLY TO THE AFFECTED AREA TWICE DAILY   . dicyclomine (BENTYL) 10 MG capsule TAKE 1 CAPSULE(10 MG) BY MOUTH FOUR TIMES DAILY BEFORE MEALS AND AT BEDTIME   . DULoxetine (CYMBALTA) 60 MG capsule Take 60 mg by mouth daily.   . ferrous gluconate (FERGON) 324 MG tablet Take 1 tablet (324 mg total) by mouth daily with  breakfast.   . Ferrous Sulfate Dried 45 MG TBCR Take 45 mg by mouth at bedtime.   . folic acid (FOLVITE) 858 MCG tablet Take 1,200 mcg by mouth at bedtime.    . furosemide (LASIX) 20 MG tablet TAKE 1 TABLET(20 MG) BY MOUTH DAILY   . gabapentin (NEURONTIN) 800 MG tablet Take 2 tablets by mouth 2 (two) times daily.    Marland Kitchen lidocaine (XYLOCAINE) 2 % solution Use as directed 15 mLs in the mouth or throat as needed for mouth pain.   Marland Kitchen losartan (COZAAR) 50 MG tablet TAKE 1/2 TABLET(25 MG) BY MOUTH AT BEDTIME   . metoprolol tartrate (LOPRESSOR) 50 MG tablet TAKE 1 AND 1/2 TABLETS BY MOUTH TWICE DAILY   . NONFORMULARY OR COMPOUNDED ITEM See pharmacy note   . oxyCODONE (OXY IR/ROXICODONE) 5 MG immediate release tablet oxycodone 5 mg tablet  TAKE 1 TABLET BY MOUTH EVERY 6 HOURS AS NEEDED FOR PAIN   . pantoprazole (PROTONIX) 40 MG tablet Take 1 tablet (40 mg total) by mouth daily.   Marland Kitchen pyridOXINE (VITAMIN B-6) 100 MG tablet Take 500 mg by mouth at bedtime.    . sertraline (ZOLOFT) 100 MG tablet Take 200 mg by mouth daily.    . Suvorexant 20 MG TABS Take 20 mg by mouth at bedtime.   . vitamin B-12 (CYANOCOBALAMIN) 1000 MCG tablet  Take 1,000 mcg by mouth at bedtime.    No facility-administered encounter medications on file as of 03/05/2021.      Medical History: Past Medical History:  Diagnosis Date  . Anxiety   . Arthritis    knees,Right wrist  . Avascular necrosis ([place])   . Brain bleed ([place])   . Cataract    bilateral repair with lens implants  . Depression   . Dysplastic nevus 03/22/2018    left mid to upper back paraspinal 2.5cm lat to spine -  DYSPLASTIC COMPOUND NEVUS WITH MILD ATYPIA, LIMITED MARGINS FREE  . Family history of adverse reaction to anesthesia    n/v-mom  . GERD (gastroesophageal reflux disease)   . Homocystinuria ([place])   . Hypertension   . Idiopathic progressive polyneuropathy   . Lens disease   . Neuromuscular disorder ([place])   . Obesity, Class II, BMI 35-39.9   . OCD  (obsessive compulsive disorder)   . Oxygen deficiency    2 liters  . Paresthesia 06/10/2015  . Plantar fasciitis   . Sleep apnea    cpap  . Stroke [facility])    with brain bleed at age 16-no deficits  . Umbilical hernia      Vital Signs: BP 126/68   Pulse 76   Temp 97.7 F (36.5 C)   Resp 16   Ht 6\' 3"  (1.905 m)   Wt (!) 366 lb 12.8 oz (166.4 kg)   SpO2 97%   BMI 45.85 kg/m    Review of Systems  Constitutional: Negative for fatigue and fever.  HENT: Negative for congestion, mouth sores and postnasal drip.   Respiratory: Negative for cough.   Cardiovascular: Positive for leg swelling. Negative for chest pain.  Genitourinary: Negative for flank pain.  Musculoskeletal: Positive for arthralgias and gait problem.       Pain in L foot along with neuropathy  Skin: Positive for color change.       L foot redness  Neurological: Positive for numbness.  Psychiatric/Behavioral: Negative.     Physical Exam Constitutional:      General: He is not in acute distress.    Appearance: He is well-developed. He is not diaphoretic.  HENT:     Head: Normocephalic and atraumatic.     Mouth/Throat:     Pharynx: No oropharyngeal exudate.  Eyes:     Pupils: Pupils are equal, round, and reactive to light.  Neck:     Thyroid: No thyromegaly.     Vascular: No JVD.     Trachea: No tracheal deviation.  Cardiovascular:     Rate and Rhythm: Normal rate and regular rhythm.     Heart sounds: Normal heart sounds. No murmur heard. No friction rub. No gallop.   Pulmonary:     Effort: Pulmonary effort is normal. No respiratory distress.     Breath sounds: No wheezing or rales.  Chest:     Chest wall: No tenderness.  Abdominal:     General: Bowel sounds are normal.     Palpations: Abdomen is soft.  Musculoskeletal:        General: Normal range of motion.     Cervical back: Normal range of motion and neck supple.     Left lower leg: Edema present.     Comments: Minimal edema in L foot   Lymphadenopathy:     Cervical: No cervical adenopathy.  Skin:    General: Skin is warm and dry.     Findings: Erythema present.  Comments: Chronic red color changes to L foot with pain and neuropathy  Neurological:     Mental Status: He is alert and oriented to person, place, and time.     Cranial Nerves: No cranial nerve deficit.     Sensory: Sensory deficit present.     Gait: Gait abnormal.     Comments: Neuropathy in both feet, walks with braces on legs   Psychiatric:        Behavior: Behavior normal.        Thought Content: Thought content normal.        Judgment: Judgment normal.       Assessment/Plan: 1. Acute pain of left lower extremity Previous US negative for DVT and ABI normal. Will obtain arterial doppler as well as lab work. - Uric acid - Sedimentation rate - US ARTERIAL LOWER EXTREMITY DUPLEX BILATERAL  2. Essential hypertension, benign Well controlled, continue norvasc, metoprolol, and lasix  3. Chronic pain syndrome Followed by pain management, will reach out to pain management about potential change to patch  4. OSA (obstructive sleep apnea) Pt awaiting new machine to change to bipap due to possible central apneas  5. Neuropathy Followed by neurology, likely secondary to chronic B6 use for homocystinuria   6. Morbid obesity with BMI of 45.0-49.9, adult ([place]) Obesity Counseling: Had a lengthy discussion regarding patients BMI and weight issues. Patient was instructed on portion control as well as increased activity. Also discussed caloric restrictions with trying to maintain intake less than 2000 Kcal. Discussions were made in accordance with the 5As of weight management. Simple actions such as not eating late and if able to, taking a walk is suggested.    General Counseling: federico maiorino understanding of the findings of todays visit and agrees with plan of treatment. I have discussed any further diagnostic evaluation that may be needed or ordered  today. We also reviewed his medications today. he has been encouraged to call the office with any questions or concerns that should arise related to todays visit.    Counseling:    Orders Placed This Encounter  Procedures  . US ARTERIAL LOWER EXTREMITY DUPLEX BILATERAL  . Uric acid  . Sedimentation rate    No orders of the defined types were placed in this encounter.   Time spent:30 Minutes

## 2021-03-05 NOTE — Telephone Encounter (Signed)
lmom to call us back need appt

## 2021-03-06 LAB — URIC ACID: Uric Acid: 3.5 mg/dL — ABNORMAL LOW (ref 3.8–8.4)

## 2021-03-06 LAB — SEDIMENTATION RATE: Sed Rate: 27 mm/hr (ref 0–30)

## 2021-03-08 ENCOUNTER — Telehealth: Payer: Self-pay | Admitting: Internal Medicine

## 2021-03-08 ENCOUNTER — Encounter: Payer: Self-pay | Admitting: Internal Medicine

## 2021-03-08 NOTE — Progress Notes (Signed)
  Chronic Care Management   Outreach Note  03/08/2021 Name: Jesus Crosby. MRN: [id number] DOB: 1966/07/23  Referred by: [name], MD Reason for referral : No chief complaint on file.   An unsuccessful telephone outreach was attempted today. The patient was referred to the pharmacist for assistance with care management and care coordination.   Follow Up Plan:   Carley Perdue [employer] Scheduler

## 2021-03-08 NOTE — Progress Notes (Signed)
  Chronic Care Management   Note  03/08/2021 Name: Jesus Crosby [facility]. MRN: [id number] DOB: 1966-11-17  [name] [name]. is a 55 y.o. year old male who is a primary care patient of [name], MD. I reached out to [employer]. by phone today in response to a referral sent by Mr. Jesus MERLOS Jr.'s PCP, [name], MD.   [name] was given information about Chronic Care Management services today including:  1. CCM service includes personalized support from designated clinical staff supervised by his physician, including individualized plan of care and coordination with other care providers 2. 24/7 contact phone numbers for assistance for urgent and routine care needs. 3. Service will only be billed when office clinical staff spend 20 minutes or more in a month to coordinate care. 4. Only one practitioner may furnish and bill the service in a calendar month. 5. The patient may stop CCM services at any time (effective at the end of the month) by phone call to the office staff.   Patient agreed to services and verbal consent obtained.   Follow up plan:   Carley Perdue [employer] Scheduler

## 2021-03-10 ENCOUNTER — Other Ambulatory Visit: Payer: Self-pay

## 2021-03-10 ENCOUNTER — Encounter: Payer: Self-pay | Admitting: Internal Medicine

## 2021-03-10 MED ORDER — ONDANSETRON HCL 8 MG PO TABS: 8.0000 mg | ORAL_TABLET | Freq: Two times a day (BID) | ORAL | 0 refills | Status: DC | PRN

## 2021-03-11 ENCOUNTER — Ambulatory Visit: Payer: Medicare HMO | Admitting: Physician Assistant

## 2021-03-12 ENCOUNTER — Encounter: Payer: Self-pay | Admitting: Internal Medicine

## 2021-03-15 ENCOUNTER — Encounter: Payer: Self-pay | Admitting: Internal Medicine

## 2021-03-15 NOTE — Telephone Encounter (Signed)
App tomorrow at 61

## 2021-03-16 ENCOUNTER — Other Ambulatory Visit: Payer: Self-pay | Admitting: Physician Assistant

## 2021-03-16 ENCOUNTER — Encounter: Payer: Self-pay | Admitting: Internal Medicine

## 2021-03-16 DIAGNOSIS — K602 Anal fissure, unspecified: Secondary | ICD-10-CM

## 2021-03-16 DIAGNOSIS — K625 Hemorrhage of anus and rectum: Secondary | ICD-10-CM

## 2021-03-16 DIAGNOSIS — K5900 Constipation, unspecified: Secondary | ICD-10-CM

## 2021-03-16 DIAGNOSIS — R109 Unspecified abdominal pain: Secondary | ICD-10-CM

## 2021-03-25 ENCOUNTER — Ambulatory Visit: Payer: Medicare HMO | Admitting: Physician Assistant

## 2021-03-26 ENCOUNTER — Telehealth: Payer: Self-pay

## 2021-03-26 NOTE — Telephone Encounter (Signed)
Pt called advises that he feels his feet are more red and swollen and he has appt with [place] on Thursday and I advised that if he feels it is worse that he should go to ER to be evaluated for DVT.  Also we offered Pt a sooner appt for Monday but pt wasn't sure what to do.  Pt is going to check with his parents and see about going to ER and will call us back to let us know decision.

## 2021-03-26 NOTE — Telephone Encounter (Signed)
Can You try calling [facility] again and leave a message for MD for mutual pt to call me

## 2021-03-28 ENCOUNTER — Other Ambulatory Visit: Payer: Self-pay | Admitting: Adult Health

## 2021-03-28 DIAGNOSIS — I10 Essential (primary) hypertension: Secondary | ICD-10-CM

## 2021-03-29 ENCOUNTER — Other Ambulatory Visit: Payer: Self-pay

## 2021-03-29 DIAGNOSIS — I10 Essential (primary) hypertension: Secondary | ICD-10-CM

## 2021-03-29 MED ORDER — LOSARTAN POTASSIUM 50 MG PO TABS: ORAL_TABLET | ORAL | 1 refills | Status: DC

## 2021-03-29 MED ORDER — METOPROLOL TARTRATE 50 MG PO TABS: 75.0000 mg | ORAL_TABLET | Freq: Two times a day (BID) | ORAL | 1 refills | Status: DC

## 2021-03-29 MED ORDER — FUROSEMIDE 20 MG PO TABS: ORAL_TABLET | ORAL | 1 refills | Status: AC

## 2021-03-31 DIAGNOSIS — H04123 Dry eye syndrome of bilateral lacrimal glands: Secondary | ICD-10-CM | POA: Diagnosis not present

## 2021-03-31 DIAGNOSIS — H33031 Retinal detachment with giant retinal tear, right eye: Secondary | ICD-10-CM | POA: Diagnosis not present

## 2021-03-31 DIAGNOSIS — Z8669 Personal history of other diseases of the nervous system and sense organs: Secondary | ICD-10-CM | POA: Diagnosis not present

## 2021-04-01 ENCOUNTER — Encounter: Payer: Self-pay | Admitting: Hospice and Palliative Medicine

## 2021-04-01 ENCOUNTER — Other Ambulatory Visit: Payer: Self-pay

## 2021-04-01 ENCOUNTER — Ambulatory Visit (INDEPENDENT_AMBULATORY_CARE_PROVIDER_SITE_OTHER): Payer: Medicare HMO | Admitting: Hospice and Palliative Medicine

## 2021-04-01 VITALS — BP 138/72 | HR 74 | Temp 98.0°F | Resp 16 | Ht 75.0 in | Wt 366.2 lb

## 2021-04-01 DIAGNOSIS — R300 Dysuria: Secondary | ICD-10-CM

## 2021-04-01 DIAGNOSIS — I517 Cardiomegaly: Secondary | ICD-10-CM

## 2021-04-01 DIAGNOSIS — I071 Rheumatic tricuspid insufficiency: Secondary | ICD-10-CM | POA: Diagnosis not present

## 2021-04-01 DIAGNOSIS — R5383 Other fatigue: Secondary | ICD-10-CM

## 2021-04-01 DIAGNOSIS — Z0001 Encounter for general adult medical examination with abnormal findings: Secondary | ICD-10-CM | POA: Diagnosis not present

## 2021-04-01 DIAGNOSIS — G894 Chronic pain syndrome: Secondary | ICD-10-CM

## 2021-04-01 DIAGNOSIS — Z6841 Body Mass Index (BMI) 40.0 and over, adult: Secondary | ICD-10-CM | POA: Diagnosis not present

## 2021-04-01 DIAGNOSIS — E7211 Homocystinuria: Secondary | ICD-10-CM

## 2021-04-01 DIAGNOSIS — I10 Essential (primary) hypertension: Secondary | ICD-10-CM

## 2021-04-01 MED ORDER — ONDANSETRON HCL 8 MG PO TABS: 8.0000 mg | ORAL_TABLET | Freq: Two times a day (BID) | ORAL | 0 refills | Status: DC | PRN

## 2021-04-01 NOTE — Progress Notes (Signed)
[facility] [address] [place], [place] [address]  Internal MEDICINE  Office Visit Note  Patient Name: Jesus Crosby  [id number]  [id number]  Date of Service: 04/07/2021  Chief Complaint  Patient presents with  . Medicare Wellness    Left leg is swollen   . Anxiety  . Arthritis  . Cataract  . Depression  . Gastroesophageal Reflux  . Hypertension     HPI Pt is here for routine health maintenance examination Working with Dr. Welton Flakes and pain management to optimize pain medication with possible long acting medication Scheduled for arterial US of lower extremity next week Tricuspid regurgitation on echocardiogram last year will need updating for monitoring Colonoscopy in 2020--recommend repeating in 2025 Due for fasting labs  Set up in about 2 weeks to get a new CPAP due to his current machine being on recall  Current Medication: Outpatient Encounter Medications as of 04/01/2021  Medication Sig Note  . albuterol (VENTOLIN HFA) 108 (90 Base) MCG/ACT inhaler INHALE 2 PUFFS INTO THE LUNGS EVERY 6 HOURS AS NEEDED FOR WHEEZING OR SHORTNESS OF BREATH   . ALPRAZolam (XANAX) 0.5 MG tablet Take 0.5 mg by mouth 4 (four) times daily. By dr reddy 03/25/2014: .   Marland Kitchen amLODipine (NORVASC) 5 MG tablet Take one tab po bid   . aspirin EC 81 MG tablet Take 81 mg by mouth at bedtime.    Marland Kitchen atorvastatin (LIPITOR) 10 MG tablet Take 1 tablet (10 mg total) by mouth at bedtime.   . Betaine (CYSTADANE) POWD Take 8 Scoops by mouth 3 (three) times daily.   . clotrimazole (LOTRIMIN) 1 % cream APPLY EXTERNALLY TO THE AFFECTED AREA TWICE DAILY   . dicyclomine (BENTYL) 10 MG capsule Take 1 capsule (10 mg total) by mouth 4 (four) times daily -  before meals and at bedtime. *Please call to make a follow up appointment for June   . DULoxetine (CYMBALTA) 60 MG capsule Take 60 mg by mouth daily.   . folic acid (FOLVITE) 400 MCG tablet Take 1,200 mcg by mouth at bedtime.    . furosemide (LASIX) 20 MG  tablet TAKE 1 TABLET(20 MG) BY MOUTH DAILY   . gabapentin (NEURONTIN) 800 MG tablet Take 2 tablets by mouth 2 (two) times daily.    Marland Kitchen lidocaine (XYLOCAINE) 2 % solution Use as directed 15 mLs in the mouth or throat as needed for mouth pain.   Marland Kitchen losartan (COZAAR) 50 MG tablet TAKE 1/2 TABLET(25 MG) BY MOUTH AT BEDTIME   . metoprolol tartrate (LOPRESSOR) 50 MG tablet Take 1.5 tablets (75 mg total) by mouth 2 (two) times daily.   . NONFORMULARY OR COMPOUNDED ITEM See pharmacy note   . oxyCODONE (OXY IR/ROXICODONE) 5 MG immediate release tablet oxycodone 5 mg tablet  TAKE 1 TABLET BY MOUTH EVERY 6 HOURS AS NEEDED FOR PAIN   . pantoprazole (PROTONIX) 40 MG tablet Take 1 tablet (40 mg total) by mouth daily.   Marland Kitchen pyridOXINE (VITAMIN B-6) 100 MG tablet Take 500 mg by mouth at bedtime.    . sertraline (ZOLOFT) 100 MG tablet Take 200 mg by mouth daily.    . Suvorexant 20 MG TABS Take 20 mg by mouth at bedtime.   . vitamin B-12 (CYANOCOBALAMIN) 1000 MCG tablet Take 1,000 mcg by mouth at bedtime.   . [DISCONTINUED] ondansetron (ZOFRAN) 8 MG tablet Take 1 tablet (8 mg total) by mouth 2 (two) times daily as needed for nausea or vomiting.   . [EXPIRED]  Alpha-Lipoic Acid 600 MG CAPS Take 1 capsule (600 mg total) by mouth 2 (two) times daily.   . ferrous gluconate (FERGON) 324 MG tablet Take 1 tablet (324 mg total) by mouth daily with breakfast.   . Ferrous Sulfate Dried 45 MG TBCR Take 45 mg by mouth at bedtime.   . ondansetron (ZOFRAN) 8 MG tablet Take 1 tablet (8 mg total) by mouth 2 (two) times daily as needed for nausea or vomiting.   . [DISCONTINUED] amLODipine (NORVASC) 5 MG tablet Take one tab po bid   . [DISCONTINUED] cycloSPORINE (RESTASIS) 0.05 % ophthalmic emulsion Place 2 drops into both eyes 2 (two) times daily. Pt need 90 days ok to fill   . [DISCONTINUED] dicyclomine (BENTYL) 10 MG capsule TAKE 1 CAPSULE(10 MG) BY MOUTH FOUR TIMES DAILY BEFORE MEALS AND AT BEDTIME   . [DISCONTINUED] furosemide  (LASIX) 20 MG tablet TAKE 1 TABLET(20 MG) BY MOUTH DAILY   . [DISCONTINUED] losartan (COZAAR) 50 MG tablet TAKE 1/2 TABLET(25 MG) BY MOUTH AT BEDTIME   . [DISCONTINUED] metoprolol tartrate (LOPRESSOR) 50 MG tablet TAKE 1 AND 1/2 TABLETS BY MOUTH TWICE DAILY    No facility-administered encounter medications on file as of 04/01/2021.    Surgical History: Past Surgical History:  Procedure Laterality Date  . COLONOSCOPY  2020  . COLONOSCOPY WITH PROPOFOL N/A 07/30/2015   Procedure: COLONOSCOPY WITH PROPOFOL;  Surgeon: [name], MD;  Location: [facility];  Service: Endoscopy;  Laterality: N/A;  . DERMABRASION OF FACE     due to acne scars  . distal radius fracture of right hand (plates, screws, and pins)  2011  . ESOPHAGOGASTRODUODENOSCOPY (EGD) WITH PROPOFOL N/A 07/30/2015   Procedure: ESOPHAGOGASTRODUODENOSCOPY (EGD) WITH PROPOFOL;  Surgeon: [name], MD;  Location: [facility];  Service: Endoscopy;  Laterality: N/A;  . EYE SURGERY     LASER + SURG BIL   . GAS/FLUID EXCHANGE Right 09/17/2013   Procedure: GAS/FLUID EXCHANGE;  Surgeon: Hayden Pedro, MD;  Location: [facility];  Service: Ophthalmology;  Laterality: Right;  . gastric sleeve surgery  08/17/2016  . HIP ARTHROPLASTY Left   . INTRAOCULAR LENS INSERTION Bilateral    lens disease due to homocysteinuria  . JOINT REPLACEMENT Left    THR  . NASAL SEPTUM SURGERY  march 2016   @ [facility]  . PARS PLANA VITRECTOMY Right 09/17/2013   Procedure: PARS PLANA VITRECTOMY WITH 25G REMOVAL/SUTURE SECONDARY INTRAOCULAR LENS;  Surgeon: Hayden Pedro, MD;  Location: [facility];  Service: Ophthalmology;  Laterality: Right;  . PHOTOCOAGULATION Right 09/17/2013   Procedure: PHOTOCOAGULATION;  Surgeon: Hayden Pedro, MD;  Location: [facility];  Service: Ophthalmology;  Laterality: Right;  HEADSCOPE LASER  . PULSE GENERATOR IMPLANT N/A 02/13/2019   Procedure: UNILATERAL PULSE GENERATOR IMPLANT;  Surgeon: Meade Maw, MD;  Location: [facility];   Service: Neurosurgery;  Laterality: N/A;  . scapholunate ligament reconstruction of right hand  2011  . UMBILICAL HERNIA REPAIR N/A 05/07/2015   Procedure: HERNIA REPAIR UMBILICAL ADULT;  Surgeon: Florene Glen, MD;  Location: [facility];  Service: General;  Laterality: N/A;  . UPPER GASTROINTESTINAL ENDOSCOPY  2012  . WRIST FUSION Left   . WRIST SURGERY Right     Medical History: Past Medical History:  Diagnosis Date  . Anxiety   . Arthritis    knees,Right wrist  . Avascular necrosis ([place])   . Brain bleed ([place])   . Cataract    bilateral repair with lens implants  .  Depression   . Dysplastic nevus 03/22/2018    left mid to upper back paraspinal 2.5cm lat to spine -  DYSPLASTIC COMPOUND NEVUS WITH MILD ATYPIA, LIMITED MARGINS FREE  . Family history of adverse reaction to anesthesia    n/v-mom  . GERD (gastroesophageal reflux disease)   . 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 Hypertension   . Idiopathic progressive polyneuropathy   . Lens disease   . Neuromuscular disorder ([place])   . Obesity, Class II, BMI 35-39.9   . OCD (obsessive compulsive disorder)   . Oxygen deficiency    2 liters  . Paresthesia 06/10/2015  . Plantar fasciitis   . Sleep apnea    cpap  . Stroke [facility])    with brain bleed at age 31-no deficits  . Umbilical hernia     Family History: Family History  Problem Relation Age of Onset  . Breast cancer Mother   . Hypertension Mother   . Colon polyps Mother   . Hypertension Father   . Colon polyps Father   . Stroke Maternal Grandmother   . Stroke Maternal Grandfather   . Stroke Paternal Grandmother   . Stroke Paternal Grandfather   . Stroke Other   . Colon cancer Neg Hx   . Esophageal cancer Neg Hx   . Rectal cancer Neg Hx   . Stomach cancer Neg Hx       Review of Systems  Constitutional: Negative for chills, fatigue and unexpected weight change.  HENT: Negative for congestion, postnasal drip, rhinorrhea, sneezing and sore throat.   Eyes: Negative for  redness.  Respiratory: Negative for cough, chest tightness and shortness of breath.   Cardiovascular: Negative for chest pain and palpitations.  Gastrointestinal: Negative for abdominal pain, constipation, diarrhea, nausea and vomiting.  Genitourinary: Negative for dysuria and frequency.  Musculoskeletal: Negative for arthralgias, back pain, joint swelling and neck pain.  Skin: Negative for rash.  Neurological: Negative for tremors and numbness.  Hematological: Negative for adenopathy. Does not bruise/bleed easily.  Psychiatric/Behavioral: Negative for behavioral problems (Depression), sleep disturbance and suicidal ideas. The patient is not nervous/anxious.      Vital Signs: BP 138/72   Pulse 74   Temp 98 F (36.7 C)   Resp 16   Ht 6\' 3"  (1.905 m)   Wt (!) 366 lb 3.2 oz (166.1 kg)   SpO2 90%   BMI 45.77 kg/m    Physical Exam Vitals reviewed.  Constitutional:      Appearance: Normal appearance. He is obese.  Cardiovascular:     Rate and Rhythm: Normal rate and regular rhythm.     Pulses: Normal pulses.     Heart sounds: Normal heart sounds.  Pulmonary:     Effort: Pulmonary effort is normal.     Breath sounds: Normal breath sounds.  Abdominal:     General: Abdomen is flat.     Palpations: Abdomen is soft.  Musculoskeletal:        General: Normal range of motion.     Cervical back: Normal range of motion.  Skin:    General: Skin is warm.  Neurological:     General: No focal deficit present.     Mental Status: He is alert and oriented to person, place, and time. Mental status is at baseline.  Psychiatric:        Mood and Affect: Mood normal.        Behavior: Behavior normal.        Thought Content: Thought content normal.  Judgment: Judgment normal.      LABS: Recent Results (from the past 2160 hour(s))  Uric acid     Status: Abnormal   Collection Time: 03/05/21 10:45 AM  Result Value Ref Range   Uric Acid 3.5 (L) 3.8 - 8.4 mg/dL    Comment:             Therapeutic target for gout patients: <6.0  Sedimentation rate     Status: None   Collection Time: 03/05/21 10:45 AM  Result Value Ref Range   Sed Rate 27 0 - 30 mm/hr  UA/M w/rflx Culture, Routine     Status: None   Collection Time: 04/01/21 10:40 AM   Specimen: Urine   Urine  Result Value Ref Range   Specific Gravity, UA 1.011 1.005 - 1.030   pH, UA 6.0 5.0 - 7.5   Color, UA Yellow Yellow   Appearance Ur Clear Clear   Leukocytes,UA Negative Negative   Protein,UA Negative Negative/Trace   Glucose, UA Negative Negative   Ketones, UA Negative Negative   RBC, UA Negative Negative   Bilirubin, UA Negative Negative   Urobilinogen, Ur 0.2 0.2 - 1.0 mg/dL   Nitrite, UA Negative Negative   Microscopic Examination Comment     Comment: Microscopic follows if indicated.   Microscopic Examination See below:     Comment: Microscopic was indicated and was performed.   Urinalysis Reflex Comment     Comment: This specimen will not reflex to a Urine Culture.  Microscopic Examination     Status: None   Collection Time: 04/01/21 10:40 AM   Urine  Result Value Ref Range   WBC, UA None seen 0 - 5 /hpf   RBC None seen 0 - 2 /hpf   Epithelial Cells (non renal) None seen 0 - 10 /hpf   Casts None seen None seen /lpf   Bacteria, UA None seen None seen/Few    Assessment/Plan: 1. Encounter for routine adult health examination with abnormal findings Well appearing 55 year old male Up to date on PHM Will review routine labs and adjust plan of care as indicated  2. Tricuspid valve insufficiency, unspecified etiology Update echocardiogram to monitor for TR and underlying chronic conditions - ECHOCARDIOGRAM COMPLETE; Future  3. Essential hypertension, benign BP and HR remain well controlled, continue to monitor  4. Chronic pain syndrome Working with pain management provider to optimize therapy Requesting refills of Zofran to take as needed for nausea related to chronic opioid use -  ondansetron (ZOFRAN) 8 MG tablet; Take 1 tablet (8 mg total) by mouth 2 (two) times daily as needed for nausea or vomiting.  Dispense: 20 tablet; Refill: 0  5. Morbid obesity with BMI of 45.0-49.9, adult (HCC) Obesity Counseling: Risk Assessment: An assessment of behavioral risk factors was made today and includes lack of exercise sedentary lifestyle, lack of portion control and poor dietary habits.  Risk Modification Advice: She was counseled on portion control guidelines. Restricting daily caloric intake to 1800. The detrimental long term effects of obesity on her health and ongoing poor compliance was also discussed with the patient.  6. Other fatigue - CBC w/Diff/Platelet - Comprehensive Metabolic Panel (CMET) - Lipid Panel With LDL/HDL Ratio - TSH + free T4 - PSA Total (Reflex To Free)  7. Dysuria - UA/M w/rflx Culture, Routine - Microscopic Examination  General Counseling: tony schubring understanding of the findings of todays visit and agrees with plan of treatment. I have discussed any further diagnostic evaluation that may  be needed or ordered today. We also reviewed his medications today. he has been encouraged to call the office with any questions or concerns that should arise related to todays visit.    Counseling:    Orders Placed This Encounter  Procedures  . Microscopic Examination  . UA/M w/rflx Culture, Routine  . CBC w/Diff/Platelet  . Comprehensive Metabolic Panel (CMET)  . Lipid Panel With LDL/HDL Ratio  . TSH + free T4  . PSA Total (Reflex To Free)  . ECHOCARDIOGRAM COMPLETE    Meds ordered this encounter  Medications  . ondansetron (ZOFRAN) 8 MG tablet    Sig: Take 1 tablet (8 mg total) by mouth 2 (two) times daily as needed for nausea or vomiting.    Dispense:  20 tablet    Refill:  0    Total time spent: 30 Minutes  Time spent includes review of chart, medications, test results, and follow up plan with the patient.   This patient was seen by  Theodoro Grist AGNP-C Collaboration with Dr [name] as a part of collaborative care agreement   Tanna Furry. [facility] Internal Medicine

## 2021-04-02 LAB — UA/M W/RFLX CULTURE, ROUTINE
Bilirubin, UA: NEGATIVE
Glucose, UA: NEGATIVE
Ketones, UA: NEGATIVE
Leukocytes,UA: NEGATIVE
Nitrite, UA: NEGATIVE
Protein,UA: NEGATIVE
RBC, UA: NEGATIVE
Specific Gravity, UA: 1.011 (ref 1.005–1.030)
Urobilinogen, Ur: 0.2 mg/dL (ref 0.2–1.0)
pH, UA: 6 (ref 5.0–7.5)

## 2021-04-02 LAB — MICROSCOPIC EXAMINATION
Bacteria, UA: NONE SEEN
Casts: NONE SEEN /lpf
Epithelial Cells (non renal): NONE SEEN /hpf (ref 0–10)
RBC, Urine: NONE SEEN /hpf (ref 0–2)
WBC, UA: NONE SEEN /hpf (ref 0–5)

## 2021-04-07 ENCOUNTER — Other Ambulatory Visit: Payer: Self-pay

## 2021-04-07 ENCOUNTER — Ambulatory Visit: Payer: Medicare HMO

## 2021-04-07 ENCOUNTER — Encounter: Payer: Self-pay | Admitting: Hospice and Palliative Medicine

## 2021-04-07 DIAGNOSIS — M79605 Pain in left leg: Secondary | ICD-10-CM | POA: Diagnosis not present

## 2021-04-07 DIAGNOSIS — R5383 Other fatigue: Secondary | ICD-10-CM | POA: Diagnosis not present

## 2021-04-07 DIAGNOSIS — R972 Elevated prostate specific antigen [PSA]: Secondary | ICD-10-CM | POA: Diagnosis not present

## 2021-04-08 LAB — CBC WITH DIFFERENTIAL/PLATELET
Basophils Absolute: 0 10*3/uL (ref 0.0–0.2)
Basos: 0 %
EOS (ABSOLUTE): 0.1 10*3/uL (ref 0.0–0.4)
Eos: 2 %
Hematocrit: 40.6 % (ref 37.5–51.0)
Hemoglobin: 13.7 g/dL (ref 13.0–17.7)
Immature Grans (Abs): 0 10*3/uL (ref 0.0–0.1)
Immature Granulocytes: 0 %
Lymphocytes Absolute: 1.7 10*3/uL (ref 0.7–3.1)
Lymphs: 25 %
MCH: 31.1 pg (ref 26.6–33.0)
MCHC: 33.7 g/dL (ref 31.5–35.7)
MCV: 92 fL (ref 79–97)
Monocytes Absolute: 0.7 10*3/uL (ref 0.1–0.9)
Monocytes: 10 %
Neutrophils Absolute: 4.3 10*3/uL (ref 1.4–7.0)
Neutrophils: 63 %
Platelets: 190 10*3/uL (ref 150–450)
RBC: 4.41 x10E6/uL (ref 4.14–5.80)
RDW: 13.2 % (ref 11.6–15.4)
WBC: 6.9 10*3/uL (ref 3.4–10.8)

## 2021-04-08 LAB — PSA TOTAL (REFLEX TO FREE): Prostate Specific Ag, Serum: 0.7 ng/mL (ref 0.0–4.0)

## 2021-04-08 LAB — COMPREHENSIVE METABOLIC PANEL
ALT: 47 IU/L — ABNORMAL HIGH (ref 0–44)
AST: 36 IU/L (ref 0–40)
Albumin/Globulin Ratio: 1.2 (ref 1.2–2.2)
Albumin: 4 g/dL (ref 3.8–4.9)
Alkaline Phosphatase: 85 IU/L (ref 44–121)
BUN/Creatinine Ratio: 11 (ref 9–20)
BUN: 11 mg/dL (ref 6–24)
Bilirubin Total: 0.3 mg/dL (ref 0.0–1.2)
CO2: 25 mmol/L (ref 20–29)
Calcium: 9.1 mg/dL (ref 8.7–10.2)
Chloride: 94 mmol/L — ABNORMAL LOW (ref 96–106)
Creatinine, Ser: 0.97 mg/dL (ref 0.76–1.27)
Globulin, Total: 3.4 g/dL (ref 1.5–4.5)
Glucose: 99 mg/dL (ref 65–99)
Potassium: 4.4 mmol/L (ref 3.5–5.2)
Sodium: 134 mmol/L (ref 134–144)
Total Protein: 7.4 g/dL (ref 6.0–8.5)
eGFR: 93 mL/min/{1.73_m2} (ref >59)

## 2021-04-08 LAB — LIPID PANEL WITH LDL/HDL RATIO
Cholesterol, Total: 195 mg/dL (ref 100–199)
HDL: 40 mg/dL (ref >39)
LDL Chol Calc (NIH): 127 mg/dL — ABNORMAL HIGH (ref 0–99)
LDL/HDL Ratio: 3.2 ratio (ref 0.0–3.6)
Triglycerides: 155 mg/dL — ABNORMAL HIGH (ref 0–149)
VLDL Cholesterol Cal: 28 mg/dL (ref 5–40)

## 2021-04-08 LAB — TSH+FREE T4
Free T4: 1.15 ng/dL (ref 0.82–1.77)
TSH: 1.27 u[IU]/mL (ref 0.450–4.500)

## 2021-04-08 NOTE — Progress Notes (Signed)
Labs reviewed, will be discussed at upcoming visit.

## 2021-04-12 ENCOUNTER — Ambulatory Visit: Payer: Medicare HMO | Admitting: Dermatology

## 2021-04-12 ENCOUNTER — Other Ambulatory Visit: Payer: Self-pay

## 2021-04-12 DIAGNOSIS — D489 Neoplasm of uncertain behavior, unspecified: Secondary | ICD-10-CM

## 2021-04-12 DIAGNOSIS — L919 Hypertrophic disorder of the skin, unspecified: Secondary | ICD-10-CM | POA: Diagnosis not present

## 2021-04-12 DIAGNOSIS — L814 Other melanin hyperpigmentation: Secondary | ICD-10-CM | POA: Diagnosis not present

## 2021-04-12 DIAGNOSIS — L578 Other skin changes due to chronic exposure to nonionizing radiation: Secondary | ICD-10-CM | POA: Diagnosis not present

## 2021-04-12 DIAGNOSIS — D1800 Hemangioma unspecified site: Secondary | ICD-10-CM | POA: Diagnosis not present

## 2021-04-12 DIAGNOSIS — Z86018 Personal history of other benign neoplasm: Secondary | ICD-10-CM

## 2021-04-12 DIAGNOSIS — Z1283 Encounter for screening for malignant neoplasm of skin: Secondary | ICD-10-CM

## 2021-04-12 DIAGNOSIS — L821 Other seborrheic keratosis: Secondary | ICD-10-CM

## 2021-04-12 DIAGNOSIS — L570 Actinic keratosis: Secondary | ICD-10-CM | POA: Diagnosis not present

## 2021-04-12 DIAGNOSIS — L918 Other hypertrophic disorders of the skin: Secondary | ICD-10-CM

## 2021-04-12 NOTE — Progress Notes (Signed)
Follow-Up Visit   Subjective  Jesus Crosby. is a 55 y.o. male who presents for the following: irritated skin tag (Patient here today concerning on irritated skin tag on right side. He states part of skin tag came off a few weeks ago and not sure if he needs treatment for it. He has some spots on back he would like checked today.  ). The patient presents for Upper Body Skin Exam (UBSE) for skin cancer screening and mole check.  The following portions of the chart were reviewed this encounter and updated as appropriate:  Tobacco  Allergies  Meds  Problems  Med Hx  Surg Hx  Fam Hx     rigth side antior posterior 2 biopsy irritated lesions skin tag/ rule out dysplasia   Objective  Well appearing patient in no apparent distress; mood and affect are within normal limits.  All skin waist up examined.  Objective  right anterior side: 0.7 cm irritated tag  Objective  right posterior side: 0.5 cm irritated tag   Objective  scalp x 4 (4): Erythematous thin papules/macules with gritty scale.   Assessment & Plan  Neoplasm of uncertain behavior (2) right anterior side  Epidermal / dermal shaving  Lesion diameter (cm):  0.7 Informed consent: discussed and consent obtained   Timeout: patient name, date of birth, surgical site, and procedure verified   Procedure prep:  Patient was prepped and draped in usual sterile fashion Prep type:  Isopropyl alcohol Anesthesia: the lesion was anesthetized in a standard fashion   Anesthetic:  1% lidocaine w/ epinephrine 1-100,000 buffered w/ 8.4% NaHCO3 Hemostasis achieved with: pressure, aluminum chloride and electrodesiccation   Outcome: patient tolerated procedure well   Post-procedure details: sterile dressing applied and wound care instructions given   Dressing type: bandage and petrolatum    Specimen 1 - Surgical pathology Differential Diagnosis: R/O Irritated tag vs Atypia   Check Margins: No 0.7 cm irritated tag rule out  dysplasia  right posterior side  Epidermal / dermal shaving  Lesion diameter (cm):  0.5 Informed consent: discussed and consent obtained   Timeout: patient name, date of birth, surgical site, and procedure verified   Procedure prep:  Patient was prepped and draped in usual sterile fashion Prep type:  Isopropyl alcohol Anesthesia: the lesion was anesthetized in a standard fashion   Anesthetic:  1% lidocaine w/ epinephrine 1-100,000 buffered w/ 8.4% NaHCO3 Hemostasis achieved with: pressure, aluminum chloride and electrodesiccation   Outcome: patient tolerated procedure well   Post-procedure details: sterile dressing applied and wound care instructions given   Dressing type: bandage and petrolatum    Specimen 2 - Surgical pathology Differential Diagnosis: R/O irritated tag vs Atypia   Check Margins: No 0.5 cm irritated tag rule out dysplasia  Irritated lesions   R/o skin tag vs dysplasia   AK (actinic keratosis) (4) scalp x 4  Destruction of lesion - scalp x 4 Complexity: simple   Destruction method: cryotherapy   Informed consent: discussed and consent obtained   Timeout:  patient name, date of birth, surgical site, and procedure verified Lesion destroyed using liquid nitrogen: Yes   Region frozen until ice ball extended beyond lesion: Yes   Outcome: patient tolerated procedure well with no complications   Post-procedure details: wound care instructions given    Skin cancer screening  Hemangiomas - Red papules - Discussed benign nature - Observe - Call for any changes  Seborrheic Keratoses - Stuck-on, waxy, tan-brown papules and/or plaques  -  Benign-appearing - Discussed benign etiology and prognosis. - Observe - Call for any changes  Acrochordons (Skin Tags) - Fleshy, skin-colored pedunculated papules - Benign appearing.  - Observe. - If desired, they can be removed with an in office procedure that is not covered by insurance. - Please call the clinic if  you notice any new or changing lesions.  Lentigines - Scattered tan macules - Due to sun exposure - Benign-appering, observe - Recommend daily broad spectrum sunscreen SPF 30+ to sun-exposed areas, reapply every 2 hours as needed. - Call for any changes  Actinic Damage - chronic, secondary to cumulative UV radiation exposure/sun exposure over time - diffuse scaly erythematous macules with underlying dyspigmentation - Recommend daily broad spectrum sunscreen SPF 30+ to sun-exposed areas, reapply every 2 hours as needed.  - Recommend staying in the shade or wearing long sleeves, sun glasses (UVA+UVB protection) and wide brim hats (4-inch brim around the entire circumference of the hat). - Call for new or changing lesions.  Return in about 1 year (around 04/12/2022) for TBSE.   IRuthell Rummage, CMA, am acting as scribe for Sarina Ser, MD.  Documentation: I have reviewed the above documentation for accuracy and completeness, and I agree with the above.  Sarina Ser, MD

## 2021-04-12 NOTE — Patient Instructions (Signed)

## 2021-04-13 ENCOUNTER — Ambulatory Visit (INDEPENDENT_AMBULATORY_CARE_PROVIDER_SITE_OTHER): Payer: Medicare HMO | Admitting: Internal Medicine

## 2021-04-13 ENCOUNTER — Encounter: Payer: Self-pay | Admitting: Dermatology

## 2021-04-13 ENCOUNTER — Encounter: Payer: Self-pay | Admitting: Internal Medicine

## 2021-04-13 VITALS — BP 140/86 | HR 59 | Temp 98.3°F | Resp 16 | Ht 75.0 in | Wt 362.2 lb

## 2021-04-13 DIAGNOSIS — G5793 Unspecified mononeuropathy of bilateral lower limbs: Secondary | ICD-10-CM | POA: Diagnosis not present

## 2021-04-13 DIAGNOSIS — G894 Chronic pain syndrome: Secondary | ICD-10-CM | POA: Diagnosis not present

## 2021-04-13 DIAGNOSIS — Z6841 Body Mass Index (BMI) 40.0 and over, adult: Secondary | ICD-10-CM | POA: Diagnosis not present

## 2021-04-13 DIAGNOSIS — I10 Essential (primary) hypertension: Secondary | ICD-10-CM

## 2021-04-13 DIAGNOSIS — R634 Abnormal weight loss: Secondary | ICD-10-CM | POA: Diagnosis not present

## 2021-04-13 MED ORDER — FENTANYL 12 MCG/HR TD PT72: 1.0000 | MEDICATED_PATCH | TRANSDERMAL | 0 refills | Status: DC

## 2021-04-13 NOTE — Progress Notes (Signed)
[facility] [place], [place] [address]  Internal MEDICINE  Office Visit Note  Patient Name: Degan Hanser  [id number]  [id number]  Date of Service: 04/21/2021  Chief Complaint  Patient presents with  . Edema    In legs  . Medication Management    Medications from Ortho  . Results    Review [place] results for blood clots  . Pain    In both feet, left is more swollen and is hurting worse    HPI  Mr. Esquivias is a 55 year old male has progressive neuropathy. He first noticed symptoms in his bilateral lower extremities around 2016 mostly of numbness and tingling and slowly worsened over the years. In 2019 they became more prominent and at this time he had a EMG performed at [facility] that showed sensorimotor neuropathy. He continues to have neuropathic pain in his lower extremities as well as now having numbness in the right first 3 digits. 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  This neuropathy is thought to be either due to homocystinuria or the use of excessive vitamins however we will review his medical records to get detailed history of his disease Patient continues to have severe pain in his bilateral lower extremities and has been followed by neurology neurosurgery and orthopedic.  Patient also has been on chronic narcotics for pain control I did spoke with PA from orthopedic office about his pain control and might need evaluation for complex regional pain syndrome His other medical problems include obstructive sleep apnea, he is in the process of getting CPAP machine Patient is also interested in weight loss program here at [place].  He will be screened and evaluated for possible candidate to enroll in this program  Current Medication: Outpatient Encounter Medications as of 04/13/2021  Medication Sig Note  . ALPRAZolam (XANAX) 0.5 MG tablet Take 0.5 mg by mouth 4 (four) times  daily. By dr reddy 03/25/2014: .   Marland Kitchen amLODipine (NORVASC) 5 MG tablet Take one tab po bid   . aspirin EC 81 MG tablet Take 81 mg by mouth at bedtime.    Marland Kitchen atorvastatin (LIPITOR) 10 MG tablet Take 1 tablet (10 mg total) by mouth at bedtime.   . Betaine (CYSTADANE) POWD Take 8 Scoops by mouth 3 (three) times daily.   . clotrimazole (LOTRIMIN) 1 % cream APPLY EXTERNALLY TO THE AFFECTED AREA TWICE DAILY   . dicyclomine (BENTYL) 10 MG capsule Take 1 capsule (10 mg total) by mouth 4 (four) times daily -  before meals and at bedtime. *Please call to make a follow up appointment for June   . DULoxetine (CYMBALTA) 60 MG capsule Take 60 mg by mouth daily.   . fentaNYL (DURAGESIC) 12 MCG/HR Place 1 patch onto the skin every 3 (three) days. For chronic and severe pain   . folic acid (FOLVITE) 678 MCG tablet Take 1,200 mcg by mouth at bedtime.    . furosemide (LASIX) 20 MG tablet TAKE 1 TABLET(20 MG) BY MOUTH DAILY   . gabapentin (NEURONTIN) 800 MG tablet Take 2 tablets by mouth 2 (two) times daily.    Marland Kitchen losartan (COZAAR) 50 MG tablet TAKE 1/2 TABLET(25 MG) BY MOUTH AT BEDTIME   . metoprolol tartrate (LOPRESSOR) 50 MG tablet Take 1.5 tablets (75 mg total) by mouth 2 (two) times daily.   . NONFORMULARY OR COMPOUNDED ITEM See pharmacy note   .  ondansetron (ZOFRAN) 8 MG tablet Take 1 tablet (8 mg total) by mouth 2 (two) times daily as needed for nausea or vomiting.   Marland Kitchen oxyCODONE (OXY IR/ROXICODONE) 5 MG immediate release tablet oxycodone 5 mg tablet  TAKE 1 TABLET BY MOUTH EVERY 6 HOURS AS NEEDED FOR PAIN   . pantoprazole (PROTONIX) 40 MG tablet Take 1 tablet (40 mg total) by mouth daily.   Marland Kitchen pyridOXINE (VITAMIN B-6) 100 MG tablet Take 500 mg by mouth at bedtime.    . sertraline (ZOLOFT) 100 MG tablet Take 200 mg by mouth daily.    . vitamin B-12 (CYANOCOBALAMIN) 1000 MCG tablet Take 1,000 mcg by mouth at bedtime.   . [DISCONTINUED] albuterol (VENTOLIN HFA) 108 (90 Base) MCG/ACT inhaler INHALE 2 PUFFS INTO THE  LUNGS EVERY 6 HOURS AS NEEDED FOR WHEEZING OR SHORTNESS OF BREATH   . [DISCONTINUED] lidocaine (XYLOCAINE) 2 % solution Use as directed 15 mLs in the mouth or throat as needed for mouth pain.   . [DISCONTINUED] Suvorexant 20 MG TABS Take 20 mg by mouth at bedtime. (Patient not taking: Reported on 04/20/2021)   . ferrous gluconate (FERGON) 324 MG tablet Take 1 tablet (324 mg total) by mouth daily with breakfast.   . Ferrous Sulfate Dried 45 MG TBCR Take 45 mg by mouth at bedtime.    No facility-administered encounter medications on file as of 04/13/2021.    Surgical History: Past Surgical History:  Procedure Laterality Date  . COLONOSCOPY  2020  . COLONOSCOPY WITH PROPOFOL N/A 07/30/2015   Procedure: COLONOSCOPY WITH PROPOFOL;  Surgeon: [name], MD;  Location: [facility];  Service: Endoscopy;  Laterality: N/A;  . DERMABRASION OF FACE     due to acne scars  . distal radius fracture of right hand (plates, screws, and pins)  2011  . ESOPHAGOGASTRODUODENOSCOPY (EGD) WITH PROPOFOL N/A 07/30/2015   Procedure: ESOPHAGOGASTRODUODENOSCOPY (EGD) WITH PROPOFOL;  Surgeon: [name], MD;  Location: [facility];  Service: Endoscopy;  Laterality: N/A;  . EYE SURGERY     LASER + SURG BIL   . GAS/FLUID EXCHANGE Right 09/17/2013   Procedure: GAS/FLUID EXCHANGE;  Surgeon: Hayden Pedro, MD;  Location: [place];  Service: Ophthalmology;  Laterality: Right;  . gastric sleeve surgery  08/17/2016  . HIP ARTHROPLASTY Left   . INTRAOCULAR LENS INSERTION Bilateral    lens disease due to homocysteinuria  . JOINT REPLACEMENT Left    THR  . NASAL SEPTUM SURGERY  march 2016   @ [facility]  . PARS PLANA VITRECTOMY Right 09/17/2013   Procedure: PARS PLANA VITRECTOMY WITH 25G REMOVAL/SUTURE SECONDARY INTRAOCULAR LENS;  Surgeon: Hayden Pedro, MD;  Location: [place];  Service: Ophthalmology;  Laterality: Right;  . PHOTOCOAGULATION Right 09/17/2013   Procedure: PHOTOCOAGULATION;  Surgeon: Hayden Pedro, MD;   Location: [place];  Service: Ophthalmology;  Laterality: Right;  HEADSCOPE LASER  . PULSE GENERATOR IMPLANT N/A 02/13/2019   Procedure: UNILATERAL PULSE GENERATOR IMPLANT;  Surgeon: Meade Maw, MD;  Location: [facility];  Service: Neurosurgery;  Laterality: N/A;  . scapholunate ligament reconstruction of right hand  2011  . UMBILICAL HERNIA REPAIR N/A 05/07/2015   Procedure: HERNIA REPAIR UMBILICAL ADULT;  Surgeon: Florene Glen, MD;  Location: [facility];  Service: General;  Laterality: N/A;  . UPPER GASTROINTESTINAL ENDOSCOPY  2012  . WRIST FUSION Left   . WRIST SURGERY Right     Medical History: Past Medical History:  Diagnosis Date  . Anxiety   .  Arthritis    knees,Right wrist  . Avascular necrosis ([place])   . Brain bleed ([place])   . Cataract    bilateral repair with lens implants  . Depression   . Dysplastic nevus 03/22/2018    left mid to upper back paraspinal 2.5cm lat to spine -  DYSPLASTIC COMPOUND NEVUS WITH MILD ATYPIA, LIMITED MARGINS FREE  . Family history of adverse reaction to anesthesia    n/v-mom  . GERD (gastroesophageal reflux disease)   . Homocystinuria ([place])   . Hypertension   . Idiopathic progressive polyneuropathy   . Lens disease   . Neuromuscular disorder ([place])   . Obesity, Class II, BMI 35-39.9   . OCD (obsessive compulsive disorder)   . Oxygen deficiency    2 liters  . Paresthesia 06/10/2015  . Plantar fasciitis   . Sleep apnea    cpap  . Stroke [facility])    with brain bleed at age 75-no deficits  . Umbilical hernia     Family History: Family History  Problem Relation Age of Onset  . Breast cancer Mother   . Hypertension Mother   . Colon polyps Mother   . Hypertension Father   . Colon polyps Father   . Stroke Maternal Grandmother   . Stroke Maternal Grandfather   . Stroke Paternal Grandmother   . Stroke Paternal Grandfather   . Stroke Other   . Colon cancer Neg Hx   . Esophageal cancer Neg Hx   . Rectal cancer Neg Hx   . Stomach cancer  Neg Hx     Social History   Socioeconomic History  . Marital status: Single    Spouse name: n/a  . Number of children: 0  . Years of education: college  . Highest education level: Not on file  Occupational History  . Occupation: unemployed/disabled    Comment: Lexicographer  Tobacco Use  . Smoking status: Never Smoker  . Smokeless tobacco: Never Used  Vaping Use  . Vaping Use: Never used  Substance and Sexual Activity  . Alcohol use: No    Alcohol/week: 0.0 standard drinks  . Drug use: No  . Sexual activity: Not on file  Other Topics Concern  . Not on file  Social History Narrative   Lives alone. Parents live nearby.  Applying for disability (OCD) due to wrist injury, hearing upcoming.      Patient drinks 4-5 cups of caffeine daily.   Patient is right handed.   Social Determinants of Health   Financial Resource Strain: Low Risk   . Difficulty of Paying Living Expenses: Not very hard  Food Insecurity: Not on file  Transportation Needs: Not on file  Physical Activity: Not on file  Stress: Not on file  Social Connections: Not on file  Intimate Partner Violence: Not on file      Review of Systems  Constitutional: Negative for chills, fatigue and unexpected weight change.  HENT: Positive for postnasal drip. Negative for congestion, rhinorrhea, sneezing and sore throat.   Eyes: Negative for redness.  Respiratory: Negative for cough, chest tightness and shortness of breath.   Cardiovascular: Negative for chest pain and palpitations.  Gastrointestinal: Negative for abdominal pain, constipation, diarrhea, nausea and vomiting.  Genitourinary: Negative for dysuria and frequency.  Musculoskeletal: Positive for gait problem. Negative for arthralgias, back pain, joint swelling and neck pain.  Skin: Positive for color change. Negative for rash.       Severe pain in bilateral lower extremities  Neurological: Negative for tremors  and numbness.  Hematological: Negative  for adenopathy. Does not bruise/bleed easily.  Psychiatric/Behavioral: Negative for behavioral problems (Depression), sleep disturbance and suicidal ideas. The patient is not nervous/anxious.     Vital Signs: BP 140/86   Pulse (!) 59   Temp 98.3 F (36.8 C)   Resp 16   Ht 6\' 3"  (1.905 m)   Wt (!) 362 lb 3.2 oz (164.3 kg)   SpO2 99%   BMI 45.27 kg/m    Physical Exam Constitutional:      Appearance: Normal appearance. He is obese.  HENT:     Head: Normocephalic and atraumatic.     Nose: Nose normal.     Mouth/Throat:     Mouth: Mucous membranes are moist.     Pharynx: No posterior oropharyngeal erythema.  Eyes:     Extraocular Movements: Extraocular movements intact.     Pupils: Pupils are equal, round, and reactive to light.  Cardiovascular:     Rate and Rhythm: Normal rate and regular rhythm.     Pulses: Normal pulses.     Heart sounds: Normal heart sounds.  Pulmonary:     Effort: Pulmonary effort is normal.     Breath sounds: Normal breath sounds.  Skin:    Findings: Erythema present.  Neurological:     General: No focal deficit present.     Mental Status: He is alert.  Psychiatric:        Mood and Affect: Mood normal.        Behavior: Behavior normal.    Assessment/Plan: 1. Chronic pain disorder Patient has chronic severe pain this is a progressive disorder, he has been on oral narcotics and still has breakthrough pain, I had a long discussion about switching therapy to Duragesic patch, also spoke with PA and she agrees with the treatment plan might need pain control during breakthrough periods of the day patient agrees understands the plan, controlled substance policy is given to the patient.  He understands the side effects and the importance of adherence to this policy and procedure   2. Neuropathic pain of both legs Progressive and chronic disease followed by neurology and neurosurgery, either because of her homocystinuria which he is treated with high-dose  vitamins, there might be a question of toxicity  3. Morbid obesity with BMI of 45.0-49.9, adult ([place]) Metabolic test for REE, patient is also being started on weight loss program with dietary caloric restriction and use of Leptins  4. Benign hypertension Blood pressure is slightly elevated today we will monitor, bradycardia is noted   General Counseling: dezjuan copus understanding of the findings of todays visit and agrees with plan of treatment. I have discussed any further diagnostic evaluation that may be needed or ordered today. We also reviewed his medications today. he has been encouraged to call the office with any questions or concerns that should arise related to todays visit.  Reviewed risks and possible side effects associated with taking opiates, benzodiazepines and other CNS depressants. Combination of these could cause dizziness and drowsiness. Advised patient not to drive or operate machinery when taking these medications, as patient's and other's life can be at risk and will have consequences. Patient verbalized understanding in this matter. Dependence and abuse for these drugs will be monitored closely. A Controlled substance policy and procedure is on file which allows [facility] to order a urine drug screen test at any visit. Patient understands and agrees with the plan   Orders Placed This Encounter  Procedures  .  Metabolic Test    Meds ordered this encounter  Medications  . fentaNYL (DURAGESIC) 12 MCG/HR    Sig: Place 1 patch onto the skin every 3 (three) days. For chronic and severe pain    Dispense:  10 patch    Refill:  0    Total time spent:35 Minutes Time spent includes review of chart, medications, test results, and follow up plan with the patient.   [place] Controlled Substance Database was reviewed by me.   Dr [name] Internal medicine

## 2021-04-14 ENCOUNTER — Telehealth: Payer: Self-pay | Admitting: Pharmacist

## 2021-04-14 NOTE — Progress Notes (Addendum)
Chronic Care Management Pharmacy Assistant   Name: Jesus Crosby.  MRN: [id number] DOB: 1966/10/26  Jesus Crosby. is an 55 y.o. year old male who presents for his initial CCM visit with the clinical pharmacist.  Reason for Encounter: Chart Prep   Conditions to be addressed/monitored: HTN, DDD, HLD,Anxiety,Pre-Diabetes.  Primary concerns for visit include:  Neuropathy  Recent office visits:  04/13/21 [name], MD. For Weight loss. STARTED Fenanyl 12 MCG/HR. 04/01/21 [name], NP. For routine adult health examination. No medication changes. 03/05/21 McDonough, Si Gaul, PA-C. For pain lower left extremity. No medication changes.  02/08/21 [name], MD. For OSA. No medication changes.  02/03/21 [name], NP. For follow-Up. STOPPED Azithromycin, Levofloxacin, Prednisone.  01/11/21 (Patient message) Per Dr. Kennith Maes has bronchitis. STARTED Levofloxacin 500 mg 1 tablet daily, Prednisone 10 mg pack, and Albuterol 108 (90 Base) MCG/ACT Inhaler inhale 2 puffs into lung every 6 hours as needed.  01/05/21 [name], NP. For acute non-recurrent pansinusitis. STARTED Azithromycin 250 mgTake two 250 mg tablets on day one followed by one 250 mg tablet each day for four days and Liodocaine HCL 2% 15 mls mouth//throat as needed. 11/19/20 [name], MD. For follow-Up. Per note: Discussed Sleep apnea. No medications   Recent consult visits:  04/12/21 [name], MD. No medication changes.  02/10/21 [name], No information given.  02/09/21 Psychiatry Trffy, Lilla Shook. 01/21/21 [facility] Mindi Junker. Trigger point injections.  01/15/21 [facility] Fojtik, [place] Bailiff and [place], [place].  11/16/20 Orthopedic Surgery  Viens, Hart Carwin A.  11/10/20 Orthopedic Surgery Hernandez-Soria, Alexia M.X-rays preformed.  10/26/20 Orthopedic Surgery Lovell Sheehan. X-rays preformed.   Hospital visits:  None in previous 6 months   Medication  History: Losartan 50 mg 90 DS 01/01/21  Medications: Outpatient Encounter Medications as of 04/14/2021  Medication Sig Note   albuterol (VENTOLIN HFA) 108 (90 Base) MCG/ACT inhaler INHALE 2 PUFFS INTO THE LUNGS EVERY 6 HOURS AS NEEDED FOR WHEEZING OR SHORTNESS OF BREATH    ALPRAZolam (XANAX) 0.5 MG tablet Take 0.5 mg by mouth 4 (four) times daily. By dr reddy 03/25/2014: .    amLODipine (NORVASC) 5 MG tablet Take one tab po bid    aspirin EC 81 MG tablet Take 81 mg by mouth at bedtime.     atorvastatin (LIPITOR) 10 MG tablet Take 1 tablet (10 mg total) by mouth at bedtime.    Betaine (CYSTADANE) POWD Take 8 Scoops by mouth 3 (three) times daily.    clotrimazole (LOTRIMIN) 1 % cream APPLY EXTERNALLY TO THE AFFECTED AREA TWICE DAILY    dicyclomine (BENTYL) 10 MG capsule Take 1 capsule (10 mg total) by mouth 4 (four) times daily -  before meals and at bedtime. *Please call to make a follow up appointment for June    DULoxetine (CYMBALTA) 60 MG capsule Take 60 mg by mouth daily.    fentaNYL (DURAGESIC) 12 MCG/HR Place 1 patch onto the skin every 3 (three) days. For chronic and severe pain    ferrous gluconate (FERGON) 324 MG tablet Take 1 tablet (324 mg total) by mouth daily with breakfast.    Ferrous Sulfate Dried 45 MG TBCR Take 45 mg by mouth at bedtime.    folic acid (FOLVITE) 644 MCG tablet Take 1,200 mcg by mouth at bedtime.     furosemide (LASIX) 20 MG tablet TAKE 1 TABLET(20 MG) BY MOUTH DAILY    gabapentin (NEURONTIN) 800 MG  tablet Take 2 tablets by mouth 2 (two) times daily.     lidocaine (XYLOCAINE) 2 % solution Use as directed 15 mLs in the mouth or throat as needed for mouth pain.    losartan (COZAAR) 50 MG tablet TAKE 1/2 TABLET(25 MG) BY MOUTH AT BEDTIME    metoprolol tartrate (LOPRESSOR) 50 MG tablet Take 1.5 tablets (75 mg total) by mouth 2 (two) times daily.    NONFORMULARY OR COMPOUNDED ITEM See pharmacy note    ondansetron (ZOFRAN) 8 MG tablet Take 1 tablet (8 mg total) by mouth  2 (two) times daily as needed for nausea or vomiting.    oxyCODONE (OXY IR/ROXICODONE) 5 MG immediate release tablet oxycodone 5 mg tablet  TAKE 1 TABLET BY MOUTH EVERY 6 HOURS AS NEEDED FOR PAIN    pantoprazole (PROTONIX) 40 MG tablet Take 1 tablet (40 mg total) by mouth daily.    pyridOXINE (VITAMIN B-6) 100 MG tablet Take 500 mg by mouth at bedtime.     sertraline (ZOLOFT) 100 MG tablet Take 200 mg by mouth daily.     Suvorexant 20 MG TABS Take 20 mg by mouth at bedtime.    vitamin B-12 (CYANOCOBALAMIN) 1000 MCG tablet Take 1,000 mcg by mouth at bedtime.    No facility-administered encounter medications on file as of 04/14/2021.    Have you seen any other providers since your last visit? Patient stated no.  Any changes in your medications or health? Patient stated no.  Any side effects from any medications? Patient stated Diarrhea/Constipation   Do you have an symptoms or problems not managed by your medications? Patient stated no.  Any concerns about your health right now? Patient stated neuropathy.  Has your provider asked that you check blood pressure, blood sugar, or follow special diet at home? Patient stated he is supposed to be on a diet.   Do you get any type of exercise on a regular basis? Patient stated no because of the pain in his feet.  Can you think of a goal you would like to reach for your health? Patient stated better weight control.   Do you have any problems getting your medications? Patient stated no.  Is there anything that you would like to discuss during the appointment? Patient stated no.  Please bring medications and supplements to appointment, patient reminded of OTP appointment for 5/17 at 130 pm.  Follow-Up:Pharmacist Review  Charlann Lange, [place] Pharmacist Assistant [phone]

## 2021-04-15 ENCOUNTER — Telehealth: Payer: Self-pay

## 2021-04-15 NOTE — Progress Notes (Signed)
Chronic Care Management Pharmacy Note  04/20/2021 Name:  Jesus Crosby [facility]. MRN:  [id number] DOB:  [birth year]  Subjective: Jesus Crosby. is an 55 y.o. year old male who is a primary patient of [name], MD.  The CCM team was consulted for assistance with disease management and care coordination needs.    Engaged with patient face to face for initial visit in response to provider referral for pharmacy case management and/or care coordination services.   Consent to Services:  The patient was given the following information about Chronic Care Management services today, agreed to services, and gave verbal consent: 1. CCM service includes personalized support from designated clinical staff supervised by the primary care provider, including individualized plan of care and coordination with other care providers 2. 24/7 contact phone numbers for assistance for urgent and routine care needs. 3. Service will only be billed when office clinical staff spend 20 minutes or more in a month to coordinate care. 4. Only one practitioner may furnish and bill the service in a calendar month. 5.The patient may stop CCM services at any time (effective at the end of the month) by phone call to the office staff. 6. The patient will be responsible for cost sharing (co-pay) of up to 20% of the service fee (after annual deductible is met). Patient agreed to services and consent obtained.  Patient Care Team: Jesus Guise, MD as PCP - General (Internal Medicine) Peterson Lombard, MD as Referring Physician (Orthopedic Surgery) [name], [facility] as Pharmacist (Pharmacist)  Recent office visits: 04/13/21 Jesus Guise, MD. For Weight loss. STARTED Fenanyl 12 MCG/HR. 04/01/21 [name], NP. For routine adult health examination. No medication changes. 03/05/21 McDonough, Si Gaul, PA-C. For pain lower left extremity. No medication changes.  02/08/21 [name], MD. For OSA. No medication changes.  02/03/21 [name], NP. For follow-Up. STOPPED Azithromycin, Levofloxacin, Prednisone.  01/11/21 (Patient message) Per Dr. Kennith Maes has bronchitis. STARTED Levofloxacin 500 mg 1 tablet daily, Prednisone 10 mg pack, and Albuterol 108 (90 Base) MCG/ACT Inhaler inhale 2 puffs into lung every 6 hours as needed.  01/05/21 [name], NP. For acute non-recurrent pansinusitis. STARTED Azithromycin 250 mgTake two 250 mg tablets on day one followed by one 250 mg tablet each day for four days and Liodocaine HCL 2% 15 mls mouth//throat as needed. 11/19/20 [name], MD. For follow-Up. Per note: Discussed Sleep apnea. No medications   Recent consult visits: 04/12/21 [name], MD. No medication changes.  02/10/21 [name], No information given.  02/09/21 Psychiatry Trffy, Lilla Shook. 01/21/21 [facility] Mindi Junker. Trigger point injections.  01/15/21 [facility] Fojtik, [place] Bailiff and [place], [place].  11/16/20 Orthopedic Surgery  Viens, Hart Carwin A.  11/10/20 Orthopedic Surgery Hernandez-Soria, Alexia M.X-rays preformed.  10/26/20 Orthopedic Surgery Lovell Sheehan. X-rays preformed.   Hospital visits: None in previous 6 months  Medication History: Losartan 50 mg 90 DS 01/01/21  Objective:  Lab Results  Component Value Date   CREATININE 0.97 04/07/2021   BUN 11 04/07/2021   GFR 73.96 09/21/2018   GFRNONAA 80 04/27/2020   GFRAA 92 04/27/2020   NA 134 04/07/2021   K 4.4 04/07/2021   CALCIUM 9.1 04/07/2021   CO2 25 04/07/2021   GLUCOSE 99 04/07/2021    Lab Results  Component Value Date/Time   HGBA1C 5.1 10/04/2018 06:47 PM   HGBA1C 5.5 08/15/2014 12:26 PM   GFR 73.96 09/21/2018 04:29 PM  GFR 91.88 07/21/2015 03:20 PM    Last diabetic Eye exam: No results found for: HMDIABEYEEXA  Last diabetic Foot exam: No results found for: HMDIABFOOTEX   Lab Results  Component Value Date   CHOL 195 04/07/2021   HDL 40 04/07/2021   LDLCALC 127 (H) 04/07/2021   TRIG 155 (H)  04/07/2021   CHOLHDL 7.5 10/05/2018    Hepatic Function Latest Ref Rng & Units 04/07/2021 04/27/2020 10/04/2018  Total Protein 6.0 - 8.5 g/dL 7.4 7.7 9.0(H)  Albumin 3.8 - 4.9 g/dL 4.0 4.2 4.7  AST 0 - 40 IU/L 36 33 57(H)  ALT 0 - 44 IU/L 47(H) 41 56(H)  Alk Phosphatase 44 - 121 IU/L 85 65 54  Total Bilirubin 0.0 - 1.2 mg/dL 0.3 0.3 0.9  Bilirubin, Direct 0.0 - 0.3 mg/dL - - -    Lab Results  Component Value Date/Time   TSH 1.270 04/07/2021 10:45 AM   TSH 1.110 04/27/2020 09:44 AM   FREET4 1.15 04/07/2021 10:45 AM   FREET4 0.92 04/27/2020 09:44 AM    CBC Latest Ref Rng & Units 04/07/2021 11/09/2020 08/05/2020  WBC 3.4 - 10.8 x10E3/uL 6.9 8.3 5.8  Hemoglobin 13.0 - 17.7 g/dL 13.7 13.6 12.6(L)  Hematocrit 37.5 - 51.0 % 40.6 39.1 36.8(L)  Platelets 150 - 450 x10E3/uL 190 258.0 240.0    No results found for: VD25OH  Clinical ASCVD: No  The 10-year ASCVD risk score Mikey Bussing DC Jr., et al., 2013) is: 8.3%   Values used to calculate the score:     Age: 11 years     Sex: Male     Is Non-Hispanic African American: No     Diabetic: No     Tobacco smoker: No     Systolic Blood Pressure: 045 mmHg     Is BP treated: Yes     HDL Cholesterol: 40 mg/dL     Total Cholesterol: 195 mg/dL    Depression screen [facility] 2/9 04/13/2021 04/01/2021 09/21/2020  Decreased Interest 0 1 0  Down, Depressed, Hopeless 0 1 0  PHQ - 2 Score 0 2 0  Altered sleeping - - -  Tired, decreased energy - - -  Change in appetite - - -  Feeling bad or failure about yourself  - - -  Trouble concentrating - - -  Moving slowly or fidgety/restless - - -  Suicidal thoughts - - -  PHQ-9 Score - - -  Difficult doing work/chores - - -  Some recent data might be hidden      Social History   Tobacco Use  Smoking Status Never Smoker  Smokeless Tobacco Never Used   BP Readings from Last 3 Encounters:  04/13/21 140/86  04/01/21 138/72  03/05/21 126/68   Pulse Readings from Last 3 Encounters:  04/13/21 (!) 59   04/01/21 74  03/05/21 76   Wt Readings from Last 3 Encounters:  04/13/21 (!) 362 lb 3.2 oz (164.3 kg)  04/01/21 (!) 366 lb 3.2 oz (166.1 kg)  03/05/21 (!) 366 lb 12.8 oz (166.4 kg)   BMI Readings from Last 3 Encounters:  04/13/21 45.27 kg/m  04/01/21 45.77 kg/m  03/05/21 45.85 kg/m    Assessment/Interventions: Review of patient past medical history, allergies, medications, health status, including review of consultants reports, laboratory and other test data, was performed as part of comprehensive evaluation and provision of chronic care management services.   SDOH:  (Social Determinants of Health) assessments and interventions performed: Yes   Financial Resource Strain:  Low Risk   . Difficulty of Paying Living Expenses: Not very hard     SDOH Screenings   Alcohol Screen: Low Risk   . Last Alcohol Screening Score (AUDIT): 0  Depression (PHQ2-9): Low Risk   . PHQ-2 Score: 0  Financial Resource Strain: Low Risk   . Difficulty of Paying Living Expenses: Not very hard  Food Insecurity: Not on file  Housing: Not on file  Physical Activity: Not on file  Social Connections: Not on file  Stress: Not on file  Tobacco Use: Low Risk   . Smoking Tobacco Use: Never Smoker  . Smokeless Tobacco Use: Never Used  Transportation Needs: Not on file    CCM Care Plan  Allergies  Allergen Reactions  . Pineapple Swelling    Lips swelling  . Amoxicillin Nausea And Vomiting    Did it involve swelling of the face/tongue/throat, SOB, or low BP? No Did it involve sudden or severe rash/hives, skin peeling, or any reaction on the inside of your mouth or nose? No Did you need to seek medical attention at a hospital or doctor's office? No When did it last happen?10 + years If all above answers are "NO", may proceed with cephalosporin use.   Marland Kitchen Keflex [Cephalexin] Diarrhea    Medications Reviewed Today    Reviewed by [name], [facility] (Pharmacist) on 04/20/21 at 5  Med  List Status: <None>  Medication Order Taking? Sig Documenting Provider Last Dose Status Informant        Discontinued 04/20/21 1346 (Completed Course)   ALPRAZolam (XANAX) 0.5 MG tablet [id number] Yes Take 0.5 mg by mouth 4 (four) times daily. By dr reddy [provider] Taking Active Self           Med Note Johnnye Sima, MARGARET G   Tue Mar 25, 2014 11:15 PM) .   amLODipine (NORVASC) 5 MG tablet [id number] Yes Take one tab po bid Jesus Guise, MD Taking Active   aspirin EC 81 MG tablet [id number] Yes Take 81 mg by mouth at bedtime.  [provider] Taking Active Self  atorvastatin (LIPITOR) 10 MG tablet [id number] Yes Take 1 tablet (10 mg total) by mouth at bedtime. Kendell Bane, NP Taking Active   Betaine [facility]) POWD [id number] Yes Take 8 Scoops by mouth 3 (three) times daily. [provider] Taking Active Self  clotrimazole (LOTRIMIN) 1 % cream [id number] Yes APPLY EXTERNALLY TO THE AFFECTED AREA TWICE DAILY [place], Audie Clear, NP Taking Active   dicyclomine (BENTYL) 10 MG capsule [id number] Yes Take 1 capsule (10 mg total) by mouth 4 (four) times daily -  before meals and at bedtime. *Please call to make a follow up appointment for June Esterwood, Amy S, [place] Taking Active   DULoxetine (CYMBALTA) 60 MG capsule [id number] Yes Take 60 mg by mouth daily. [provider] Taking Active   fentaNYL ([place]) 12 MCG/HR [id number] Yes Place 1 patch onto the skin every 3 (three) days. For chronic and severe pain Jesus Guise, MD Taking Active   ferrous gluconate (FERGON) 324 MG tablet [id number]  Take 1 tablet (324 mg total) by mouth daily with breakfast. [name], MD  Expired 11/05/20 2359   Ferrous Sulfate Dried 45 MG TBCR [id number]  Take 45 mg by mouth at bedtime. [name], MD  Expired 46/27/03 5009   folic acid (FOLVITE) 381 MCG tablet [id number] Yes Take 1,200 mcg by mouth at bedtime.  [provider] Taking Active Self  furosemide (  LASIX) 20  MG tablet [id number] Yes TAKE 1 TABLET(20 MG) BY MOUTH DAILY Jesus Guise, MD Taking Active   gabapentin (NEURONTIN) 800 MG tablet [id number] Yes Take 2 tablets by mouth 2 (two) times daily.  [provider] Taking Active         Discontinued 04/20/21 1355 (Completed Course)   losartan (COZAAR) 50 MG tablet [id number] Yes TAKE 1/2 TABLET(25 MG) BY MOUTH AT BEDTIME Jesus Guise, MD Taking Active   metoprolol tartrate (LOPRESSOR) 50 MG tablet [id number] Yes Take 1.5 tablets (75 mg total) by mouth 2 (two) times daily. Jesus Guise, MD Taking Active   NONFORMULARY OR COMPOUNDED ITEM [id number] Yes See pharmacy note Edrick Kins, DPM Taking Active   ondansetron [facility]) 8 MG tablet [id number] Yes Take 1 tablet (8 mg total) by mouth 2 (two) times daily as needed for nausea or vomiting. [name], NP Taking Active   oxyCODONE (OXY IR/ROXICODONE) 5 MG immediate release tablet [id number] Yes oxycodone 5 mg tablet  TAKE 1 TABLET BY MOUTH EVERY 6 HOURS AS NEEDED FOR PAIN [provider] Taking Active   pantoprazole (PROTONIX) 40 MG tablet [id number] Yes Take 1 tablet (40 mg total) by mouth daily. [name], MD Taking Active   pyridOXINE (VITAMIN B-6) 100 MG tablet [id number] Yes Take 500 mg by mouth at bedtime.  [provider] Taking Active Self  sertraline (ZOLOFT) 100 MG tablet [id number] Yes Take 200 mg by mouth daily.  [provider] Taking Active Self       Patient not taking:      Discontinued 04/20/21 1402 (Patient Preference)   vitamin B-12 (CYANOCOBALAMIN) 1000 MCG tablet [id number] Yes Take 1,000 mcg by mouth at bedtime. [provider] Taking Active Self          Patient Active Problem List   Diagnosis Date Noted  . Spinal cord stimulator status 01/08/2020  . Complex regional pain syndrome type 2 of both lower extremities 01/03/2019  . Neuropathic pain of both legs 01/03/2019  . Aphasia 10/04/2018  . Arthritis 04/26/2018  . Arthritis  of knee 04/26/2018  . Bilateral cataracts 04/26/2018  . Class 1 obesity 04/26/2018  . Disorder of lens 04/26/2018  . Exomphalos 04/26/2018  . OSA on CPAP 04/26/2018  . Prediabetes 04/26/2018  . History of total hip arthroplasty 07/05/2017  . Spinal stenosis of lumbar region 04/25/2017  . Trochanteric bursitis 02/27/2017  . S/P laparoscopic sleeve gastrectomy 08/17/2016  . Alcohol-induced mood disorder ([place])   . OCD (obsessive compulsive disorder)   . Paresthesia 06/10/2015  . Diplopia 08/15/2014  . Sinus tachycardia 08/15/2014  . CVA (cerebral infarction) 08/14/2014  . Neuropathy 04/04/2014  . Intraocular lens dislocation 09/12/2013  . DDD (degenerative disc disease), lumbosacral 07/04/2013  . HTN (hypertension) 07/04/2013  . Major depressive disorder, single episode 07/03/2009  . 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 HLD (hyperlipidemia) 05/28/2008  . ANXIETY 05/28/2008  . OBSESSIVE-COMPULSIVE DISORDER 05/28/2008    Immunization History  Administered Date(s) Administered  . Influenza Inj Mdck Quad Pf 01/24/2018, 08/28/2018  . Influenza Split 11/04/2013  . Influenza,inj,Quad PF,6+ Mos 08/16/2014  . Influenza-Unspecified 09/03/2020  . PFIZER(Purple Top)SARS-COV-2 Vaccination 02/13/2020, 03/05/2020, 09/10/2020  . Td 08/05/2006    Conditions to be addressed/monitored:  HTN, DDD/Arthritis, HLD,Anxiety/Depression,Pre-Diabetes, OCD  Care Plan : General Pharmacy (Adult)  Updates made by [name], RPH since 04/20/2021 12:00 AM    Problem: HTN, DDD/Arthritis, HLD,Anxiety/Depression,Pre-Diabetes, OCD   Priority: High  Onset Date: 04/20/2021    Long-Range Goal: Patient-Specific Goal   Start Date: 04/20/2021  Expected End Date: 10/21/2021  This Visit's Progress: On track  Priority: High  Note:   Current Barriers:  . Unable to achieve control of cholesterol (LDL)  . Suboptimal therapeutic regimen for chronic  pain/neuropathy  Pharmacist Clinical Goal(s):  Marland Kitchen Patient will achieve control of LDL as evidenced by lipid panel . adhere to plan to optimize therapeutic regimen for chronic neuropathy as evidenced by report of adherence to recommended medication management changes . contact provider office for questions/concerns as evidenced notation of same in electronic health record through collaboration with PharmD and provider.   Interventions: . 1:1 collaboration with Jesus Guise, MD regarding development and update of comprehensive plan of care as evidenced by provider attestation and co-signature . Inter-disciplinary care team collaboration (see longitudinal plan of care) . Comprehensive medication review performed; medication list updated in electronic medical record  Hypertension (BP goal <140/90) -Controlled -Current treatment: . Amlodipine 5mg  daily . Losartan 50mg   One-half tablet hs . Metoprolol tartrate 50mg  one and one-half tablets po BID -Medications previously tried: losartan/HCTZ -Current home readings: 115/70 -Current exercise habits: minimal due to pain in feet -Denies hypotensive/hypertensive symptoms -Educated on BP goals and benefits of medications for prevention of heart attack, stroke and kidney damage; Importance of home blood pressure monitoring; -Counseled to monitor BP at home a few times per week, document, and provide log at future appointments -Recommended to continue current medication  Hyperlipidemia: (LDL goal < 100) -Not ideally controlled -Current treatment: . Atorvastatin 10mg  hs -Medications previously tried: none noted  -Current exercise habits: minimal -Educated on Cholesterol goals;  Benefits of statin for ASCVD risk reduction; Importance of limiting foods high in cholesterol;  -LDL elevated above goal, patient tolerating medication well -Comorbidity of homocystinuria which also increases the risk for blood clots -Recommended to continue current  medication Collaborated with PCP, will recommend dose increase to atorvastatin 20mg  daily considering increased risk for clotting with comorbidities.  Depression/Anxiety/OCD (Goal: Minimize symptoms) -Controlled -Current treatment: . Duloxetine 60mg  daily . Alprazolam 0.5mg  QID prn . Sertraline 200mg  daily -Medications previously tried/failed: risperidone, trazodone -Educated on Benefits of medication for symptom control  -Patient reports overall stable mood recently -Sleep is off and on, mostly related to pain level -Recommended to continue current medication  Osteoarthritis/DDD/Neuropathy (Goal: Control pain, improve quality of life) -Not ideally controlled -Current treatment  . Gabapentin 800mg  two tablets po bid . Oxycodone 5mg  IR - breakthrough pain . Fentanyl 64mcg/hr - has not started yet . Duloxetine 60mg  daily -Medications previously tried: none noted -Patient going to start Fentanyl patches today, concerned about use of multiple opioids plus benzos -Encouraged him to only use oxycodone for breakthrough and if his pain is not controlled to contact providers so we may can make adjustments to Fentanyl patches.  -Recommended to continue current medication Educated on use of Fentanyl patches and to avoid direct heat, change patches every 72 hours.   Patient Goals/Self-Care Activities . Patient will:  - take medications as prescribed check blood pressure daily to weekly, document, and provide at future appointments engage in dietary modifications by cutting back on high fat foods.  Follow Up Plan: The care management team will reach out to the patient again over the next 120 days.         Medication Assistance: None required.  Patient affirms current coverage meets needs.  Patient's preferred pharmacy is:  [facility], [place] - [address] [place] [address] Phone: [phone] Fax: [phone]  [facility]  [id number] [place], [place] - [address] [place] [address] Phone: [phone] Fax: [phone]  [place], [place] [address] Phone: [phone] Fax: [phone]  Uses pill box? Yes Pt endorses 100% compliance  We discussed: Benefits of medication synchronization, packaging and delivery as well as enhanced pharmacist oversight with [employer]. Patient decided to: Continue current medication management strategy  Care Plan and Follow Up Patient Decision:  Patient agrees to Care Plan and Follow-up.  Plan: The care management team will reach out to the patient again over the next 120 days.  Beverly Milch, PharmD Clinical Pharmacist [facility] [phone]

## 2021-04-15 NOTE — Telephone Encounter (Signed)
Left message for patient to call office for results/hd 

## 2021-04-15 NOTE — Telephone Encounter (Signed)
Patient advised of BX results.  °

## 2021-04-15 NOTE — Telephone Encounter (Signed)
-----   Message from [name], MD sent at 04/15/2021 12:56 PM EDT ----- Diagnosis 1. Skin , right anterior side FIBROEPITHELIAL POLYP 2. Skin , right posterior side FIBROEPITHELIAL POLYP  1&2 - both benign skin tags No further treatment needed

## 2021-04-16 DIAGNOSIS — Z79891 Long term (current) use of opiate analgesic: Secondary | ICD-10-CM | POA: Diagnosis not present

## 2021-04-16 DIAGNOSIS — G9050 Complex regional pain syndrome I, unspecified: Secondary | ICD-10-CM | POA: Diagnosis not present

## 2021-04-16 DIAGNOSIS — Z79899 Other long term (current) drug therapy: Secondary | ICD-10-CM | POA: Diagnosis not present

## 2021-04-16 DIAGNOSIS — G894 Chronic pain syndrome: Secondary | ICD-10-CM | POA: Diagnosis not present

## 2021-04-16 DIAGNOSIS — G629 Polyneuropathy, unspecified: Secondary | ICD-10-CM | POA: Diagnosis not present

## 2021-04-16 DIAGNOSIS — Z5181 Encounter for therapeutic drug level monitoring: Secondary | ICD-10-CM | POA: Diagnosis not present

## 2021-04-19 ENCOUNTER — Encounter: Payer: Self-pay | Admitting: Internal Medicine

## 2021-04-19 DIAGNOSIS — G4733 Obstructive sleep apnea (adult) (pediatric): Secondary | ICD-10-CM | POA: Diagnosis not present

## 2021-04-19 DIAGNOSIS — G4731 Primary central sleep apnea: Secondary | ICD-10-CM | POA: Diagnosis not present

## 2021-04-19 NOTE — Telephone Encounter (Signed)
Please see this

## 2021-04-20 ENCOUNTER — Ambulatory Visit: Payer: Medicare HMO | Admitting: Pharmacist

## 2021-04-20 DIAGNOSIS — G629 Polyneuropathy, unspecified: Secondary | ICD-10-CM

## 2021-04-20 DIAGNOSIS — I10 Essential (primary) hypertension: Secondary | ICD-10-CM

## 2021-04-20 DIAGNOSIS — F429 Obsessive-compulsive disorder, unspecified: Secondary | ICD-10-CM

## 2021-04-20 DIAGNOSIS — G4733 Obstructive sleep apnea (adult) (pediatric): Secondary | ICD-10-CM | POA: Diagnosis not present

## 2021-04-20 DIAGNOSIS — G4731 Primary central sleep apnea: Secondary | ICD-10-CM | POA: Diagnosis not present

## 2021-04-20 DIAGNOSIS — M5137 Other intervertebral disc degeneration, lumbosacral region: Secondary | ICD-10-CM

## 2021-04-20 DIAGNOSIS — E785 Hyperlipidemia, unspecified: Secondary | ICD-10-CM

## 2021-04-20 NOTE — Patient Instructions (Addendum)
Visit Information  Goals Addressed            This Visit's Progress   . Manage My Medicine       Timeframe:  Long-Range Goal Priority:  High Start Date:   04/20/21                          Expected End Date: 10/21/21                      Follow Up Date 06/10/2021   Contact providers with any concerns regarding pain medication. Caution to avoid excessive heat with Fentanyl patch.    Why is this important?   . These steps will help you keep on track with your medicines.   Notes:       Patient Care Plan: General Pharmacy (Adult)    Problem Identified: HTN, DDD/Arthritis, HLD,Anxiety/Depression,Pre-Diabetes, OCD   Priority: High  Onset Date: 04/20/2021    Long-Range Goal: Patient-Specific Goal   Start Date: 04/20/2021  Expected End Date: 10/21/2021  This Visit's Progress: On track  Priority: High  Note:   Current Barriers:  . Unable to achieve control of cholesterol (LDL)  . Suboptimal therapeutic regimen for chronic pain/neuropathy  Pharmacist Clinical Goal(s):  Marland Kitchen Patient will achieve control of LDL as evidenced by lipid panel . adhere to plan to optimize therapeutic regimen for chronic neuropathy as evidenced by report of adherence to recommended medication management changes . contact provider office for questions/concerns as evidenced notation of same in electronic health record through collaboration with PharmD and provider.   Interventions: . 1:1 collaboration with [name], MD regarding development and update of comprehensive plan of care as evidenced by provider attestation and co-signature . Inter-disciplinary care team collaboration (see longitudinal plan of care) . Comprehensive medication review performed; medication list updated in electronic medical record  Hypertension (BP goal <140/90) -Controlled -Current treatment: . Amlodipine 5mg  daily . Losartan 50mg   One-half tablet hs . Metoprolol tartrate 50mg  one and one-half tablets po BID -Medications  previously tried: losartan/HCTZ -Current home readings: 115/70 -Current exercise habits: minimal due to pain in feet -Denies hypotensive/hypertensive symptoms -Educated on BP goals and benefits of medications for prevention of heart attack, stroke and kidney damage; Importance of home blood pressure monitoring; -Counseled to monitor BP at home a few times per week, document, and provide log at future appointments -Recommended to continue current medication  Hyperlipidemia: (LDL goal < 100) -Not ideally controlled -Current treatment: . Atorvastatin 10mg  hs -Medications previously tried: none noted  -Current exercise habits: minimal -Educated on Cholesterol goals;  Benefits of statin for ASCVD risk reduction; Importance of limiting foods high in cholesterol;  -LDL elevated above goal, patient tolerating medication well -Comorbidity of homocystinuria which also increases the risk for blood clots -Recommended to continue current medication Collaborated with PCP, will recommend dose increase to atorvastatin 20mg  daily considering increased risk for clotting with comorbidities.  Depression/Anxiety/OCD (Goal: Minimize symptoms) -Controlled -Current treatment: . Duloxetine 60mg  daily . Alprazolam 0.5mg  QID prn . Sertraline 200mg  daily -Medications previously tried/failed: risperidone, trazodone -Educated on Benefits of medication for symptom control  -Patient reports overall stable mood recently -Sleep is off and on, mostly related to pain level -Recommended to continue current medication  Osteoarthritis/DDD/Neuropathy (Goal: Control pain, improve quality of life) -Not ideally controlled -Current treatment  . Gabapentin 800mg  two tablets po bid . Oxycodone 5mg  IR - breakthrough pain . Fentanyl 72mcg/hr - has not  started yet . Duloxetine 60mg  daily -Medications previously tried: none noted -Patient going to start Fentanyl patches today, concerned about use of multiple opioids plus  benzos -Encouraged him to only use oxycodone for breakthrough and if his pain is not controlled to contact providers so we may can make adjustments to Fentanyl patches.  -Recommended to continue current medication Educated on use of Fentanyl patches and to avoid direct heat, change patches every 72 hours.   Patient Goals/Self-Care Activities . Patient will:  - take medications as prescribed check blood pressure daily to weekly, document, and provide at future appointments engage in dietary modifications by cutting back on high fat foods.  Follow Up Plan: The care management team will reach out to the patient again over the next 120 days.        Jesus Crosby was given information about Chronic Care Management services today including:  1. CCM service includes personalized support from designated clinical staff supervised by his physician, including individualized plan of care and coordination with other care providers 2. 24/7 contact phone numbers for assistance for urgent and routine care needs. 3. Standard insurance, coinsurance, copays and deductibles apply for chronic care management only during months in which we provide at least 20 minutes of these services. Most insurances cover these services at 100%, however patients may be responsible for any copay, coinsurance and/or deductible if applicable. This service may help you avoid the need for more expensive face-to-face services. 4. Only one practitioner may furnish and bill the service in a calendar month. 5. The patient may stop CCM services at any time (effective at the end of the month) by phone call to the office staff.  Patient agreed to services and verbal consent obtained.   The patient verbalized understanding of instructions, educational materials, and care plan provided today and agreed to receive a mailed copy of patient instructions, educational materials, and care plan.  Telephone follow up appointment with pharmacy team  member scheduled for: 4 months  [name], [facility] Fentanyl skin patch What is this medicine? FENTANYL (FEN ta nil) is a pain reliever, also called an opioid. It treats severe pain. This medicine may be used for other purposes; ask your health care provider or pharmacist if you have questions. COMMON BRAND NAME(S): Duragesic What should I tell my health care provider before I take this medicine? They need to know if you have any of these conditions:  brain tumor  drug abuse or addiction  head injury  heart disease  if you often drink alcohol  kidney disease  liver disease  lung disease, asthma, or breathing problem  seizures  stomach or intestine problems  taken an MAOI such as Marplan, Nardil, or Parnate in the last 14 days  an unusual or allergic reaction to fentanyl, other drugs, foods, dyes, or preservatives  pregnant or trying to get pregnant  breast-feeding How should I use this medicine? This medicine is for external use only. Apply the patch to your skin. Select a clean, dry area of skin above your waist on your front or back. The upper back is a good spot to put the patch on children or people who are confused because it will be hard for them to remove the patch. Do not apply the patch to oily, broken, burned, cut, or irritated skin. Use only water to clean the area. Do not use soap or alcohol to clean the skin because this can increase the effects of the medicine. If the area is  hairy, clip the hair with scissors, but do not shave. Do not cut or damage the patch. A cut or damaged patch can be very dangerous because you may get too much medicine. Take the patch out of its wrapper, and take off the protective strip over the sticky part. Do not use a patch if the packaging or backing is damaged. Do not touch the sticky part with your fingers. Press the sticky surface to the skin using the palm of your hand. Press the patch to the skin for 30 seconds. Wash your hands  at once with soap and water. Keep patches far away from children. Do not let children see you apply the patch and do not apply it where children can see it. Do not call the patch a sticker, tattoo, or bandage, as this could encourage the child to mimic your actions. Used patches still contain medicine. Children or pets can have serious side effects or die from putting used patches in their mouth or on their bodies. Take off the old patch before putting on a new patch. Apply each new patch to a different area of skin. If a patch comes off or causes irritation, remove it and apply a new patch to different site. If the edges of the patch start to loosen, first apply first aid tape to the edges of the patch. If problems with the patch not sticking continue, cover the patch with a see-through adhesive dressing (like Bioclusive or Tegaderm). Never cover the patch with any other bandage or tape. A special MedGuide will be given to you by the pharmacist with each prescription and refill. Be sure to read this information carefully each time. Talk to your pediatrician regarding the use of this medicine in children. While this drug may be prescribed for children as young as 2 years old for selected conditions, precautions do apply. Overdosage: If you think you have taken too much of this medicine contact a poison control center or emergency room at once. NOTE: This medicine is only for you. Do not share this medicine with others. What if I miss a dose? If you miss a dose, use it as soon as you can. If it is almost time for your next dose, use only that dose. Do not use double or extra doses. What may interact with this medicine? Do not take this medication with any of the following medicines:  mifepristone  safinamide  samidorphan This medicine may also interact with the following medications:  alcohol  antihistamines for allergy, cough, and cold  atropine  certain antibiotics like clarithromycin,  erythromycin, linezolid, rifampin  certain antivirals for HIV or hepatitis  certain medicines for anxiety or sleep  certain medicines for bladder problems like oxybutynin, tolterodine  certain medicines for depression like amitriptyline, fluoxetine, sertraline, mirtazapine, trazodone  certain medicines for fungal infections like ketoconazole, itraconazole, posaconazole  certain medicines for migraine headache like almotriptan, eletriptan, frovatriptan, naratriptan, rizatriptan, sumatriptan, zolmitriptan  certain medicines for nausea or vomiting like dolasetron, granisetron, ondansetron, palonosetron  certain medicines for Parkinson's disease like benztropine, trihexyphenidyl  certain medicines for seizures like carbamazepine, phenobarbital, phenytoin, primidone  certain medicines for stomach problems like dicyclomine, hyoscyamine  certain medicines for travel sickness like scopolamine  diuretics  general anesthetics like halothane, isoflurane, methoxyflurane, propofol  ipratropium  MAOIs like Marplan, Nardil, and Parnate  medicines that relax muscles  methylene blue  other narcotic medicines for pain or cough  phenothiazines like chlorpromazine, mesoridazine, prochlorperazine, thioridazine This list may not describe all  possible interactions. Give your health care provider a list of all the medicines, herbs, non-prescription drugs, or dietary supplements you use. Also tell them if you smoke, drink alcohol, or use illegal drugs. Some items may interact with your medicine. What should I watch for while using this medicine? Tell your health care provider if your pain does not go away, if it gets worse, or if you have new or a different type of pain. You may develop tolerance to this medicine. Tolerance means that you will need a higher dose of the medicine for pain relief. Tolerance is normal and is expected if you take this medicine for a long time. Do not suddenly stop taking  your medicine because you may develop a severe reaction. Your body becomes used to the medicine. This does NOT mean you are addicted. Addiction is a behavior related to getting and using a medicine for a nonmedical reason. If you have pain, you have a medical reason to take pain medicine. Your health care provider will tell you how much medicine to take. If your health care provider wants you to stop the medicine, the dose will be slowly lowered over time to avoid any side effects. If you take other medicines that also cause drowsiness such as other narcotic pain medicines, benzodiazepines, or other medicines for sleep, you may have more side effects. Give your health care provider a list of all medicines you use. He or she will tell you how much medicine to take. Do not take more medicine than directed. Get emergency help right away if you have trouble breathing or are unusually tired or sleepy. Talk to your health care provider about naloxone and how to get it. Naloxone is an emergency medicine used for an opioid overdose. An overdose can happen if you take too much opioid. It can also happen if an opioid is taken with some other medicines or substances, such as alcohol. Know the symptoms of an overdose, such as trouble breathing, unusually tired or sleepy, or not being able to respond or wake up. Make sure to tell caregivers and close contacts where it is stored. Make sure they know how to use it. After naloxone is given, you must get emergency help right away. Naloxone is a temporary treatment. Repeat doses may be needed. You may get drowsy or dizzy. Do not drive, use machinery, or do anything that needs mental alertness until you know how this medicine affects you. Do not stand up or sit up quickly, especially if you are an older patient. This reduces the risk of dizzy or fainting spells. Alcohol may interfere with the effect of this medicine. Avoid alcoholic drinks. This medicine will cause constipation.  If you do not have a bowel movement for 3 days, call your health care provider. Your mouth may get dry. Chewing sugarless gum or sucking hard candy and drinking plenty of water may help. Contact your health care provider if the problem does not go away or is severe. This patch is sensitive to body heat changes. If your skin gets too hot, more medicine will come out of the patch and can cause a deadly overdose. Call your health care provider if you get a fever. Do not take hot baths. Do not sunbathe. Do not use hot tubs, saunas, hairdryers, heating pads, electric blankets, heated waterbeds, or tanning lamps. Do not do exercise that increases your body temperature. If you are going to need surgery, an MRI, CT scan, or other procedure, tell your health  care provider that you are using this medicine. You may need to remove the patch before the procedure. What side effects may I notice from receiving this medicine? Side effects that you should report to your doctor or health care professional as soon as possible:  allergic reactions (skin rash, itching or hives; swelling of the face, lips, or tongue)  confusion  kidney injury (trouble passing urine or change in the amount of urine)  low adrenal gland function (nausea; vomiting; loss of appetite; unusually weak or tired; dizziness; low blood pressure)  low blood pressure (dizziness; feeling faint or lightheaded, falls; unusually weak or tired)  serotonin syndrome (irritable; confusion; diarrhea; fast or irregular heartbeat; muscle twitching; stiff muscles; trouble walking; sweating; high fever; seizures; chills; vomiting)  trouble breathing Side effects that usually do not require medical attention (report to your doctor or health care professional if they continue or are bothersome):  constipation  dry mouth  itching at the site where the patch was applied  nausea, vomiting  tiredness This list may not describe all possible side effects.  Call your doctor for medical advice about side effects. You may report side effects to FDA at 1-800-FDA-1088. Where should I keep my medicine? Keep out of the reach of children and pets. This medicine can be abused. Keep it in a safe place to protect it from theft. Do not share it with anyone. It is only for you. Selling or giving away this medicine is dangerous and against the law. Store at room temperature between 20 and 25 degrees C (68 and 77 degrees F). Keep this medicine in the original packaging until you are ready to take it. Get rid of any unused medicine after the expiration date. This medicine may cause harm and death if it is taken by other adults, children, or pets. It is important to get rid of the medicine as soon as you no longer need it or it is expired. You can do this in two ways:  Take the medicine to a medicine take-back program. Check with your pharmacy or law enforcement to find a location.  If you cannot return the medicine, flush it down the toilet. NOTE: This sheet is a summary. It may not cover all possible information. If you have questions about this medicine, talk to your doctor, pharmacist, or health care provider.  2021 [employer]/Gold Standard (2020-10-27 18:17:05)

## 2021-04-21 ENCOUNTER — Telehealth: Payer: Self-pay

## 2021-04-21 NOTE — Telephone Encounter (Signed)
Received notification of successful CPAP machine setup. Setup was completed on 04/19/2021 by Claudina Lick (Training under [place]). Thru [employer].   Equipment Issued:  AirSense 11 Auto CPAP   Mask Issued: Resmed Airfit [id number]

## 2021-04-23 ENCOUNTER — Encounter: Payer: Self-pay | Admitting: Internal Medicine

## 2021-04-23 ENCOUNTER — Other Ambulatory Visit: Payer: Self-pay

## 2021-04-23 DIAGNOSIS — G894 Chronic pain syndrome: Secondary | ICD-10-CM

## 2021-04-23 MED ORDER — ONDANSETRON HCL 8 MG PO TABS
8.0000 mg | ORAL_TABLET | Freq: Two times a day (BID) | ORAL | 3 refills | Status: DC | PRN
Start: 2021-04-23 — End: 2021-06-24

## 2021-04-28 ENCOUNTER — Encounter: Payer: Self-pay | Admitting: Internal Medicine

## 2021-04-28 NOTE — Telephone Encounter (Signed)
Please see this

## 2021-04-29 ENCOUNTER — Telehealth: Payer: Self-pay

## 2021-04-29 ENCOUNTER — Other Ambulatory Visit: Payer: Self-pay | Admitting: Internal Medicine

## 2021-04-29 MED ORDER — FENTANYL 25 MCG/HR TD PT72: MEDICATED_PATCH | TRANSDERMAL | 0 refills | Status: DC

## 2021-04-29 NOTE — Telephone Encounter (Signed)
Spoke to pt and informed him that per [facility] that he can use 2  MCG patches and to only take 1 oxycodone for severe and break thru pain.  Asked pt to document when he takes his medications and that we will discuss further at his next office visit.

## 2021-05-01 ENCOUNTER — Other Ambulatory Visit: Payer: Self-pay | Admitting: Internal Medicine

## 2021-05-01 DIAGNOSIS — I10 Essential (primary) hypertension: Secondary | ICD-10-CM

## 2021-05-04 DIAGNOSIS — E782 Mixed hyperlipidemia: Secondary | ICD-10-CM | POA: Diagnosis not present

## 2021-05-04 DIAGNOSIS — I10 Essential (primary) hypertension: Secondary | ICD-10-CM | POA: Diagnosis not present

## 2021-05-10 ENCOUNTER — Encounter: Payer: Self-pay | Admitting: Internal Medicine

## 2021-05-10 ENCOUNTER — Telehealth: Payer: Self-pay

## 2021-05-10 NOTE — Telephone Encounter (Signed)
Patient called back regarding vm he received to move u/s to 05-12-21. He stated he would not be able to due to having other appointment in [place]

## 2021-05-11 ENCOUNTER — Ambulatory Visit (INDEPENDENT_AMBULATORY_CARE_PROVIDER_SITE_OTHER): Payer: Medicare HMO | Admitting: Nurse Practitioner

## 2021-05-11 ENCOUNTER — Telehealth: Payer: Self-pay

## 2021-05-11 ENCOUNTER — Other Ambulatory Visit: Payer: Self-pay

## 2021-05-11 ENCOUNTER — Encounter: Payer: Self-pay | Admitting: Nurse Practitioner

## 2021-05-11 VITALS — BP 142/92 | HR 80 | Temp 98.9°F | Resp 16 | Ht 75.0 in | Wt 363.0 lb

## 2021-05-11 DIAGNOSIS — I10 Essential (primary) hypertension: Secondary | ICD-10-CM | POA: Diagnosis not present

## 2021-05-11 DIAGNOSIS — F429 Obsessive-compulsive disorder, unspecified: Secondary | ICD-10-CM | POA: Diagnosis not present

## 2021-05-11 DIAGNOSIS — G4733 Obstructive sleep apnea (adult) (pediatric): Secondary | ICD-10-CM | POA: Diagnosis not present

## 2021-05-11 DIAGNOSIS — G603 Idiopathic progressive neuropathy: Secondary | ICD-10-CM | POA: Diagnosis not present

## 2021-05-11 DIAGNOSIS — Z9989 Dependence on other enabling machines and devices: Secondary | ICD-10-CM

## 2021-05-11 DIAGNOSIS — F332 Major depressive disorder, recurrent severe without psychotic features: Secondary | ICD-10-CM | POA: Diagnosis not present

## 2021-05-11 DIAGNOSIS — E782 Mixed hyperlipidemia: Secondary | ICD-10-CM | POA: Diagnosis not present

## 2021-05-11 MED ORDER — LOSARTAN POTASSIUM 50 MG PO TABS: 50.0000 mg | ORAL_TABLET | Freq: Two times a day (BID) | ORAL | 1 refills | Status: DC

## 2021-05-11 NOTE — Telephone Encounter (Signed)
Sent community message to Jodi Geralds for set up of Overnight pulse oximetry.

## 2021-05-11 NOTE — Progress Notes (Signed)
[facility] [place], [place] [address]  Internal MEDICINE  Office Visit Note  Patient Name: Jesus Crosby  [id number]  [id number]  Date of Service: 05/12/2021  Chief Complaint  Patient presents with   Follow-up    Med review, cpap machine     HPI Laverna Peace presents for a follow up visit regarding medications, hypertension, CPAP, idiopathic progressive neuropathy. He has a history of anxiety, arthritis, cataracts, GERD, depression, OSA, on 2LPM home oxygen, stroke, and homcysteinuria. Numbness left arm.neuropathy, nodule on left sole of foot. He currently weighs 363 lbs with a BMI of 45.37. He has been using his CPAP machine which has been helping him at night. His CPAP machine numbers populate on an app on his smart phone and his number of apnea/hyponea events per hour has been decreasing. He is waking up not feeling as exhausted as he did before he started using CPAP. In 2019, he had an overnight oxygen study where he qualified for supplemental oxygen.   He has bilateral lower edema. He is currently taking amlodipine which can cause lower extremity swelling. His blood pressure is not under optimal control. He is also taking lasix and metoprolol tartrate.  His estimated 10-year ASCVD risk is 16%. He is currently on an appropriate dose of atorvastatin.    Current Medication: Outpatient Encounter Medications as of 05/11/2021  Medication Sig Note   ALPRAZolam (XANAX) 0.5 MG tablet Take 0.5 mg by mouth 4 (four) times daily. By dr reddy 03/25/2014: .    aspirin EC 81 MG tablet Take 81 mg by mouth at bedtime.     atorvastatin (LIPITOR) 10 MG tablet Take 1 tablet (10 mg total) by mouth at bedtime.    Betaine (CYSTADANE) POWD Take 8 Scoops by mouth 3 (three) times daily.    clotrimazole (LOTRIMIN) 1 % cream APPLY EXTERNALLY TO THE AFFECTED AREA TWICE DAILY    dicyclomine (BENTYL) 10 MG capsule Take 1 capsule (10 mg total) by mouth 4 (four) times daily -  before meals and at  bedtime. *Please call to make a follow up appointment for June    DULoxetine (CYMBALTA) 60 MG capsule Take 60 mg by mouth daily.    fentaNYL (DURAGESIC) 25 MCG/HR Use one patch q 72 hrs for severe pain    folic acid (FOLVITE) 338 MCG tablet Take 1,200 mcg by mouth at bedtime.     furosemide (LASIX) 20 MG tablet TAKE 1 TABLET(20 MG) BY MOUTH DAILY    gabapentin (NEURONTIN) 800 MG tablet Take 2 tablets by mouth 2 (two) times daily.     Metoprolol Succinate 100 MG CS24 Take 100 mg by mouth daily.    Metoprolol Succinate 50 MG CS24 Take 50 mg by mouth daily.    NONFORMULARY OR COMPOUNDED ITEM See pharmacy note    ondansetron (ZOFRAN) 8 MG tablet Take 1 tablet (8 mg total) by mouth 2 (two) times daily as needed for nausea or vomiting.    oxyCODONE (OXY IR/ROXICODONE) 5 MG immediate release tablet oxycodone 5 mg tablet  TAKE 1 TABLET BY MOUTH EVERY 6 HOURS AS NEEDED FOR PAIN    pantoprazole (PROTONIX) 40 MG tablet Take 1 tablet (40 mg total) by mouth daily.    pyridOXINE (VITAMIN B-6) 100 MG tablet Take 500 mg by mouth at bedtime.     sertraline (ZOLOFT) 100 MG tablet Take 200 mg by mouth daily.     vitamin B-12 (CYANOCOBALAMIN) 1000 MCG tablet Take 1,000 mcg by mouth at bedtime.    [  DISCONTINUED] amLODipine (NORVASC) 5 MG tablet TAKE 1 TABLET BY MOUTH TWICE DAILY    [DISCONTINUED] losartan (COZAAR) 50 MG tablet TAKE 1/2 TABLET(25 MG) BY MOUTH AT BEDTIME    [DISCONTINUED] losartan (COZAAR) 50 MG tablet Take 1 tablet (50 mg total) by mouth in the morning and at bedtime.    [DISCONTINUED] metoprolol tartrate (LOPRESSOR) 50 MG tablet Take 1.5 tablets (75 mg total) by mouth 2 (two) times daily.    Ferrous Sulfate Dried 45 MG TBCR Take 45 mg by mouth at bedtime.    losartan (COZAAR) 50 MG tablet Take 2 tablets (100 mg) in the morning and take 1 tablet (50mg ) at bedtime.    [DISCONTINUED] ferrous gluconate (FERGON) 324 MG tablet Take 1 tablet (324 mg total) by mouth daily with breakfast.    No  facility-administered encounter medications on file as of 05/11/2021.    Surgical History: Past Surgical History:  Procedure Laterality Date   COLONOSCOPY  2020   COLONOSCOPY WITH PROPOFOL N/A 07/30/2015   Procedure: COLONOSCOPY WITH PROPOFOL;  Surgeon: [name], MD;  Location: [facility];  Service: Endoscopy;  Laterality: N/A;   DERMABRASION OF FACE     due to acne scars   distal radius fracture of right hand (plates, screws, and pins)  2011   ESOPHAGOGASTRODUODENOSCOPY (EGD) WITH PROPOFOL N/A 07/30/2015   Procedure: ESOPHAGOGASTRODUODENOSCOPY (EGD) WITH PROPOFOL;  Surgeon: [name], MD;  Location: [facility];  Service: Endoscopy;  Laterality: N/A;   EYE SURGERY     LASER + SURG BIL    GAS/FLUID EXCHANGE Right 09/17/2013   Procedure: GAS/FLUID EXCHANGE;  Surgeon: Hayden Pedro, MD;  Location: [place];  Service: Ophthalmology;  Laterality: Right;   gastric sleeve surgery  08/17/2016   HIP ARTHROPLASTY Left    INTRAOCULAR LENS INSERTION Bilateral    lens disease due to [place] Left    THR   NASAL SEPTUM SURGERY  march 2016   @ [place] VITRECTOMY Right 09/17/2013   Procedure: PARS PLANA VITRECTOMY WITH 25G REMOVAL/SUTURE SECONDARY INTRAOCULAR LENS;  Surgeon: Hayden Pedro, MD;  Location: [place];  Service: Ophthalmology;  Laterality: Right;   PHOTOCOAGULATION Right 09/17/2013   Procedure: PHOTOCOAGULATION;  Surgeon: Hayden Pedro, MD;  Location: [place];  Service: Ophthalmology;  Laterality: Right;  HEADSCOPE LASER   PULSE GENERATOR IMPLANT N/A 02/13/2019   Procedure: UNILATERAL PULSE GENERATOR IMPLANT;  Surgeon: Meade Maw, MD;  Location: [facility];  Service: Neurosurgery;  Laterality: N/A;   scapholunate ligament reconstruction of right hand  6440   UMBILICAL HERNIA REPAIR N/A 05/07/2015   Procedure: HERNIA REPAIR UMBILICAL ADULT;  Surgeon: Florene Glen, MD;  Location: [facility];  Service: General;  Laterality: N/A;   UPPER  GASTROINTESTINAL ENDOSCOPY  2012   WRIST FUSION Left    WRIST SURGERY Right     Medical History: Past Medical History:  Diagnosis Date   Anxiety    Arthritis    knees,Right wrist   Avascular necrosis ([place])    Brain bleed ([place])    Cataract    bilateral repair with lens implants   Depression    Dysplastic nevus 03/22/2018    left mid to upper back paraspinal 2.5cm lat to spine -  DYSPLASTIC COMPOUND NEVUS WITH MILD ATYPIA, LIMITED MARGINS FREE   Family history of adverse reaction to anesthesia    n/v-mom   GERD (gastroesophageal reflux disease)    Homocystinuria (HCC)    Hypertension  Idiopathic progressive polyneuropathy    Lens disease    Neuromuscular disorder (HCC)    Obesity, Class II, BMI 35-39.9    OCD (obsessive compulsive disorder)    Oxygen deficiency    2 liters   Paresthesia 06/10/2015   Plantar fasciitis    Sleep apnea    cpap   Stroke [facility])    with brain bleed at age 16-XW deficits   Umbilical hernia     Family History: Family History  Problem Relation Age of Onset   Breast cancer Mother    Hypertension Mother    Colon polyps Mother    Hypertension Father    Colon polyps Father    Stroke Maternal Grandmother    Stroke Maternal Grandfather    Stroke Paternal Grandmother    Stroke Paternal Grandfather    Stroke Other    Colon cancer Neg Hx    Esophageal cancer Neg Hx    Rectal cancer Neg Hx    Stomach cancer Neg Hx     Social History   Socioeconomic History   Marital status: Single    Spouse name: n/a   Number of children: 0   Years of education: college   Highest education level: Not on file  Occupational History   Occupation: unemployed/disabled    Comment: Lexicographer  Tobacco Use   Smoking status: Never Smoker   Smokeless tobacco: Never Used  Scientific laboratory technician Use: Never used  Substance and Sexual Activity   Alcohol use: No    Alcohol/week: 0.0 standard drinks   Drug use: No   Sexual activity: Not on file  Other  Topics Concern   Not on file  Social History Narrative   Lives alone. Parents live nearby.  Applying for disability (OCD) due to wrist injury, hearing upcoming.      Patient drinks 4-5 cups of caffeine daily.   Patient is right handed.   Social Determinants of Health   Financial Resource Strain: Low Risk    Difficulty of Paying Living Expenses: Not very hard  Food Insecurity: Not on file  Transportation Needs: Not on file  Physical Activity: Not on file  Stress: Not on file  Social Connections: Not on file  Intimate Partner Violence: Not on file      Review of Systems  Constitutional:  Positive for fatigue and unexpected weight change. Negative for chills.  HENT:  Positive for postnasal drip. Negative for congestion, rhinorrhea, sneezing and sore throat.   Eyes:  Negative for redness.  Respiratory:  Positive for cough, shortness of breath and wheezing. Negative for chest tightness.   Cardiovascular:  Positive for leg swelling. Negative for chest pain and palpitations.  Gastrointestinal:  Negative for abdominal pain, constipation, diarrhea, nausea and vomiting.  Genitourinary:  Negative for dysuria and frequency.  Musculoskeletal:  Positive for arthralgias. Negative for back pain, joint swelling and neck pain.  Skin:  Negative for rash.  Neurological:  Positive for numbness. Negative for tremors.  Hematological:  Negative for adenopathy. Does not bruise/bleed easily.  Psychiatric/Behavioral:  Negative for behavioral problems (Depression), sleep disturbance and suicidal ideas. The patient is not nervous/anxious.    Vital Signs: BP (!) 142/92   Pulse 80   Temp 98.9 F (37.2 C)   Resp 16   Ht 6\' 3"  (1.905 m)   Wt (!) 363 lb (164.7 kg)   SpO2 92%   BMI 45.37 kg/m    Physical Exam Vitals reviewed.  Constitutional:  General: He is not in acute distress.    Appearance: Normal appearance. He is not ill-appearing.  Cardiovascular:     Rate and Rhythm: Normal rate and  regular rhythm.     Pulses: Normal pulses.     Heart sounds: Normal heart sounds.  Pulmonary:     Effort: Pulmonary effort is normal.     Breath sounds: Normal breath sounds.  Musculoskeletal:     Right lower leg: No tenderness or bony tenderness. 1+ Edema present.     Left lower leg: No tenderness or bony tenderness. 1+ Edema present.     Right ankle: Swelling present.     Left ankle: Swelling present.     Right foot: Swelling and bunion present. No tenderness.     Left foot: Swelling and bunion present. No tenderness.  Feet:     Right foot:     Protective Sensation: 4 sites tested. 0 sites sensed.     Toenail Condition: Right toenails are normal.     Left foot:     Protective Sensation: 4 sites tested. 0 sites sensed.     Toenail Condition: Left toenails are normal.  Skin:    General: Skin is warm and dry.     Capillary Refill: Capillary refill takes less than 2 seconds.  Neurological:     Mental Status: He is alert and oriented to person, place, and time.     Assessment/Plan:  1. Essential hypertension, benign Blood pressure is not well controlled. Amlodipine discontinued due to bilateral lower extremity edema. Losartan dose increased. Metoprolol tartrate discontinued. Metoprolol succinate prescribed for a total of 150 mg daily which is equivalent to total amount of metoprolol tartrate the patient was on per day.  - Metoprolol Succinate 100 MG CS24; Take 100 mg by mouth daily.  Dispense: 30 capsule; Refill: 0 - Metoprolol Succinate 50 MG CS24; Take 50 mg by mouth daily.  Dispense: 30 capsule; Refill: 0 - losartan (COZAAR) 50 MG tablet; Take 2 tablets (100 mg) in the morning and take 1 tablet (50mg ) at bedtime.  Dispense: 270 tablet; Refill: 1  2. Idiopathic progressive neuropathy Severe bilateral neuropathy in feet. Patient unable to feel sensations of touch or pain due to severe numbness. The importance of wearing shoes at all times was stressed to the patient and he  acknowledged understanding. Left foot also has a cyst-like mass, possibly a small plantar fibroma. It is not bothering the patient now, will consider podiatry referral if it becomes an issue.   3. OSA on CPAP Patient is doing well on CPAP, sleep and breathing is improving, early morning fatigue is decreasing. Patient was approved for supplemental oxygen after an overnight pulse oximetry test in 2019 but insurance would not cover supplemental oxygen without the sleep study and subsequent CPAP titration study. Both studies were done earlier this year but the results of the overnight pulse oximetry study were from 3 years ago so the overnight pulse oximetry needs to be repeated with him on his CPAP machine. Overnight pulse oximetry study ordered.  - Pulse oximetry, overnight; Future  4. Mixed hyperlipidemia Estimated 10-year ASCVD risk is 16%. He is currently on the optimal dose of atorvastatin for moderate intensity statin therapy. Will recheck lipid panel at his next office visit.    General Counseling: tavoris brisk understanding of the findings of todays visit and agrees with plan of treatment. I have discussed any further diagnostic evaluation that may be needed or ordered today. We also reviewed his medications today.  he has been encouraged to call the office with any questions or concerns that should arise related to todays visit.    Orders Placed This Encounter  Procedures   Pulse oximetry, overnight    Meds ordered this encounter  Medications   DISCONTD: losartan (COZAAR) 50 MG tablet    Sig: Take 1 tablet (50 mg total) by mouth in the morning and at bedtime.    Dispense:  180 tablet    Refill:  1   Metoprolol Succinate 100 MG CS24    Sig: Take 100 mg by mouth daily.    Dispense:  30 capsule    Refill:  0   Metoprolol Succinate 50 MG CS24    Sig: Take 50 mg by mouth daily.    Dispense:  30 capsule    Refill:  0   losartan (COZAAR) 50 MG tablet    Sig: Take 2 tablets (100 mg)  in the morning and take 1 tablet (50mg ) at bedtime.    Dispense:  270 tablet    Refill:  1    Order Specific Question:   Supervising Provider    Answer:   [name] [[id number]]    Return in about 4 weeks (around 06/08/2021) for F/U, BP check, med refill, Regla Fitzgibbon PCP.   Total time spent:30 Minutes Time spent includes review of chart, medications, test results, and follow up plan with the patient.   [place] Controlled Substance Database was reviewed by me.  This patient was seen by Jonetta Osgood, FNP-C in collaboration with Dr. Clayborn Bigness as a part of collaborative care agreement.   Clarabelle Oscarson R. Valetta Fuller, MSN, FNP-C Internal medicine

## 2021-05-12 ENCOUNTER — Encounter: Payer: Self-pay | Admitting: Nurse Practitioner

## 2021-05-12 MED ORDER — METOPROLOL SUCCINATE 50 MG PO CS24: 50.0000 mg | EXTENDED_RELEASE_CAPSULE | Freq: Every day | ORAL | 0 refills | Status: DC

## 2021-05-12 MED ORDER — LOSARTAN POTASSIUM 50 MG PO TABS: ORAL_TABLET | ORAL | 1 refills | Status: DC

## 2021-05-12 MED ORDER — METOPROLOL SUCCINATE 100 MG PO CS24: 100.0000 mg | EXTENDED_RELEASE_CAPSULE | Freq: Every day | ORAL | 0 refills | Status: DC

## 2021-05-14 ENCOUNTER — Other Ambulatory Visit: Payer: Self-pay

## 2021-05-14 ENCOUNTER — Other Ambulatory Visit: Payer: Self-pay | Admitting: Nurse Practitioner

## 2021-05-14 MED ORDER — METOPROLOL SUCCINATE ER 50 MG PO TB24: 50.0000 mg | ORAL_TABLET | Freq: Every day | ORAL | 3 refills | Status: DC

## 2021-05-14 MED ORDER — METOPROLOL SUCCINATE ER 100 MG PO TB24
100.0000 mg | ORAL_TABLET | Freq: Every day | ORAL | 3 refills | Status: DC
Start: 2021-05-14 — End: 2021-05-14

## 2021-05-16 ENCOUNTER — Other Ambulatory Visit: Payer: Self-pay | Admitting: Nurse Practitioner

## 2021-05-16 ENCOUNTER — Other Ambulatory Visit: Payer: Self-pay | Admitting: Gastroenterology

## 2021-05-16 DIAGNOSIS — Z8673 Personal history of transient ischemic attack (TIA), and cerebral infarction without residual deficits: Secondary | ICD-10-CM

## 2021-05-16 DIAGNOSIS — I10 Essential (primary) hypertension: Secondary | ICD-10-CM

## 2021-05-16 MED ORDER — CHLORTHALIDONE 25 MG PO TABS: 12.5000 mg | ORAL_TABLET | Freq: Every day | ORAL | 1 refills | Status: DC

## 2021-05-16 MED ORDER — AMLODIPINE BESY-BENAZEPRIL HCL 5-10 MG PO CAPS: 1.0000 | ORAL_CAPSULE | Freq: Every day | ORAL | 2 refills | Status: DC

## 2021-05-16 NOTE — Progress Notes (Signed)
Earlier this week, Jesus Crosby was seen at the clinic for a follow-up visit.  He has been having significant lower extremity edema bilaterally and so his amlodipine was discontinued and his losartan dose was increased along with a temporary increase in his Lasix to encourage diuresis.  Since then, because blood pressure has been more significantly elevated and there has been no change in the bilateral lower extremity edema.  2 days ago his metoprolol tartrate was switched to the extended release metoprolol succinate with the same a 24-hour dose.  His losartan was also increased again to help control his blood pressure.  Due to there being no change in the lower extremity edema and the resistance that his blood pressure is having to the losartan, we will discontinue losartan.  Jesus Crosby has not tried any ACE inhibitor's as noted during chart review and there is no other reason why he cannot restart amlodipine.  In an effort to minimize the amount of pills he must take still that medication compliance is not affected, amlodipine-benazepril has been prescribed and sent to the pharmacy along with chlorthalidone 12.5 mg due to the persistent fluid retention and lower extremity edema. He will continue to take the Lasix as ordered.  He has a history of ischemic stroke and obstructive sleep apnea.  He is already prescribed 81 mg of aspirin daily for secondary stroke prevention.  He has an echocardiogram ordered and scheduled to be done on June 15 this week.  His previous echocardiogram from June 2021 was normal except for mild tricuspid regurgitation.  Jesus Crosby has not had an ultrasound of his carotid arteries since the year 2019.  We will order a bilateral carotid ultrasound to be performed at [place].  Ideal plan is to see the patient this week to also check his A1c and perform an electrocardiogram since it has been over a year since both of these were done.  He is obese with a BMI of 45.37.  A metabolic test was done in early May  2022 to determine what his caloric restriction should be.  Although limiting sodium intake is important in regards to hypertension the patient has a history of hyponatremia and his sodium level is still borderline low so labs will be ordered to reevaluate. He also had a sleep study and CPAP titration whic were done earlier this year and the patient reports some improvement with CPAP at night.   Summary: Continue metoprolol succinate 150 mg daily Continue current dose of lasix Stop losartan Start amlodipine-benazepril 5-10 mg daily Start chlorthalidone 12.5 mg every evening

## 2021-05-18 ENCOUNTER — Telehealth: Payer: Self-pay

## 2021-05-18 ENCOUNTER — Encounter: Payer: Self-pay | Admitting: Nurse Practitioner

## 2021-05-18 NOTE — Telephone Encounter (Signed)
-----   Message from Jimmye Norman, [place] sent at 05/17/2021  3:40 PM EDT ----- Regarding: FW: prior authorization  ----- Message ----- From: Jonetta Osgood, NP Sent: 05/16/2021  11:35 PM EDT To: Jimmye Norman, CMA Subject: prior authorization                            Hi Alex,   I sent a prescription for amlodipine-benazepril 5mg -10mg , 1 capsule daily and Alexio notified me that his insurance told him he needs a prior authorization. I also prescribed chlorthalidone but I think that one went through just fine.   Thank you, Alyssa

## 2021-05-18 NOTE — Telephone Encounter (Signed)
Spoke to pharmacy and they informed me that the medication amlodipine-benazepril 5mg -10mg , didn't need a PA that it went through on his insurance.

## 2021-05-18 NOTE — Telephone Encounter (Signed)
Left vm to do covid screening for 05/19/21 appointment-Toni

## 2021-05-19 ENCOUNTER — Ambulatory Visit: Payer: Medicare HMO

## 2021-05-19 ENCOUNTER — Other Ambulatory Visit: Payer: Self-pay

## 2021-05-19 DIAGNOSIS — R0602 Shortness of breath: Secondary | ICD-10-CM | POA: Diagnosis not present

## 2021-05-19 DIAGNOSIS — I071 Rheumatic tricuspid insufficiency: Secondary | ICD-10-CM

## 2021-05-20 ENCOUNTER — Telehealth: Payer: Self-pay

## 2021-05-20 ENCOUNTER — Other Ambulatory Visit: Payer: Self-pay | Admitting: Adult Health

## 2021-05-20 DIAGNOSIS — G8929 Other chronic pain: Secondary | ICD-10-CM | POA: Diagnosis not present

## 2021-05-20 DIAGNOSIS — G4731 Primary central sleep apnea: Secondary | ICD-10-CM | POA: Diagnosis not present

## 2021-05-20 DIAGNOSIS — G4733 Obstructive sleep apnea (adult) (pediatric): Secondary | ICD-10-CM | POA: Diagnosis not present

## 2021-05-20 DIAGNOSIS — M5441 Lumbago with sciatica, right side: Secondary | ICD-10-CM | POA: Diagnosis not present

## 2021-05-20 DIAGNOSIS — M5442 Lumbago with sciatica, left side: Secondary | ICD-10-CM | POA: Diagnosis not present

## 2021-05-20 DIAGNOSIS — G622 Polyneuropathy due to other toxic agents: Secondary | ICD-10-CM | POA: Diagnosis not present

## 2021-05-20 NOTE — Telephone Encounter (Signed)
Left vm to do covid screening for 05/21/21 appointment-Toni

## 2021-05-21 ENCOUNTER — Ambulatory Visit (INDEPENDENT_AMBULATORY_CARE_PROVIDER_SITE_OTHER): Payer: Medicare HMO | Admitting: Nurse Practitioner

## 2021-05-21 ENCOUNTER — Other Ambulatory Visit: Payer: Self-pay

## 2021-05-21 ENCOUNTER — Telehealth: Payer: Self-pay | Admitting: Pharmacist

## 2021-05-21 ENCOUNTER — Encounter: Payer: Self-pay | Admitting: Nurse Practitioner

## 2021-05-21 VITALS — BP 154/99 | HR 82 | Temp 98.4°F | Resp 16 | Ht 75.0 in | Wt 359.2 lb

## 2021-05-21 DIAGNOSIS — R7301 Impaired fasting glucose: Secondary | ICD-10-CM

## 2021-05-21 DIAGNOSIS — I10 Essential (primary) hypertension: Secondary | ICD-10-CM

## 2021-05-21 DIAGNOSIS — B372 Candidiasis of skin and nail: Secondary | ICD-10-CM | POA: Diagnosis not present

## 2021-05-21 DIAGNOSIS — G5772 Causalgia of left lower limb: Secondary | ICD-10-CM | POA: Diagnosis not present

## 2021-05-21 DIAGNOSIS — R0602 Shortness of breath: Secondary | ICD-10-CM | POA: Diagnosis not present

## 2021-05-21 DIAGNOSIS — G5771 Causalgia of right lower limb: Secondary | ICD-10-CM

## 2021-05-21 LAB — POCT GLYCOSYLATED HEMOGLOBIN (HGB A1C): Hemoglobin A1C: 5.3 % (ref 4.0–5.6)

## 2021-05-21 MED ORDER — METOPROLOL TARTRATE 50 MG PO TABS
75.0000 mg | ORAL_TABLET | Freq: Two times a day (BID) | ORAL | 3 refills | Status: AC
Start: 2021-05-21 — End: ?

## 2021-05-21 MED ORDER — CLOTRIMAZOLE 1 % EX CREA: TOPICAL_CREAM | CUTANEOUS | 1 refills | Status: AC

## 2021-05-21 MED ORDER — FENTANYL 25 MCG/HR TD PT72: MEDICATED_PATCH | TRANSDERMAL | 0 refills | Status: DC

## 2021-05-21 NOTE — Progress Notes (Addendum)
Chronic Care Management Pharmacy Assistant   Name: Jesus Crosby.  MRN: [id number] DOB: 12/20/1965  Reason for Encounter: General Disease State Call   Conditions to be addressed/monitored: HTN, DDD/Arthritis, HLD,Anxiety/Depression,Pre-Diabetes, OCD  Recent office visits:  05/21/21 Antony Madura, NP. For follow-up. STARTED Metoprolol 75 mg and STOPPED Metoprolol 100 mg and 50 mg. 05/11/21 Antony Madura, NP. For follow-up. STARTED Metoprolol 100 mg daily and 50 mg daily. CHANGED Losartan Potassium to 50 mg 2 tablets in the morning and 1 tablet at bedtime. STOPPED Amlodipine, Ferrous Gluconate, and Metoprolol 75 mg.  Recent consult visits:  05/20/21 Neurosurgery Ricard Dillon, MD  For back pain. No medication changes.  Hospital visits:  None since 04/20/21  Medications: Outpatient Encounter Medications as of 05/21/2021  Medication Sig Note   ALPRAZolam (XANAX) 0.5 MG tablet Take 0.5 mg by mouth 4 (four) times daily. By dr reddy 03/25/2014: .    amLODipine-benazepril (LOTREL) 5-10 MG capsule Take 1 capsule by mouth daily. In the morning.    aspirin EC 81 MG tablet Take 81 mg by mouth at bedtime.     atorvastatin (LIPITOR) 10 MG tablet Take 1 tablet (10 mg total) by mouth at bedtime.    Betaine (CYSTADANE) POWD Take 8 Scoops by mouth 3 (three) times daily.    chlorthalidone (HYGROTON) 25 MG tablet Take 0.5 tablets (12.5 mg total) by mouth daily. Take in the evening, if causing frequent urination, may switch to taking it in the morning.    clotrimazole (LOTRIMIN) 1 % cream APPLY EXTERNALLY TO THE AFFECTED AREA TWICE DAILY    dicyclomine (BENTYL) 10 MG capsule Take 1 capsule (10 mg total) by mouth 4 (four) times daily -  before meals and at bedtime. *Please call to make a follow up appointment for June    DULoxetine (CYMBALTA) 60 MG capsule Take 60 mg by mouth daily.    fentaNYL (DURAGESIC) 25 MCG/HR Use one patch q 72 hrs for severe pain    Ferrous Sulfate Dried 45  MG TBCR Take 45 mg by mouth at bedtime.    folic acid (FOLVITE) 299 MCG tablet Take 1,200 mcg by mouth at bedtime.     furosemide (LASIX) 20 MG tablet TAKE 1 TABLET(20 MG) BY MOUTH DAILY    gabapentin (NEURONTIN) 800 MG tablet Take 2 tablets by mouth 2 (two) times daily.     metoprolol tartrate (LOPRESSOR) 50 MG tablet Take 1.5 tablets (75 mg total) by mouth 2 (two) times daily.    NONFORMULARY OR COMPOUNDED ITEM See pharmacy note    ondansetron (ZOFRAN) 8 MG tablet Take 1 tablet (8 mg total) by mouth 2 (two) times daily as needed for nausea or vomiting.    oxyCODONE (OXY IR/ROXICODONE) 5 MG immediate release tablet oxycodone 5 mg tablet  TAKE 1 TABLET BY MOUTH EVERY 6 HOURS AS NEEDED FOR PAIN    pantoprazole (PROTONIX) 40 MG tablet TAKE 1 TABLET(40 MG) BY MOUTH DAILY    pyridOXINE (VITAMIN B-6) 100 MG tablet Take 500 mg by mouth at bedtime.     sertraline (ZOLOFT) 100 MG tablet Take 200 mg by mouth daily.     vitamin B-12 (CYANOCOBALAMIN) 1000 MCG tablet Take 1,000 mcg by mouth at bedtime.    No facility-administered encounter medications on file as of 05/21/2021.   GEN CALL: Patient stated his pain is improving/getting a little bit better. He stated he is currently on 25 MCG patches and have been for about 3 months now. He stated he  does not have questions or concerns about his other medications at this time.   Star Rating Drugs: Atorvastatin 10 mg 90 DS 10/12/20  Follow-Up:Pharmacist Review  Charlann Lange, RMA Clinical Pharmacist Assistant [phone]  10 minutes spent in review, coordination, and documentation.  Reviewed by: Beverly Milch, PharmD Clinical Pharmacist [facility] [phone]

## 2021-05-21 NOTE — Progress Notes (Signed)
[facility] [place], [place] [address]  Internal MEDICINE  Office Visit Note  Patient Name: Jesus Crosby  [id number]  [id number]  Date of Service: 05/30/2021  Chief Complaint  Patient presents with   Follow-up    EKG, Check A1C, change bp meds   Hypertension    HPI [name] presents to the clinic for an EKG, check A1C and discuss blood pressure medications.  -EKG was done if office showing anterolateral ST elevation repolarization variant and that it is most likely normal. After reviewing the EKG, Dr. Clayborn Bigness was consulted to review the EKG for a second opinion. EKG is determined to be a normal variant. There were also technical difficulty due to V6 lead not staying on the skin and having to be held in place.  -A1C checked to rule out diabetes, A1C is 5.3.  His blood pressure is still high depsite making several changes in his medications. He is asking to go back to taking his metoprolol, losartan and amlodipine the way he was taking it before. This may not fix the problem if his blood pressure is becoming more resistive.     Current Medication: Outpatient Encounter Medications as of 05/21/2021  Medication Sig Note   metoprolol tartrate (LOPRESSOR) 50 MG tablet Take 1.5 tablets (75 mg total) by mouth 2 (two) times daily.    ALPRAZolam (XANAX) 0.5 MG tablet Take 0.5 mg by mouth 4 (four) times daily. By dr reddy 03/25/2014: .    amLODipine-benazepril (LOTREL) 5-10 MG capsule Take 1 capsule by mouth daily. In the morning.    aspirin EC 81 MG tablet Take 81 mg by mouth at bedtime.     atorvastatin (LIPITOR) 10 MG tablet Take 1 tablet (10 mg total) by mouth at bedtime.    Betaine (CYSTADANE) POWD Take 8 [place] by mouth 3 (three) times daily.    chlorthalidone (HYGROTON) 25 MG tablet Take 0.5 tablets (12.5 mg total) by mouth daily. Take in the evening, if causing frequent urination, may switch to taking it in the morning.    clotrimazole (LOTRIMIN) 1 % cream  APPLY EXTERNALLY TO THE AFFECTED AREA TWICE DAILY    dicyclomine (BENTYL) 10 MG capsule Take 1 capsule (10 mg total) by mouth 4 (four) times daily -  before meals and at bedtime. *Please call to make a follow up appointment for June    DULoxetine (CYMBALTA) 60 MG capsule Take 60 mg by mouth daily.    fentaNYL (DURAGESIC) 25 MCG/HR Use one patch q 72 hrs for severe pain    Ferrous Sulfate Dried 45 MG TBCR Take 45 mg by mouth at bedtime.    folic acid (FOLVITE) 295 MCG tablet Take 1,200 mcg by mouth at bedtime.     furosemide (LASIX) 20 MG tablet TAKE 1 TABLET(20 MG) BY MOUTH DAILY    gabapentin (NEURONTIN) 800 MG tablet Take 2 tablets by mouth 2 (two) times daily.     NONFORMULARY OR COMPOUNDED ITEM See pharmacy note    ondansetron (ZOFRAN) 8 MG tablet Take 1 tablet (8 mg total) by mouth 2 (two) times daily as needed for nausea or vomiting.    oxyCODONE (OXY IR/ROXICODONE) 5 MG immediate release tablet oxycodone 5 mg tablet  TAKE 1 TABLET BY MOUTH EVERY 6 HOURS AS NEEDED FOR PAIN    pantoprazole (PROTONIX) 40 MG tablet TAKE 1 TABLET(40 MG) BY MOUTH DAILY    pyridOXINE (VITAMIN B-6) 100 MG tablet Take 500 mg by mouth at bedtime.  sertraline (ZOLOFT) 100 MG tablet Take 200 mg by mouth daily.     vitamin B-12 (CYANOCOBALAMIN) 1000 MCG tablet Take 1,000 mcg by mouth at bedtime.    [DISCONTINUED] clotrimazole (LOTRIMIN) 1 % cream APPLY EXTERNALLY TO THE AFFECTED AREA TWICE DAILY    [DISCONTINUED] fentaNYL (DURAGESIC) 25 MCG/HR Use one patch q 72 hrs for severe pain    [DISCONTINUED] metoprolol succinate (TOPROL-XL) 100 MG 24 hr tablet TAKE 1 TABLET BY MOUTH EVERY DAY    [DISCONTINUED] metoprolol succinate (TOPROL-XL) 50 MG 24 hr tablet TAKE 1 TABLET BY MOUTH EVERY DAY    No facility-administered encounter medications on file as of 05/21/2021.    Surgical History: Past Surgical History:  Procedure Laterality Date   COLONOSCOPY  2020   COLONOSCOPY WITH PROPOFOL N/A 07/30/2015   Procedure:  COLONOSCOPY WITH PROPOFOL;  Surgeon: [name], MD;  Location: [facility];  Service: Endoscopy;  Laterality: N/A;   DERMABRASION OF FACE     due to acne scars   distal radius fracture of right hand (plates, screws, and pins)  2011   ESOPHAGOGASTRODUODENOSCOPY (EGD) WITH PROPOFOL N/A 07/30/2015   Procedure: ESOPHAGOGASTRODUODENOSCOPY (EGD) WITH PROPOFOL;  Surgeon: [name], MD;  Location: [facility];  Service: Endoscopy;  Laterality: N/A;   EYE SURGERY     LASER + SURG BIL    GAS/FLUID EXCHANGE Right 09/17/2013   Procedure: GAS/FLUID EXCHANGE;  Surgeon: Hayden Pedro, MD;  Location: [place];  Service: Ophthalmology;  Laterality: Right;   gastric sleeve surgery  08/17/2016   HIP ARTHROPLASTY Left    INTRAOCULAR LENS INSERTION Bilateral    lens disease due to [place] Left    THR   NASAL SEPTUM SURGERY  march 2016   @ [place] VITRECTOMY Right 09/17/2013   Procedure: PARS PLANA VITRECTOMY WITH 25G REMOVAL/SUTURE SECONDARY INTRAOCULAR LENS;  Surgeon: Hayden Pedro, MD;  Location: [place];  Service: Ophthalmology;  Laterality: Right;   PHOTOCOAGULATION Right 09/17/2013   Procedure: PHOTOCOAGULATION;  Surgeon: Hayden Pedro, MD;  Location: [place];  Service: Ophthalmology;  Laterality: Right;  HEADSCOPE LASER   PULSE GENERATOR IMPLANT N/A 02/13/2019   Procedure: UNILATERAL PULSE GENERATOR IMPLANT;  Surgeon: Meade Maw, MD;  Location: [facility];  Service: Neurosurgery;  Laterality: N/A;   scapholunate ligament reconstruction of right hand  1610   UMBILICAL HERNIA REPAIR N/A 05/07/2015   Procedure: HERNIA REPAIR UMBILICAL ADULT;  Surgeon: Florene Glen, MD;  Location: [facility];  Service: General;  Laterality: N/A;   UPPER GASTROINTESTINAL ENDOSCOPY  2012   WRIST FUSION Left    WRIST SURGERY Right     Medical History: Past Medical History:  Diagnosis Date   Anxiety    Arthritis    knees,Right wrist   Avascular necrosis ([place])     Brain bleed ([place])    Cataract    bilateral repair with lens implants   Depression    Dysplastic nevus 03/22/2018    left mid to upper back paraspinal 2.5cm lat to spine -  DYSPLASTIC COMPOUND NEVUS WITH MILD ATYPIA, LIMITED MARGINS FREE   Family history of adverse reaction to anesthesia    n/v-mom   GERD (gastroesophageal reflux disease)    Homocystinuria (HCC)    Hypertension    Idiopathic progressive polyneuropathy    Lens disease    Neuromuscular disorder (HCC)    Obesity, Class II, BMI 35-39.9    OCD (obsessive compulsive disorder)    Oxygen  deficiency    2 liters   Paresthesia 06/10/2015   Plantar fasciitis    Sleep apnea    cpap   Stroke [facility])    with brain bleed at age 89-QI deficits   Umbilical hernia     Family History: Family History  Problem Relation Age of Onset   Breast cancer Mother    Hypertension Mother    Colon polyps Mother    Hypertension Father    Colon polyps Father    Stroke Maternal Grandmother    Stroke Maternal Grandfather    Stroke Paternal Grandmother    Stroke Paternal Grandfather    Stroke Other    Colon cancer Neg Hx    Esophageal cancer Neg Hx    Rectal cancer Neg Hx    Stomach cancer Neg Hx     Social History   Socioeconomic History   Marital status: Single    Spouse name: n/a   Number of children: 0   Years of education: college   Highest education level: Not on file  Occupational History   Occupation: unemployed/disabled    Comment: Lexicographer  Tobacco Use   Smoking status: Never   Smokeless tobacco: Never  Vaping Use   Vaping Use: Never used  Substance and Sexual Activity   Alcohol use: No    Alcohol/week: 0.0 standard drinks   Drug use: No   Sexual activity: Not on file  Other Topics Concern   Not on file  Social History Narrative   Lives alone. Parents live nearby.  Applying for disability (OCD) due to wrist injury, hearing upcoming.      Patient drinks 4-5 cups of caffeine daily.   Patient is right  handed.   Social Determinants of Health   Financial Resource Strain: Low Risk    Difficulty of Paying Living Expenses: Not very hard  Food Insecurity: Not on file  Transportation Needs: Not on file  Physical Activity: Not on file  Stress: Not on file  Social Connections: Not on file  Intimate Partner Violence: Not on file      Review of Systems  Constitutional:  Negative for chills, fatigue and unexpected weight change.  HENT:  Negative for congestion, rhinorrhea, sneezing and sore throat.   Eyes:  Negative for redness.  Respiratory:  Negative for cough, chest tightness and shortness of breath.   Cardiovascular:  Negative for chest pain and palpitations.  Gastrointestinal:  Negative for abdominal pain, constipation, diarrhea, nausea and vomiting.  Genitourinary:  Negative for dysuria and frequency.  Musculoskeletal:  Negative for arthralgias, back pain, joint swelling and neck pain.  Skin:  Negative for rash.  Neurological:  Positive for numbness (left axilla, left upper arm). Negative for dizziness, tremors, light-headedness and headaches.  Hematological:  Negative for adenopathy. Does not bruise/bleed easily.  Psychiatric/Behavioral:  Negative for behavioral problems (Depression), sleep disturbance and suicidal ideas. The patient is not nervous/anxious.    Vital Signs: BP (!) 154/99   Pulse 82   Temp 98.4 F (36.9 C)   Resp 16   Ht 6\' 3"  (1.905 m)   Wt (!) 359 lb 3.2 oz (162.9 kg)   SpO2 93%   BMI 44.90 kg/m    Physical Exam Vitals reviewed.  Constitutional:      General: He is not in acute distress.    Appearance: Normal appearance. He is well-developed. He is obese. He is not ill-appearing or diaphoretic.  HENT:     Head: Normocephalic and atraumatic.  Neck:  Thyroid: No thyromegaly.     Vascular: No JVD.     Trachea: No tracheal deviation.  Cardiovascular:     Rate and Rhythm: Normal rate and regular rhythm.     Pulses: Normal pulses.     Heart sounds:  Normal heart sounds. No murmur heard.   No friction rub. No gallop.  Pulmonary:     Effort: Pulmonary effort is normal. No respiratory distress.     Breath sounds: Normal breath sounds. No wheezing or rales.  Chest:     Chest wall: No tenderness.  Skin:    General: Skin is warm and dry.     Capillary Refill: Capillary refill takes less than 2 seconds.  Neurological:     Mental Status: He is alert and oriented to person, place, and time.  Psychiatric:        Mood and Affect: Mood normal.        Behavior: Behavior normal.    Assessment/Plan: 1. Primary hypertension High blood pressure that has been resistant to recent medication changes and adjustments. Metoprolol switched from succinate to metoprolol tartrate 75 mg twice daily.  - metoprolol tartrate (LOPRESSOR) 50 MG tablet; Take 1.5 tablets (75 mg total) by mouth 2 (two) times daily.  Dispense: 180 tablet; Refill: 3  2. Complex regional pain syndrome type 2 of both lower extremities Patient has been using fentanyl duragesic patches for pain control, refill requested.  - fentaNYL (DURAGESIC) 25 MCG/HR; Use one patch q 72 hrs for severe pain  Dispense: 10 patch; Refill: 0  3. Yeast infection of the skin Yeast infection in skin fold of groin, patient asked for lotrimin to be reordered.  - clotrimazole (LOTRIMIN) 1 % cream; APPLY EXTERNALLY TO THE AFFECTED AREA TWICE DAILY  Dispense: 60 g; Refill: 1  4. SOB (shortness of breath) EKG to rule out cardiac rhythm abnormalities. Results was anterolateral ST elevation repolarization variant, most likely normal.  - EKG 12-Lead  5. Impaired fasting glucose Impaired fasting glucose, check A1C to rule out diabetes. A1C 5.4 - POCT glycosylated hemoglobin (Hb A1C)    General Counseling: Quayshawn verbalizes understanding of the findings of todays visit and agrees with plan of treatment. I have discussed any further diagnostic evaluation that may be needed or ordered today. We also reviewed his  medications today. he has been encouraged to call the office with any questions or concerns that should arise related to todays visit.    Orders Placed This Encounter  Procedures   POCT glycosylated hemoglobin (Hb A1C)   EKG 12-Lead    Meds ordered this encounter  Medications   clotrimazole (LOTRIMIN) 1 % cream    Sig: APPLY EXTERNALLY TO THE AFFECTED AREA TWICE DAILY    Dispense:  60 g    Refill:  1   metoprolol tartrate (LOPRESSOR) 50 MG tablet    Sig: Take 1.5 tablets (75 mg total) by mouth 2 (two) times daily.    Dispense:  180 tablet    Refill:  3   fentaNYL (DURAGESIC) 25 MCG/HR    Sig: Use one patch q 72 hrs for severe pain    Dispense:  10 patch    Refill:  0    To picked up on Monday or Tuesday 5/30    Return in about 4 weeks (around 06/18/2021) for F/U, BP check, Jameelah Watts PCP.   Total time spent:30 Minutes Time spent includes review of chart, medications, test results, and follow up plan with the patient.   [place] Controlled Substance  Database was reviewed by me.  This patient was seen by Jonetta Osgood, FNP-C in collaboration with Dr. Clayborn Bigness as a part of collaborative care agreement.   Jeani Fassnacht R. Valetta Fuller, MSN, FNP-C Internal medicine

## 2021-05-24 ENCOUNTER — Other Ambulatory Visit: Payer: Self-pay | Admitting: Neurosurgery

## 2021-05-24 DIAGNOSIS — G8929 Other chronic pain: Secondary | ICD-10-CM

## 2021-05-25 ENCOUNTER — Encounter: Payer: Self-pay | Admitting: Internal Medicine

## 2021-05-27 ENCOUNTER — Encounter: Payer: Self-pay | Admitting: Nurse Practitioner

## 2021-05-30 ENCOUNTER — Encounter: Payer: Self-pay | Admitting: Internal Medicine

## 2021-05-30 ENCOUNTER — Encounter: Payer: Self-pay | Admitting: Nurse Practitioner

## 2021-05-31 ENCOUNTER — Encounter: Payer: Self-pay | Admitting: Nurse Practitioner

## 2021-05-31 ENCOUNTER — Telehealth: Payer: Self-pay

## 2021-05-31 NOTE — Telephone Encounter (Signed)
Pt called the after hours phone Sunday morning and left a vm advising his Bp was high at 154/114 and I messaged Alyssa and let her know what pt said.  Per Alyssa she didn't want to change any medications at this time bc to many changes and not enough time to see if things are going to work,  she advised if pt is really worried that he should go to the ER or urgent care.  Pt mentioned in the VM going back on the BP meds he was on before and that it would fix it but his Bp has been resistant ever since and she thinks it could be something else or other factors that is making his blood pressure more resistant  I tried to call pt back but no answer so I left a message advising that he should go to ER or urgent care if no better.

## 2021-06-02 ENCOUNTER — Telehealth: Payer: Self-pay

## 2021-06-02 NOTE — Telephone Encounter (Signed)
Lmom for patient to schedule an appointment due to high bp.  Jesus Crosby

## 2021-06-02 NOTE — Telephone Encounter (Signed)
Jesus Crosby from [facility] the overnight pulse oximetry order when she was in our office.

## 2021-06-03 DIAGNOSIS — E782 Mixed hyperlipidemia: Secondary | ICD-10-CM | POA: Diagnosis not present

## 2021-06-03 DIAGNOSIS — I10 Essential (primary) hypertension: Secondary | ICD-10-CM | POA: Diagnosis not present

## 2021-06-05 ENCOUNTER — Encounter: Payer: Self-pay | Admitting: Nurse Practitioner

## 2021-06-08 ENCOUNTER — Encounter: Payer: Self-pay | Admitting: Nurse Practitioner

## 2021-06-08 ENCOUNTER — Ambulatory Visit (INDEPENDENT_AMBULATORY_CARE_PROVIDER_SITE_OTHER): Payer: Medicare HMO | Admitting: Internal Medicine

## 2021-06-08 ENCOUNTER — Other Ambulatory Visit: Payer: Self-pay

## 2021-06-08 VITALS — BP 146/80 | HR 80 | Temp 97.6°F | Resp 16 | Ht 75.0 in | Wt 355.8 lb

## 2021-06-08 DIAGNOSIS — Z6841 Body Mass Index (BMI) 40.0 and over, adult: Secondary | ICD-10-CM

## 2021-06-08 DIAGNOSIS — I10 Essential (primary) hypertension: Secondary | ICD-10-CM | POA: Diagnosis not present

## 2021-06-08 DIAGNOSIS — G622 Polyneuropathy due to other toxic agents: Secondary | ICD-10-CM | POA: Diagnosis not present

## 2021-06-08 NOTE — Progress Notes (Signed)
[facility] [place], [place] [address]  Internal MEDICINE  Office Visit Note  Patient Name: Jesus Crosby  [id number]  [id number]  Date of Service: 06/15/2021  Chief Complaint  Patient presents with   Follow-up    Review meds, discuss BP, dizziness   Quality Metric Gaps    Shingrix, pneumovax done last year    HPI Patient is here for acute and sick visit, his blood pressure continues to be elevated and he is really concerned about it he would like to go back on amlodipine 10 mg, even though it causes him to have severe lower extremity edema however he feels better on this medication and would like to go back on it, his Lotrel was stopped, he does not like taking chlorthalidone but will like to continue to take his furosemide Patient does have some duplicate therapy is on Cymbalta and Zoloft.pt suffers from OCD  Followed by neurology for 5-year history of predominantly sensory (small fiber greater than large fiber) length dependent peripheral neuropathy. This has been attributed to B6 toxicity given the high dose (up to 500 mg) of B6 that he is taking for his homocystinemia/homocystinuria. When he cut the dose of his B6 his symptoms did not improve and his homocysteine levels climbed up (see below). I did not see a B6 level higher than 95 (reference range 5-50 mcg/liter). 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 I suspect that his neuropathy is more related to glucose intolerance/mild diabetes or metabolic syndrome rather than B6 toxicity. Other causes such as amyloid need to be excluded.  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 I do not see a genetic testing confirming this diagnosis. His phenotypic variant appears to be responsive to B6. He is additionally taking B12 and folate  supplementations along with betaine. It is not clear if he is on methionine restrictive (pure vegan) diet.  He continues to complain redness in his feet, Vascular studies have been normal  Current Medication: Outpatient Encounter Medications as of 06/08/2021  Medication Sig Note   ALPRAZolam (XANAX) 0.5 MG tablet Take 0.5 mg by mouth 4 (four) times daily. By dr reddy 03/25/2014: .    amLODipine (NORVASC) 5 MG tablet Take one tab po bid for blood pressure    aspirin EC 81 MG tablet Take 81 mg by mouth at bedtime.     Betaine (CYSTADANE) POWD Take 8 Scoops by mouth 3 (three) times daily.    clotrimazole (LOTRIMIN) 1 % cream APPLY EXTERNALLY TO THE AFFECTED AREA TWICE DAILY    dicyclomine (BENTYL) 10 MG capsule Take 1 capsule (10 mg total) by mouth 4 (four) times daily -  before meals and at bedtime. *Please call to make a follow up appointment for June    DULoxetine (CYMBALTA) 60 MG capsule Take 60 mg by mouth daily.    fentaNYL (DURAGESIC) 25 MCG/HR Use one patch q 72 hrs for severe pain    folic acid (FOLVITE) 371 MCG tablet Take 1,200 mcg by mouth at bedtime.     furosemide (LASIX) 20 MG tablet TAKE 1 TABLET(20 MG) BY MOUTH DAILY    gabapentin (NEURONTIN) 800 MG tablet Take 2 tablets by mouth 2 (two) times daily.     metoprolol tartrate (LOPRESSOR) 50 MG tablet Take 1.5 tablets (75 mg total) by mouth 2 (two) times daily.    NONFORMULARY  OR COMPOUNDED ITEM See pharmacy note    ondansetron (ZOFRAN) 8 MG tablet Take 1 tablet (8 mg total) by mouth 2 (two) times daily as needed for nausea or vomiting.    oxyCODONE (OXY IR/ROXICODONE) 5 MG immediate release tablet oxycodone 5 mg tablet  TAKE 1 TABLET BY MOUTH EVERY 6 HOURS AS NEEDED FOR PAIN    pantoprazole (PROTONIX) 40 MG tablet TAKE 1 TABLET(40 MG) BY MOUTH DAILY    pyridOXINE (VITAMIN B-6) 100 MG tablet Take 500 mg by mouth at bedtime.     sertraline (ZOLOFT) 100 MG tablet Take 200 mg by mouth daily.     vitamin B-12 (CYANOCOBALAMIN) 1000 MCG  tablet Take 1,000 mcg by mouth at bedtime.    [DISCONTINUED] amLODipine-benazepril (LOTREL) 5-10 MG capsule Take 1 capsule by mouth daily. In the morning.    [DISCONTINUED] atorvastatin (LIPITOR) 10 MG tablet Take 1 tablet (10 mg total) by mouth at bedtime.    [DISCONTINUED] chlorthalidone (HYGROTON) 25 MG tablet Take 0.5 tablets (12.5 mg total) by mouth daily. Take in the evening, if causing frequent urination, may switch to taking it in the morning.    Ferrous Sulfate Dried 45 MG TBCR Take 45 mg by mouth at bedtime.    losartan (COZAAR) 25 MG tablet Take 3 tabs every day    No facility-administered encounter medications on file as of 06/08/2021.    Surgical History: Past Surgical History:  Procedure Laterality Date   COLONOSCOPY  2020   COLONOSCOPY WITH PROPOFOL N/A 07/30/2015   Procedure: COLONOSCOPY WITH PROPOFOL;  Surgeon: [name], MD;  Location: [facility];  Service: Endoscopy;  Laterality: N/A;   DERMABRASION OF FACE     due to acne scars   distal radius fracture of right hand (plates, screws, and pins)  2011   ESOPHAGOGASTRODUODENOSCOPY (EGD) WITH PROPOFOL N/A 07/30/2015   Procedure: ESOPHAGOGASTRODUODENOSCOPY (EGD) WITH PROPOFOL;  Surgeon: [name], MD;  Location: [facility];  Service: Endoscopy;  Laterality: N/A;   EYE SURGERY     LASER + SURG BIL    GAS/FLUID EXCHANGE Right 09/17/2013   Procedure: GAS/FLUID EXCHANGE;  Surgeon: Hayden Pedro, MD;  Location: [place];  Service: Ophthalmology;  Laterality: Right;   gastric sleeve surgery  08/17/2016   HIP ARTHROPLASTY Left    INTRAOCULAR LENS INSERTION Bilateral    lens disease due to [place] Left    THR   NASAL SEPTUM SURGERY  march 2016   @ [facility] VITRECTOMY Right 09/17/2013   Procedure: PARS PLANA VITRECTOMY WITH 25G REMOVAL/SUTURE SECONDARY INTRAOCULAR LENS;  Surgeon: Hayden Pedro, MD;  Location: [place];  Service: Ophthalmology;  Laterality: Right;   PHOTOCOAGULATION  Right 09/17/2013   Procedure: PHOTOCOAGULATION;  Surgeon: Hayden Pedro, MD;  Location: [place];  Service: Ophthalmology;  Laterality: Right;  HEADSCOPE LASER   PULSE GENERATOR IMPLANT N/A 02/13/2019   Procedure: UNILATERAL PULSE GENERATOR IMPLANT;  Surgeon: Meade Maw, MD;  Location: [facility];  Service: Neurosurgery;  Laterality: N/A;   scapholunate ligament reconstruction of right hand  4034   UMBILICAL HERNIA REPAIR N/A 05/07/2015   Procedure: HERNIA REPAIR UMBILICAL ADULT;  Surgeon: Florene Glen, MD;  Location: [facility];  Service: General;  Laterality: N/A;   UPPER GASTROINTESTINAL ENDOSCOPY  2012   WRIST FUSION Left    WRIST SURGERY Right     Medical History: Past Medical History:  Diagnosis Date   Anxiety    Arthritis  knees,Right wrist   Avascular necrosis (HCC)    Brain bleed (HCC)    Cataract    bilateral repair with lens implants   Depression    Dysplastic nevus 03/22/2018    left mid to upper back paraspinal 2.5cm lat to spine -  DYSPLASTIC COMPOUND NEVUS WITH MILD ATYPIA, LIMITED MARGINS FREE   Family history of adverse reaction to anesthesia    n/v-mom   GERD (gastroesophageal reflux disease)    Homocystinuria (HCC)    Hypertension    Idiopathic progressive polyneuropathy    Lens disease    Neuromuscular disorder (HCC)    Obesity, Class II, BMI 35-39.9    OCD (obsessive compulsive disorder)    Oxygen deficiency    2 liters   Paresthesia 06/10/2015   Plantar fasciitis    Sleep apnea    cpap   Stroke ([place])    with brain bleed at age 46-TK deficits   Umbilical hernia     Family History: Family History  Problem Relation Age of Onset   Breast cancer Mother    Hypertension Mother    Colon polyps Mother    Hypertension Father    Colon polyps Father    Stroke Maternal Grandmother    Stroke Maternal Grandfather    Stroke Paternal Grandmother    Stroke Paternal Grandfather    Stroke Other    Colon cancer Neg Hx    Esophageal cancer Neg Hx     Rectal cancer Neg Hx    Stomach cancer Neg Hx     Social History   Socioeconomic History   Marital status: Single    Spouse name: n/a   Number of children: 0   Years of education: college   Highest education level: Not on file  Occupational History   Occupation: unemployed/disabled    Comment: Lexicographer  Tobacco Use   Smoking status: Never   Smokeless tobacco: Never  Vaping Use   Vaping Use: Never used  Substance and Sexual Activity   Alcohol use: No    Alcohol/week: 0.0 standard drinks   Drug use: No   Sexual activity: Not on file  Other Topics Concern   Not on file  Social History Narrative   Lives alone. Parents live nearby.  Applying for disability (OCD) due to wrist injury, hearing upcoming.      Patient drinks 4-5 cups of caffeine daily.   Patient is right handed.   Social Determinants of Health   Financial Resource Strain: Low Risk    Difficulty of Paying Living Expenses: Not very hard  Food Insecurity: Not on file  Transportation Needs: Not on file  Physical Activity: Not on file  Stress: Not on file  Social Connections: Not on file  Intimate Partner Violence: Not on file      Review of Systems  Constitutional:  Negative for chills, fatigue and unexpected weight change.  HENT:  Negative for congestion, rhinorrhea, sneezing and sore throat.   Eyes:  Negative for redness.  Respiratory:  Negative for cough, chest tightness and shortness of breath.   Cardiovascular:  Positive for leg swelling. Negative for chest pain and palpitations.  Gastrointestinal:  Negative for abdominal pain, constipation, diarrhea, nausea and vomiting.  Genitourinary:  Negative for dysuria and frequency.  Musculoskeletal:  Negative for arthralgias, back pain, joint swelling and neck pain.  Skin:  Negative for rash.  Neurological: Negative.  Negative for tremors and numbness.  Hematological:  Negative for adenopathy. Does not bruise/bleed easily.   Psychiatric/Behavioral:  Negative for behavioral problems (Depression), sleep disturbance and suicidal ideas. The patient is not nervous/anxious.    Vital Signs: BP (!) 146/80   Pulse 80   Temp 97.6 F (36.4 C)   Resp 16   Ht 6\' 3"  (1.905 m)   Wt (!) 355 lb 12.8 oz (161.4 kg)   SpO2 96%   BMI 44.47 kg/m    Physical Exam Constitutional:      Appearance: Normal appearance.  HENT:     Head: Normocephalic and atraumatic.     Nose: Nose normal.     Mouth/Throat:     Mouth: Mucous membranes are moist.     Pharynx: No posterior oropharyngeal erythema.  Eyes:     Extraocular Movements: Extraocular movements intact.     Pupils: Pupils are equal, round, and reactive to light.  Cardiovascular:     Pulses: Normal pulses.     Heart sounds: Normal heart sounds.  Pulmonary:     Effort: Pulmonary effort is normal.     Breath sounds: Normal breath sounds.  Skin:    Findings: Erythema present.  Neurological:     General: No focal deficit present.     Mental Status: He is alert.  Psychiatric:        Mood and Affect: Mood normal.        Behavior: Behavior normal.     Comments: Anxious        Assessment/Plan: 1. Uncontrolled hypertension Patient continues to have uncontrolled hypertension, he needs to go back on all of his medication including metoprolol 75 mg twice a day , Norvasc 5 mg twice a day. DC chlorthalidone, continue Lasix as before, if needed we will add hydralazine 10 twice daily. Patient was explained to the patient - amLODipine (NORVASC) 5 MG tablet; Take one tab po bid for blood pressure  Dispense: 180 tablet; Refill: 2 - losartan (COZAAR) 25 MG tablet; Take 3 tabs every day  Dispense: 90 tablet; Refill: 1  2. Morbid obesity with BMI of 45.0-49.9, adult [facility]) Patient continues suffer from obesity, he will be a good candidate for weight loss management  3. Toxic neuropathy ([place]) Pt has follow up neurology   General Counseling: mackson botz understanding of  the findings of todays visit and agrees with plan of treatment. I have discussed any further diagnostic evaluation that may be needed or ordered today. We also reviewed his medications today. he has been encouraged to call the office with any questions or concerns that should arise related to todays visit.    No orders of the defined types were placed in this encounter.   Meds ordered this encounter  Medications   amLODipine (NORVASC) 5 MG tablet    Sig: Take one tab po bid for blood pressure    Dispense:  180 tablet    Refill:  2   losartan (COZAAR) 25 MG tablet    Sig: Take 3 tabs every day    Dispense:  90 tablet    Refill:  1    Total time spent:30 Minutes Time spent includes review of chart, medications, test results, and follow up plan with the patient.   [place] Controlled Substance Database was reviewed by me.   Dr [name] Internal medicine

## 2021-06-09 ENCOUNTER — Other Ambulatory Visit: Payer: Self-pay

## 2021-06-09 DIAGNOSIS — G4733 Obstructive sleep apnea (adult) (pediatric): Secondary | ICD-10-CM

## 2021-06-09 DIAGNOSIS — E7849 Other hyperlipidemia: Secondary | ICD-10-CM

## 2021-06-09 MED ORDER — ATORVASTATIN CALCIUM 10 MG PO TABS
10.0000 mg | ORAL_TABLET | Freq: Every day | ORAL | 1 refills | Status: AC
Start: 2021-06-09 — End: ?

## 2021-06-09 MED ORDER — AMLODIPINE BESYLATE 5 MG PO TABS
ORAL_TABLET | ORAL | 2 refills | Status: AC
Start: 2021-06-09 — End: ?

## 2021-06-11 ENCOUNTER — Encounter: Payer: Self-pay | Admitting: Internal Medicine

## 2021-06-15 ENCOUNTER — Telehealth: Payer: Self-pay

## 2021-06-15 ENCOUNTER — Ambulatory Visit: Payer: Medicare HMO | Admitting: Nurse Practitioner

## 2021-06-15 MED ORDER — LOSARTAN POTASSIUM 25 MG PO TABS: ORAL_TABLET | ORAL | 1 refills | Status: DC

## 2021-06-15 NOTE — Telephone Encounter (Signed)
Left vm to screen for 06/16/21 appointment-Toni 

## 2021-06-15 NOTE — Telephone Encounter (Signed)
Called LM[facility] to check on pt to see how is blood pressure is doing per [facility]

## 2021-06-16 ENCOUNTER — Ambulatory Visit: Payer: Medicare HMO

## 2021-06-16 ENCOUNTER — Other Ambulatory Visit: Payer: Self-pay | Admitting: Nurse Practitioner

## 2021-06-16 ENCOUNTER — Other Ambulatory Visit: Payer: Self-pay

## 2021-06-16 DIAGNOSIS — Z8673 Personal history of transient ischemic attack (TIA), and cerebral infarction without residual deficits: Secondary | ICD-10-CM | POA: Diagnosis not present

## 2021-06-16 DIAGNOSIS — I6529 Occlusion and stenosis of unspecified carotid artery: Secondary | ICD-10-CM

## 2021-06-16 DIAGNOSIS — G5771 Causalgia of right lower limb: Secondary | ICD-10-CM

## 2021-06-16 DIAGNOSIS — I10 Essential (primary) hypertension: Secondary | ICD-10-CM

## 2021-06-16 DIAGNOSIS — G5772 Causalgia of left lower limb: Secondary | ICD-10-CM

## 2021-06-17 ENCOUNTER — Encounter: Payer: Self-pay | Admitting: Nurse Practitioner

## 2021-06-17 MED ORDER — FENTANYL 25 MCG/HR TD PT72: MEDICATED_PATCH | TRANSDERMAL | 0 refills | Status: DC

## 2021-06-18 ENCOUNTER — Encounter: Payer: Self-pay | Admitting: Nurse Practitioner

## 2021-06-18 ENCOUNTER — Encounter: Payer: Self-pay | Admitting: Internal Medicine

## 2021-06-18 DIAGNOSIS — E7211 Homocystinuria: Secondary | ICD-10-CM | POA: Diagnosis not present

## 2021-06-18 DIAGNOSIS — G4733 Obstructive sleep apnea (adult) (pediatric): Secondary | ICD-10-CM | POA: Diagnosis not present

## 2021-06-19 DIAGNOSIS — G4731 Primary central sleep apnea: Secondary | ICD-10-CM | POA: Diagnosis not present

## 2021-06-19 DIAGNOSIS — G4733 Obstructive sleep apnea (adult) (pediatric): Secondary | ICD-10-CM | POA: Diagnosis not present

## 2021-06-21 ENCOUNTER — Encounter: Payer: Self-pay | Admitting: Internal Medicine

## 2021-06-21 NOTE — Telephone Encounter (Signed)
Overnight results

## 2021-06-22 ENCOUNTER — Telehealth: Payer: Self-pay

## 2021-06-22 DIAGNOSIS — E7211 Homocystinuria: Secondary | ICD-10-CM | POA: Diagnosis not present

## 2021-06-22 NOTE — Telephone Encounter (Signed)
Called LMOM that his overnight pulse oximetry test was negative and if any questions he can gives [place] a call back.

## 2021-06-23 ENCOUNTER — Other Ambulatory Visit: Payer: Medicare HMO

## 2021-06-23 ENCOUNTER — Telehealth: Payer: Self-pay

## 2021-06-24 ENCOUNTER — Other Ambulatory Visit: Payer: Self-pay | Admitting: Internal Medicine

## 2021-06-24 ENCOUNTER — Encounter: Payer: Self-pay | Admitting: Nurse Practitioner

## 2021-06-24 DIAGNOSIS — G894 Chronic pain syndrome: Secondary | ICD-10-CM

## 2021-06-24 MED ORDER — ONDANSETRON HCL 8 MG PO TABS: 8.0000 mg | ORAL_TABLET | Freq: Two times a day (BID) | ORAL | 3 refills | Status: AC | PRN

## 2021-06-25 NOTE — Telephone Encounter (Signed)
error 

## 2021-06-26 ENCOUNTER — Encounter: Payer: Self-pay | Admitting: Internal Medicine

## 2021-06-29 ENCOUNTER — Other Ambulatory Visit: Payer: Self-pay

## 2021-06-29 ENCOUNTER — Encounter: Payer: Self-pay | Admitting: Nurse Practitioner

## 2021-06-29 ENCOUNTER — Ambulatory Visit (INDEPENDENT_AMBULATORY_CARE_PROVIDER_SITE_OTHER): Payer: Medicare HMO | Admitting: Nurse Practitioner

## 2021-06-29 VITALS — BP 124/72 | HR 88 | Temp 98.4°F | Resp 16 | Ht 75.0 in | Wt 344.6 lb

## 2021-06-29 DIAGNOSIS — I10 Essential (primary) hypertension: Secondary | ICD-10-CM | POA: Diagnosis not present

## 2021-06-29 DIAGNOSIS — Z6841 Body Mass Index (BMI) 40.0 and over, adult: Secondary | ICD-10-CM

## 2021-06-29 DIAGNOSIS — G622 Polyneuropathy due to other toxic agents: Secondary | ICD-10-CM

## 2021-06-29 DIAGNOSIS — E7211 Homocystinuria: Secondary | ICD-10-CM | POA: Diagnosis not present

## 2021-06-29 NOTE — Progress Notes (Signed)
[facility] [place], [place] [address]  Internal MEDICINE  Office Visit Note  Patient Name: Jesus Crosby  [id number]  [id number]  Date of Service: 06/29/2021  Chief Complaint  Patient presents with   Follow-up    BP check, refill request, [place] results   Depression   Gastroesophageal Reflux   Sleep Apnea   Hypertension    BP has been doing better    Anxiety    HPI [name] presents for a follow up visit for blood pressure check, refill request and to review carotid ultrasound results. Blood pressure is well controlled today, 124/72.  -carotid ultrasound results discussed with the patient, results show less than 50% stenosis in the carotid arteries bilaterally.  - Anuj is asking for specific labs to be done per Dr. Liana Crocker to evaluate for amyloidosis and similar conditions, those tests are immunifixation electrophoresis and light chain ratios. Pratyush is asking for these labs to be ordered.   Current Medication: Outpatient Encounter Medications as of 06/29/2021  Medication Sig Note   ALPRAZolam (XANAX) 0.5 MG tablet Take 0.5 mg by mouth 4 (four) times daily. By dr reddy 03/25/2014: .    amLODipine (NORVASC) 5 MG tablet Take one tab po bid for blood pressure    aspirin EC 81 MG tablet Take 81 mg by mouth at bedtime.     atorvastatin (LIPITOR) 10 MG tablet Take 1 tablet (10 mg total) by mouth at bedtime.    Betaine (CYSTADANE) POWD Take 8 Scoops by mouth 3 (three) times daily.    clotrimazole (LOTRIMIN) 1 % cream APPLY EXTERNALLY TO THE AFFECTED AREA TWICE DAILY    dicyclomine (BENTYL) 10 MG capsule Take 1 capsule (10 mg total) by mouth 4 (four) times daily -  before meals and at bedtime. *Please call to make a follow up appointment for June    DULoxetine (CYMBALTA) 60 MG capsule Take 60 mg by mouth daily.    Ferrous Sulfate Dried 45 MG TBCR Take 45 mg by mouth at bedtime.    folic acid (FOLVITE) A999333 MCG tablet Take 1,200 mcg by mouth at bedtime.     furosemide  (LASIX) 20 MG tablet TAKE 1 TABLET(20 MG) BY MOUTH DAILY    gabapentin (NEURONTIN) 800 MG tablet Take 2 tablets by mouth 2 (two) times daily.     losartan (COZAAR) 25 MG tablet Take 3 tabs every day    metoprolol tartrate (LOPRESSOR) 50 MG tablet Take 1.5 tablets (75 mg total) by mouth 2 (two) times daily.    NONFORMULARY OR COMPOUNDED ITEM See pharmacy note    ondansetron (ZOFRAN) 8 MG tablet Take 1 tablet (8 mg total) by mouth 2 (two) times daily as needed for nausea or vomiting.    oxyCODONE (OXY IR/ROXICODONE) 5 MG immediate release tablet oxycodone 5 mg tablet  TAKE 1 TABLET BY MOUTH EVERY 6 HOURS AS NEEDED FOR PAIN    pantoprazole (PROTONIX) 40 MG tablet TAKE 1 TABLET(40 MG) BY MOUTH DAILY    pyridOXINE (VITAMIN B-6) 100 MG tablet Take 500 mg by mouth at bedtime.     sertraline (ZOLOFT) 100 MG tablet Take 200 mg by mouth daily.     vitamin B-12 (CYANOCOBALAMIN) 1000 MCG tablet Take 1,000 mcg by mouth at bedtime.    [DISCONTINUED] fentaNYL (DURAGESIC) 25 MCG/HR Use one patch q 72 hrs for severe pain    No facility-administered encounter medications on file as of 06/29/2021.    Surgical History: Past Surgical History:  Procedure  Laterality Date   COLONOSCOPY  2020   COLONOSCOPY WITH PROPOFOL N/A 07/30/2015   Procedure: COLONOSCOPY WITH PROPOFOL;  Surgeon: [name], MD;  Location: [facility];  Service: Endoscopy;  Laterality: N/A;   DERMABRASION OF FACE     due to acne scars   distal radius fracture of right hand (plates, screws, and pins)  2011   ESOPHAGOGASTRODUODENOSCOPY (EGD) WITH PROPOFOL N/A 07/30/2015   Procedure: ESOPHAGOGASTRODUODENOSCOPY (EGD) WITH PROPOFOL;  Surgeon: [name], MD;  Location: [facility];  Service: Endoscopy;  Laterality: N/A;   EYE SURGERY     LASER + SURG BIL    GAS/FLUID EXCHANGE Right 09/17/2013   Procedure: GAS/FLUID EXCHANGE;  Surgeon: Hayden Pedro, MD;  Location: [facility];  Service: Ophthalmology;  Laterality: Right;   gastric sleeve  surgery  08/17/2016   HIP ARTHROPLASTY Left    INTRAOCULAR LENS INSERTION Bilateral    lens disease due to [place] Left    THR   NASAL SEPTUM SURGERY  march 2016   @ [place] VITRECTOMY Right 09/17/2013   Procedure: PARS PLANA VITRECTOMY WITH 25G REMOVAL/SUTURE SECONDARY INTRAOCULAR LENS;  Surgeon: Hayden Pedro, MD;  Location: [facility];  Service: Ophthalmology;  Laterality: Right;   PHOTOCOAGULATION Right 09/17/2013   Procedure: PHOTOCOAGULATION;  Surgeon: Hayden Pedro, MD;  Location: [facility];  Service: Ophthalmology;  Laterality: Right;  HEADSCOPE LASER   PULSE GENERATOR IMPLANT N/A 02/13/2019   Procedure: UNILATERAL PULSE GENERATOR IMPLANT;  Surgeon: Meade Maw, MD;  Location: [facility];  Service: Neurosurgery;  Laterality: N/A;   scapholunate ligament reconstruction of right hand  [id number]   UMBILICAL HERNIA REPAIR N/A 05/07/2015   Procedure: HERNIA REPAIR UMBILICAL ADULT;  Surgeon: Florene Glen, MD;  Location: [facility];  Service: General;  Laterality: N/A;   UPPER GASTROINTESTINAL ENDOSCOPY  2012   WRIST FUSION Left    WRIST SURGERY Right     Medical History: Past Medical History:  Diagnosis Date   Anxiety    Arthritis    knees,Right wrist   Avascular necrosis ([place])    Brain bleed ([place])    Cataract    bilateral repair with lens implants   Depression    Dysplastic nevus 03/22/2018    left mid to upper back paraspinal 2.5cm lat to spine -  DYSPLASTIC COMPOUND NEVUS WITH MILD ATYPIA, LIMITED MARGINS FREE   Family history of adverse reaction to anesthesia    n/v-mom   GERD (gastroesophageal reflux disease)    Homocystinuria (H[facility])    Hypertension    Idiopathic progressive polyneuropathy    Lens disease    Neuromuscular disorder (HCC)    Obesity, Class II, BMI 35-39.9    OCD (obsessive compulsive disorder)    Oxygen deficiency    2 liters   Paresthesia 06/10/2015   Plantar fasciitis    Sleep apnea    cpap   Stroke [facility])    with  brain bleed at age [id number] deficits   Umbilical hernia     Family History: Family History  Problem Relation Age of Onset   Breast cancer Mother    Hypertension Mother    Colon polyps Mother    Hypertension Father    Colon polyps Father    Stroke Maternal Grandmother    Stroke Maternal Grandfather    Stroke Paternal Grandmother    Stroke Paternal Grandfather    Stroke Other    Colon cancer Neg Hx    Esophageal  cancer Neg Hx    Rectal cancer Neg Hx    Stomach cancer Neg Hx     Social History   Socioeconomic History   Marital status: Single    Spouse name: n/a   Number of children: 0   Years of education: college   Highest education level: Not on file  Occupational History   Occupation: unemployed/disabled    Comment: Lexicographer  Tobacco Use   Smoking status: Never   Smokeless tobacco: Never  Vaping Use   Vaping Use: Never used  Substance and Sexual Activity   Alcohol use: No    Alcohol/week: 0.0 standard drinks   Drug use: No   Sexual activity: Not on file  Other Topics Concern   Not on file  Social History Narrative   Lives alone. Parents live nearby.  Applying for disability (OCD) due to wrist injury, hearing upcoming.      Patient drinks 4-5 cups of caffeine daily.   Patient is right handed.   Social Determinants of Health   Financial Resource Strain: Low Risk    Difficulty of Paying Living Expenses: Not very hard  Food Insecurity: Not on file  Transportation Needs: Not on file  Physical Activity: Not on file  Stress: Not on file  Social Connections: Not on file  Intimate Partner Violence: Not on file      Review of Systems  Constitutional:  Negative for chills, fatigue and unexpected weight change.  HENT:  Negative for congestion, rhinorrhea, sneezing and sore throat.   Eyes:  Negative for redness.  Respiratory:  Negative for cough, chest tightness and shortness of breath.   Cardiovascular:  Negative for chest pain and palpitations.   Gastrointestinal:  Negative for abdominal pain, constipation, diarrhea, nausea and vomiting.  Genitourinary:  Negative for dysuria and frequency.  Musculoskeletal:  Negative for arthralgias, back pain, joint swelling and neck pain.  Skin:  Negative for rash.  Neurological:  Positive for numbness (peripheral neuropathy). Negative for tremors.  Hematological:  Negative for adenopathy. Does not bruise/bleed easily.  Psychiatric/Behavioral:  Negative for behavioral problems (Depression), self-injury, sleep disturbance and suicidal ideas. The patient is nervous/anxious.    Vital Signs: BP 124/72   Pulse 88   Temp 98.4 F (36.9 C)   Resp 16   Ht '6\' 3"'$  (1.905 m)   Wt (!) 344 lb 9.6 oz (156.3 kg)   SpO2 98%   BMI 43.07 kg/m    Physical Exam Vitals reviewed.  Constitutional:      General: He is not in acute distress.    Appearance: Normal appearance. He is obese. He is not ill-appearing.  Cardiovascular:     Rate and Rhythm: Normal rate and regular rhythm.     Pulses: Normal pulses.  Pulmonary:     Effort: Pulmonary effort is normal. No respiratory distress.  Skin:    General: Skin is warm and dry.     Capillary Refill: Capillary refill takes less than 2 seconds.  Neurological:     Mental Status: He is alert and oriented to person, place, and time.  Psychiatric:        Attention and Perception: Attention and perception normal.        Mood and Affect: Affect normal. Mood is anxious. Mood is not depressed. Affect is not labile or tearful.        Speech: Speech normal. Speech is not rapid and pressured or tangential.        Behavior: Behavior normal. Behavior is not  agitated or hyperactive. Behavior is cooperative.        Thought Content: Thought content normal. Thought content is not paranoid or delusional. Thought content does not include homicidal or suicidal ideation.        Cognition and Memory: Cognition and memory normal.        Judgment: Judgment normal. Judgment is not  impulsive.    Assessment/Plan: 1. Primary hypertension Better controlled by current medications which were changed at his last office visit.   2. Toxic neuropathy ([place]) Patient is requesting these labs to be drawn to determine other possible causes of neuropathy including amyloidosis. Dean states these lab tests were recommended to be done by his geneticist, Dr. Liana Crocker.  - PE and FLC, Serum - Immunofixation (IFE), Serum  3. Morbid obesity with BMI of 40.0-44.9, adult [facility]) Patient is aware that weight loss would be beneficial for multiple reasons but has difficulty walking due to neuropathy. He is on a restricted diet due to his homocystinuria/homocystinemia.     General Counseling: baylor kritzer understanding of the findings of todays visit and agrees with plan of treatment. I have discussed any further diagnostic evaluation that may be needed or ordered today. We also reviewed his medications today. he has been encouraged to call the office with any questions or concerns that should arise related to todays visit.    Orders Placed This Encounter  Procedures   PE and FLC, Serum   Immunofixation (IFE), Serum    No orders of the defined types were placed in this encounter.   Return in about 3 months (around 09/29/2021) for F/U, BP check, Melchor Kirchgessner PCP.   Total time spent:20 Minutes Time spent includes review of chart, medications, test results, and follow up plan with the patient.   [place] Controlled Substance Database was reviewed by me.  This patient was seen by Jonetta Osgood, FNP-C in collaboration with Dr. Clayborn Bigness as a part of collaborative care agreement.   Raylon Lamson R. Valetta Fuller, MSN, FNP-C Internal medicine

## 2021-07-02 ENCOUNTER — Ambulatory Visit (INDEPENDENT_AMBULATORY_CARE_PROVIDER_SITE_OTHER): Payer: Medicare HMO | Admitting: Nurse Practitioner

## 2021-07-02 ENCOUNTER — Encounter: Payer: Self-pay | Admitting: Nurse Practitioner

## 2021-07-02 ENCOUNTER — Other Ambulatory Visit: Payer: Self-pay

## 2021-07-02 VITALS — BP 164/92 | HR 100 | Temp 97.6°F | Resp 16 | Ht 75.0 in | Wt 338.8 lb

## 2021-07-02 DIAGNOSIS — Z8673 Personal history of transient ischemic attack (TIA), and cerebral infarction without residual deficits: Secondary | ICD-10-CM | POA: Insufficient documentation

## 2021-07-02 DIAGNOSIS — F22 Delusional disorders: Secondary | ICD-10-CM | POA: Diagnosis not present

## 2021-07-02 DIAGNOSIS — R4586 Emotional lability: Secondary | ICD-10-CM

## 2021-07-02 DIAGNOSIS — Z23 Encounter for immunization: Secondary | ICD-10-CM | POA: Diagnosis not present

## 2021-07-02 DIAGNOSIS — I10 Essential (primary) hypertension: Secondary | ICD-10-CM

## 2021-07-02 DIAGNOSIS — G5771 Causalgia of right lower limb: Secondary | ICD-10-CM | POA: Diagnosis not present

## 2021-07-02 DIAGNOSIS — G5772 Causalgia of left lower limb: Secondary | ICD-10-CM

## 2021-07-02 MED ORDER — FENTANYL 12 MCG/HR TD PT72
1.0000 | MEDICATED_PATCH | TRANSDERMAL | 0 refills | Status: AC
Start: 2021-07-02 — End: ?

## 2021-07-02 MED ORDER — PNEUMOCOCCAL 20-VAL CONJ VACC 0.5 ML IM SUSY: 0.5000 mL | PREFILLED_SYRINGE | INTRAMUSCULAR | 0 refills | Status: AC

## 2021-07-02 MED ORDER — TETANUS-DIPHTH-ACELL PERTUSSIS 5-2.5-18.5 LF-MCG/0.5 IM SUSP: 0.5000 mL | Freq: Once | INTRAMUSCULAR | 0 refills | Status: AC

## 2021-07-02 MED ORDER — ZOSTER VAC RECOMB ADJUVANTED 50 MCG/0.5ML IM SUSR: 0.5000 mL | Freq: Once | INTRAMUSCULAR | 0 refills | Status: AC

## 2021-07-02 NOTE — Procedures (Signed)
[place], [place] [address]  DATE OF SERVICE: 06/16/2021  CAROTID DOPPLER INTERPRETATION:  Bilateral Carotid Ultrsasound and Color Doppler Examination was performed. The RIGHT CCA shows mild plaque in the vessel. The LEFT CCA shows mild plaque in the vessel. There was no intimal thickening noted in the RIGHT carotid artery. There was no intimal thickening in the LEFT carotid artery.  The RIGHT CCA shows peak systolic velocity of 88 cm per second. The end diastolic velocity is 20 cm per second on the RIGHT side. The RIGHT ICA shows peak systolic velocity of 93 per second. RIGHT sided ICA end diastolic velocity is 37 cm per second. The RIGHT ECA shows a peak systolic velocity of A999333 cm per second. The ICA/CCA ratio is calculated to be 1.06. This suggests less than 50% stenosis. The Vertebral Artery shows antegrade flow.  The LEFT CCA shows peak systolic velocity of 70 cm per second. The end diastolic velocity is 22 cm per second on the LEFT side. The LEFT ICA shows peak systolic velocity of 84 per second. LEFT sided ICA end diastolic velocity is 19 cm per second. The LEFT ECA shows a peak systolic velocity of 95 cm per second. The ICA/CCA ratio is calculated to be 1.2. This suggests less than 50% stenosis. The Vertebral Artery shows antegrade flow.   Impression:    The RIGHT CAROTID shows less than 50% stenosis. The LEFT CAROTID shows less than 50% stenosis.  There is mild plaque formation noted on the LEFT and mild plaque on the RIGHT  side. Consider a repeat Carotid doppler if clinical situation and symptoms warrant in 6-12 months. Patient should be encouraged to change lifestyles such as smoking cessation, regular exercise and dietary modification. Use of statins in the right clinical setting and ASA is encouraged.  [name], MD [facility] Pulmonary Critical Care Medicine
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
## 2021-07-04 ENCOUNTER — Telehealth: Payer: Self-pay

## 2021-07-04 MED ORDER — LOSARTAN POTASSIUM 25 MG PO TABS: ORAL_TABLET | ORAL | 1 refills | Status: AC

## 2021-07-04 NOTE — Telephone Encounter (Signed)
On 07/03/21 the pt's father called the on call phone and I called his father back.  Father was asking about having pt commited for an evaluation and how it could be done due to the pt's mental situation that he was seen on Friday 07/02/21 in the office with Alyssa.  I advised the father that he could call the police dept, EMS or take the pt to hospital himself to have an evaluation done to have pt committed.  At the time the father felt that pt seemed to be ok at the moment and would go back to his house and check up on him later and I advised pt's father to call us back if he felt things needed to go further.  On Sunday  the [employer] called the on-call phone and was asking for Dr Humphrey Rolls to return his call about patient.  I called Srgt. Edwards back to see what he needed and he advised that the pt had passed away and was needing to speak with [facility]FK.  He advised that they are not sure if pt had overdosed on the pain patches.  I informed the officer that the pt had been seen in our office on 07/02/21 by Alyssa for mental changes and I advised him that he could call and speak to DFK.  I spoke to [facility] and advised what had happened and she had me tell the officer to call her.  I gave the officer DFK's number and he called her.

## 2021-07-05 NOTE — Progress Notes (Signed)
Duplicate note

## 2021-07-05 DEATH — deceased

## 2021-07-06 LAB — IMMUNOFIXATION, SERUM
IgA/Immunoglobulin A, Serum: 534 mg/dL — ABNORMAL HIGH (ref 90–386)
IgG (Immunoglobin G), Serum: 1689 mg/dL — ABNORMAL HIGH (ref 603–1613)
IgM (Immunoglobulin M), Srm: 144 mg/dL (ref 20–172)

## 2021-07-06 LAB — PE AND FLC, SERUM
A/G Ratio: 1 (ref 0.7–1.7)
Albumin ELP: 3.9 g/dL (ref 2.9–4.4)
Alpha 1: 0.3 g/dL (ref 0.0–0.4)
Alpha 2: 1 g/dL (ref 0.4–1.0)
Beta: 1.1 g/dL (ref 0.7–1.3)
Gamma Globulin: 1.7 g/dL (ref 0.4–1.8)
Globulin, Total: 4 g/dL — ABNORMAL HIGH (ref 2.2–3.9)
Ig Kappa Free Light Chain: 34.2 mg/L — ABNORMAL HIGH (ref 3.3–19.4)
Ig Lambda Free Light Chain: 23.2 mg/L (ref 5.7–26.3)
KAPPA/LAMBDA RATIO: 1.47 (ref 0.26–1.65)
Total Protein: 7.9 g/dL (ref 6.0–8.5)

## 2021-07-08 ENCOUNTER — Ambulatory Visit: Payer: Medicare HMO | Admitting: Nurse Practitioner

## 2021-07-20 DIAGNOSIS — G4731 Primary central sleep apnea: Secondary | ICD-10-CM | POA: Diagnosis not present

## 2021-07-20 DIAGNOSIS — G4733 Obstructive sleep apnea (adult) (pediatric): Secondary | ICD-10-CM | POA: Diagnosis not present

## 2021-07-21 ENCOUNTER — Telehealth: Payer: Self-pay | Admitting: Pharmacist

## 2021-07-21 NOTE — Progress Notes (Addendum)
Chronic Care Management Pharmacy Assistant   Name: Jesus Crosby.  MRN: [id number] DOB: March 31, 1966  Reason for Encounter: General Disease State Call    Conditions to be addressed/monitored: HTN, DDD/Arthritis, HLD,Anxiety/Depression,Pre-Diabetes, OCD  Recent office visits:  07/02/21 Jonetta Osgood, NP. For acute visit. STARTED Fentanyl 12 MCG/HR 1 patch every 72 hours. MODIFIED Losartan 25 mg 1 tablet 4 times daily. Per note: Tetanus vaccine was given.  06/29/21 Jonetta Osgood, NP. For follow-up. No medication changes.  06/08/21 Dr. Humphrey Rolls For uncontrolled hypertension. STARTED Amlodipine Besylate 5 mg 1 tablet PO BID and Losartan 25 mg 1 tablet 3 times daily. STOPPED Amlodipine Besy-Benazepril 5-10 mg and Chlorthalidone.   Recent consult visits:  06/22/21 (Telemedicine) Pediatric Barbie Haggis, Chinita Greenland, MD No medication changes.   Hospital visits:  None since 05/21/21  Medications: Outpatient Encounter Medications as of 07/21/2021  Medication Sig Note   ALPRAZolam (XANAX) 0.5 MG tablet Take 0.5 mg by mouth 4 (four) times daily. By dr reddy 03/25/2014: .    amLODipine (NORVASC) 5 MG tablet Take one tab po bid for blood pressure    aspirin EC 81 MG tablet Take 81 mg by mouth at bedtime.     atorvastatin (LIPITOR) 10 MG tablet Take 1 tablet (10 mg total) by mouth at bedtime.    Betaine (CYSTADANE) POWD Take 8 Scoops by mouth 3 (three) times daily.    clotrimazole (LOTRIMIN) 1 % cream APPLY EXTERNALLY TO THE AFFECTED AREA TWICE DAILY    dicyclomine (BENTYL) 10 MG capsule Take 1 capsule (10 mg total) by mouth 4 (four) times daily -  before meals and at bedtime. *Please call to make a follow up appointment for June    DULoxetine (CYMBALTA) 60 MG capsule Take 60 mg by mouth daily.    fentaNYL (DURAGESIC) 12 MCG/HR Place 1 patch onto the skin every 3 (three) days.    Ferrous Sulfate Dried 45 MG TBCR Take 45 mg by mouth at bedtime.    folic acid (FOLVITE) A999333 MCG tablet Take  1,200 mcg by mouth at bedtime.     furosemide (LASIX) 20 MG tablet TAKE 1 TABLET(20 MG) BY MOUTH DAILY    gabapentin (NEURONTIN) 800 MG tablet Take 2 tablets by mouth 2 (two) times daily.     losartan (COZAAR) 25 MG tablet Take 4 tabs every day    metoprolol tartrate (LOPRESSOR) 50 MG tablet Take 1.5 tablets (75 mg total) by mouth 2 (two) times daily.    NONFORMULARY OR COMPOUNDED ITEM See pharmacy note    ondansetron (ZOFRAN) 8 MG tablet Take 1 tablet (8 mg total) by mouth 2 (two) times daily as needed for nausea or vomiting.    oxyCODONE (OXY IR/ROXICODONE) 5 MG immediate release tablet oxycodone 5 mg tablet  TAKE 1 TABLET BY MOUTH EVERY 6 HOURS AS NEEDED FOR PAIN    pantoprazole (PROTONIX) 40 MG tablet TAKE 1 TABLET(40 MG) BY MOUTH DAILY    pyridOXINE (VITAMIN B-6) 100 MG tablet Take 500 mg by mouth at bedtime.     sertraline (ZOLOFT) 100 MG tablet Take 200 mg by mouth daily.     vitamin B-12 (CYANOCOBALAMIN) 1000 MCG tablet Take 1,000 mcg by mouth at bedtime.    No facility-administered encounter medications on file as of 07/21/2021.   Marland KitchenReviewed chart prior to disease state call. Spoke with patient regarding BP  Recent Office Vitals: BP Readings from Last 3 Encounters:  07/02/21 (!) 164/92  06/29/21 124/72  06/08/21 (!) 146/80   Pulse Readings  from Last 3 Encounters:  07/02/21 100  06/29/21 88  06/08/21 80    Wt Readings from Last 3 Encounters:  07/02/21 (!) 338 lb 12.8 oz (153.7 kg)  06/29/21 (!) 344 lb 9.6 oz (156.3 kg)  06/08/21 (!) 355 lb 12.8 oz (161.4 kg)     Kidney Function Lab Results  Component Value Date/Time   CREATININE 0.97 04/07/2021 10:45 AM   CREATININE 1.06 04/27/2020 09:44 AM   CREATININE 1.03 05/15/2013 06:29 PM   GFR 73.96 09/21/2018 04:29 PM   GFRNONAA 80 04/27/2020 09:44 AM   GFRNONAA >60 05/15/2013 06:29 PM   GFRAA 92 04/27/2020 09:44 AM   GFRAA >60 05/15/2013 06:29 PM    BMP Latest Ref Rng & Units 04/07/2021 06/11/2020 05/07/2020  Glucose 65 - 99  mg/dL 99 - -  BUN 6 - 24 mg/dL 11 - -  Creatinine 0.76 - 1.27 mg/dL 0.97 - -  BUN/Creat Ratio 9 - 20 11 - -  Sodium 134 - 144 mmol/L 134 132(L) 134  Potassium 3.5 - 5.2 mmol/L 4.4 - -  Chloride 96 - 106 mmol/L 94(L) - -  CO2 20 - 29 mmol/L 25 - -  Calcium 8.7 - 10.2 mg/dL 9.1 - -   Current antihypertensive regimen:  Losartan 25 mg 1 tablet daily Amlodipine 5 mg 1 tablet daily  Metoprolol Tartrate 50 mg 1 tablet daily ?  What recent interventions/DTPs have been made by any provider to improve Blood Pressure control since last CPP Visit: Yes   Any recent hospitalizations or ED visits since last visit with CPP? Per chart no.   Adherence Review: Is the patient currently on ACE/ARB medication? Losartan 25 mg  Does the patient have >5 day gap between last estimated fill dates? Per misc rpts, yes.   Star Rating Drugs: Losartan 25 mg 01/01/21 90 DS, Atorvastatin 10 mg   Third unsuccessful telephone outreach was attempted today. The patient was referred to the pharmacist for assistance with care management and care coordination.   Follow-Up:Pharmacist Review  Charlann Lange, [place] Pharmacist Assistant [phone]

## 2021-07-23 ENCOUNTER — Telehealth: Payer: Self-pay

## 2021-07-23 NOTE — Telephone Encounter (Signed)
Cpap/Bipap written supply order signed by provider and faxed back to [facility] at [phone].

## 2021-07-28 ENCOUNTER — Telehealth: Payer: Self-pay

## 2021-07-28 NOTE — Telephone Encounter (Signed)
CPAP order signed & faxed back to [facility] supply [phone]. Sent to be scanned-Toni

## 2021-08-04 DIAGNOSIS — I10 Essential (primary) hypertension: Secondary | ICD-10-CM | POA: Diagnosis not present

## 2021-08-04 DIAGNOSIS — E782 Mixed hyperlipidemia: Secondary | ICD-10-CM | POA: Diagnosis not present

## 2021-08-20 DIAGNOSIS — G4733 Obstructive sleep apnea (adult) (pediatric): Secondary | ICD-10-CM | POA: Diagnosis not present

## 2021-08-20 DIAGNOSIS — G4731 Primary central sleep apnea: Secondary | ICD-10-CM | POA: Diagnosis not present

## 2021-08-24 ENCOUNTER — Telehealth: Payer: Self-pay

## 2021-09-19 DIAGNOSIS — G4733 Obstructive sleep apnea (adult) (pediatric): Secondary | ICD-10-CM | POA: Diagnosis not present

## 2021-09-19 DIAGNOSIS — G4731 Primary central sleep apnea: Secondary | ICD-10-CM | POA: Diagnosis not present

## 2021-09-29 ENCOUNTER — Ambulatory Visit: Payer: Medicare HMO | Admitting: Nurse Practitioner

## 2021-10-12 ENCOUNTER — Telehealth: Payer: Self-pay | Admitting: Student-PharmD

## 2021-10-12 NOTE — Progress Notes (Addendum)
  Chronic Care Management Pharmacy Assistant   Name: Ural Acree.  MRN: [id number] DOB: [birth year]   Reason for Encounter: General Disease State Call   Conditions to be addressed/monitored: HTN, DDD/Arthritis, HLD,Anxiety/Depression,Pre-Diabetes, OCD  Recent office visits:  08/04/21 Dr. Humphrey Rolls For hypertension and hyperlipidemia No more information given.  Recent consult visits:  None since last attempt on 07/21/21  Hospital visits:  None since last attempt on 07/21/21  Medications: Outpatient Encounter Medications as of 10/12/2021  Medication Sig Note   ALPRAZolam (XANAX) 0.5 MG tablet Take 0.5 mg by mouth 4 (four) times daily. By dr reddy 03/25/2014: .    amLODipine (NORVASC) 5 MG tablet Take one tab po bid for blood pressure    aspirin EC 81 MG tablet Take 81 mg by mouth at bedtime.     atorvastatin (LIPITOR) 10 MG tablet Take 1 tablet (10 mg total) by mouth at bedtime.    Betaine (CYSTADANE) POWD Take 8 Scoops by mouth 3 (three) times daily.    clotrimazole (LOTRIMIN) 1 % cream APPLY EXTERNALLY TO THE AFFECTED AREA TWICE DAILY    dicyclomine (BENTYL) 10 MG capsule Take 1 capsule (10 mg total) by mouth 4 (four) times daily -  before meals and at bedtime. *Please call to make a follow up appointment for June    DULoxetine (CYMBALTA) 60 MG capsule Take 60 mg by mouth daily.    fentaNYL (DURAGESIC) 12 MCG/HR Place 1 patch onto the skin every 3 (three) days.    Ferrous Sulfate Dried 45 MG TBCR Take 45 mg by mouth at bedtime.    folic acid (FOLVITE) 017 MCG tablet Take 1,200 mcg by mouth at bedtime.     furosemide (LASIX) 20 MG tablet TAKE 1 TABLET(20 MG) BY MOUTH DAILY    gabapentin (NEURONTIN) 800 MG tablet Take 2 tablets by mouth 2 (two) times daily.     losartan (COZAAR) 25 MG tablet Take 4 tabs every day    metoprolol tartrate (LOPRESSOR) 50 MG tablet Take 1.5 tablets (75 mg total) by mouth 2 (two) times daily.    NONFORMULARY OR COMPOUNDED ITEM See pharmacy note    ondansetron  (ZOFRAN) 8 MG tablet Take 1 tablet (8 mg total) by mouth 2 (two) times daily as needed for nausea or vomiting.    oxyCODONE (OXY IR/ROXICODONE) 5 MG immediate release tablet oxycodone 5 mg tablet  TAKE 1 TABLET BY MOUTH EVERY 6 HOURS AS NEEDED FOR PAIN    pantoprazole (PROTONIX) 40 MG tablet TAKE 1 TABLET(40 MG) BY MOUTH DAILY    pyridOXINE (VITAMIN B-6) 100 MG tablet Take 500 mg by mouth at bedtime.     sertraline (ZOLOFT) 100 MG tablet Take 200 mg by mouth daily.     vitamin B-12 (CYANOCOBALAMIN) 1000 MCG tablet Take 1,000 mcg by mouth at bedtime.    No facility-administered encounter medications on file as of 10/12/2021.   GEN CALL:  Third unsuccessful telephone outreach was attempted today. The patient was referred to the pharmacist for assistance with care management and care coordination.   Care Gaps:Patient is due for AWV. BP:164/92  Star Rating Drugs:Losartan 25 mg 01/01/21 90 DS, Atorvastatin 10 mg 10/12/20 90 DS.  Follow-Up:Pharmacist Review  Charlann Lange, RMA Clinical Pharmacist Assistant [phone]  5 minutes spent in review, coordination, and documentation. Patient is deceased and we will cease outreach attempts.  Reviewed by: Alena Bills, PharmD Clinical Pharmacist [phone]

## 2022-04-04 ENCOUNTER — Ambulatory Visit: Payer: Medicare HMO | Admitting: Nurse Practitioner
